# Patient Record
Sex: Female | Born: 1935 | Race: White | Hispanic: No | Smoking: Current every day smoker
Health system: Southern US, Community
[De-identification: ages and names within clinical notes are randomized; demographics above are authoritative.]

## PROBLEM LIST (undated history)

## (undated) DIAGNOSIS — F17200 Nicotine dependence, unspecified, uncomplicated: Secondary | ICD-10-CM

## (undated) DIAGNOSIS — I10 Essential (primary) hypertension: Secondary | ICD-10-CM

## (undated) DIAGNOSIS — Z8489 Family history of other specified conditions: Secondary | ICD-10-CM

## (undated) DIAGNOSIS — N6320 Unspecified lump in the left breast, unspecified quadrant: Secondary | ICD-10-CM

## (undated) DIAGNOSIS — C4492 Squamous cell carcinoma of skin, unspecified: Secondary | ICD-10-CM

## (undated) HISTORY — PX: APPENDECTOMY: SHX54

## (undated) HISTORY — PX: EYE SURGERY: SHX253

## (undated) HISTORY — PX: COLONOSCOPY: SHX174

## (undated) HISTORY — DX: Squamous cell carcinoma of skin, unspecified: C44.92

## (undated) HISTORY — PX: CAROTID ENDARTERECTOMY: SUR193

---

## 2004-11-29 ENCOUNTER — Ambulatory Visit: Payer: Self-pay | Admitting: Internal Medicine

## 2006-01-15 ENCOUNTER — Ambulatory Visit: Payer: Self-pay | Admitting: Internal Medicine

## 2007-02-04 ENCOUNTER — Ambulatory Visit: Payer: Self-pay | Admitting: Internal Medicine

## 2007-03-26 ENCOUNTER — Ambulatory Visit: Payer: Self-pay | Admitting: Unknown Physician Specialty

## 2008-02-08 ENCOUNTER — Ambulatory Visit: Payer: Self-pay | Admitting: Internal Medicine

## 2009-03-05 ENCOUNTER — Ambulatory Visit: Payer: Self-pay | Admitting: Internal Medicine

## 2009-06-20 ENCOUNTER — Ambulatory Visit: Payer: Self-pay | Admitting: Unknown Physician Specialty

## 2009-08-27 ENCOUNTER — Ambulatory Visit: Payer: Self-pay | Admitting: Surgery

## 2009-09-03 ENCOUNTER — Inpatient Hospital Stay: Payer: Self-pay | Admitting: Surgery

## 2009-09-05 LAB — PATHOLOGY REPORT

## 2010-01-11 ENCOUNTER — Ambulatory Visit: Payer: Self-pay | Admitting: Internal Medicine

## 2010-01-17 ENCOUNTER — Ambulatory Visit: Payer: Self-pay | Admitting: Vascular Surgery

## 2010-03-06 ENCOUNTER — Ambulatory Visit: Payer: Self-pay | Admitting: Internal Medicine

## 2010-04-15 ENCOUNTER — Ambulatory Visit: Payer: Self-pay | Admitting: Vascular Surgery

## 2010-04-18 ENCOUNTER — Inpatient Hospital Stay: Payer: Self-pay | Admitting: Vascular Surgery

## 2010-04-19 LAB — PATHOLOGY REPORT

## 2011-03-31 ENCOUNTER — Ambulatory Visit: Payer: Self-pay | Admitting: Internal Medicine

## 2011-06-06 ENCOUNTER — Ambulatory Visit: Payer: Self-pay | Admitting: Unknown Physician Specialty

## 2011-06-12 ENCOUNTER — Ambulatory Visit: Payer: Self-pay | Admitting: Unknown Physician Specialty

## 2011-06-17 ENCOUNTER — Ambulatory Visit: Payer: Self-pay | Admitting: Unknown Physician Specialty

## 2011-09-25 ENCOUNTER — Ambulatory Visit: Payer: Self-pay | Admitting: Unknown Physician Specialty

## 2011-09-26 LAB — PATHOLOGY REPORT

## 2011-11-11 ENCOUNTER — Ambulatory Visit: Payer: Self-pay | Admitting: Ophthalmology

## 2011-11-24 ENCOUNTER — Ambulatory Visit: Payer: Self-pay | Admitting: Ophthalmology

## 2012-03-31 ENCOUNTER — Ambulatory Visit: Payer: Self-pay | Admitting: Internal Medicine

## 2013-04-01 ENCOUNTER — Ambulatory Visit: Payer: Self-pay | Admitting: Family Medicine

## 2014-01-13 HISTORY — PX: BREAST EXCISIONAL BIOPSY: SUR124

## 2014-02-21 DIAGNOSIS — Z720 Tobacco use: Secondary | ICD-10-CM | POA: Insufficient documentation

## 2014-04-10 ENCOUNTER — Ambulatory Visit: Payer: Self-pay | Admitting: Family Medicine

## 2014-04-19 ENCOUNTER — Ambulatory Visit
Admit: 2014-04-19 | Disposition: A | Discharge status: Home / Self Care | Payer: Self-pay | Attending: Family Medicine | Admitting: Family Medicine

## 2014-04-20 ENCOUNTER — Other Ambulatory Visit: Payer: Self-pay | Admitting: Family Medicine

## 2014-04-20 DIAGNOSIS — N63 Unspecified lump in unspecified breast: Secondary | ICD-10-CM

## 2014-05-02 NOTE — Op Note (Signed)
PATIENT NAME:  Tina Ingram, Tina Ingram MR#:  [id number] DATE OF BIRTH:  1934/04/09  DATE OF PROCEDURE:  11/24/2011  PREOPERATIVE DIAGNOSIS:  Cataract, left eye.    POSTOPERATIVE DIAGNOSIS:  Cataract, left eye.  PROCEDURE PERFORMED:  Extracapsular cataract extraction using phacoemulsification with placement of an Alcon [id number], 24.0-diopter posterior chamber lens, serial # [id number].  SURGEON:  Loura Back. Nyzier Boivin, MD  ASSISTANT:  None.  ANESTHESIA:  4% lidocaine and 0.75% Marcaine in a 50/50 mixture with 10 units/mL of Hylenex added, given as peribulbar.  ANESTHESIOLOGIST:  Dr. Benjamine Mola   COMPLICATIONS:  None.  ESTIMATED BLOOD LOSS:  Less than 1 mL.  DESCRIPTION OF PROCEDURE:  The patient was brought to the operating room and given a peribulbar block.  The patient was then prepped and draped in the usual fashion.  The vertical rectus muscles were imbricated using 5-0 silk sutures.  These sutures were then clamped to the sterile drapes as bridle sutures.  A limbal peritomy was performed extending two clock hours and hemostasis was obtained with cautery.  A partial thickness scleral groove was made at the surgical limbus and dissected anteriorly in a lamellar dissection using an Alcon crescent knife.  The anterior chamber was entered supero-temporally with a Superblade and through the lamellar dissection with a 2.6 mm keratome.  DisCoVisc was used to replace the aqueous and a continuous tear capsulorrhexis was carried out.  Hydrodissection and hydrodelineation were carried out with balanced salt and a 27 gauge canula.  The nucleus was rotated to confirm the effectiveness of the hydrodissection.  Phacoemulsification was carried out using a divide-and-conquer technique.  Total ultrasound time was 1 minute and 35 seconds with an average power of 19.2 percent, CDE 31.39.  Irrigation/aspiration was used to remove the residual cortex.  DisCoVisc was used to inflate the capsule and the internal incision was  enlarged to 3 mm with the crescent knife.  The intraocular lens was folded and inserted into the capsular bag using the AcrySert delivery system.  Irrigation/aspiration was used to remove the residual DisCoVisc.  Miostat was injected into the anterior chamber through the paracentesis track to inflate the anterior chamber and induce miosis.  The wound was checked for leaks and none were found. The conjunctiva was closed with cautery and the bridle sutures were removed.  Two drops of 0.3% Vigamox were placed on the eye.   An eye shield was placed on the eye.  The patient was discharged to the recovery room in good condition.  ____________________________ Loura Back Lavayah Vita, MD sad:drc D: 11/24/2011 13:13:51 ET T: 11/24/2011 13:24:24 ET JOB#: [id number]  cc: Remo Lipps A. Dominigue Gellner, MD, <Dictator> Martie Lee MD ELECTRONICALLY SIGNED 12/08/2011 14:39

## 2014-05-11 ENCOUNTER — Other Ambulatory Visit: Payer: Self-pay | Admitting: Family Medicine

## 2014-05-11 DIAGNOSIS — N63 Unspecified lump in unspecified breast: Secondary | ICD-10-CM

## 2014-05-16 ENCOUNTER — Ambulatory Visit
Admission: RE | Admit: 2014-05-16 | Discharge: 2014-05-16 | Disposition: A | Discharge status: Home / Self Care | Payer: Medicare Other | Source: Ambulatory Visit | Attending: Family Medicine | Admitting: Family Medicine

## 2014-05-16 ENCOUNTER — Other Ambulatory Visit: Payer: Self-pay | Admitting: Family Medicine

## 2014-05-16 DIAGNOSIS — N63 Unspecified lump in unspecified breast: Secondary | ICD-10-CM

## 2014-05-16 HISTORY — PX: BREAST BIOPSY: SHX20

## 2014-05-22 ENCOUNTER — Ambulatory Visit: Payer: Self-pay | Admitting: Surgery

## 2014-05-22 DIAGNOSIS — N6320 Unspecified lump in the left breast, unspecified quadrant: Secondary | ICD-10-CM

## 2014-05-22 NOTE — H&P (Signed)
atsye S. Steinert 05/22/2014 10:28 AM Location: [facility] Patient #: [id number] DOB: January 24, 1934 Single / Language: Cleophus Molt / Race: White Female History of Present Illness Marcello Moores A. Trygg Mantz MD; 05/22/2014 11:50 AM) Patient words: check breast lesion  Pt sent at the request of Dr Owens Shark for left breast lesion picked up on mammogram. Lesion biopsy showed complex sclerosing lesion. No other breast problems except some burning. Has had one previous biopsy. no mass or nipple discharge from either side.    CLINICAL DATA: Status post tomosynthesis guided biopsy of left breast distortion.  EXAM: DIAGNOSTIC LEFT MAMMOGRAM POST ULTRASOUND BIOPSY  DIGITAL BREAST TOMOSYNTHESIS  COMPARISON: Previous exams  FINDINGS: Mammographic images were obtained following tomosynthesis guided biopsy of distortion in the upper inner quadrant of the left breast. An X shaped clip is identified in the area of distortion following biopsy.  IMPRESSION: Tissue marker clip is in expected location following biopsy.  Final Assessment: Post Procedure Mammograms for Marker Placement Pathology revealed a complex sclerosing lesion in the left breast. This was found to be concordant by Dr. Shon Hale. Pathology was discussed with the patient by telephone. She reported doing well after the biopsy with tenderness at the site. Post biopsy instructions and care were reviewed and her questions were answered. At the request of the patient, surgical consultation was arranged in [place]. She has been scheduled to see Dr. Erroll Luna at [facility] on May 22, 2014. My number was provided for additional concerns or questions.  Pathology results reported by Susa Raring RN, BSN on May 18, 2014.   Electronically Signed By: Nolon Nations M.D. On: 05/18/2014 08:03          Breast, left, needle core biopsy, upper inner quadrant - COMPLEX SCLEROSING LESION, SEE  COMMENT. - CALCIFICATIONS.  The patient is a 80 year old female   Other Problems Marjean Donna, [place]; 05/22/2014 10:28 AM) Back Pain High blood pressure  Past Surgical History Marjean Donna, CMA; 05/22/2014 10:28 AM) Appendectomy Breast Biopsy Left. Colon Polyp Removal - Colonoscopy Colon Polyp Removal - Open Oral Surgery  Diagnostic Studies History Marjean Donna, CMA; 05/22/2014 10:28 AM) Colonoscopy 1-5 years ago Mammogram within last year  Allergies Marjean Donna, [place]; 05/22/2014 10:29 AM) No Known Drug Allergies 05/22/2014  Medication History (Sonya Bynum, CMA; 05/22/2014 10:35 AM) Aspirin EC (81MG  Tablet DR, Oral) Active. Doxycycline Hyclate (100MG  Capsule, Oral) Active. Medications Reconciled  Social History Marjean Donna, CMA; 05/22/2014 10:28 AM) Caffeine use Coffee, Tea. No alcohol use No drug use Tobacco use Current every day smoker.  Family History Marjean Donna, CMA; 05/22/2014 10:28 AM) Cancer Father. Hypertension Father. Respiratory Condition Father.  Pregnancy / Birth History Marjean Donna, [place]; 05/22/2014 10:28 AM) Age at menarche 58 years. Gravida 2 Maternal age 17-25 Para 2     Review of Systems ([place]; 05/22/2014 10:28 AM) General Not Present- Appetite Loss, Chills, Fatigue, Fever, Night Sweats, Weight Gain and Weight Loss. Skin Not Present- Change in Wart/Mole, Dryness, Hives, Jaundice, New Lesions, Non-Healing Wounds, Rash and Ulcer. HEENT Not Present- Earache, Hearing Loss, Hoarseness, Nose Bleed, Oral Ulcers, Ringing in the Ears, Seasonal Allergies, Sinus Pain, Sore Throat, Visual Disturbances, Wears glasses/contact lenses and Yellow Eyes. Respiratory Not Present- Bloody sputum, Chronic Cough, Difficulty Breathing, Snoring and Wheezing. Breast Present- Breast Mass. Not Present- Breast Pain, Nipple Discharge and Skin Changes. Cardiovascular Not Present- Chest Pain, Difficulty Breathing Lying Down, Leg Cramps, Palpitations, Rapid  Heart Rate, Shortness of Breath and Swelling of Extremities. Gastrointestinal Not Present-  Abdominal Pain, Bloating, Bloody Stool, Change in Bowel Habits, Chronic diarrhea, Constipation, Difficulty Swallowing, Excessive gas, Gets full quickly at meals, Hemorrhoids, Indigestion, Nausea, Rectal Pain and Vomiting. Female Genitourinary Not Present- Frequency, Nocturia, Painful Urination, Pelvic Pain and Urgency. Musculoskeletal Present- Joint Stiffness. Not Present- Back Pain, Joint Pain, Muscle Pain, Muscle Weakness and Swelling of Extremities. Neurological Not Present- Decreased Memory, Fainting, Headaches, Numbness, Seizures, Tingling, Tremor, Trouble walking and Weakness. Psychiatric Not Present- Anxiety, Bipolar, Change in Sleep Pattern, Depression, Fearful and Frequent crying. Endocrine Not Present- Cold Intolerance, Excessive Hunger, Hair Changes, Heat Intolerance, Hot flashes and New Diabetes. Hematology Present- Easy Bruising. Not Present- Excessive bleeding, Gland problems, HIV and Persistent Infections.  Vitals (Sonya Bynum CMA; 05/22/2014 10:29 AM) 05/22/2014 10:28 AM Weight: 129.2 lb Height: 62in Body Surface Area: 1.6 m Body Mass Index: 23.63 kg/m Temp.: 32F(Temporal)  Pulse: 92 (Regular)  BP: 142/76 (Sitting, Left Arm, Standard)     Physical Exam (Jontae Adebayo A. Duke Weisensel MD; 05/22/2014 10:53 AM)  General Mental Status-Alert. General Appearance-Consistent with stated age. Hydration-Well hydrated. Voice-Normal.  Head and Neck Head-normocephalic, atraumatic with no lesions or palpable masses. Trachea-midline. Thyroid Gland Characteristics - normal size and consistency.  Chest and Lung Exam Chest and lung exam reveals -quiet, even and easy respiratory effort with no use of accessory muscles and on auscultation, normal breath sounds, no adventitious sounds and normal vocal resonance. Inspection Chest Wall - Normal. Back - normal.  Breast Note: bruising  left breast no mass  rigt breast normal   Cardiovascular Cardiovascular examination reveals -normal heart sounds, regular rate and rhythm with no murmurs and normal pedal pulses bilaterally.  Musculoskeletal Normal Exam - Left-Upper Extremity Strength Normal and Lower Extremity Strength Normal. Normal Exam - Right-Upper Extremity Strength Normal and Lower Extremity Strength Normal.  Lymphatic Axillary  General Axillary Region: Bilateral - Description - Normal. Tenderness - Non Tender.    Assessment & Plan (Saranda Legrande A. Felise Georgia MD; 05/22/2014 11:50 AM)  LEFT BREAST MASS (611.72  N63) Impression: sclerosing lesion carries small risk of malignancy. Discussed lumpectomy vs observation and risk benefit of each. She wishes to proceed with left breast seed localized lumpectomy. Risk of lumpectomy include bleeding, infection, seroma, more surgery, use of seed/wire, wound care, cosmetic deformity and the need for other treatments, death , blood clots, death. Pt agrees to proceed.  Current Plans Pt Education - [facility] Breast Biopsy [facility]

## 2014-05-25 ENCOUNTER — Other Ambulatory Visit: Payer: Self-pay | Admitting: Surgery

## 2014-05-25 DIAGNOSIS — N6320 Unspecified lump in the left breast, unspecified quadrant: Secondary | ICD-10-CM

## 2014-06-16 ENCOUNTER — Encounter (HOSPITAL_BASED_OUTPATIENT_CLINIC_OR_DEPARTMENT_OTHER): Payer: Self-pay | Admitting: *Deleted

## 2014-06-19 ENCOUNTER — Encounter (HOSPITAL_BASED_OUTPATIENT_CLINIC_OR_DEPARTMENT_OTHER)
Admission: RE | Admit: 2014-06-19 | Discharge: 2014-06-19 | Disposition: A | Discharge status: Home / Self Care | Payer: Medicare Other | Source: Ambulatory Visit | Attending: Surgery | Admitting: Surgery

## 2014-06-19 ENCOUNTER — Ambulatory Visit
Admission: RE | Admit: 2014-06-19 | Discharge: 2014-06-19 | Disposition: A | Discharge status: Home / Self Care | Payer: Medicare Other | Source: Ambulatory Visit | Attending: Surgery | Admitting: Surgery

## 2014-06-19 ENCOUNTER — Other Ambulatory Visit: Payer: Self-pay

## 2014-06-19 DIAGNOSIS — I10 Essential (primary) hypertension: Secondary | ICD-10-CM | POA: Diagnosis not present

## 2014-06-19 DIAGNOSIS — F17200 Nicotine dependence, unspecified, uncomplicated: Secondary | ICD-10-CM | POA: Diagnosis not present

## 2014-06-19 DIAGNOSIS — N6489 Other specified disorders of breast: Secondary | ICD-10-CM | POA: Diagnosis not present

## 2014-06-19 DIAGNOSIS — Z7982 Long term (current) use of aspirin: Secondary | ICD-10-CM | POA: Diagnosis not present

## 2014-06-19 DIAGNOSIS — N63 Unspecified lump in breast: Secondary | ICD-10-CM | POA: Diagnosis present

## 2014-06-19 DIAGNOSIS — Z79899 Other long term (current) drug therapy: Secondary | ICD-10-CM | POA: Diagnosis not present

## 2014-06-19 DIAGNOSIS — I739 Peripheral vascular disease, unspecified: Secondary | ICD-10-CM | POA: Diagnosis not present

## 2014-06-19 DIAGNOSIS — N6320 Unspecified lump in the left breast, unspecified quadrant: Secondary | ICD-10-CM

## 2014-06-19 LAB — COMPREHENSIVE METABOLIC PANEL
ALK PHOS: 69 U/L (ref 38–126)
ALT: 11 U/L — AB (ref 14–54)
AST: 17 U/L (ref 15–41)
Albumin: 3.7 g/dL (ref 3.5–5.0)
Anion gap: 11 (ref 5–15)
BUN: 14 mg/dL (ref 6–20)
CO2: 29 mmol/L (ref 22–32)
Calcium: 9.2 mg/dL (ref 8.9–10.3)
Chloride: 98 mmol/L — ABNORMAL LOW (ref 101–111)
Creatinine, Ser: 1.03 mg/dL — ABNORMAL HIGH (ref 0.44–1.00)
GFR, EST AFRICAN AMERICAN: 59 mL/min — AB (ref >60)
GFR, EST NON AFRICAN AMERICAN: 51 mL/min — AB (ref >60)
GLUCOSE: 112 mg/dL — AB (ref 65–99)
Potassium: 3.1 mmol/L — ABNORMAL LOW (ref 3.5–5.1)
SODIUM: 138 mmol/L (ref 135–145)
TOTAL PROTEIN: 6.2 g/dL — AB (ref 6.5–8.1)
Total Bilirubin: 1.3 mg/dL — ABNORMAL HIGH (ref 0.3–1.2)

## 2014-06-19 LAB — CBC WITH DIFFERENTIAL/PLATELET
BASOS ABS: 0 10*3/uL (ref 0.0–0.1)
Basophils Relative: 0 % (ref 0–1)
EOS ABS: 0.1 10*3/uL (ref 0.0–0.7)
EOS PCT: 2 % (ref 0–5)
HEMATOCRIT: 43.5 % (ref 36.0–46.0)
Hemoglobin: 14.5 g/dL (ref 12.0–15.0)
Lymphocytes Relative: 21 % (ref 12–46)
Lymphs Abs: 1.5 10*3/uL (ref 0.7–4.0)
MCH: 30.4 pg (ref 26.0–34.0)
MCHC: 33.3 g/dL (ref 30.0–36.0)
MCV: 91.2 fL (ref 78.0–100.0)
Monocytes Absolute: 0.4 10*3/uL (ref 0.1–1.0)
Monocytes Relative: 5 % (ref 3–12)
NEUTROS PCT: 72 % (ref 43–77)
Neutro Abs: 5.2 10*3/uL (ref 1.7–7.7)
Platelets: 265 10*3/uL (ref 150–400)
RBC: 4.77 MIL/uL (ref 3.87–5.11)
RDW: 13.1 % (ref 11.5–15.5)
WBC: 7.2 10*3/uL (ref 4.0–10.5)

## 2014-06-20 ENCOUNTER — Ambulatory Visit (HOSPITAL_BASED_OUTPATIENT_CLINIC_OR_DEPARTMENT_OTHER): Payer: Medicare Other | Admitting: Anesthesiology

## 2014-06-20 ENCOUNTER — Encounter (HOSPITAL_BASED_OUTPATIENT_CLINIC_OR_DEPARTMENT_OTHER): Payer: Self-pay | Admitting: Anesthesiology

## 2014-06-20 ENCOUNTER — Encounter (HOSPITAL_BASED_OUTPATIENT_CLINIC_OR_DEPARTMENT_OTHER)
Admission: RE | Disposition: A | Discharge status: Home / Self Care | Payer: Self-pay | Source: Ambulatory Visit | Attending: Surgery

## 2014-06-20 ENCOUNTER — Ambulatory Visit (HOSPITAL_BASED_OUTPATIENT_CLINIC_OR_DEPARTMENT_OTHER)
Admission: RE | Admit: 2014-06-20 | Discharge: 2014-06-20 | Disposition: A | Discharge status: Home / Self Care | Payer: Medicare Other | Source: Ambulatory Visit | Attending: Surgery | Admitting: Surgery

## 2014-06-20 ENCOUNTER — Ambulatory Visit
Admission: RE | Admit: 2014-06-20 | Discharge: 2014-06-20 | Disposition: A | Discharge status: Home / Self Care | Payer: Medicare Other | Source: Ambulatory Visit | Attending: Surgery | Admitting: Surgery

## 2014-06-20 DIAGNOSIS — I10 Essential (primary) hypertension: Secondary | ICD-10-CM | POA: Insufficient documentation

## 2014-06-20 DIAGNOSIS — F17200 Nicotine dependence, unspecified, uncomplicated: Secondary | ICD-10-CM | POA: Insufficient documentation

## 2014-06-20 DIAGNOSIS — N6489 Other specified disorders of breast: Secondary | ICD-10-CM | POA: Insufficient documentation

## 2014-06-20 DIAGNOSIS — Z79899 Other long term (current) drug therapy: Secondary | ICD-10-CM | POA: Insufficient documentation

## 2014-06-20 DIAGNOSIS — Z7982 Long term (current) use of aspirin: Secondary | ICD-10-CM | POA: Insufficient documentation

## 2014-06-20 DIAGNOSIS — I739 Peripheral vascular disease, unspecified: Secondary | ICD-10-CM | POA: Diagnosis not present

## 2014-06-20 DIAGNOSIS — N6320 Unspecified lump in the left breast, unspecified quadrant: Secondary | ICD-10-CM

## 2014-06-20 HISTORY — PX: BREAST LUMPECTOMY WITH RADIOACTIVE SEED LOCALIZATION: SHX6424

## 2014-06-20 HISTORY — DX: Essential (primary) hypertension: I10

## 2014-06-20 HISTORY — DX: Unspecified lump in the left breast, unspecified quadrant: N63.20

## 2014-06-20 HISTORY — DX: Nicotine dependence, unspecified, uncomplicated: F17.200

## 2014-06-20 SURGERY — BREAST LUMPECTOMY WITH RADIOACTIVE SEED LOCALIZATION
Anesthesia: General | Laterality: Left

## 2014-06-20 MED ORDER — OXYCODONE HCL 5 MG PO TABS: 5.0000 mg | ORAL_TABLET | Freq: Once | ORAL | Status: DC | PRN

## 2014-06-20 MED ORDER — HYDROCODONE-ACETAMINOPHEN 5-325 MG PO TABS: 1.0000 | ORAL_TABLET | Freq: Four times a day (QID) | ORAL | Status: DC | PRN

## 2014-06-20 MED ORDER — BUPIVACAINE-EPINEPHRINE (PF) 0.25% -1:200000 IJ SOLN
INTRAMUSCULAR | Status: DC | PRN
  Administered 2014-06-20: 9 mL

## 2014-06-20 MED ORDER — GLYCOPYRROLATE 0.2 MG/ML IJ SOLN: 0.2000 mg | Freq: Once | INTRAMUSCULAR | Status: DC | PRN

## 2014-06-20 MED ORDER — CEFAZOLIN SODIUM-DEXTROSE 2-3 GM-% IV SOLR
2.0000 g | INTRAVENOUS | Status: AC
  Administered 2014-06-20: 2 g via INTRAVENOUS

## 2014-06-20 MED ORDER — PROPOFOL 10 MG/ML IV BOLUS
INTRAVENOUS | Status: DC | PRN
  Administered 2014-06-20: 150 mg via INTRAVENOUS

## 2014-06-20 MED ORDER — MIDAZOLAM HCL 2 MG/2ML IJ SOLN: 1.0000 mg | INTRAMUSCULAR | Status: DC | PRN

## 2014-06-20 MED ORDER — CHLORHEXIDINE GLUCONATE 4 % EX LIQD: 1.0000 "application " | Freq: Once | CUTANEOUS | Status: DC

## 2014-06-20 MED ORDER — DEXAMETHASONE SODIUM PHOSPHATE 4 MG/ML IJ SOLN
INTRAMUSCULAR | Status: DC | PRN
  Administered 2014-06-20: 10 mg via INTRAVENOUS

## 2014-06-20 MED ORDER — OXYCODONE HCL 5 MG/5ML PO SOLN: 5.0000 mg | Freq: Once | ORAL | Status: DC | PRN

## 2014-06-20 MED ORDER — MIDAZOLAM HCL 2 MG/2ML IJ SOLN
INTRAMUSCULAR | Status: AC
  Filled 2014-06-20: qty 2

## 2014-06-20 MED ORDER — EPHEDRINE SULFATE 50 MG/ML IJ SOLN
INTRAMUSCULAR | Status: DC | PRN
  Administered 2014-06-20 (×2): 10 mg via INTRAVENOUS

## 2014-06-20 MED ORDER — FENTANYL CITRATE (PF) 100 MCG/2ML IJ SOLN: 25.0000 ug | INTRAMUSCULAR | Status: DC | PRN

## 2014-06-20 MED ORDER — CEFAZOLIN SODIUM-DEXTROSE 2-3 GM-% IV SOLR
INTRAVENOUS | Status: AC
  Filled 2014-06-20: qty 50

## 2014-06-20 MED ORDER — FENTANYL CITRATE (PF) 100 MCG/2ML IJ SOLN
INTRAMUSCULAR | Status: AC
  Filled 2014-06-20: qty 4

## 2014-06-20 MED ORDER — FENTANYL CITRATE (PF) 100 MCG/2ML IJ SOLN: 50.0000 ug | INTRAMUSCULAR | Status: DC | PRN

## 2014-06-20 MED ORDER — ONDANSETRON HCL 4 MG/2ML IJ SOLN: 4.0000 mg | Freq: Four times a day (QID) | INTRAMUSCULAR | Status: DC | PRN

## 2014-06-20 MED ORDER — FENTANYL CITRATE (PF) 100 MCG/2ML IJ SOLN
INTRAMUSCULAR | Status: DC | PRN
  Administered 2014-06-20: 100 ug via INTRAVENOUS

## 2014-06-20 MED ORDER — LACTATED RINGERS IV SOLN
INTRAVENOUS | Status: DC
Start: 2014-06-20 — End: 2014-06-20
  Administered 2014-06-20 (×2): via INTRAVENOUS

## 2014-06-20 MED ORDER — ONDANSETRON HCL 4 MG/2ML IJ SOLN
INTRAMUSCULAR | Status: DC | PRN
  Administered 2014-06-20: 4 mg via INTRAVENOUS

## 2014-06-20 MED ORDER — LIDOCAINE HCL (CARDIAC) 20 MG/ML IV SOLN
INTRAVENOUS | Status: DC | PRN
  Administered 2014-06-20: 60 mg via INTRAVENOUS

## 2014-06-20 SURGICAL SUPPLY — 48 items
APPLIER CLIP 9.375 MED OPEN (MISCELLANEOUS)
BINDER BREAST LRG (GAUZE/BANDAGES/DRESSINGS) IMPLANT
BINDER BREAST MEDIUM (GAUZE/BANDAGES/DRESSINGS) IMPLANT
BINDER BREAST XLRG (GAUZE/BANDAGES/DRESSINGS) IMPLANT
BINDER BREAST XXLRG (GAUZE/BANDAGES/DRESSINGS) IMPLANT
BLADE SURG 15 STRL LF DISP TIS (BLADE) ×1 IMPLANT
BLADE SURG 15 STRL SS (BLADE) ×1
CANISTER SUC SOCK COL 7IN (MISCELLANEOUS) IMPLANT
CANISTER SUCT 1200ML W/VALVE (MISCELLANEOUS) IMPLANT
CHLORAPREP W/TINT 26ML (MISCELLANEOUS) ×2 IMPLANT
CLIP APPLIE 9.375 MED OPEN (MISCELLANEOUS) IMPLANT
CLIP TI WIDE RED SMALL 6 (CLIP) IMPLANT
COVER BACK TABLE 60X90IN (DRAPES) ×2 IMPLANT
COVER MAYO STAND STRL (DRAPES) ×2 IMPLANT
COVER PROBE W GEL 5X96 (DRAPES) ×2 IMPLANT
DECANTER SPIKE VIAL GLASS SM (MISCELLANEOUS) IMPLANT
DEVICE DUBIN W/COMP PLATE 8390 (MISCELLANEOUS) ×2 IMPLANT
DRAPE LAPAROSCOPIC ABDOMINAL (DRAPES) IMPLANT
DRAPE LAPAROTOMY 100X72 PEDS (DRAPES) ×2 IMPLANT
DRAPE UTILITY XL STRL (DRAPES) ×2 IMPLANT
ELECT COATED BLADE 2.86 ST (ELECTRODE) ×2 IMPLANT
ELECT REM PT RETURN 9FT ADLT (ELECTROSURGICAL) ×2
ELECTRODE REM PT RTRN 9FT ADLT (ELECTROSURGICAL) ×1 IMPLANT
GLOVE BIOGEL PI IND STRL 6.5 (GLOVE) ×2 IMPLANT
GLOVE BIOGEL PI IND STRL 8 (GLOVE) ×1 IMPLANT
GLOVE BIOGEL PI INDICATOR 6.5 (GLOVE) ×2
GLOVE BIOGEL PI INDICATOR 8 (GLOVE) ×1
GLOVE ECLIPSE 6.5 STRL STRAW (GLOVE) ×2 IMPLANT
GLOVE ECLIPSE 8.0 STRL XLNG CF (GLOVE) ×2 IMPLANT
GOWN STRL REUS W/ TWL LRG LVL3 (GOWN DISPOSABLE) ×2 IMPLANT
GOWN STRL REUS W/TWL LRG LVL3 (GOWN DISPOSABLE) ×2
HEMOSTAT SNOW SURGICEL 2X4 (HEMOSTASIS) IMPLANT
KIT MARKER MARGIN INK (KITS) ×2 IMPLANT
LIQUID BAND (GAUZE/BANDAGES/DRESSINGS) ×2 IMPLANT
NEEDLE HYPO 25X1 1.5 SAFETY (NEEDLE) ×2 IMPLANT
NS IRRIG 1000ML POUR BTL (IV SOLUTION) IMPLANT
PACK BASIN DAY SURGERY FS (CUSTOM PROCEDURE TRAY) ×2 IMPLANT
PENCIL BUTTON HOLSTER BLD 10FT (ELECTRODE) ×2 IMPLANT
SLEEVE SCD COMPRESS KNEE MED (MISCELLANEOUS) ×2 IMPLANT
SPONGE LAP 4X18 X RAY DECT (DISPOSABLE) ×2 IMPLANT
SUT MNCRL AB 4-0 PS2 18 (SUTURE) ×2 IMPLANT
SUT SILK 2 0 SH (SUTURE) IMPLANT
SUT VICRYL 3-0 CR8 SH (SUTURE) ×2 IMPLANT
SYR CONTROL 10ML LL (SYRINGE) ×2 IMPLANT
TOWEL OR 17X24 6PK STRL BLUE (TOWEL DISPOSABLE) ×2 IMPLANT
TOWEL OR NON WOVEN STRL DISP B (DISPOSABLE) ×2 IMPLANT
TUBE CONNECTING 20X1/4 (TUBING) IMPLANT
YANKAUER SUCT BULB TIP NO VENT (SUCTIONS) IMPLANT

## 2014-06-20 NOTE — H&P (View-Only) (Signed)
Tina Ingram 05/22/2014 10:28 AM Location: [facility] Patient #: [id number] DOB: [birth year] Single / Language: Cleophus Molt / Race: White Female History of Present Illness Marcello Moores A. Kameria Canizares MD; 05/22/2014 11:50 AM) Patient words: check breast lesion  Pt sent at the request of Dr Owens Shark for left breast lesion picked up on mammogram. Lesion biopsy showed complex sclerosing lesion. No other breast problems except some burning. Has had one previous biopsy. no mass or nipple discharge from either side.    CLINICAL DATA: Status post tomosynthesis guided biopsy of left breast distortion.  EXAM: DIAGNOSTIC LEFT MAMMOGRAM POST ULTRASOUND BIOPSY  DIGITAL BREAST TOMOSYNTHESIS  COMPARISON: Previous exams  FINDINGS: Mammographic images were obtained following tomosynthesis guided biopsy of distortion in the upper inner quadrant of the left breast. An X shaped clip is identified in the area of distortion following biopsy.  IMPRESSION: Tissue marker clip is in expected location following biopsy.  Final Assessment: Post Procedure Mammograms for Marker Placement Pathology revealed a complex sclerosing lesion in the left breast. This was found to be concordant by Dr. Shon Hale. Pathology was discussed with the patient by telephone. She reported doing well after the biopsy with tenderness at the site. Post biopsy instructions and care were reviewed and her questions were answered. At the request of the patient, surgical consultation was arranged in [place]. She has been scheduled to see Dr. Erroll Luna at [facility] on May 22, 2014. My number was provided for additional concerns or questions.  Pathology results reported by Susa Raring RN, BSN on May 18, 2014.   Electronically Signed By: Nolon Nations M.D. On: 05/18/2014 08:03          Breast, left, needle core biopsy, upper inner quadrant - COMPLEX SCLEROSING LESION, SEE  COMMENT. - CALCIFICATIONS.  The patient is a 80 year old female   Other Problems Marjean Donna, [place]; 05/22/2014 10:28 AM) Back Pain High blood pressure  Past Surgical History Marjean Donna, CMA; 05/22/2014 10:28 AM) Appendectomy Breast Biopsy Left. Colon Polyp Removal - Colonoscopy Colon Polyp Removal - Open Oral Surgery  Diagnostic Studies History Marjean Donna, CMA; 05/22/2014 10:28 AM) Colonoscopy 1-5 years ago Mammogram within last year  Allergies Marjean Donna, [place]; 05/22/2014 10:29 AM) No Known Drug Allergies 05/22/2014  Medication History (Sonya Bynum, CMA; 05/22/2014 10:35 AM) Aspirin EC (81MG  Tablet DR, Oral) Active. Doxycycline Hyclate (100MG  Capsule, Oral) Active. Medications Reconciled  Social History Marjean Donna, CMA; 05/22/2014 10:28 AM) Caffeine use Coffee, Tea. No alcohol use No drug use Tobacco use Current every day smoker.  Family History Marjean Donna, CMA; 05/22/2014 10:28 AM) Cancer Father. Hypertension Father. Respiratory Condition Father.  Pregnancy / Birth History Marjean Donna, [place]; 05/22/2014 10:28 AM) Age at menarche 68 years. Gravida 2 Maternal age 20-25 Para 2     Review of Systems ([place]; 05/22/2014 10:28 AM) General Not Present- Appetite Loss, Chills, Fatigue, Fever, Night Sweats, Weight Gain and Weight Loss. Skin Not Present- Change in Wart/Mole, Dryness, Hives, Jaundice, New Lesions, Non-Healing Wounds, Rash and Ulcer. HEENT Not Present- Earache, Hearing Loss, Hoarseness, Nose Bleed, Oral Ulcers, Ringing in the Ears, Seasonal Allergies, Sinus Pain, Sore Throat, Visual Disturbances, Wears glasses/contact lenses and Yellow Eyes. Respiratory Not Present- Bloody sputum, Chronic Cough, Difficulty Breathing, Snoring and Wheezing. Breast Present- Breast Mass. Not Present- Breast Pain, Nipple Discharge and Skin Changes. Cardiovascular Not Present- Chest Pain, Difficulty Breathing Lying Down, Leg Cramps, Palpitations, Rapid  Heart Rate, Shortness of Breath and Swelling of Extremities. Gastrointestinal Not Present-  Abdominal Pain, Bloating, Bloody Stool, Change in Bowel Habits, Chronic diarrhea, Constipation, Difficulty Swallowing, Excessive gas, Gets full quickly at meals, Hemorrhoids, Indigestion, Nausea, Rectal Pain and Vomiting. Female Genitourinary Not Present- Frequency, Nocturia, Painful Urination, Pelvic Pain and Urgency. Musculoskeletal Present- Joint Stiffness. Not Present- Back Pain, Joint Pain, Muscle Pain, Muscle Weakness and Swelling of Extremities. Neurological Not Present- Decreased Memory, Fainting, Headaches, Numbness, Seizures, Tingling, Tremor, Trouble walking and Weakness. Psychiatric Not Present- Anxiety, Bipolar, Change in Sleep Pattern, Depression, Fearful and Frequent crying. Endocrine Not Present- Cold Intolerance, Excessive Hunger, Hair Changes, Heat Intolerance, Hot flashes and New Diabetes. Hematology Present- Easy Bruising. Not Present- Excessive bleeding, Gland problems, HIV and Persistent Infections.  Vitals (Sonya Bynum CMA; 05/22/2014 10:29 AM) 05/22/2014 10:28 AM Weight: 129.2 lb Height: 62in Body Surface Area: 1.6 m Body Mass Index: 23.63 kg/m Temp.: 20F(Temporal)  Pulse: 92 (Regular)  BP: 142/76 (Sitting, Left Arm, Standard)     Physical Exam (Will Schier A. Rennee Coyne MD; 05/22/2014 10:53 AM)  General Mental Status-Alert. General Appearance-Consistent with stated age. Hydration-Well hydrated. Voice-Normal.  Head and Neck Head-normocephalic, atraumatic with no lesions or palpable masses. Trachea-midline. Thyroid Gland Characteristics - normal size and consistency.  Chest and Lung Exam Chest and lung exam reveals -quiet, even and easy respiratory effort with no use of accessory muscles and on auscultation, normal breath sounds, no adventitious sounds and normal vocal resonance. Inspection Chest Wall - Normal. Back - normal.  Breast Note: bruising  left breast no mass  rigt breast normal   Cardiovascular Cardiovascular examination reveals -normal heart sounds, regular rate and rhythm with no murmurs and normal pedal pulses bilaterally.  Musculoskeletal Normal Exam - Left-Upper Extremity Strength Normal and Lower Extremity Strength Normal. Normal Exam - Right-Upper Extremity Strength Normal and Lower Extremity Strength Normal.  Lymphatic Axillary  General Axillary Region: Bilateral - Description - Normal. Tenderness - Non Tender.    Assessment & Plan (Shalena Ezzell A. Alfonso Shackett MD; 05/22/2014 11:50 AM)  LEFT BREAST MASS (611.72  N63) Impression: sclerosing lesion carries small risk of malignancy. Discussed lumpectomy vs observation and risk benefit of each. She wishes to proceed with left breast seed localized lumpectomy. Risk of lumpectomy include bleeding, infection, seroma, more surgery, use of seed/wire, wound care, cosmetic deformity and the need for other treatments, death , blood clots, death. Pt agrees to proceed.  Current Plans Pt Education - [facility] Breast Biopsy [facility]

## 2014-06-20 NOTE — Op Note (Signed)
Peoperative diagnosis: Left breast SCLEROSING LESION  Postoperative diagnosis: Same   Procedure: Left breast seed localized lumpectomy  Surgeon: Erroll Luna M.D.  Anesthesia: Gen. With 0.25% Sensorcaine local  EBL: 20 cc  Specimen: Left breast tissue with clip and radioactive seed in the specimen. Verified with neoprobe and radiographic image showing both seed and clip in specimen  Indications for procedure: The patient presents for left breast  lumpectomy after core biopsy showed SCLEROSING LESION . Discussed the rationale for considering excision. Small risk of malignancy associated with this  lesion after core biopsy. Discussed observation. Discussed wire localization. Patient desired excision of left breast sclerosing lesion.The procedure has been discussed with the patient. Alternatives to surgery have been discussed with the patient.  Risks of surgery include bleeding,  Infection,  Seroma formation, death,  and the need for further surgery.   The patient understands and wishes to proceed.   Description of procedure: Patient underwent seed placement as an outpatient. Patient presents today for left breast seed localized lumpectomy. Patient and holding area. Questions are answered and neoprobe used to verify seed location. Patient taken back to the operating room and placed upon the OR table. After induction of general anesthesia, left breast prepped and draped in a sterile fashion. Timeout was done to verify proper side and procedure. Neoprobe used and hot spot identified and left breast central regiont. This was marked with pen. Curvilinear incision made left upper nipple of the breast. Dissection used with the help of a neoprobe around the tissue where the seed and clip were located. Tissue removed in its entirety with gross negative  margins.. Neoprobe used and seed within specimen. Radiographs taken which show clip and seed  In specimen.hemostasis achieved and cavity closed with 3-0  Vicryl and 4-0 Monocryl. Specimen oriented.  Dermabond applied. All final counts found to be correct. Specimen transported to pathology. Patient awoke extubated taken to recovery in satisfactory condition.

## 2014-06-20 NOTE — Anesthesia Preprocedure Evaluation (Signed)
Anesthesia Evaluation  Patient identified by MRN, date of birth, ID band Patient awake    Reviewed: Allergy & Precautions, NPO status , Patient's Chart, lab work & pertinent test results  Airway Mallampati: II   Neck ROM: full    Dental   Pulmonary Current Smoker,  breath sounds clear to auscultation        Cardiovascular hypertension, + Peripheral Vascular Disease Rhythm:regular Rate:Normal     Neuro/Psych    GI/Hepatic   Endo/Other    Renal/GU      Musculoskeletal   Abdominal   Peds  Hematology   Anesthesia Other Findings   Reproductive/Obstetrics                             Anesthesia Physical Anesthesia Plan  ASA: II  Anesthesia Plan: General   Post-op Pain Management:    Induction: Intravenous  Airway Management Planned: LMA  Additional Equipment:   Intra-op Plan:   Post-operative Plan:   Informed Consent: I have reviewed the patients History and Physical, chart, labs and discussed the procedure including the risks, benefits and alternatives for the proposed anesthesia with the patient or authorized representative who has indicated his/her understanding and acceptance.     Plan Discussed with: CRNA, Anesthesiologist and Surgeon  Anesthesia Plan Comments:         Anesthesia Quick Evaluation

## 2014-06-20 NOTE — Interval H&P Note (Signed)
History and Physical Interval Note:  06/20/2014 12:52 PM  Tina Ingram  has presented today for surgery, with the diagnosis of Left Breast Mass  The various methods of treatment have been discussed with the patient and family. After consideration of risks, benefits and other options for treatment, the patient has consented to  Procedure(s): BREAST LUMPECTOMY WITH RADIOACTIVE SEED LOCALIZATION (Left) as a surgical intervention .  The patient's history has been reviewed, patient examined, no change in status, stable for surgery.  I have reviewed the patient's chart and labs.  Questions were answered to the patient's satisfaction.     Latandra Loureiro A.

## 2014-06-20 NOTE — Anesthesia Procedure Notes (Signed)
Procedure Name: LMA Insertion Date/Time: 06/20/2014 1:02 PM Performed by: Lyndee Leo Pre-anesthesia Checklist: Patient identified, Emergency Drugs available, Suction available and Patient being monitored Patient Re-evaluated:Patient Re-evaluated prior to inductionOxygen Delivery Method: Circle System Utilized Preoxygenation: Pre-oxygenation with 100% oxygen Intubation Type: IV induction Ventilation: Mask ventilation without difficulty LMA: LMA inserted LMA Size: 4.0 Number of attempts: 1 Airway Equipment and Method: Bite block Placement Confirmation: positive ETCO2 Tube secured with: Tape Dental Injury: Teeth and Oropharynx as per pre-operative assessment

## 2014-06-20 NOTE — Anesthesia Postprocedure Evaluation (Signed)
Anesthesia Post Note  Patient: Tina Ingram  Procedure(s) Performed: Procedure(s) (LRB): BREAST LUMPECTOMY WITH RADIOACTIVE SEED LOCALIZATION (Left)  Anesthesia type: General  Patient location: PACU  Post pain: Pain level controlled and Adequate analgesia  Post assessment: Post-op Vital signs reviewed, Patient's Cardiovascular Status Stable, Respiratory Function Stable, Patent Airway and Pain level controlled  Last Vitals:  Filed Vitals:   06/20/14 1442  BP: 136/55  Pulse: 88  Temp: 36.4 C  Resp: 18    Post vital signs: Reviewed and stable  Level of consciousness: awake, alert  and oriented  Complications: No apparent anesthesia complications

## 2014-06-20 NOTE — Transfer of Care (Signed)
Immediate Anesthesia Transfer of Care Note  Patient: Tina Ingram  Procedure(s) Performed: Procedure(s): BREAST LUMPECTOMY WITH RADIOACTIVE SEED LOCALIZATION (Left)  Patient Location: PACU  Anesthesia Type:General  Level of Consciousness: awake, sedated and patient cooperative  Airway & Oxygen Therapy: Patient Spontanous Breathing and Patient connected to face mask oxygen  Post-op Assessment: Report given to RN and Post -op Vital signs reviewed and stable  Post vital signs: Reviewed and stable  Last Vitals:  Filed Vitals:   06/20/14 1217  BP: 123/60  Pulse: 84  Temp: 36.9 C  Resp: 16    Complications: No apparent anesthesia complications

## 2014-06-20 NOTE — Discharge Instructions (Signed)
[facility] °Office Phone Number [phone] ° °BREAST BIOPSY/ PARTIAL MASTECTOMY: POST OP INSTRUCTIONS ° °Always review your discharge instruction sheet given to you by the facility where your surgery was performed. ° °IF YOU HAVE DISABILITY OR FAMILY LEAVE FORMS, YOU MUST BRING THEM TO THE OFFICE FOR PROCESSING.  DO NOT GIVE THEM TO YOUR DOCTOR. ° °1. A prescription for pain medication may be given to you upon discharge.  Take your pain medication as prescribed, if needed.  If narcotic pain medicine is not needed, then you may take acetaminophen (Tylenol) or ibuprofen (Advil) as needed. °2. Take your usually prescribed medications unless otherwise directed °3. If you need a refill on your pain medication, please contact your pharmacy.  They will contact our office to request authorization.  Prescriptions will not be filled after 5pm or on week-ends. °4. You should eat very light the first 24 hours after surgery, such as soup, crackers, pudding, etc.  Resume your normal diet the day after surgery. °5. Most patients will experience some swelling and bruising in the breast.  Ice packs and a good support bra will help.  Swelling and bruising can take several days to resolve.  °6. It is common to experience some constipation if taking pain medication after surgery.  Increasing fluid intake and taking a stool softener will usually help or prevent this problem from occurring.  A mild laxative (Milk of Magnesia or Miralax) should be taken according to package directions if there are no bowel movements after 48 hours. °7. Unless discharge instructions indicate otherwise, you may remove your bandages 24-48 hours after surgery, and you may shower at that time.  You may have steri-strips (small skin tapes) in place directly over the incision.  These strips should be left on the skin for 7-10 days.  If your surgeon used skin glue on the incision, you may shower in 24 hours.  The glue will flake off over the  next 2-3 weeks.  Any sutures or staples will be removed at the office during your follow-up visit. °8. ACTIVITIES:  You may resume regular daily activities (gradually increasing) beginning the next day.  Wearing a good support bra or sports bra minimizes pain and swelling.  You may have sexual intercourse when it is comfortable. °a. You may drive when you no longer are taking prescription pain medication, you can comfortably wear a seatbelt, and you can safely maneuver your car and apply brakes. °b. RETURN TO WORK:  ______________________________________________________________________________________ °9. You should see your doctor in the office for a follow-up appointment approximately two weeks after your surgery.  Your doctor’s nurse will typically make your follow-up appointment when she calls you with your pathology report.  Expect your pathology report 2-3 business days after your surgery.  You may call to check if you do not hear from us after three days. °10. OTHER INSTRUCTIONS: _______________________________________________________________________________________________ _____________________________________________________________________________________________________________________________________ °_____________________________________________________________________________________________________________________________________ °_____________________________________________________________________________________________________________________________________ ° °WHEN TO CALL YOUR DOCTOR: °1. Fever over 101.0 °2. Nausea and/or vomiting. °3. Extreme swelling or bruising. °4. Continued bleeding from incision. °5. Increased pain, redness, or drainage from the incision. ° °The clinic staff is available to answer your questions during regular business hours.  Please don’t hesitate to call and ask to speak to one of the nurses for clinical concerns.  If you have a medical emergency, go to the nearest  emergency room or call 911.  A surgeon from [facility] is always on call at the hospital. ° °For further questions, please visit [email or web address]  ° ° ° °  Post Anesthesia Home Care Instructions ° °Activity: °Get plenty of rest for the remainder of the day. A responsible adult should stay with you for 24 hours following the procedure.  °For the next 24 hours, DO NOT: °-Drive a car °-Operate machinery °-Drink alcoholic beverages °-Take any medication unless instructed by your physician °-Make any legal decisions or sign important papers. ° °Meals: °Start with liquid foods such as gelatin or soup. Progress to regular foods as tolerated. Avoid greasy, spicy, heavy foods. If nausea and/or vomiting occur, drink only clear liquids until the nausea and/or vomiting subsides. Call your physician if vomiting continues. ° °Special Instructions/Symptoms: °Your throat may feel dry or sore from the anesthesia or the breathing tube placed in your throat during surgery. If this causes discomfort, gargle with warm salt water. The discomfort should disappear within 24 hours. ° °If you had a scopolamine patch placed behind your ear for the management of post- operative nausea and/or vomiting: ° °1. The medication in the patch is effective for 72 hours, after which it should be removed.  Wrap patch in a tissue and discard in the trash. Wash hands thoroughly with soap and water. °2. You may remove the patch earlier than 72 hours if you experience unpleasant side effects which may include dry mouth, dizziness or visual disturbances. °3. Avoid touching the patch. Wash your hands with soap and water after contact with the patch. °  ° °

## 2014-06-22 ENCOUNTER — Encounter (HOSPITAL_BASED_OUTPATIENT_CLINIC_OR_DEPARTMENT_OTHER): Payer: Self-pay | Admitting: Surgery

## 2015-03-12 ENCOUNTER — Other Ambulatory Visit: Payer: Self-pay | Admitting: Family Medicine

## 2015-03-12 DIAGNOSIS — Z1231 Encounter for screening mammogram for malignant neoplasm of breast: Secondary | ICD-10-CM

## 2015-04-12 ENCOUNTER — Ambulatory Visit: Payer: Medicare Other

## 2015-04-23 ENCOUNTER — Ambulatory Visit
Admission: RE | Admit: 2015-04-23 | Discharge: 2015-04-23 | Disposition: A | Discharge status: Home / Self Care | Payer: Medicare Other | Source: Ambulatory Visit | Attending: Family Medicine | Admitting: Family Medicine

## 2015-04-23 ENCOUNTER — Other Ambulatory Visit: Payer: Self-pay | Admitting: Family Medicine

## 2015-04-23 DIAGNOSIS — Z1231 Encounter for screening mammogram for malignant neoplasm of breast: Secondary | ICD-10-CM | POA: Insufficient documentation

## 2015-06-21 ENCOUNTER — Encounter: Payer: Self-pay | Admitting: *Deleted

## 2015-06-24 NOTE — H&P (Signed)
  See scanned notes. 

## 2015-06-25 ENCOUNTER — Encounter: Payer: Self-pay | Admitting: *Deleted

## 2015-06-25 ENCOUNTER — Ambulatory Visit: Payer: Medicare Other | Admitting: Anesthesiology

## 2015-06-25 ENCOUNTER — Ambulatory Visit
Admission: RE | Admit: 2015-06-25 | Discharge: 2015-06-25 | Disposition: A | Discharge status: Home / Self Care | Payer: Medicare Other | Source: Ambulatory Visit | Attending: Ophthalmology | Admitting: Ophthalmology

## 2015-06-25 ENCOUNTER — Encounter
Admission: RE | Disposition: A | Discharge status: Home / Self Care | Payer: Self-pay | Source: Ambulatory Visit | Attending: Ophthalmology

## 2015-06-25 DIAGNOSIS — F17210 Nicotine dependence, cigarettes, uncomplicated: Secondary | ICD-10-CM | POA: Insufficient documentation

## 2015-06-25 DIAGNOSIS — I10 Essential (primary) hypertension: Secondary | ICD-10-CM | POA: Diagnosis not present

## 2015-06-25 DIAGNOSIS — Z79899 Other long term (current) drug therapy: Secondary | ICD-10-CM | POA: Diagnosis not present

## 2015-06-25 DIAGNOSIS — Z7982 Long term (current) use of aspirin: Secondary | ICD-10-CM | POA: Diagnosis not present

## 2015-06-25 DIAGNOSIS — H2511 Age-related nuclear cataract, right eye: Secondary | ICD-10-CM | POA: Insufficient documentation

## 2015-06-25 HISTORY — PX: CATARACT EXTRACTION W/PHACO: SHX586

## 2015-06-25 SURGERY — PHACOEMULSIFICATION, CATARACT, WITH IOL INSERTION
Anesthesia: Monitor Anesthesia Care | Site: Eye | Laterality: Right | Wound class: Clean

## 2015-06-25 MED ORDER — BUPIVACAINE HCL (PF) 0.75 % IJ SOLN
INTRAMUSCULAR | Status: AC
  Filled 2015-06-25: qty 10

## 2015-06-25 MED ORDER — PHENYLEPHRINE HCL 10 % OP SOLN
1.0000 [drp] | OPHTHALMIC | Status: DC | PRN
  Administered 2015-06-25: 1 [drp] via OPHTHALMIC

## 2015-06-25 MED ORDER — LIDOCAINE HCL (PF) 4 % IJ SOLN
INTRAMUSCULAR | Status: DC | PRN
  Administered 2015-06-25: 10:00:00 via OPHTHALMIC

## 2015-06-25 MED ORDER — BSS IO SOLN
INTRAOCULAR | Status: DC | PRN
  Administered 2015-06-25: 10:00:00 via OPHTHALMIC

## 2015-06-25 MED ORDER — HYALURONIDASE HUMAN 150 UNIT/ML IJ SOLN
INTRAMUSCULAR | Status: AC
  Filled 2015-06-25: qty 1

## 2015-06-25 MED ORDER — LIDOCAINE HCL (PF) 4 % IJ SOLN
INTRAMUSCULAR | Status: AC
  Filled 2015-06-25: qty 5

## 2015-06-25 MED ORDER — EPINEPHRINE HCL 1 MG/ML IJ SOLN
INTRAOCULAR | Status: DC | PRN
  Administered 2015-06-25: 10:00:00 via OPHTHALMIC

## 2015-06-25 MED ORDER — CYCLOPENTOLATE HCL 2 % OP SOLN
1.0000 [drp] | OPHTHALMIC | Status: DC | PRN
  Administered 2015-06-25: 1 [drp] via OPHTHALMIC

## 2015-06-25 MED ORDER — POVIDONE-IODINE 5 % OP SOLN
OPHTHALMIC | Status: DC | PRN
  Administered 2015-06-25: 1 via OPHTHALMIC

## 2015-06-25 MED ORDER — ALFENTANIL 500 MCG/ML IJ INJ
INJECTION | INTRAMUSCULAR | Status: DC | PRN
  Administered 2015-06-25: 600 ug via INTRAVENOUS

## 2015-06-25 MED ORDER — NA CHONDROIT SULF-NA HYALURON 40-17 MG/ML IO SOLN
INTRAOCULAR | Status: DC | PRN
  Administered 2015-06-25: 1 mL via INTRAOCULAR

## 2015-06-25 MED ORDER — CEFUROXIME OPHTHALMIC INJECTION 1 MG/0.1 ML
INJECTION | OPHTHALMIC | Status: AC
  Filled 2015-06-25: qty 0.1

## 2015-06-25 MED ORDER — MOXIFLOXACIN HCL 0.5 % OP SOLN
OPHTHALMIC | Status: DC | PRN
  Administered 2015-06-25: 1 [drp] via OPHTHALMIC

## 2015-06-25 MED ORDER — EPINEPHRINE HCL 1 MG/ML IJ SOLN
INTRAMUSCULAR | Status: AC
  Filled 2015-06-25: qty 1

## 2015-06-25 MED ORDER — SODIUM CHLORIDE 0.9 % IV SOLN
INTRAVENOUS | Status: DC
  Administered 2015-06-25 (×2): via INTRAVENOUS

## 2015-06-25 MED ORDER — CEFUROXIME OPHTHALMIC INJECTION 1 MG/0.1 ML
INJECTION | OPHTHALMIC | Status: DC | PRN
  Administered 2015-06-25: 0.1 mL via INTRACAMERAL

## 2015-06-25 MED ORDER — NA CHONDROIT SULF-NA HYALURON 40-17 MG/ML IO SOLN
INTRAOCULAR | Status: AC
  Filled 2015-06-25: qty 1

## 2015-06-25 MED ORDER — PHENYLEPHRINE HCL 10 % OP SOLN
OPHTHALMIC | Status: AC
  Filled 2015-06-25: qty 5

## 2015-06-25 MED ORDER — TETRACAINE HCL 0.5 % OP SOLN
OPHTHALMIC | Status: DC | PRN
  Administered 2015-06-25: 1 [drp] via OPHTHALMIC

## 2015-06-25 MED ORDER — CARBACHOL 0.01 % IO SOLN
INTRAOCULAR | Status: DC | PRN
  Administered 2015-06-25: 0.5 mL via INTRAOCULAR

## 2015-06-25 MED ORDER — MOXIFLOXACIN HCL 0.5 % OP SOLN
1.0000 [drp] | OPHTHALMIC | Status: DC | PRN
  Administered 2015-06-25: 1 [drp] via OPHTHALMIC

## 2015-06-25 MED ORDER — CYCLOPENTOLATE HCL 2 % OP SOLN
OPHTHALMIC | Status: AC
  Filled 2015-06-25: qty 2

## 2015-06-25 MED ORDER — POVIDONE-IODINE 5 % OP SOLN
OPHTHALMIC | Status: AC
  Filled 2015-06-25: qty 30

## 2015-06-25 MED ORDER — TETRACAINE HCL 0.5 % OP SOLN
OPHTHALMIC | Status: AC
  Filled 2015-06-25: qty 2

## 2015-06-25 MED ORDER — MOXIFLOXACIN HCL 0.5 % OP SOLN
OPHTHALMIC | Status: AC
  Filled 2015-06-25: qty 3

## 2015-06-25 SURGICAL SUPPLY — 30 items
CANNULA ANT/CHMB 27GA (MISCELLANEOUS) ×2 IMPLANT
CORD BIP STRL DISP 12FT (MISCELLANEOUS) ×2 IMPLANT
CUP MEDICINE 2OZ PLAST GRAD ST (MISCELLANEOUS) ×2 IMPLANT
DRAPE XRAY CASSETTE 23X24 (DRAPES) ×2 IMPLANT
ERASER HMR WETFIELD 18G (MISCELLANEOUS) ×2 IMPLANT
GLOVE BIO SURGEON STRL SZ8 (GLOVE) ×2 IMPLANT
GLOVE SURG LX 6.5 MICRO (GLOVE) ×1
GLOVE SURG LX 8.0 MICRO (GLOVE) ×1
GLOVE SURG LX STRL 6.5 MICRO (GLOVE) ×1 IMPLANT
GLOVE SURG LX STRL 8.0 MICRO (GLOVE) ×1 IMPLANT
GOWN STRL REUS W/ TWL LRG LVL3 (GOWN DISPOSABLE) ×1 IMPLANT
GOWN STRL REUS W/ TWL XL LVL3 (GOWN DISPOSABLE) ×1 IMPLANT
GOWN STRL REUS W/TWL LRG LVL3 (GOWN DISPOSABLE) ×1
GOWN STRL REUS W/TWL XL LVL3 (GOWN DISPOSABLE) ×1
LENS IOL ACRSF IQ ULTRA 24.0 (Intraocular Lens) ×1 IMPLANT
LENS IOL ACRYSOF IQ 24.0 (Intraocular Lens) ×2 IMPLANT
PACK CATARACT (MISCELLANEOUS) ×2 IMPLANT
PACK CATARACT DINGLEDEIN LX (MISCELLANEOUS) ×2 IMPLANT
PACK EYE AFTER SURG (MISCELLANEOUS) ×2 IMPLANT
SHLD EYE VISITEC  UNIV (MISCELLANEOUS) ×2 IMPLANT
SOL BSS BAG (MISCELLANEOUS) ×2
SOL PREP PVP 2OZ (MISCELLANEOUS) ×2
SOLUTION BSS BAG (MISCELLANEOUS) ×1 IMPLANT
SOLUTION PREP PVP 2OZ (MISCELLANEOUS) ×1 IMPLANT
SUT SILK 5-0 (SUTURE) ×2 IMPLANT
SYR 3ML LL SCALE MARK (SYRINGE) ×2 IMPLANT
SYR 5ML LL (SYRINGE) ×2 IMPLANT
SYR TB 1ML 27GX1/2 LL (SYRINGE) ×2 IMPLANT
WATER STERILE IRR 1000ML POUR (IV SOLUTION) ×2 IMPLANT
WIPE NON LINTING 3.25X3.25 (MISCELLANEOUS) ×2 IMPLANT

## 2015-06-25 NOTE — Anesthesia Procedure Notes (Signed)
Procedure Name: MAC Date/Time: 06/25/2015 9:50 AM Performed by: Allean Found Pre-anesthesia Checklist: Patient identified, Emergency Drugs available, Suction available, Patient being monitored and Timeout performed Patient Re-evaluated:Patient Re-evaluated prior to inductionOxygen Delivery Method: Nasal cannula Preoxygenation: Pre-oxygenation with 100% oxygen Intubation Type: IV induction

## 2015-06-25 NOTE — Transfer of Care (Signed)
Immediate Anesthesia Transfer of Care Note  Patient: AVAIYA SAWINSKI  Procedure(s) Performed: Procedure(s) with comments: CATARACT EXTRACTION PHACO AND INTRAOCULAR LENS PLACEMENT (IOC) (Right) - [place] 2.01 AP% 24.5 CDE 52.16 Fluid pack lot # [id number]  Patient Location: PACU  Anesthesia Type:MAC  Level of Consciousness: awake  Airway & Oxygen Therapy: Patient Spontanous Breathing and Patient connected to nasal cannula oxygen  Post-op Assessment: Report given to RN and Post -op Vital signs reviewed and stable  Post vital signs: Reviewed and stable  Last Vitals:  Filed Vitals:   06/25/15 0854  BP: 132/62  Pulse: 66  Temp: 36.7 C  Resp: 16    Last Pain: There were no vitals filed for this visit.       Complications: No apparent anesthesia complications

## 2015-06-25 NOTE — Anesthesia Postprocedure Evaluation (Signed)
Anesthesia Post Note  Patient: Tina Ingram  Procedure(s) Performed: Procedure(s) (LRB): CATARACT EXTRACTION PHACO AND INTRAOCULAR LENS PLACEMENT (IOC) (Right)  Patient location during evaluation: PACU Anesthesia Type: MAC Level of consciousness: awake Pain management: pain level controlled Vital Signs Assessment: post-procedure vital signs reviewed and stable Respiratory status: spontaneous breathing Cardiovascular status: blood pressure returned to baseline Postop Assessment: no headache Anesthetic complications: no    Last Vitals:  Filed Vitals:   06/25/15 0854  BP: 132/62  Pulse: 66  Temp: 36.7 C  Resp: 16    Last Pain: There were no vitals filed for this visit.               Buckner Malta

## 2015-06-25 NOTE — Anesthesia Preprocedure Evaluation (Signed)
Anesthesia Evaluation  Patient identified by MRN, date of birth, ID band Patient awake    Reviewed: Allergy & Precautions, NPO status , Patient's Chart, lab work & pertinent test results  History of Anesthesia Complications Negative for: history of anesthetic complications  Airway Mallampati: III       Dental   Pulmonary Current Smoker,           Cardiovascular hypertension, Pt. on medications      Neuro/Psych negative neurological ROS     GI/Hepatic negative GI ROS, Neg liver ROS,   Endo/Other  negative endocrine ROS  Renal/GU negative Renal ROS     Musculoskeletal   Abdominal   Peds  Hematology negative hematology ROS (+)   Anesthesia Other Findings   Reproductive/Obstetrics                             Anesthesia Physical Anesthesia Plan  ASA: II  Anesthesia Plan: MAC   Post-op Pain Management:    Induction: Intravenous  Airway Management Planned: Nasal Cannula  Additional Equipment:   Intra-op Plan:   Post-operative Plan:   Informed Consent: I have reviewed the patients History and Physical, chart, labs and discussed the procedure including the risks, benefits and alternatives for the proposed anesthesia with the patient or authorized representative who has indicated his/her understanding and acceptance.     Plan Discussed with:   Anesthesia Plan Comments:         Anesthesia Quick Evaluation

## 2015-06-25 NOTE — Discharge Instructions (Signed)
Eye Surgery Discharge Instructions  Expect mild scratchy sensation or mild soreness. DO NOT RUB YOUR EYE!  The day of surgery:  Minimal physical activity, but bed rest is not required  No reading, computer work, or close hand work  No bending, lifting, or straining.  May watch TV  For 24 hours:  No driving, legal decisions, or alcoholic beverages  Safety precautions  Eat anything you prefer: It is better to start with liquids, then soup then solid foods.  _____ Eye patch should be worn until postoperative exam tomorrow.  ____ Solar shield eyeglasses should be worn for comfort in the sunlight/patch while sleeping  Resume all regular medications including aspirin or Coumadin if these were discontinued prior to surgery. You may shower, bathe, shave, or wash your hair. Tylenol may be taken for mild discomfort.  Call your doctor if you experience significant pain, nausea, or vomiting, fever > 101 or other signs of infection. [phone] or [phone] Specific instructions:  Follow-up Information    Follow up with Lillyan Hitson, MD In 1 day.   Specialty:  Ophthalmology   Why:  June 13 at 8:40am   Contact information:   [address]   [place] [address] [phone]

## 2015-06-25 NOTE — Op Note (Signed)
Date of Surgery: 06/25/2015 Date of Dictation: 06/25/2015 10:23 AM Pre-operative Diagnosis:  Nuclear Sclerotic Cataract right Eye Post-operative Diagnosis: same Procedure performed: Extra-capsular Cataract Extraction (ECCE) with placement of a posterior chamber intraocular lens (IOL) right Eye IOL:  Implant Name Type Inv. Item Serial No. Manufacturer Lot No. LRB No. Used  LENS IOL ACRYSOF IQ 24.0 - [id number] Intraocular Lens LENS IOL ACRYSOF IQ 24.0 [id number] ALCON  Right 1  LENS IOL ACRYSOF IQ 24.0 - [id number] Intraocular Lens LENS IOL ACRYSOF IQ 24.0 [id number] ALCON   Right 1   Anesthesia: 2% Lidocaine and 4% Marcaine in a 50/50 mixture with 10 unites/ml of Hylenex given as a peribulbar Anesthesiologist: Anesthesiologist: Gunnar Fusi, MD CRNA: Allean Found, CRNA Complications: none Estimated Blood Loss: less than 1 ml  Description of procedure:  The patient was given anesthesia and sedation via intravenous access. The patient was then prepped and draped in the usual fashion. A 25-gauge needle was bent for initiating the capsulorhexis. A 5-0 silk suture was placed through the conjunctiva superior and inferiorly to serve as bridle sutures. Hemostasis was obtained at the superior limbus using an eraser cautery. A partial thickness groove was made at the anterior surgical limbus with a 64 Beaver blade and this was dissected anteriorly with an Avaya. The anterior chamber was entered at 10 o'clock with a 1.0 mm paracentesis knife and through the lamellar dissection with a 2.6 mm Alcon keratome. Epi-Shugarcaine 0.5 CC [9 cc BSS Plus (Alcon), 3 cc 4% preservative-free lidocaine (Hospira) and 4 cc 1:1000 preservative-free, bisulfite-free epinephrine] was injected into the anterior chamber via the paracentesis tract. Epi-Shugarcaine 0.5 CC [9 cc BSS Plus (Alcon), 3 cc 4% preservative-free lidocaine (Hospira) and 4 cc 1:1000 preservative-free, bisulfite-free epinephrine] was  injected into the anterior chamber via the paracentesis tract. DiscoVisc was injected to replace the aqueous and a continuous tear curvilinear capsulorhexis was performed using a bent 25-gauge needle.  Balance salt on a syringe was used to perform hydro-dissection and phacoemulsification was carried out using a divide and conquer technique. Procedure(s) with comments: CATARACT EXTRACTION PHACO AND INTRAOCULAR LENS PLACEMENT (IOC) (Right) - [place] 2.01 AP% 24.5 CDE 52.16 Fluid pack lot # [id number]. Irrigation/aspiration was used to remove the residual cortex and the capsular bag was inflated with DiscoVisc. The intraocular lens was inserted into the capsular bag using a pre-loaded UltraSert Delivery System. Irrigation/aspiration was used to remove the residual DiscoVisc. The wound was inflated with balanced salt and checked for leaks. None were found. Miostat was injected via the paracentesis track and 0.1 ml of cefuroxime containing 1 mg of drug  was injected via the paracentesis track. The wound was checked for leaks again and none were found.   The bridal sutures were removed and two drops of Vigamox were placed on the eye. An eye shield was placed to protect the eye and the patient was discharged to the recovery area in good condition.   Cairo Agostinelli MD

## 2015-06-25 NOTE — Interval H&P Note (Signed)
History and Physical Interval Note:  06/25/2015 9:35 AM  Tina Ingram  has presented today for surgery, with the diagnosis of NUCLEAR SCLEROTIC CATARACT RIGHT EYE  The various methods of treatment have been discussed with the patient and family. After consideration of risks, benefits and other options for treatment, the patient has consented to  Procedure(s) with comments: CATARACT EXTRACTION PHACO AND INTRAOCULAR LENS PLACEMENT (IOC) (Right) - [place] AP% CDE Fluid pack lot # [id number] as a surgical intervention .  The patient's history has been reviewed, patient examined, no change in status, stable for surgery.  I have reviewed the patient's chart and labs.  Questions were answered to the patient's satisfaction.     Romelia Bromell

## 2016-06-26 ENCOUNTER — Other Ambulatory Visit: Payer: Self-pay | Admitting: Family Medicine

## 2016-06-26 DIAGNOSIS — Z1231 Encounter for screening mammogram for malignant neoplasm of breast: Secondary | ICD-10-CM

## 2016-07-02 ENCOUNTER — Ambulatory Visit
Admission: RE | Admit: 2016-07-02 | Discharge: 2016-07-02 | Disposition: A | Discharge status: Home / Self Care | Payer: Medicare Other | Source: Ambulatory Visit | Attending: Family Medicine | Admitting: Family Medicine

## 2016-07-02 DIAGNOSIS — Z1231 Encounter for screening mammogram for malignant neoplasm of breast: Secondary | ICD-10-CM | POA: Diagnosis present

## 2016-09-11 DIAGNOSIS — N183 Chronic kidney disease, stage 3 unspecified: Secondary | ICD-10-CM | POA: Insufficient documentation

## 2017-01-12 ENCOUNTER — Other Ambulatory Visit: Payer: Self-pay | Admitting: Sports Medicine

## 2017-01-12 DIAGNOSIS — M4854XA Collapsed vertebra, not elsewhere classified, thoracic region, initial encounter for fracture: Secondary | ICD-10-CM

## 2017-01-20 ENCOUNTER — Encounter
Admission: RE | Admit: 2017-01-20 | Discharge: 2017-01-20 | Disposition: A | Discharge status: Home / Self Care | Payer: Medicare Other | Source: Ambulatory Visit | Attending: Sports Medicine | Admitting: Sports Medicine

## 2017-01-20 DIAGNOSIS — M4854XA Collapsed vertebra, not elsewhere classified, thoracic region, initial encounter for fracture: Secondary | ICD-10-CM | POA: Insufficient documentation

## 2017-01-20 MED ORDER — TECHNETIUM TC 99M MEDRONATE IV KIT
25.0000 | PACK | Freq: Once | INTRAVENOUS | Status: AC | PRN
  Administered 2017-01-20: 22.64 via INTRAVENOUS

## 2017-01-27 ENCOUNTER — Ambulatory Visit: Payer: Medicare Other | Admitting: Anesthesiology

## 2017-01-27 ENCOUNTER — Encounter
Admission: RE | Disposition: A | Discharge status: Home / Self Care | Payer: Self-pay | Source: Ambulatory Visit | Attending: Orthopedic Surgery

## 2017-01-27 ENCOUNTER — Ambulatory Visit: Payer: Medicare Other

## 2017-01-27 ENCOUNTER — Ambulatory Visit
Admission: RE | Admit: 2017-01-27 | Discharge: 2017-01-27 | Disposition: A | Discharge status: Home / Self Care | Payer: Medicare Other | Source: Ambulatory Visit | Attending: Orthopedic Surgery | Admitting: Orthopedic Surgery

## 2017-01-27 DIAGNOSIS — S22070A Wedge compression fracture of T9-T10 vertebra, initial encounter for closed fracture: Secondary | ICD-10-CM | POA: Insufficient documentation

## 2017-01-27 DIAGNOSIS — W19XXXA Unspecified fall, initial encounter: Secondary | ICD-10-CM | POA: Diagnosis not present

## 2017-01-27 DIAGNOSIS — S22080A Wedge compression fracture of T11-T12 vertebra, initial encounter for closed fracture: Secondary | ICD-10-CM | POA: Insufficient documentation

## 2017-01-27 DIAGNOSIS — Z7982 Long term (current) use of aspirin: Secondary | ICD-10-CM | POA: Insufficient documentation

## 2017-01-27 DIAGNOSIS — Z9181 History of falling: Secondary | ICD-10-CM | POA: Insufficient documentation

## 2017-01-27 DIAGNOSIS — Z79899 Other long term (current) drug therapy: Secondary | ICD-10-CM | POA: Insufficient documentation

## 2017-01-27 DIAGNOSIS — E119 Type 2 diabetes mellitus without complications: Secondary | ICD-10-CM | POA: Insufficient documentation

## 2017-01-27 DIAGNOSIS — I10 Essential (primary) hypertension: Secondary | ICD-10-CM | POA: Insufficient documentation

## 2017-01-27 DIAGNOSIS — Z419 Encounter for procedure for purposes other than remedying health state, unspecified: Secondary | ICD-10-CM

## 2017-01-27 HISTORY — PX: KYPHOPLASTY: SHX5884

## 2017-01-27 LAB — POCT I-STAT 4, (NA,K, GLUC, HGB,HCT)
Glucose, Bld: 100 mg/dL — ABNORMAL HIGH (ref 65–99)
HCT: 43 % (ref 36.0–46.0)
HEMOGLOBIN: 14.6 g/dL (ref 12.0–15.0)
Potassium: 3.4 mmol/L — ABNORMAL LOW (ref 3.5–5.1)
SODIUM: 140 mmol/L (ref 135–145)

## 2017-01-27 SURGERY — KYPHOPLASTY
Anesthesia: Monitor Anesthesia Care | Wound class: Clean

## 2017-01-27 MED ORDER — LIDOCAINE HCL (PF) 1 % IJ SOLN
INTRAMUSCULAR | Status: AC
  Filled 2017-01-27: qty 30

## 2017-01-27 MED ORDER — PROPOFOL 500 MG/50ML IV EMUL
INTRAVENOUS | Status: DC | PRN
  Administered 2017-01-27: 25 ug/kg/min via INTRAVENOUS

## 2017-01-27 MED ORDER — ONDANSETRON HCL 4 MG/2ML IJ SOLN: 4.0000 mg | Freq: Once | INTRAMUSCULAR | Status: DC | PRN

## 2017-01-27 MED ORDER — FENTANYL CITRATE (PF) 100 MCG/2ML IJ SOLN: 25.0000 ug | INTRAMUSCULAR | Status: DC | PRN

## 2017-01-27 MED ORDER — PROPOFOL 10 MG/ML IV BOLUS
INTRAVENOUS | Status: DC | PRN
  Administered 2017-01-27: 20 mg via INTRAVENOUS
  Administered 2017-01-27: 40 mg via INTRAVENOUS

## 2017-01-27 MED ORDER — BUPIVACAINE-EPINEPHRINE (PF) 0.5% -1:200000 IJ SOLN
INTRAMUSCULAR | Status: DC | PRN
  Administered 2017-01-27: 30 mL

## 2017-01-27 MED ORDER — CEFAZOLIN SODIUM-DEXTROSE 1-4 GM/50ML-% IV SOLN
1.0000 g | Freq: Once | INTRAVENOUS | Status: AC
  Administered 2017-01-27: 1 g via INTRAVENOUS

## 2017-01-27 MED ORDER — DEXMEDETOMIDINE HCL 200 MCG/2ML IV SOLN
INTRAVENOUS | Status: DC | PRN
  Administered 2017-01-27: 12 ug via INTRAVENOUS
  Administered 2017-01-27: 4 ug via INTRAVENOUS
  Administered 2017-01-27: 8 ug via INTRAVENOUS
  Administered 2017-01-27: 12 ug via INTRAVENOUS

## 2017-01-27 MED ORDER — LIDOCAINE HCL (PF) 1 % IJ SOLN
INTRAMUSCULAR | Status: DC | PRN
  Administered 2017-01-27: 40 mL

## 2017-01-27 MED ORDER — HYDROCODONE-ACETAMINOPHEN 5-325 MG PO TABS: 1.0000 | ORAL_TABLET | Freq: Four times a day (QID) | ORAL | 0 refills | Status: DC | PRN

## 2017-01-27 MED ORDER — PHENYLEPHRINE HCL 10 MG/ML IJ SOLN
INTRAMUSCULAR | Status: DC | PRN
  Administered 2017-01-27 (×2): 100 ug via INTRAVENOUS

## 2017-01-27 MED ORDER — BUPIVACAINE-EPINEPHRINE (PF) 0.5% -1:200000 IJ SOLN
INTRAMUSCULAR | Status: AC
  Filled 2017-01-27: qty 30

## 2017-01-27 MED ORDER — IOPAMIDOL (ISOVUE-M 200) INJECTION 41%
INTRAMUSCULAR | Status: AC
  Filled 2017-01-27: qty 10

## 2017-01-27 MED ORDER — CEFAZOLIN SODIUM-DEXTROSE 1-4 GM/50ML-% IV SOLN
INTRAVENOUS | Status: AC
  Filled 2017-01-27: qty 50

## 2017-01-27 MED ORDER — LACTATED RINGERS IV SOLN
INTRAVENOUS | Status: DC
  Administered 2017-01-27: 13:00:00 via INTRAVENOUS

## 2017-01-27 MED ORDER — LIDOCAINE HCL (PF) 1 % IJ SOLN
INTRAMUSCULAR | Status: AC
Start: 2017-01-27 — End: 2017-01-27
  Filled 2017-01-27: qty 30

## 2017-01-27 SURGICAL SUPPLY — 20 items
CEMENT BONE KYPHON CDS (Cement) ×2 IMPLANT
DERMABOND ADVANCED (GAUZE/BANDAGES/DRESSINGS) ×1
DERMABOND ADVANCED .7 DNX12 (GAUZE/BANDAGES/DRESSINGS) ×1 IMPLANT
DEVICE BIOPSY BONE KYPH (INSTRUMENTS) ×2 IMPLANT
DEVICE BIOPSY BONE KYPHX (INSTRUMENTS) IMPLANT
DRAPE C-ARM XRAY 36X54 (DRAPES) ×2 IMPLANT
DURAPREP 26ML APPLICATOR (WOUND CARE) ×2 IMPLANT
GLOVE BIOGEL PI IND STRL 7.0 (GLOVE) ×2 IMPLANT
GLOVE BIOGEL PI INDICATOR 7.0 (GLOVE) ×2
GLOVE SURG SYN 9.0  PF PI (GLOVE) ×1
GLOVE SURG SYN 9.0 PF PI (GLOVE) ×1 IMPLANT
GOWN SRG 2XL LVL 4 RGLN SLV (GOWNS) ×1 IMPLANT
GOWN STRL NON-REIN 2XL LVL4 (GOWNS) ×1
GOWN STRL REUS W/ TWL LRG LVL3 (GOWN DISPOSABLE) ×1 IMPLANT
GOWN STRL REUS W/TWL LRG LVL3 (GOWN DISPOSABLE) ×1
PACK KYPHOPLASTY (MISCELLANEOUS) ×2 IMPLANT
STRAP SAFETY BODY (MISCELLANEOUS) ×2 IMPLANT
TRAY KYPHOPAK 15/2 EXPRESS (KITS) ×2 IMPLANT
TRAY KYPHOPAK 15/3 EXPRESS 1ST (MISCELLANEOUS) IMPLANT
TRAY KYPHOPAK 20/3 EXPRESS 1ST (MISCELLANEOUS) IMPLANT

## 2017-01-27 NOTE — Discharge Instructions (Addendum)
Take it easy today and tomorrow. Remove Band-Aid's on Thursday and then just okay to shower. Resume more normal activities on Thursday. Pain medicine as directed   AMBULATORY SURGERY  DISCHARGE INSTRUCTIONS   1) The drugs that you were given will stay in your system until tomorrow so for the next 24 hours you should not:  A) Drive an automobile B) Make any legal decisions C) Drink any alcoholic beverage   2) You may resume regular meals tomorrow.  Today it is better to start with liquids and gradually work up to solid foods.  You may eat anything you prefer, but it is better to start with liquids, then soup and crackers, and gradually work up to solid foods.   3) Please notify your doctor immediately if you have any unusual bleeding, trouble breathing, redness and pain at the surgery site, drainage, fever, or pain not relieved by medication.    4) Additional Instructions:   Please contact your physician with any problems or Same Day Surgery at [phone], Monday through Friday 6 am to 4 pm, or [place] [facility] number at [phone].

## 2017-01-27 NOTE — Progress Notes (Signed)
Patient was seen postoperatively in holding area and the same day surgery area. She reported no back pain at present. Sensation to the legs intact and able to walk and care for herself. I discussed with her the fact that we had an advertently started at T11 and with the trocar placed I felt that doing cement fill of that level was appropriate and then we addressed T9 with good fill there. I explained that this should not affect her postoperative care or activity level.

## 2017-01-27 NOTE — Transfer of Care (Signed)
Immediate Anesthesia Transfer of Care Note  Patient: Tina Ingram  Procedure(s) Performed: [name] (N/A )  Patient Location: PACU  Anesthesia Type:General  Level of Consciousness: awake, alert  and oriented  Airway & Oxygen Therapy: Patient Spontanous Breathing and Patient connected to nasal cannula oxygen  Post-op Assessment: Report given to RN and Post -op Vital signs reviewed and stable  Post vital signs: Reviewed and stable  Last Vitals:  Vitals:   01/27/17 1305 01/27/17 1546  BP: (!) 164/82 (!) 92/51  Pulse: 94 85  Resp: 17 17  Temp: 36.7 C 36.7 C  SpO2: 98% (!) 85%    Last Pain:  Vitals:   01/27/17 1305  TempSrc: Tympanic  PainSc: 3          Complications: No apparent anesthesia complications

## 2017-01-27 NOTE — Op Note (Signed)
01/27/2017  3:38 PM  PATIENT:  Tina Ingram  83 y.o. female  PRE-OPERATIVE DIAGNOSIS:  closed wedge compression fracture ninth vertebra  POST-OPERATIVE DIAGNOSIS:  closed wedge compression fracture ninth vertebra and chronic compression T11  PROCEDURE:  Procedure(s) with comments: KYPHOPLASTY-T9 (N/A) - T-9 Initial approach was done at T11 with kyphoplasty performed incidentally  SURGEON: Laurene Footman, MD  ASSISTANTS: None  ANESTHESIA:   local and MAC  EBL:  No intake/output data recorded.  BLOOD ADMINISTERED:none  DRAINS: none   LOCAL MEDICATIONS USED:  MARCAINE    and XYLOCAINE   SPECIMEN:  Source of Specimen:  T9 and T11 vertebral body biopsies  DISPOSITION OF SPECIMEN:  PATHOLOGY  COUNTS:  YES  TOURNIQUET:  * No tourniquets in log *  IMPLANTS: Bone cement  DICTATION: .Dragon Dictation patient brought the operating room and after adequate anesthesia was obtained the patient was placed prone and C-arm was brought in and and good visualization of the vertebral bodies was obtained. Initially local anesthetic was placed subcutaneously and the back was then prepped and draped after S patient education timeout procedure were completed. Initially unfortunately the T11 vertebral body was approached and on subsequent AP view is clear that that was T11 not T9 is a both had compression injuries T11 had been chronic vertebral plasty was performed at that level with a left-sided approach and a extrapedicular fashion with inflation of a balloon and then cement after realizing that this was T11 and not T9 T9 was approached on the left side as well with extrapedicular approach and placement of the trocar to  Pedicular fashion biopsies of both vertebral bodies was obtained followed by drilling and placement of balloon was inflated to the cc and a half on the left side with partial correction at T9 and then cement placed getting good fill the after the cement was set both trochars  removed without any apparent extravasation. The skin was closed with Dermabond  PLAN OF CARE: Discharge to home after PACU  PATIENT DISPOSITION:  PACU - hemodynamically stable.

## 2017-01-27 NOTE — Anesthesia Postprocedure Evaluation (Signed)
Anesthesia Post Note  Patient: Tina Ingram  Procedure(s) Performed: [name] (N/A )  Patient location during evaluation: PACU Anesthesia Type: MAC Level of consciousness: awake and alert Pain management: pain level controlled Vital Signs Assessment: post-procedure vital signs reviewed and stable Respiratory status: spontaneous breathing and respiratory function stable Cardiovascular status: stable Anesthetic complications: no     Last Vitals:  Vitals:   01/27/17 1305 01/27/17 1546  BP: (!) 164/82 (!) 92/51  Pulse: 94 85  Resp: 17 17  Temp: 36.7 C 36.7 C  SpO2: 98% (!) 85%    Last Pain:  Vitals:   01/27/17 1305  TempSrc: Tympanic  PainSc: 3                  KEPHART,WILLIAM K

## 2017-01-27 NOTE — Anesthesia Post-op Follow-up Note (Signed)
Anesthesia QCDR form completed.        

## 2017-01-27 NOTE — H&P (Signed)
Lauris Poag, MD - 01/23/2017 1:45 PM EST  Dalene Carrow, [place] - 01/23/2017 1:45 PM EST    Lauris Poag, MD - 01/23/2017 1:45 PM EST Formatting of this note might be different from the original.  Chief Complaint  Patient presents with  . Back Pain - Lumbar Area   History of the Present Illness: Tina Ingram is a 83 y.o. female here for follow up after having had x-rays and bone scan that shows a compression fracture at T9 that is significant and acute. There is also an older T11 fracture that does not have uptake. She is here today to discuss possible kyphoplasty.  The patient reports today that her back feels painful and swollen in the afternoon each day. Her back symptoms have been ongoing since 05/2016. She says she fell over 3 years ago which is what initially caused her chronic pain. She sought care at a pain management center for this.   She takes aspirin regularly.  I have reviewed past medical, surgical, social and family history, and allergies as documented in the EMR.  Past Medical History: Past Medical History:  Diagnosis Date  . Hypertension   Past Surgical History: Past Surgical History:  Procedure Laterality Date  . APPENDECTOMY  . CAROTID ENDARTERECTOMY Right  . COLONOSCOPY 08/12/2002  Serrated Adenoma  . COLONOSCOPY 09/25/2011, 06/20/2009, 03/26/2007  PH Adenomatous Polyp, FH Colon Polyps (Sister): [facility] 09/2016; Recall Ltr mailed 07/29/2016 (dw); See msg 12/09/2016 (dw)  . polypectomy  . TUBAL LIGATION   Past Family History: Family History  Problem Relation Age of Onset  . High blood pressure (Hypertension) Father  . High blood pressure (Hypertension) Sister  . Colon polyps Sister   Medications: Current Outpatient Medications Ordered in Epic  Medication Sig Dispense Refill  . aspirin 81 MG EC tablet Take 81 mg by mouth once daily.  . hydroCHLOROthiazide (HYDRODIURIL) 12.5 MG tablet Take 1 tablet (12.5 mg total) by mouth once daily. 90  tablet 3   No current Epic-ordered facility-administered medications on file.   Allergies: No Known Allergies   Body mass index is 20.02 kg/m.  Review of Systems:A comprehensive 14 point ROS was performed, reviewed, and the pertinent orthopaedic findings are documented in the HPI.  Vitals:  01/23/17 1341  BP: 110/70   General Physical Examination:  General/Constitutional: No apparent distress: well-nourished and well developed. Eyes: Pupils equal, round with synchronous movement. Lungs: Clear to auscultation HEENT: Normal Vascular: No edema, swelling or tenderness, except as noted in detailed exam. Cardiac: Heart rate and rhythm is regular. Integumentary: No impressive skin lesions present, except as noted in detailed exam. Neuro/Psych: Normal mood and affect, oriented to person, place and time.  Musculoskeletal Examination: 1-2 beats of clonus at the ankles.  Lungs are clear. Heart rate and rhythm is normal. HEENT is normal.  Radiographs: No new imaging studies were obtained or reviewed today.  Assessment: ICD-10-CM ICD-9-CM  1. Closed wedge compression fracture of ninth thoracic vertebra, initial encounter (CMS-HCC) S22.070A 805.2   The patient has clinical findings of T9 compression fracture, subacute, post persistent severe pain.  Plan: We will proceed with kyphoplasty on Tuesday, 01/27/2017.   Surgical Risks:  The nature of the condition and the proposed procedure has been reviewed in detail with the patient. Surgical versus non-surgical options and prognosis for recovery have been reviewed and the inherent risks and benefits of each have been discussed including the risks of infection, bleeding, injury to nerves / blood vessels / tendons, incomplete relief  of symptoms, persisting pain and / or stiffness, loss of function, complex regional pain syndrome, failure of procedure, as appropriate.  Teeth: Normal

## 2017-01-27 NOTE — Anesthesia Preprocedure Evaluation (Signed)
Anesthesia Evaluation  Patient identified by MRN, date of birth, ID band Patient awake    Reviewed: Allergy & Precautions, H&P , NPO status , Patient's Chart, lab work & pertinent test results, reviewed documented beta blocker date and time   Airway Mallampati: II  TM Distance: >3 FB Neck ROM: full    Dental no notable dental hx. (+) Teeth Intact   Pulmonary neg pulmonary ROS,    Pulmonary exam normal breath sounds clear to auscultation       Cardiovascular Exercise Tolerance: Good hypertension, negative cardio ROS   Rhythm:regular Rate:Normal     Neuro/Psych negative neurological ROS  negative psych ROS   GI/Hepatic negative GI ROS, Neg liver ROS,   Endo/Other  negative endocrine ROSdiabetes  Renal/GU      Musculoskeletal   Abdominal   Peds  Hematology negative hematology ROS (+)   Anesthesia Other Findings   Reproductive/Obstetrics negative OB ROS                             Anesthesia Physical Anesthesia Plan  ASA: II  Anesthesia Plan: MAC   Post-op Pain Management:    Induction:   PONV Risk Score and Plan:   Airway Management Planned:   Additional Equipment:   Intra-op Plan:   Post-operative Plan:   Informed Consent: I have reviewed the patients History and Physical, chart, labs and discussed the procedure including the risks, benefits and alternatives for the proposed anesthesia with the patient or authorized representative who has indicated his/her understanding and acceptance.     Plan Discussed with: CRNA  Anesthesia Plan Comments:         Anesthesia Quick Evaluation  

## 2017-01-27 NOTE — H&P (Signed)
Reviewed paper H+P, will be scanned into chart. No changes noted.  

## 2017-01-28 ENCOUNTER — Encounter: Payer: Self-pay | Admitting: Orthopedic Surgery

## 2017-01-30 LAB — SURGICAL PATHOLOGY

## 2017-05-25 ENCOUNTER — Other Ambulatory Visit: Payer: Self-pay | Admitting: Family Medicine

## 2017-05-25 DIAGNOSIS — Z1231 Encounter for screening mammogram for malignant neoplasm of breast: Secondary | ICD-10-CM

## 2017-07-15 ENCOUNTER — Ambulatory Visit
Admission: RE | Admit: 2017-07-15 | Discharge: 2017-07-15 | Disposition: A | Discharge status: Home / Self Care | Payer: Medicare Other | Source: Ambulatory Visit | Attending: Family Medicine | Admitting: Family Medicine

## 2017-07-15 DIAGNOSIS — Z1231 Encounter for screening mammogram for malignant neoplasm of breast: Secondary | ICD-10-CM

## 2017-08-26 ENCOUNTER — Other Ambulatory Visit: Payer: Self-pay | Admitting: Orthopedic Surgery

## 2017-08-26 DIAGNOSIS — Z9889 Other specified postprocedural states: Secondary | ICD-10-CM

## 2017-08-26 DIAGNOSIS — S22070A Wedge compression fracture of T9-T10 vertebra, initial encounter for closed fracture: Secondary | ICD-10-CM

## 2017-09-09 ENCOUNTER — Ambulatory Visit
Admission: RE | Admit: 2017-09-09 | Discharge: 2017-09-09 | Disposition: A | Discharge status: Home / Self Care | Payer: Medicare Other | Source: Ambulatory Visit | Attending: Orthopedic Surgery | Admitting: Orthopedic Surgery

## 2017-09-09 DIAGNOSIS — M4804 Spinal stenosis, thoracic region: Secondary | ICD-10-CM | POA: Insufficient documentation

## 2017-09-09 DIAGNOSIS — Z9889 Other specified postprocedural states: Secondary | ICD-10-CM | POA: Diagnosis not present

## 2017-09-09 DIAGNOSIS — S22079A Unspecified fracture of T9-T10 vertebra, initial encounter for closed fracture: Secondary | ICD-10-CM | POA: Diagnosis present

## 2017-09-09 DIAGNOSIS — M4854XA Collapsed vertebra, not elsewhere classified, thoracic region, initial encounter for fracture: Secondary | ICD-10-CM | POA: Insufficient documentation

## 2017-09-09 DIAGNOSIS — S22070A Wedge compression fracture of T9-T10 vertebra, initial encounter for closed fracture: Secondary | ICD-10-CM

## 2017-12-09 DIAGNOSIS — M810 Age-related osteoporosis without current pathological fracture: Secondary | ICD-10-CM | POA: Insufficient documentation

## 2017-12-09 DIAGNOSIS — E559 Vitamin D deficiency, unspecified: Secondary | ICD-10-CM | POA: Insufficient documentation

## 2018-02-23 DIAGNOSIS — H1851 Endothelial corneal dystrophy: Secondary | ICD-10-CM | POA: Diagnosis not present

## 2018-02-23 DIAGNOSIS — H43822 Vitreomacular adhesion, left eye: Secondary | ICD-10-CM | POA: Diagnosis not present

## 2018-03-15 DIAGNOSIS — I10 Essential (primary) hypertension: Secondary | ICD-10-CM | POA: Diagnosis not present

## 2018-03-15 DIAGNOSIS — N183 Chronic kidney disease, stage 3 (moderate): Secondary | ICD-10-CM | POA: Diagnosis not present

## 2018-03-22 DIAGNOSIS — I739 Peripheral vascular disease, unspecified: Secondary | ICD-10-CM | POA: Diagnosis not present

## 2018-03-22 DIAGNOSIS — Z0000 Encounter for general adult medical examination without abnormal findings: Secondary | ICD-10-CM | POA: Diagnosis not present

## 2018-04-06 DIAGNOSIS — L820 Inflamed seborrheic keratosis: Secondary | ICD-10-CM | POA: Diagnosis not present

## 2018-04-06 DIAGNOSIS — L821 Other seborrheic keratosis: Secondary | ICD-10-CM | POA: Diagnosis not present

## 2018-04-06 DIAGNOSIS — L578 Other skin changes due to chronic exposure to nonionizing radiation: Secondary | ICD-10-CM | POA: Diagnosis not present

## 2018-04-06 DIAGNOSIS — L720 Epidermal cyst: Secondary | ICD-10-CM | POA: Diagnosis not present

## 2018-04-06 DIAGNOSIS — L570 Actinic keratosis: Secondary | ICD-10-CM | POA: Diagnosis not present

## 2018-05-26 DIAGNOSIS — M810 Age-related osteoporosis without current pathological fracture: Secondary | ICD-10-CM | POA: Diagnosis not present

## 2018-05-26 DIAGNOSIS — E559 Vitamin D deficiency, unspecified: Secondary | ICD-10-CM | POA: Diagnosis not present

## 2018-06-10 DIAGNOSIS — H43822 Vitreomacular adhesion, left eye: Secondary | ICD-10-CM | POA: Diagnosis not present

## 2018-06-17 ENCOUNTER — Other Ambulatory Visit: Payer: Self-pay | Admitting: Family Medicine

## 2018-06-17 DIAGNOSIS — Z1231 Encounter for screening mammogram for malignant neoplasm of breast: Secondary | ICD-10-CM

## 2018-07-07 DIAGNOSIS — L570 Actinic keratosis: Secondary | ICD-10-CM | POA: Diagnosis not present

## 2018-07-07 DIAGNOSIS — L578 Other skin changes due to chronic exposure to nonionizing radiation: Secondary | ICD-10-CM | POA: Diagnosis not present

## 2018-07-07 DIAGNOSIS — L820 Inflamed seborrheic keratosis: Secondary | ICD-10-CM | POA: Diagnosis not present

## 2018-07-07 DIAGNOSIS — L821 Other seborrheic keratosis: Secondary | ICD-10-CM | POA: Diagnosis not present

## 2018-07-29 ENCOUNTER — Ambulatory Visit
Admission: RE | Admit: 2018-07-29 | Discharge: 2018-07-29 | Disposition: A | Discharge status: Home / Self Care | Payer: PPO | Source: Ambulatory Visit | Attending: Family Medicine | Admitting: Family Medicine

## 2018-07-29 ENCOUNTER — Other Ambulatory Visit: Payer: Self-pay

## 2018-07-29 DIAGNOSIS — Z1231 Encounter for screening mammogram for malignant neoplasm of breast: Secondary | ICD-10-CM | POA: Diagnosis not present

## 2018-08-27 DIAGNOSIS — M62830 Muscle spasm of back: Secondary | ICD-10-CM | POA: Diagnosis not present

## 2018-08-27 DIAGNOSIS — M546 Pain in thoracic spine: Secondary | ICD-10-CM | POA: Diagnosis not present

## 2018-08-27 DIAGNOSIS — G8929 Other chronic pain: Secondary | ICD-10-CM | POA: Diagnosis not present

## 2018-09-02 DIAGNOSIS — M6281 Muscle weakness (generalized): Secondary | ICD-10-CM | POA: Diagnosis not present

## 2018-09-02 DIAGNOSIS — M4125 Other idiopathic scoliosis, thoracolumbar region: Secondary | ICD-10-CM | POA: Diagnosis not present

## 2018-09-02 DIAGNOSIS — M546 Pain in thoracic spine: Secondary | ICD-10-CM | POA: Diagnosis not present

## 2018-09-06 DIAGNOSIS — L570 Actinic keratosis: Secondary | ICD-10-CM | POA: Diagnosis not present

## 2018-09-06 DIAGNOSIS — D692 Other nonthrombocytopenic purpura: Secondary | ICD-10-CM | POA: Diagnosis not present

## 2018-09-06 DIAGNOSIS — L820 Inflamed seborrheic keratosis: Secondary | ICD-10-CM | POA: Diagnosis not present

## 2018-09-06 DIAGNOSIS — L578 Other skin changes due to chronic exposure to nonionizing radiation: Secondary | ICD-10-CM | POA: Diagnosis not present

## 2018-09-06 DIAGNOSIS — L821 Other seborrheic keratosis: Secondary | ICD-10-CM | POA: Diagnosis not present

## 2018-09-06 DIAGNOSIS — I8390 Asymptomatic varicose veins of unspecified lower extremity: Secondary | ICD-10-CM | POA: Diagnosis not present

## 2018-09-08 DIAGNOSIS — M546 Pain in thoracic spine: Secondary | ICD-10-CM | POA: Diagnosis not present

## 2018-09-08 DIAGNOSIS — M4125 Other idiopathic scoliosis, thoracolumbar region: Secondary | ICD-10-CM | POA: Diagnosis not present

## 2018-09-10 DIAGNOSIS — M6281 Muscle weakness (generalized): Secondary | ICD-10-CM | POA: Diagnosis not present

## 2018-09-10 DIAGNOSIS — M546 Pain in thoracic spine: Secondary | ICD-10-CM | POA: Diagnosis not present

## 2018-09-14 DIAGNOSIS — M546 Pain in thoracic spine: Secondary | ICD-10-CM | POA: Diagnosis not present

## 2018-09-14 DIAGNOSIS — M6281 Muscle weakness (generalized): Secondary | ICD-10-CM | POA: Diagnosis not present

## 2018-09-17 DIAGNOSIS — M546 Pain in thoracic spine: Secondary | ICD-10-CM | POA: Diagnosis not present

## 2018-09-17 DIAGNOSIS — M6281 Muscle weakness (generalized): Secondary | ICD-10-CM | POA: Diagnosis not present

## 2018-09-21 DIAGNOSIS — M546 Pain in thoracic spine: Secondary | ICD-10-CM | POA: Diagnosis not present

## 2018-09-21 DIAGNOSIS — M6281 Muscle weakness (generalized): Secondary | ICD-10-CM | POA: Diagnosis not present

## 2018-09-22 DIAGNOSIS — N183 Chronic kidney disease, stage 3 (moderate): Secondary | ICD-10-CM | POA: Diagnosis not present

## 2018-09-23 DIAGNOSIS — M6281 Muscle weakness (generalized): Secondary | ICD-10-CM | POA: Diagnosis not present

## 2018-09-23 DIAGNOSIS — M546 Pain in thoracic spine: Secondary | ICD-10-CM | POA: Diagnosis not present

## 2018-09-23 DIAGNOSIS — M4125 Other idiopathic scoliosis, thoracolumbar region: Secondary | ICD-10-CM | POA: Diagnosis not present

## 2018-09-28 DIAGNOSIS — D649 Anemia, unspecified: Secondary | ICD-10-CM | POA: Diagnosis not present

## 2018-09-28 DIAGNOSIS — M546 Pain in thoracic spine: Secondary | ICD-10-CM | POA: Diagnosis not present

## 2018-09-28 DIAGNOSIS — M4125 Other idiopathic scoliosis, thoracolumbar region: Secondary | ICD-10-CM | POA: Diagnosis not present

## 2018-09-28 DIAGNOSIS — I10 Essential (primary) hypertension: Secondary | ICD-10-CM | POA: Diagnosis not present

## 2018-09-28 DIAGNOSIS — N183 Chronic kidney disease, stage 3 (moderate): Secondary | ICD-10-CM | POA: Diagnosis not present

## 2018-09-28 DIAGNOSIS — F5101 Primary insomnia: Secondary | ICD-10-CM | POA: Diagnosis not present

## 2018-09-28 DIAGNOSIS — M6281 Muscle weakness (generalized): Secondary | ICD-10-CM | POA: Diagnosis not present

## 2018-09-28 DIAGNOSIS — M810 Age-related osteoporosis without current pathological fracture: Secondary | ICD-10-CM | POA: Diagnosis not present

## 2018-09-30 DIAGNOSIS — M4125 Other idiopathic scoliosis, thoracolumbar region: Secondary | ICD-10-CM | POA: Diagnosis not present

## 2018-09-30 DIAGNOSIS — M6281 Muscle weakness (generalized): Secondary | ICD-10-CM | POA: Diagnosis not present

## 2018-10-05 DIAGNOSIS — M6281 Muscle weakness (generalized): Secondary | ICD-10-CM | POA: Diagnosis not present

## 2018-10-05 DIAGNOSIS — M4125 Other idiopathic scoliosis, thoracolumbar region: Secondary | ICD-10-CM | POA: Diagnosis not present

## 2018-10-07 DIAGNOSIS — M4125 Other idiopathic scoliosis, thoracolumbar region: Secondary | ICD-10-CM | POA: Diagnosis not present

## 2018-10-07 DIAGNOSIS — M546 Pain in thoracic spine: Secondary | ICD-10-CM | POA: Diagnosis not present

## 2018-10-07 DIAGNOSIS — M6281 Muscle weakness (generalized): Secondary | ICD-10-CM | POA: Diagnosis not present

## 2018-10-12 DIAGNOSIS — M546 Pain in thoracic spine: Secondary | ICD-10-CM | POA: Diagnosis not present

## 2018-10-12 DIAGNOSIS — M6281 Muscle weakness (generalized): Secondary | ICD-10-CM | POA: Diagnosis not present

## 2018-10-12 DIAGNOSIS — M4125 Other idiopathic scoliosis, thoracolumbar region: Secondary | ICD-10-CM | POA: Diagnosis not present

## 2018-10-14 DIAGNOSIS — M546 Pain in thoracic spine: Secondary | ICD-10-CM | POA: Diagnosis not present

## 2018-10-14 DIAGNOSIS — M4125 Other idiopathic scoliosis, thoracolumbar region: Secondary | ICD-10-CM | POA: Diagnosis not present

## 2018-10-14 DIAGNOSIS — M6281 Muscle weakness (generalized): Secondary | ICD-10-CM | POA: Diagnosis not present

## 2018-11-08 DIAGNOSIS — L578 Other skin changes due to chronic exposure to nonionizing radiation: Secondary | ICD-10-CM | POA: Diagnosis not present

## 2018-11-08 DIAGNOSIS — L821 Other seborrheic keratosis: Secondary | ICD-10-CM | POA: Diagnosis not present

## 2018-11-08 DIAGNOSIS — L570 Actinic keratosis: Secondary | ICD-10-CM | POA: Diagnosis not present

## 2018-11-08 DIAGNOSIS — L820 Inflamed seborrheic keratosis: Secondary | ICD-10-CM | POA: Diagnosis not present

## 2018-12-14 DIAGNOSIS — H43822 Vitreomacular adhesion, left eye: Secondary | ICD-10-CM | POA: Diagnosis not present

## 2018-12-23 DIAGNOSIS — D649 Anemia, unspecified: Secondary | ICD-10-CM | POA: Diagnosis not present

## 2018-12-23 DIAGNOSIS — N183 Chronic kidney disease, stage 3 unspecified: Secondary | ICD-10-CM | POA: Diagnosis not present

## 2018-12-30 ENCOUNTER — Other Ambulatory Visit: Payer: Self-pay

## 2018-12-30 ENCOUNTER — Emergency Department
Admission: EM | Admit: 2018-12-30 | Discharge: 2018-12-30 | Disposition: A | Discharge status: Other Acute Inpatient Hospital | Payer: PPO | Attending: Student | Admitting: Student

## 2018-12-30 ENCOUNTER — Emergency Department: Payer: PPO

## 2018-12-30 ENCOUNTER — Encounter: Payer: Self-pay | Admitting: Intensive Care

## 2018-12-30 DIAGNOSIS — J90 Pleural effusion, not elsewhere classified: Secondary | ICD-10-CM | POA: Diagnosis not present

## 2018-12-30 DIAGNOSIS — R918 Other nonspecific abnormal finding of lung field: Secondary | ICD-10-CM | POA: Diagnosis not present

## 2018-12-30 DIAGNOSIS — M1990 Unspecified osteoarthritis, unspecified site: Secondary | ICD-10-CM | POA: Diagnosis not present

## 2018-12-30 DIAGNOSIS — S4991XA Unspecified injury of right shoulder and upper arm, initial encounter: Secondary | ICD-10-CM | POA: Diagnosis not present

## 2018-12-30 DIAGNOSIS — G8911 Acute pain due to trauma: Secondary | ICD-10-CM | POA: Diagnosis not present

## 2018-12-30 DIAGNOSIS — R000 Tachycardia, unspecified: Secondary | ICD-10-CM | POA: Diagnosis not present

## 2018-12-30 DIAGNOSIS — M19011 Primary osteoarthritis, right shoulder: Secondary | ICD-10-CM | POA: Diagnosis not present

## 2018-12-30 DIAGNOSIS — S0990XA Unspecified injury of head, initial encounter: Secondary | ICD-10-CM | POA: Diagnosis present

## 2018-12-30 DIAGNOSIS — Y939 Activity, unspecified: Secondary | ICD-10-CM | POA: Diagnosis not present

## 2018-12-30 DIAGNOSIS — M00172 Pneumococcal arthritis, left ankle and foot: Secondary | ICD-10-CM | POA: Diagnosis not present

## 2018-12-30 DIAGNOSIS — J9811 Atelectasis: Secondary | ICD-10-CM | POA: Diagnosis not present

## 2018-12-30 DIAGNOSIS — Z8673 Personal history of transient ischemic attack (TIA), and cerebral infarction without residual deficits: Secondary | ICD-10-CM | POA: Diagnosis not present

## 2018-12-30 DIAGNOSIS — R402412 Glasgow coma scale score 13-15, at arrival to emergency department: Secondary | ICD-10-CM | POA: Diagnosis not present

## 2018-12-30 DIAGNOSIS — J948 Other specified pleural conditions: Secondary | ICD-10-CM | POA: Diagnosis not present

## 2018-12-30 DIAGNOSIS — I10 Essential (primary) hypertension: Secondary | ICD-10-CM | POA: Insufficient documentation

## 2018-12-30 DIAGNOSIS — M545 Low back pain: Secondary | ICD-10-CM | POA: Diagnosis not present

## 2018-12-30 DIAGNOSIS — Z7982 Long term (current) use of aspirin: Secondary | ICD-10-CM | POA: Diagnosis not present

## 2018-12-30 DIAGNOSIS — S299XXA Unspecified injury of thorax, initial encounter: Secondary | ICD-10-CM | POA: Diagnosis not present

## 2018-12-30 DIAGNOSIS — Y92410 Unspecified street and highway as the place of occurrence of the external cause: Secondary | ICD-10-CM | POA: Diagnosis not present

## 2018-12-30 DIAGNOSIS — N1831 Chronic kidney disease, stage 3a: Secondary | ICD-10-CM | POA: Diagnosis not present

## 2018-12-30 DIAGNOSIS — Z7983 Long term (current) use of bisphosphonates: Secondary | ICD-10-CM | POA: Diagnosis not present

## 2018-12-30 DIAGNOSIS — E559 Vitamin D deficiency, unspecified: Secondary | ICD-10-CM | POA: Diagnosis not present

## 2018-12-30 DIAGNOSIS — S42101A Fracture of unspecified part of scapula, right shoulder, initial encounter for closed fracture: Secondary | ICD-10-CM | POA: Diagnosis not present

## 2018-12-30 DIAGNOSIS — S065X9A Traumatic subdural hemorrhage with loss of consciousness of unspecified duration, initial encounter: Secondary | ICD-10-CM | POA: Diagnosis not present

## 2018-12-30 DIAGNOSIS — N183 Chronic kidney disease, stage 3 unspecified: Secondary | ICD-10-CM | POA: Diagnosis not present

## 2018-12-30 DIAGNOSIS — M25511 Pain in right shoulder: Secondary | ICD-10-CM | POA: Diagnosis not present

## 2018-12-30 DIAGNOSIS — F17210 Nicotine dependence, cigarettes, uncomplicated: Secondary | ICD-10-CM | POA: Insufficient documentation

## 2018-12-30 DIAGNOSIS — S3992XA Unspecified injury of lower back, initial encounter: Secondary | ICD-10-CM | POA: Diagnosis not present

## 2018-12-30 DIAGNOSIS — Z20828 Contact with and (suspected) exposure to other viral communicable diseases: Secondary | ICD-10-CM | POA: Insufficient documentation

## 2018-12-30 DIAGNOSIS — S2231XA Fracture of one rib, right side, initial encounter for closed fracture: Secondary | ICD-10-CM | POA: Diagnosis not present

## 2018-12-30 DIAGNOSIS — N390 Urinary tract infection, site not specified: Secondary | ICD-10-CM | POA: Diagnosis not present

## 2018-12-30 DIAGNOSIS — M5136 Other intervertebral disc degeneration, lumbar region: Secondary | ICD-10-CM | POA: Diagnosis not present

## 2018-12-30 DIAGNOSIS — Y929 Unspecified place or not applicable: Secondary | ICD-10-CM | POA: Diagnosis not present

## 2018-12-30 DIAGNOSIS — R41841 Cognitive communication deficit: Secondary | ICD-10-CM | POA: Diagnosis not present

## 2018-12-30 DIAGNOSIS — Z03818 Encounter for observation for suspected exposure to other biological agents ruled out: Secondary | ICD-10-CM | POA: Diagnosis not present

## 2018-12-30 DIAGNOSIS — R52 Pain, unspecified: Secondary | ICD-10-CM | POA: Diagnosis not present

## 2018-12-30 DIAGNOSIS — Y999 Unspecified external cause status: Secondary | ICD-10-CM | POA: Diagnosis not present

## 2018-12-30 DIAGNOSIS — M419 Scoliosis, unspecified: Secondary | ICD-10-CM | POA: Diagnosis not present

## 2018-12-30 DIAGNOSIS — I951 Orthostatic hypotension: Secondary | ICD-10-CM | POA: Diagnosis not present

## 2018-12-30 DIAGNOSIS — S2241XA Multiple fractures of ribs, right side, initial encounter for closed fracture: Secondary | ICD-10-CM

## 2018-12-30 DIAGNOSIS — D508 Other iron deficiency anemias: Secondary | ICD-10-CM | POA: Diagnosis not present

## 2018-12-30 DIAGNOSIS — N644 Mastodynia: Secondary | ICD-10-CM | POA: Diagnosis not present

## 2018-12-30 DIAGNOSIS — M4316 Spondylolisthesis, lumbar region: Secondary | ICD-10-CM | POA: Diagnosis not present

## 2018-12-30 DIAGNOSIS — S270XXA Traumatic pneumothorax, initial encounter: Secondary | ICD-10-CM | POA: Diagnosis not present

## 2018-12-30 DIAGNOSIS — M810 Age-related osteoporosis without current pathological fracture: Secondary | ICD-10-CM | POA: Diagnosis not present

## 2018-12-30 DIAGNOSIS — N179 Acute kidney failure, unspecified: Secondary | ICD-10-CM | POA: Diagnosis not present

## 2018-12-30 DIAGNOSIS — I129 Hypertensive chronic kidney disease with stage 1 through stage 4 chronic kidney disease, or unspecified chronic kidney disease: Secondary | ICD-10-CM | POA: Diagnosis not present

## 2018-12-30 DIAGNOSIS — Z79899 Other long term (current) drug therapy: Secondary | ICD-10-CM | POA: Diagnosis not present

## 2018-12-30 DIAGNOSIS — L299 Pruritus, unspecified: Secondary | ICD-10-CM | POA: Diagnosis not present

## 2018-12-30 DIAGNOSIS — R0902 Hypoxemia: Secondary | ICD-10-CM | POA: Diagnosis not present

## 2018-12-30 DIAGNOSIS — T1490XA Injury, unspecified, initial encounter: Secondary | ICD-10-CM | POA: Diagnosis not present

## 2018-12-30 DIAGNOSIS — J942 Hemothorax: Secondary | ICD-10-CM | POA: Diagnosis not present

## 2018-12-30 DIAGNOSIS — M5126 Other intervertebral disc displacement, lumbar region: Secondary | ICD-10-CM | POA: Diagnosis not present

## 2018-12-30 DIAGNOSIS — S199XXA Unspecified injury of neck, initial encounter: Secondary | ICD-10-CM | POA: Diagnosis not present

## 2018-12-30 DIAGNOSIS — Z7401 Bed confinement status: Secondary | ICD-10-CM | POA: Diagnosis not present

## 2018-12-30 DIAGNOSIS — K5900 Constipation, unspecified: Secondary | ICD-10-CM | POA: Diagnosis not present

## 2018-12-30 DIAGNOSIS — S272XXA Traumatic hemopneumothorax, initial encounter: Secondary | ICD-10-CM | POA: Diagnosis not present

## 2018-12-30 DIAGNOSIS — S065X0A Traumatic subdural hemorrhage without loss of consciousness, initial encounter: Secondary | ICD-10-CM | POA: Diagnosis not present

## 2018-12-30 DIAGNOSIS — S3991XA Unspecified injury of abdomen, initial encounter: Secondary | ICD-10-CM | POA: Diagnosis not present

## 2018-12-30 DIAGNOSIS — F5101 Primary insomnia: Secondary | ICD-10-CM | POA: Diagnosis not present

## 2018-12-30 DIAGNOSIS — G936 Cerebral edema: Secondary | ICD-10-CM | POA: Diagnosis not present

## 2018-12-30 DIAGNOSIS — Y33XXXA Other specified events, undetermined intent, initial encounter: Secondary | ICD-10-CM | POA: Diagnosis not present

## 2018-12-30 LAB — CBC WITH DIFFERENTIAL/PLATELET
Abs Immature Granulocytes: 0.15 10*3/uL — ABNORMAL HIGH (ref 0.00–0.07)
Basophils Absolute: 0.1 10*3/uL (ref 0.0–0.1)
Basophils Relative: 0 %
Eosinophils Absolute: 0 10*3/uL (ref 0.0–0.5)
Eosinophils Relative: 0 %
HCT: 43.9 % (ref 36.0–46.0)
Hemoglobin: 14.2 g/dL (ref 12.0–15.0)
Immature Granulocytes: 1 %
Lymphocytes Relative: 4 %
Lymphs Abs: 0.9 10*3/uL (ref 0.7–4.0)
MCH: 27.8 pg (ref 26.0–34.0)
MCHC: 32.3 g/dL (ref 30.0–36.0)
MCV: 85.9 fL (ref 80.0–100.0)
Monocytes Absolute: 0.9 10*3/uL (ref 0.1–1.0)
Monocytes Relative: 4 %
Neutro Abs: 21.3 10*3/uL — ABNORMAL HIGH (ref 1.7–7.7)
Neutrophils Relative %: 91 %
Platelets: 1240 10*3/uL (ref 150–400)
RBC: 5.11 MIL/uL (ref 3.87–5.11)
RDW: 15.7 % — ABNORMAL HIGH (ref 11.5–15.5)
WBC: 23.3 10*3/uL — ABNORMAL HIGH (ref 4.0–10.5)
nRBC: 0 % (ref 0.0–0.2)

## 2018-12-30 LAB — COMPREHENSIVE METABOLIC PANEL
ALT: 14 U/L (ref 0–44)
AST: 29 U/L (ref 15–41)
Albumin: 4.3 g/dL (ref 3.5–5.0)
Alkaline Phosphatase: 60 U/L (ref 38–126)
Anion gap: 12 (ref 5–15)
BUN: 18 mg/dL (ref 8–23)
CO2: 29 mmol/L (ref 22–32)
Calcium: 10.3 mg/dL (ref 8.9–10.3)
Chloride: 99 mmol/L (ref 98–111)
Creatinine, Ser: 1.23 mg/dL — ABNORMAL HIGH (ref 0.44–1.00)
GFR calc Af Amer: 47 mL/min — ABNORMAL LOW (ref >60)
GFR calc non Af Amer: 41 mL/min — ABNORMAL LOW (ref >60)
Glucose, Bld: 159 mg/dL — ABNORMAL HIGH (ref 70–99)
Potassium: 3.4 mmol/L — ABNORMAL LOW (ref 3.5–5.1)
Sodium: 140 mmol/L (ref 135–145)
Total Bilirubin: 1.3 mg/dL — ABNORMAL HIGH (ref 0.3–1.2)
Total Protein: 7.3 g/dL (ref 6.5–8.1)

## 2018-12-30 LAB — TYPE AND SCREEN
ABO/RH(D): O POS
Antibody Screen: NEGATIVE

## 2018-12-30 MED ORDER — NICARDIPINE HCL IN NACL 20-0.86 MG/200ML-% IV SOLN
3.0000 mg/h | INTRAVENOUS | Status: DC
  Filled 2018-12-30: qty 200

## 2018-12-30 MED ORDER — LEVETIRACETAM IN NACL 1000 MG/100ML IV SOLN
1000.0000 mg | Freq: Once | INTRAVENOUS | Status: AC
  Administered 2018-12-30: 22:00:00 1000 mg via INTRAVENOUS
  Filled 2018-12-30: qty 100

## 2018-12-30 NOTE — ED Notes (Signed)
Family at bedside. 

## 2018-12-30 NOTE — ED Notes (Signed)
PA Roderic Palau spoke with grandson regarding pt's status and plan of care. Pt informed of [facility]'s visitor restrictions.

## 2018-12-30 NOTE — ED Notes (Signed)
Pt signed consent to transfer paper copy given to Secretary to place in pt's packet for transfer.
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
## 2018-12-30 NOTE — ED Notes (Signed)
EDP at bedside  

## 2018-12-30 NOTE — ED Notes (Signed)
Transport at bedside  

## 2018-12-30 NOTE — ED Notes (Signed)
Let Roderic Palau, PA know of critical platelet count of 1,240.

## 2018-12-31 DIAGNOSIS — J942 Hemothorax: Secondary | ICD-10-CM | POA: Insufficient documentation

## 2018-12-31 DIAGNOSIS — S2241XA Multiple fractures of ribs, right side, initial encounter for closed fracture: Secondary | ICD-10-CM | POA: Insufficient documentation

## 2018-12-31 LAB — SARS CORONAVIRUS 2 (TAT 6-24 HRS): SARS Coronavirus 2: NEGATIVE

## 2019-01-15 ENCOUNTER — Emergency Department
Admission: EM | Admit: 2019-01-15 | Discharge: 2019-01-15 | Disposition: A | Discharge status: Other Acute Inpatient Hospital | Payer: PPO | Attending: Emergency Medicine | Admitting: Emergency Medicine

## 2019-01-15 ENCOUNTER — Emergency Department: Payer: PPO

## 2019-01-15 ENCOUNTER — Other Ambulatory Visit: Payer: Self-pay

## 2019-01-15 DIAGNOSIS — E568 Deficiency of other vitamins: Secondary | ICD-10-CM | POA: Diagnosis not present

## 2019-01-15 DIAGNOSIS — Z03818 Encounter for observation for suspected exposure to other biological agents ruled out: Secondary | ICD-10-CM | POA: Diagnosis not present

## 2019-01-15 DIAGNOSIS — R2681 Unsteadiness on feet: Secondary | ICD-10-CM | POA: Diagnosis not present

## 2019-01-15 DIAGNOSIS — M62838 Other muscle spasm: Secondary | ICD-10-CM | POA: Diagnosis not present

## 2019-01-15 DIAGNOSIS — G4700 Insomnia, unspecified: Secondary | ICD-10-CM | POA: Diagnosis not present

## 2019-01-15 DIAGNOSIS — Z20822 Contact with and (suspected) exposure to covid-19: Secondary | ICD-10-CM | POA: Diagnosis not present

## 2019-01-15 DIAGNOSIS — R531 Weakness: Secondary | ICD-10-CM | POA: Diagnosis not present

## 2019-01-15 DIAGNOSIS — S2241XD Multiple fractures of ribs, right side, subsequent encounter for fracture with routine healing: Secondary | ICD-10-CM | POA: Diagnosis not present

## 2019-01-15 DIAGNOSIS — I129 Hypertensive chronic kidney disease with stage 1 through stage 4 chronic kidney disease, or unspecified chronic kidney disease: Secondary | ICD-10-CM | POA: Diagnosis not present

## 2019-01-15 DIAGNOSIS — Z8659 Personal history of other mental and behavioral disorders: Secondary | ICD-10-CM | POA: Diagnosis not present

## 2019-01-15 DIAGNOSIS — I10 Essential (primary) hypertension: Secondary | ICD-10-CM | POA: Diagnosis not present

## 2019-01-15 DIAGNOSIS — M6281 Muscle weakness (generalized): Secondary | ICD-10-CM | POA: Diagnosis not present

## 2019-01-15 DIAGNOSIS — R0902 Hypoxemia: Secondary | ICD-10-CM | POA: Diagnosis not present

## 2019-01-15 DIAGNOSIS — R488 Other symbolic dysfunctions: Secondary | ICD-10-CM | POA: Diagnosis not present

## 2019-01-15 DIAGNOSIS — R109 Unspecified abdominal pain: Secondary | ICD-10-CM | POA: Diagnosis not present

## 2019-01-15 DIAGNOSIS — S065X9S Traumatic subdural hemorrhage with loss of consciousness of unspecified duration, sequela: Secondary | ICD-10-CM | POA: Diagnosis not present

## 2019-01-15 DIAGNOSIS — R402213 Coma scale, best verbal response, none, at hospital admission: Secondary | ICD-10-CM | POA: Diagnosis not present

## 2019-01-15 DIAGNOSIS — S065X9A Traumatic subdural hemorrhage with loss of consciousness of unspecified duration, initial encounter: Secondary | ICD-10-CM | POA: Diagnosis not present

## 2019-01-15 DIAGNOSIS — E559 Vitamin D deficiency, unspecified: Secondary | ICD-10-CM | POA: Diagnosis not present

## 2019-01-15 DIAGNOSIS — R402113 Coma scale, eyes open, never, at hospital admission: Secondary | ICD-10-CM | POA: Diagnosis not present

## 2019-01-15 DIAGNOSIS — S065XAA Traumatic subdural hemorrhage with loss of consciousness status unknown, initial encounter: Secondary | ICD-10-CM

## 2019-01-15 DIAGNOSIS — R0689 Other abnormalities of breathing: Secondary | ICD-10-CM | POA: Diagnosis not present

## 2019-01-15 DIAGNOSIS — N183 Chronic kidney disease, stage 3 unspecified: Secondary | ICD-10-CM | POA: Diagnosis not present

## 2019-01-15 DIAGNOSIS — I6200 Nontraumatic subdural hemorrhage, unspecified: Secondary | ICD-10-CM | POA: Diagnosis not present

## 2019-01-15 DIAGNOSIS — Y33XXXA Other specified events, undetermined intent, initial encounter: Secondary | ICD-10-CM | POA: Diagnosis not present

## 2019-01-15 DIAGNOSIS — Z7982 Long term (current) use of aspirin: Secondary | ICD-10-CM | POA: Diagnosis not present

## 2019-01-15 DIAGNOSIS — K219 Gastro-esophageal reflux disease without esophagitis: Secondary | ICD-10-CM | POA: Diagnosis not present

## 2019-01-15 DIAGNOSIS — M549 Dorsalgia, unspecified: Secondary | ICD-10-CM | POA: Diagnosis not present

## 2019-01-15 DIAGNOSIS — Z86010 Personal history of colonic polyps: Secondary | ICD-10-CM | POA: Diagnosis not present

## 2019-01-15 DIAGNOSIS — J942 Hemothorax: Secondary | ICD-10-CM | POA: Diagnosis not present

## 2019-01-15 DIAGNOSIS — S065X0A Traumatic subdural hemorrhage without loss of consciousness, initial encounter: Secondary | ICD-10-CM | POA: Diagnosis not present

## 2019-01-15 DIAGNOSIS — R4182 Altered mental status, unspecified: Secondary | ICD-10-CM | POA: Diagnosis present

## 2019-01-15 DIAGNOSIS — Z79899 Other long term (current) drug therapy: Secondary | ICD-10-CM | POA: Diagnosis not present

## 2019-01-15 DIAGNOSIS — I959 Hypotension, unspecified: Secondary | ICD-10-CM | POA: Diagnosis not present

## 2019-01-15 DIAGNOSIS — J9811 Atelectasis: Secondary | ICD-10-CM | POA: Diagnosis not present

## 2019-01-15 DIAGNOSIS — K5909 Other constipation: Secondary | ICD-10-CM | POA: Diagnosis not present

## 2019-01-15 DIAGNOSIS — S065X9D Traumatic subdural hemorrhage with loss of consciousness of unspecified duration, subsequent encounter: Secondary | ICD-10-CM | POA: Diagnosis not present

## 2019-01-15 DIAGNOSIS — Z7401 Bed confinement status: Secondary | ICD-10-CM | POA: Diagnosis not present

## 2019-01-15 DIAGNOSIS — Z48810 Encounter for surgical aftercare following surgery on the sense organs: Secondary | ICD-10-CM | POA: Diagnosis not present

## 2019-01-15 DIAGNOSIS — M810 Age-related osteoporosis without current pathological fracture: Secondary | ICD-10-CM | POA: Diagnosis not present

## 2019-01-15 DIAGNOSIS — Z720 Tobacco use: Secondary | ICD-10-CM | POA: Diagnosis not present

## 2019-01-15 DIAGNOSIS — G935 Compression of brain: Secondary | ICD-10-CM | POA: Diagnosis not present

## 2019-01-15 DIAGNOSIS — J90 Pleural effusion, not elsewhere classified: Secondary | ICD-10-CM | POA: Diagnosis not present

## 2019-01-15 DIAGNOSIS — R402353 Coma scale, best motor response, localizes pain, at hospital admission: Secondary | ICD-10-CM | POA: Diagnosis not present

## 2019-01-15 DIAGNOSIS — F329 Major depressive disorder, single episode, unspecified: Secondary | ICD-10-CM | POA: Diagnosis not present

## 2019-01-15 DIAGNOSIS — Z9181 History of falling: Secondary | ICD-10-CM | POA: Diagnosis not present

## 2019-01-15 DIAGNOSIS — S065X0D Traumatic subdural hemorrhage without loss of consciousness, subsequent encounter: Secondary | ICD-10-CM | POA: Diagnosis not present

## 2019-01-15 DIAGNOSIS — Z9049 Acquired absence of other specified parts of digestive tract: Secondary | ICD-10-CM | POA: Diagnosis not present

## 2019-01-15 DIAGNOSIS — R52 Pain, unspecified: Secondary | ICD-10-CM | POA: Diagnosis not present

## 2019-01-15 DIAGNOSIS — R1310 Dysphagia, unspecified: Secondary | ICD-10-CM | POA: Diagnosis not present

## 2019-01-15 DIAGNOSIS — I6201 Nontraumatic acute subdural hemorrhage: Secondary | ICD-10-CM | POA: Diagnosis not present

## 2019-01-15 DIAGNOSIS — F3289 Other specified depressive episodes: Secondary | ICD-10-CM | POA: Diagnosis not present

## 2019-01-15 DIAGNOSIS — Z8679 Personal history of other diseases of the circulatory system: Secondary | ICD-10-CM | POA: Diagnosis not present

## 2019-01-15 DIAGNOSIS — R41841 Cognitive communication deficit: Secondary | ICD-10-CM | POA: Diagnosis not present

## 2019-01-15 DIAGNOSIS — F17210 Nicotine dependence, cigarettes, uncomplicated: Secondary | ICD-10-CM | POA: Diagnosis not present

## 2019-01-15 DIAGNOSIS — R000 Tachycardia, unspecified: Secondary | ICD-10-CM | POA: Diagnosis not present

## 2019-01-15 DIAGNOSIS — Z4659 Encounter for fitting and adjustment of other gastrointestinal appliance and device: Secondary | ICD-10-CM | POA: Diagnosis not present

## 2019-01-15 DIAGNOSIS — I6203 Nontraumatic chronic subdural hemorrhage: Secondary | ICD-10-CM | POA: Diagnosis not present

## 2019-01-15 DIAGNOSIS — R498 Other voice and resonance disorders: Secondary | ICD-10-CM | POA: Diagnosis not present

## 2019-01-15 DIAGNOSIS — M150 Primary generalized (osteo)arthritis: Secondary | ICD-10-CM | POA: Diagnosis not present

## 2019-01-15 DIAGNOSIS — S2241XA Multiple fractures of ribs, right side, initial encounter for closed fracture: Secondary | ICD-10-CM | POA: Diagnosis not present

## 2019-01-15 DIAGNOSIS — J939 Pneumothorax, unspecified: Secondary | ICD-10-CM | POA: Diagnosis not present

## 2019-01-15 DIAGNOSIS — Z7983 Long term (current) use of bisphosphonates: Secondary | ICD-10-CM | POA: Diagnosis not present

## 2019-01-15 DIAGNOSIS — R404 Transient alteration of awareness: Secondary | ICD-10-CM | POA: Diagnosis not present

## 2019-01-15 LAB — RESPIRATORY PANEL BY RT PCR (FLU A&B, COVID)
Influenza A by PCR: NEGATIVE
Influenza B by PCR: NEGATIVE
SARS Coronavirus 2 by RT PCR: NEGATIVE

## 2019-01-15 LAB — COMPREHENSIVE METABOLIC PANEL
ALT: 10 U/L (ref 0–44)
AST: 22 U/L (ref 15–41)
Albumin: 3.1 g/dL — ABNORMAL LOW (ref 3.5–5.0)
Alkaline Phosphatase: 92 U/L (ref 38–126)
Anion gap: 13 (ref 5–15)
BUN: 19 mg/dL (ref 8–23)
CO2: 24 mmol/L (ref 22–32)
Calcium: 9.3 mg/dL (ref 8.9–10.3)
Chloride: 103 mmol/L (ref 98–111)
Creatinine, Ser: 0.96 mg/dL (ref 0.44–1.00)
GFR calc Af Amer: >60 mL/min (ref >60)
GFR calc non Af Amer: 55 mL/min — ABNORMAL LOW (ref >60)
Glucose, Bld: 111 mg/dL — ABNORMAL HIGH (ref 70–99)
Potassium: 4.4 mmol/L (ref 3.5–5.1)
Sodium: 140 mmol/L (ref 135–145)
Total Bilirubin: 1.1 mg/dL (ref 0.3–1.2)
Total Protein: 6.6 g/dL (ref 6.5–8.1)

## 2019-01-15 LAB — CBC WITH DIFFERENTIAL/PLATELET
Abs Immature Granulocytes: 0.25 10*3/uL — ABNORMAL HIGH (ref 0.00–0.07)
Basophils Absolute: 0.1 10*3/uL (ref 0.0–0.1)
Basophils Relative: 0 %
Eosinophils Absolute: 0.1 10*3/uL (ref 0.0–0.5)
Eosinophils Relative: 1 %
HCT: 40.5 % (ref 36.0–46.0)
Hemoglobin: 13.3 g/dL (ref 12.0–15.0)
Immature Granulocytes: 2 %
Lymphocytes Relative: 5 %
Lymphs Abs: 0.8 10*3/uL (ref 0.7–4.0)
MCH: 27.5 pg (ref 26.0–34.0)
MCHC: 32.8 g/dL (ref 30.0–36.0)
MCV: 83.7 fL (ref 80.0–100.0)
Monocytes Absolute: 1 10*3/uL (ref 0.1–1.0)
Monocytes Relative: 6 %
Neutro Abs: 14.3 10*3/uL — ABNORMAL HIGH (ref 1.7–7.7)
Neutrophils Relative %: 86 %
Platelets: 764 10*3/uL — ABNORMAL HIGH (ref 150–400)
RBC: 4.84 MIL/uL (ref 3.87–5.11)
RDW: 14.6 % (ref 11.5–15.5)
WBC: 16.5 10*3/uL — ABNORMAL HIGH (ref 4.0–10.5)
nRBC: 0 % (ref 0.0–0.2)

## 2019-01-15 LAB — TROPONIN I (HIGH SENSITIVITY): Troponin I (High Sensitivity): 47 ng/L — ABNORMAL HIGH (ref <18)

## 2019-01-15 MED ORDER — ACETAMINOPHEN 325 MG PO TABS
975.00 | ORAL_TABLET | ORAL | Status: DC
Start: 2019-01-13 — End: 2019-01-15

## 2019-01-15 MED ORDER — AMOXICILLIN-POT CLAVULANATE 875-125 MG PO TABS
875.00 | ORAL_TABLET | ORAL | Status: DC
Start: 2019-01-13 — End: 2019-01-15

## 2019-01-15 MED ORDER — HYDRALAZINE HCL 20 MG/ML IJ SOLN
10.00 | INTRAMUSCULAR | Status: DC
Start: ? — End: 2019-01-15

## 2019-01-15 MED ORDER — CALCIUM CARBONATE ANTACID 750 MG PO CHEW
CHEWABLE_TABLET | ORAL | Status: DC
Start: 2019-01-13 — End: 2019-01-15

## 2019-01-15 MED ORDER — DEXTROSE 50 % IV SOLN
12.50 | INTRAVENOUS | Status: DC
Start: ? — End: 2019-01-15

## 2019-01-15 MED ORDER — POTASSIUM CHLORIDE CRYS ER 20 MEQ PO TBCR
40.00 | EXTENDED_RELEASE_TABLET | ORAL | Status: DC
Start: 2019-01-14 — End: 2019-01-15

## 2019-01-15 MED ORDER — HEPARIN SODIUM (PORCINE) 5000 UNIT/ML IJ SOLN
5000.00 | INTRAMUSCULAR | Status: DC
Start: 2019-01-13 — End: 2019-01-15

## 2019-01-15 MED ORDER — ALBUTEROL SULFATE (5 MG/ML) 0.5% IN NEBU
2.50 | INHALATION_SOLUTION | RESPIRATORY_TRACT | Status: DC
Start: ? — End: 2019-01-15

## 2019-01-15 MED ORDER — FENTANYL CITRATE (PF) 50 MCG/ML IJ SOLN
25.00 | INTRAMUSCULAR | Status: DC
Start: ? — End: 2019-01-15

## 2019-01-15 MED ORDER — LABETALOL HCL 5 MG/ML IV SOLN
10.00 | INTRAVENOUS | Status: DC
Start: ? — End: 2019-01-15

## 2019-01-15 MED ORDER — SENNOSIDES 8.6 MG PO TABS
2.00 | ORAL_TABLET | ORAL | Status: DC
Start: 2019-01-13 — End: 2019-01-15

## 2019-01-15 MED ORDER — NALOXONE HCL 2 MG/2ML IJ SOSY
0.4000 mg | PREFILLED_SYRINGE | Freq: Once | INTRAMUSCULAR | Status: AC
  Administered 2019-01-15: 0.4 mg via INTRAVENOUS
  Filled 2019-01-15: qty 2

## 2019-01-15 MED ORDER — POLYETHYLENE GLYCOL 3350 17 GM/SCOOP PO POWD
17.00 | ORAL | Status: DC
Start: 2019-01-14 — End: 2019-01-15

## 2019-01-15 MED ORDER — GABAPENTIN 100 MG PO CAPS
100.00 | ORAL_CAPSULE | ORAL | Status: DC
Start: 2019-01-13 — End: 2019-01-15

## 2019-01-15 MED ORDER — HYDROMORPHONE HCL 1 MG/ML IJ SOLN
0.50 | INTRAMUSCULAR | Status: DC
Start: ? — End: 2019-01-15

## 2019-01-15 MED ORDER — HYDROCHLOROTHIAZIDE 25 MG PO TABS
12.50 | ORAL_TABLET | ORAL | Status: DC
Start: 2019-01-14 — End: 2019-01-15

## 2019-01-15 MED ORDER — LIDOCAINE 5 % EX PTCH
2.00 | MEDICATED_PATCH | CUTANEOUS | Status: DC
Start: 2019-01-14 — End: 2019-01-15

## 2019-01-15 MED ORDER — ONDANSETRON HCL 4 MG/2ML IJ SOLN
4.00 | INTRAMUSCULAR | Status: DC
Start: ? — End: 2019-01-15

## 2019-01-15 MED ORDER — DEXAMETHASONE SODIUM PHOSPHATE 10 MG/ML IJ SOLN
10.0000 mg | Freq: Once | INTRAMUSCULAR | Status: AC
  Administered 2019-01-15: 10 mg via INTRAVENOUS
  Filled 2019-01-15: qty 1

## 2019-01-15 MED ORDER — GLUCAGON (RDNA) 1 MG IJ KIT
1.00 | PACK | INTRAMUSCULAR | Status: DC
Start: ? — End: 2019-01-15

## 2019-01-15 MED ORDER — OXYCODONE HCL 5 MG PO TABS
5.00 | ORAL_TABLET | ORAL | Status: DC
Start: ? — End: 2019-01-15

## 2019-01-15 NOTE — ED Notes (Signed)
Pt grandson Larkin Ina [phone]
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
## 2019-01-15 NOTE — ED Notes (Signed)
EDP at bedside, pt has been accepted at [facility]

## 2019-01-15 NOTE — ED Notes (Signed)
emtala reviewed by this RN 

## 2019-01-15 NOTE — ED Notes (Signed)
X-ray at bedside

## 2019-01-16 DIAGNOSIS — Z8659 Personal history of other mental and behavioral disorders: Secondary | ICD-10-CM | POA: Insufficient documentation

## 2019-01-26 DIAGNOSIS — F3289 Other specified depressive episodes: Secondary | ICD-10-CM | POA: Diagnosis not present

## 2019-01-26 DIAGNOSIS — I6200 Nontraumatic subdural hemorrhage, unspecified: Secondary | ICD-10-CM | POA: Diagnosis not present

## 2019-01-26 DIAGNOSIS — Z9181 History of falling: Secondary | ICD-10-CM | POA: Diagnosis not present

## 2019-01-26 DIAGNOSIS — K5909 Other constipation: Secondary | ICD-10-CM | POA: Diagnosis not present

## 2019-01-26 DIAGNOSIS — M549 Dorsalgia, unspecified: Secondary | ICD-10-CM | POA: Diagnosis not present

## 2019-01-26 DIAGNOSIS — E568 Deficiency of other vitamins: Secondary | ICD-10-CM | POA: Diagnosis not present

## 2019-01-26 DIAGNOSIS — N183 Chronic kidney disease, stage 3 unspecified: Secondary | ICD-10-CM | POA: Diagnosis not present

## 2019-01-26 DIAGNOSIS — R498 Other voice and resonance disorders: Secondary | ICD-10-CM | POA: Diagnosis not present

## 2019-01-26 DIAGNOSIS — R52 Pain, unspecified: Secondary | ICD-10-CM | POA: Diagnosis not present

## 2019-01-26 DIAGNOSIS — K219 Gastro-esophageal reflux disease without esophagitis: Secondary | ICD-10-CM | POA: Diagnosis not present

## 2019-01-26 DIAGNOSIS — K5900 Constipation, unspecified: Secondary | ICD-10-CM | POA: Diagnosis not present

## 2019-01-26 DIAGNOSIS — G4700 Insomnia, unspecified: Secondary | ICD-10-CM | POA: Diagnosis not present

## 2019-01-26 DIAGNOSIS — F329 Major depressive disorder, single episode, unspecified: Secondary | ICD-10-CM | POA: Diagnosis not present

## 2019-01-26 DIAGNOSIS — D649 Anemia, unspecified: Secondary | ICD-10-CM | POA: Diagnosis not present

## 2019-01-26 DIAGNOSIS — I10 Essential (primary) hypertension: Secondary | ICD-10-CM | POA: Diagnosis not present

## 2019-01-26 DIAGNOSIS — R2681 Unsteadiness on feet: Secondary | ICD-10-CM | POA: Diagnosis not present

## 2019-01-26 DIAGNOSIS — Z48810 Encounter for surgical aftercare following surgery on the sense organs: Secondary | ICD-10-CM | POA: Diagnosis not present

## 2019-01-26 DIAGNOSIS — R488 Other symbolic dysfunctions: Secondary | ICD-10-CM | POA: Diagnosis not present

## 2019-01-26 DIAGNOSIS — S065X9D Traumatic subdural hemorrhage with loss of consciousness of unspecified duration, subsequent encounter: Secondary | ICD-10-CM | POA: Diagnosis not present

## 2019-01-26 DIAGNOSIS — M150 Primary generalized (osteo)arthritis: Secondary | ICD-10-CM | POA: Diagnosis not present

## 2019-01-26 DIAGNOSIS — M6281 Muscle weakness (generalized): Secondary | ICD-10-CM | POA: Diagnosis not present

## 2019-01-27 DIAGNOSIS — I10 Essential (primary) hypertension: Secondary | ICD-10-CM | POA: Diagnosis not present

## 2019-01-27 DIAGNOSIS — I6200 Nontraumatic subdural hemorrhage, unspecified: Secondary | ICD-10-CM | POA: Diagnosis not present

## 2019-01-28 LAB — BLOOD GAS, VENOUS
Acid-Base Excess: 5.6 mmol/L — ABNORMAL HIGH (ref 0.0–2.0)
Bicarbonate: 29.8 mmol/L — ABNORMAL HIGH (ref 20.0–28.0)
O2 Saturation: 64.8 %
Patient temperature: 37
pCO2, Ven: 41 mmHg — ABNORMAL LOW (ref 44.0–60.0)
pH, Ven: 7.47 — ABNORMAL HIGH (ref 7.250–7.430)

## 2019-02-03 DIAGNOSIS — K219 Gastro-esophageal reflux disease without esophagitis: Secondary | ICD-10-CM | POA: Diagnosis not present

## 2019-02-03 DIAGNOSIS — K5900 Constipation, unspecified: Secondary | ICD-10-CM | POA: Diagnosis not present

## 2019-02-03 DIAGNOSIS — F329 Major depressive disorder, single episode, unspecified: Secondary | ICD-10-CM | POA: Diagnosis not present

## 2019-02-03 DIAGNOSIS — I6200 Nontraumatic subdural hemorrhage, unspecified: Secondary | ICD-10-CM | POA: Diagnosis not present

## 2019-02-03 DIAGNOSIS — I10 Essential (primary) hypertension: Secondary | ICD-10-CM | POA: Diagnosis not present

## 2019-02-03 DIAGNOSIS — G4700 Insomnia, unspecified: Secondary | ICD-10-CM | POA: Diagnosis not present

## 2019-02-21 DIAGNOSIS — S2241XS Multiple fractures of ribs, right side, sequela: Secondary | ICD-10-CM | POA: Diagnosis not present

## 2019-02-21 DIAGNOSIS — S065X9A Traumatic subdural hemorrhage with loss of consciousness of unspecified duration, initial encounter: Secondary | ICD-10-CM | POA: Diagnosis not present

## 2019-02-21 DIAGNOSIS — S065X9D Traumatic subdural hemorrhage with loss of consciousness of unspecified duration, subsequent encounter: Secondary | ICD-10-CM | POA: Diagnosis not present

## 2019-02-22 DIAGNOSIS — G4700 Insomnia, unspecified: Secondary | ICD-10-CM | POA: Diagnosis not present

## 2019-02-22 DIAGNOSIS — I10 Essential (primary) hypertension: Secondary | ICD-10-CM | POA: Diagnosis not present

## 2019-02-22 DIAGNOSIS — N189 Chronic kidney disease, unspecified: Secondary | ICD-10-CM | POA: Diagnosis not present

## 2019-02-22 DIAGNOSIS — Z9889 Other specified postprocedural states: Secondary | ICD-10-CM | POA: Diagnosis not present

## 2019-02-22 DIAGNOSIS — F329 Major depressive disorder, single episode, unspecified: Secondary | ICD-10-CM | POA: Diagnosis not present

## 2019-02-22 DIAGNOSIS — G8929 Other chronic pain: Secondary | ICD-10-CM | POA: Diagnosis not present

## 2019-02-22 DIAGNOSIS — K219 Gastro-esophageal reflux disease without esophagitis: Secondary | ICD-10-CM | POA: Diagnosis not present

## 2019-02-22 DIAGNOSIS — S065X9A Traumatic subdural hemorrhage with loss of consciousness of unspecified duration, initial encounter: Secondary | ICD-10-CM | POA: Diagnosis not present

## 2019-02-22 DIAGNOSIS — M810 Age-related osteoporosis without current pathological fracture: Secondary | ICD-10-CM | POA: Diagnosis not present

## 2019-03-10 DIAGNOSIS — Z79899 Other long term (current) drug therapy: Secondary | ICD-10-CM | POA: Diagnosis not present

## 2019-03-10 DIAGNOSIS — I10 Essential (primary) hypertension: Secondary | ICD-10-CM | POA: Diagnosis not present

## 2019-03-10 DIAGNOSIS — N1831 Chronic kidney disease, stage 3a: Secondary | ICD-10-CM | POA: Diagnosis not present

## 2019-03-10 DIAGNOSIS — S065X9A Traumatic subdural hemorrhage with loss of consciousness of unspecified duration, initial encounter: Secondary | ICD-10-CM | POA: Diagnosis not present

## 2019-03-10 DIAGNOSIS — F5101 Primary insomnia: Secondary | ICD-10-CM | POA: Diagnosis not present

## 2019-03-14 DIAGNOSIS — S2241XD Multiple fractures of ribs, right side, subsequent encounter for fracture with routine healing: Secondary | ICD-10-CM | POA: Diagnosis not present

## 2019-03-14 DIAGNOSIS — Z87891 Personal history of nicotine dependence: Secondary | ICD-10-CM | POA: Diagnosis not present

## 2019-04-18 DIAGNOSIS — S065X9A Traumatic subdural hemorrhage with loss of consciousness of unspecified duration, initial encounter: Secondary | ICD-10-CM | POA: Diagnosis not present

## 2019-04-18 DIAGNOSIS — R519 Headache, unspecified: Secondary | ICD-10-CM | POA: Diagnosis not present

## 2019-04-18 DIAGNOSIS — Z9889 Other specified postprocedural states: Secondary | ICD-10-CM | POA: Diagnosis not present

## 2019-04-18 DIAGNOSIS — H538 Other visual disturbances: Secondary | ICD-10-CM | POA: Diagnosis not present

## 2019-04-18 DIAGNOSIS — R202 Paresthesia of skin: Secondary | ICD-10-CM | POA: Diagnosis not present

## 2019-04-27 DIAGNOSIS — H43822 Vitreomacular adhesion, left eye: Secondary | ICD-10-CM | POA: Diagnosis not present

## 2019-05-26 DIAGNOSIS — M810 Age-related osteoporosis without current pathological fracture: Secondary | ICD-10-CM | POA: Diagnosis not present

## 2019-05-31 DIAGNOSIS — I10 Essential (primary) hypertension: Secondary | ICD-10-CM | POA: Diagnosis not present

## 2019-05-31 DIAGNOSIS — N1831 Chronic kidney disease, stage 3a: Secondary | ICD-10-CM | POA: Diagnosis not present

## 2019-06-07 DIAGNOSIS — I10 Essential (primary) hypertension: Secondary | ICD-10-CM | POA: Diagnosis not present

## 2019-06-07 DIAGNOSIS — F5101 Primary insomnia: Secondary | ICD-10-CM | POA: Diagnosis not present

## 2019-06-07 DIAGNOSIS — Z79899 Other long term (current) drug therapy: Secondary | ICD-10-CM | POA: Diagnosis not present

## 2019-06-07 DIAGNOSIS — K219 Gastro-esophageal reflux disease without esophagitis: Secondary | ICD-10-CM | POA: Diagnosis not present

## 2019-06-07 DIAGNOSIS — N1831 Chronic kidney disease, stage 3a: Secondary | ICD-10-CM | POA: Diagnosis not present

## 2019-06-27 DIAGNOSIS — G5602 Carpal tunnel syndrome, left upper limb: Secondary | ICD-10-CM | POA: Diagnosis not present

## 2019-06-27 DIAGNOSIS — I872 Venous insufficiency (chronic) (peripheral): Secondary | ICD-10-CM | POA: Diagnosis not present

## 2019-06-27 DIAGNOSIS — N1831 Chronic kidney disease, stage 3a: Secondary | ICD-10-CM | POA: Diagnosis not present

## 2019-06-27 DIAGNOSIS — M810 Age-related osteoporosis without current pathological fracture: Secondary | ICD-10-CM | POA: Diagnosis not present

## 2019-06-27 DIAGNOSIS — I10 Essential (primary) hypertension: Secondary | ICD-10-CM | POA: Diagnosis not present

## 2019-06-27 DIAGNOSIS — Z79899 Other long term (current) drug therapy: Secondary | ICD-10-CM | POA: Diagnosis not present

## 2019-07-04 DIAGNOSIS — Z0000 Encounter for general adult medical examination without abnormal findings: Secondary | ICD-10-CM | POA: Diagnosis not present

## 2019-07-04 DIAGNOSIS — Z1331 Encounter for screening for depression: Secondary | ICD-10-CM | POA: Diagnosis not present

## 2019-08-05 ENCOUNTER — Other Ambulatory Visit: Payer: Self-pay | Admitting: Family Medicine

## 2019-08-05 DIAGNOSIS — Z1231 Encounter for screening mammogram for malignant neoplasm of breast: Secondary | ICD-10-CM

## 2019-08-26 ENCOUNTER — Other Ambulatory Visit: Payer: Self-pay

## 2019-08-26 ENCOUNTER — Ambulatory Visit
Admission: RE | Admit: 2019-08-26 | Discharge: 2019-08-26 | Disposition: A | Discharge status: Home / Self Care | Payer: PPO | Source: Ambulatory Visit | Attending: Family Medicine | Admitting: Family Medicine

## 2019-08-26 DIAGNOSIS — Z1231 Encounter for screening mammogram for malignant neoplasm of breast: Secondary | ICD-10-CM | POA: Insufficient documentation

## 2019-10-13 DIAGNOSIS — Z9189 Other specified personal risk factors, not elsewhere classified: Secondary | ICD-10-CM | POA: Diagnosis not present

## 2019-10-13 DIAGNOSIS — Z23 Encounter for immunization: Secondary | ICD-10-CM | POA: Diagnosis not present

## 2019-10-13 DIAGNOSIS — L299 Pruritus, unspecified: Secondary | ICD-10-CM | POA: Diagnosis not present

## 2019-11-16 ENCOUNTER — Encounter: Payer: PPO | Admitting: Dermatology

## 2019-12-28 DIAGNOSIS — Z79899 Other long term (current) drug therapy: Secondary | ICD-10-CM | POA: Diagnosis not present

## 2019-12-28 DIAGNOSIS — N1831 Chronic kidney disease, stage 3a: Secondary | ICD-10-CM | POA: Diagnosis not present

## 2020-01-03 DIAGNOSIS — F3341 Major depressive disorder, recurrent, in partial remission: Secondary | ICD-10-CM | POA: Diagnosis not present

## 2020-01-03 DIAGNOSIS — D649 Anemia, unspecified: Secondary | ICD-10-CM | POA: Diagnosis not present

## 2020-01-03 DIAGNOSIS — I10 Essential (primary) hypertension: Secondary | ICD-10-CM | POA: Diagnosis not present

## 2020-01-03 DIAGNOSIS — Z79899 Other long term (current) drug therapy: Secondary | ICD-10-CM | POA: Diagnosis not present

## 2020-01-03 DIAGNOSIS — N1831 Chronic kidney disease, stage 3a: Secondary | ICD-10-CM | POA: Diagnosis not present

## 2020-01-03 DIAGNOSIS — F5101 Primary insomnia: Secondary | ICD-10-CM | POA: Diagnosis not present

## 2020-01-11 DIAGNOSIS — H6123 Impacted cerumen, bilateral: Secondary | ICD-10-CM | POA: Diagnosis not present

## 2020-01-11 DIAGNOSIS — H903 Sensorineural hearing loss, bilateral: Secondary | ICD-10-CM | POA: Diagnosis not present

## 2020-03-29 ENCOUNTER — Encounter: Payer: PPO | Admitting: Dermatology

## 2020-04-24 DIAGNOSIS — H43822 Vitreomacular adhesion, left eye: Secondary | ICD-10-CM | POA: Diagnosis not present

## 2020-05-08 DIAGNOSIS — H26491 Other secondary cataract, right eye: Secondary | ICD-10-CM | POA: Diagnosis not present

## 2020-06-21 ENCOUNTER — Other Ambulatory Visit: Payer: Self-pay

## 2020-06-21 ENCOUNTER — Encounter: Payer: Self-pay | Admitting: Dermatology

## 2020-06-21 ENCOUNTER — Ambulatory Visit: Payer: PPO | Admitting: Dermatology

## 2020-06-21 DIAGNOSIS — D485 Neoplasm of uncertain behavior of skin: Secondary | ICD-10-CM

## 2020-06-21 DIAGNOSIS — C44619 Basal cell carcinoma of skin of left upper limb, including shoulder: Secondary | ICD-10-CM | POA: Diagnosis not present

## 2020-06-21 DIAGNOSIS — D229 Melanocytic nevi, unspecified: Secondary | ICD-10-CM | POA: Diagnosis not present

## 2020-06-21 DIAGNOSIS — L821 Other seborrheic keratosis: Secondary | ICD-10-CM

## 2020-06-21 DIAGNOSIS — L298 Other pruritus: Secondary | ICD-10-CM | POA: Diagnosis not present

## 2020-06-21 DIAGNOSIS — D0439 Carcinoma in situ of skin of other parts of face: Secondary | ICD-10-CM

## 2020-06-21 DIAGNOSIS — L820 Inflamed seborrheic keratosis: Secondary | ICD-10-CM

## 2020-06-21 DIAGNOSIS — Z1283 Encounter for screening for malignant neoplasm of skin: Secondary | ICD-10-CM

## 2020-06-21 DIAGNOSIS — D1800 Hemangioma unspecified site: Secondary | ICD-10-CM | POA: Diagnosis not present

## 2020-06-21 DIAGNOSIS — L814 Other melanin hyperpigmentation: Secondary | ICD-10-CM | POA: Diagnosis not present

## 2020-06-21 DIAGNOSIS — L720 Epidermal cyst: Secondary | ICD-10-CM

## 2020-06-21 DIAGNOSIS — D099 Carcinoma in situ, unspecified: Secondary | ICD-10-CM

## 2020-06-21 DIAGNOSIS — C4491 Basal cell carcinoma of skin, unspecified: Secondary | ICD-10-CM

## 2020-06-21 DIAGNOSIS — L570 Actinic keratosis: Secondary | ICD-10-CM

## 2020-06-21 DIAGNOSIS — L578 Other skin changes due to chronic exposure to nonionizing radiation: Secondary | ICD-10-CM

## 2020-06-21 DIAGNOSIS — D044 Carcinoma in situ of skin of scalp and neck: Secondary | ICD-10-CM

## 2020-06-21 HISTORY — DX: Carcinoma in situ, unspecified: D09.9

## 2020-06-21 HISTORY — DX: Basal cell carcinoma of skin, unspecified: C44.91

## 2020-06-21 MED ORDER — MUPIROCIN 2 % EX OINT: 1.0000 "application " | TOPICAL_OINTMENT | Freq: Every day | CUTANEOUS | 0 refills | Status: DC

## 2020-06-21 NOTE — Progress Notes (Signed)
Follow-Up Visit   Subjective  Tina Ingram is a 86 y.o. female who presents for the following: FBSE (Patient here for full body skin exam and skin cancer screening. Patient does have a spot at the top of scalp that does itch and one behind the left ear. Patient also complaining of itch all over. ).  Patient accompanied by sister.   The following portions of the chart were reviewed this encounter and updated as appropriate:   Tobacco  Allergies  Meds  Problems  Med Hx  Surg Hx  Fam Hx       Review of Systems:  No other skin or systemic complaints except as noted in HPI or Assessment and Plan.  Objective  Well appearing patient in no apparent distress; mood and affect are within normal limits.  A full examination was performed including scalp, head, eyes, ears, nose, lips, neck, chest, axillae, abdomen, back, buttocks, bilateral upper extremities, bilateral lower extremities, hands, feet, fingers, toes, fingernails, and toenails. All findings within normal limits unless otherwise noted below.  Left Vertex Scalp 2.5cm eroded pink plaque        Nasal Tip 1.1cm scaly pink plaque     Left Posterior Shoulder 0.3cm tan papule R/o BCC     Left Postauricular Subcutaneous nodule.   Right Temple Erythematous keratotic or waxy stuck-on papule or plaque.   Left Cheek x 1, mid chest x 1 Erythematous thin papules/macules with gritty scale.    Assessment & Plan  Neoplasm of uncertain behavior of skin (3) Left Vertex Scalp  Skin / nail biopsy Type of biopsy: tangential   Informed consent: discussed and consent obtained   Timeout: patient name, date of birth, surgical site, and procedure verified   Patient was prepped and draped in usual sterile fashion: Area prepped with isopropyl alcohol. Anesthesia: the lesion was anesthetized in a standard fashion   Anesthetic:  1% lidocaine w/ epinephrine 1-100,000 buffered w/ 8.4% NaHCO3 Instrument used: flexible razor  blade   Hemostasis achieved with: aluminum chloride   Outcome: patient tolerated procedure well   Post-procedure details: wound care instructions given   Additional details:  Mupirocin and a bandage applied  Specimen 1 - Surgical pathology Differential Diagnosis: R/o SCC vs BCC vs Scar  Check Margins: No 2.5cm eroded pink plaque  Nasal Tip  Skin / nail biopsy Type of biopsy: tangential   Informed consent: discussed and consent obtained   Timeout: patient name, date of birth, surgical site, and procedure verified   Patient was prepped and draped in usual sterile fashion: Area prepped with isopropyl alcohol. Anesthesia: the lesion was anesthetized in a standard fashion   Anesthetic:  1% lidocaine w/ epinephrine 1-100,000 buffered w/ 8.4% NaHCO3 Instrument used: flexible razor blade   Hemostasis achieved with: aluminum chloride   Outcome: patient tolerated procedure well   Post-procedure details: wound care instructions given   Additional details:  Mupirocin and a bandage applied  Specimen 2 - Surgical pathology Differential Diagnosis: r/o SCCis vs BCC  Check Margins: No 1.1cm scaly pink plaque  Left Posterior Shoulder  Skin / nail biopsy Type of biopsy: tangential   Informed consent: discussed and consent obtained   Timeout: patient name, date of birth, surgical site, and procedure verified   Patient was prepped and draped in usual sterile fashion: Area prepped with isopropyl alcohol. Anesthesia: the lesion was anesthetized in a standard fashion   Anesthetic:  1% lidocaine w/ epinephrine 1-100,000 buffered w/ 8.4% NaHCO3 Instrument used: flexible razor blade  Hemostasis achieved with: aluminum chloride   Outcome: patient tolerated procedure well   Post-procedure details: wound care instructions given   Additional details:  Mupirocin and a bandage applied  Specimen 3 - Surgical pathology Differential Diagnosis: R/o BCC  Check Margins: No 0.3cm tan papule   Related  Medications mupirocin ointment (BACTROBAN) 2 % Apply 1 application topically daily.  Epidermal inclusion cyst Left Postauricular  Benign-appearing.  Observation.  Call clinic for new or changing lesions.  Recommend daily use of broad spectrum spf 30+ sunscreen to sun-exposed areas.    Inflamed seborrheic keratosis Right Temple  Prior to procedure, discussed risks of blister formation, small wound, skin dyspigmentation, or rare scar following cryotherapy.    Destruction of lesion - Right Temple  Destruction method: cryotherapy   Informed consent: discussed and consent obtained   Lesion destroyed using liquid nitrogen: Yes   Cryotherapy cycles:  2 Outcome: patient tolerated procedure well with no complications   Post-procedure details: wound care instructions given    AK (actinic keratosis) Left Cheek x 1, mid chest x 1  Prior to procedure, discussed risks of blister formation, small wound, skin dyspigmentation, or rare scar following cryotherapy.    Destruction of lesion - Left Cheek x 1, mid chest x 1  Destruction method: cryotherapy   Informed consent: discussed and consent obtained   Lesion destroyed using liquid nitrogen: Yes   Cryotherapy cycles:  2 Outcome: patient tolerated procedure well with no complications   Post-procedure details: wound care instructions given    Lentigines - Scattered tan macules - Due to sun exposure - Benign-appering, observe - Recommend daily broad spectrum sunscreen SPF 30+ to sun-exposed areas, reapply every 2 hours as needed. - Call for any changes  Seborrheic Keratoses - Stuck-on, waxy, tan-brown papules and/or plaques  - Benign-appearing - Discussed benign etiology and prognosis. - Observe - Call for any changes  Melanocytic Nevi - Tan-brown and/or pink-flesh-colored symmetric macules and papules - Benign appearing on exam today - Observation - Call clinic for new or changing moles - Recommend daily use of broad spectrum  spf 30+ sunscreen to sun-exposed areas.   Hemangiomas - Red papules - Discussed benign nature - Observe - Call for any changes  Actinic Damage - Chronic condition, secondary to cumulative UV/sun exposure - diffuse scaly erythematous macules with underlying dyspigmentation - Recommend daily broad spectrum sunscreen SPF 30+ to sun-exposed areas, reapply every 2 hours as needed.  - Staying in the shade or wearing long sleeves, sun glasses (UVA+UVB protection) and wide brim hats (4-inch brim around the entire circumference of the hat) are also recommended for sun protection.  - Call for new or changing lesions.  Chronic migratory itch - Start gentle skin care (handout provided) - Recommend OTC [place] Rapid Relief Anti-Itch cream (pramoxine + menthol) up to 3 times per day to areas that are itchy.  Skin cancer screening performed today.  Return in about 3 months (around 09/21/2020) for AK, 1 year TBSE.  Graciella Belton, RMA, am acting as scribe for Forest Gleason, MD .  Documentation: I have reviewed the above documentation for accuracy and completeness, and I agree with the above.  Forest Gleason, MD

## 2020-06-21 NOTE — Patient Instructions (Addendum)
Cryotherapy Aftercare  Wash gently with soap and water everyday.   Apply Vaseline and Band-Aid daily until healed.   Prior to procedure, discussed risks of blister formation, small wound, skin dyspigmentation, or rare scar following cryotherapy.    Wound Care Instructions  Cleanse wound gently with soap and water once a day then pat dry with clean gauze. Apply a thing coat of Petrolatum (petroleum jelly, "Vaseline") over the wound (unless you have an allergy to this) or prescription antibiotic ointment if your doctor prescribed this. We recommend that you use a new, sterile tube of Vaseline. Do not pick or remove scabs. Do not remove the yellow or white "healing tissue" from the base of the wound.  Cover the wound with fresh, clean, nonstick gauze and secure with paper tape. You may use Band-Aids in place of gauze and tape if the would is small enough, but would recommend trimming much of the tape off as there is often too much. Sometimes Band-Aids can irritate the skin.  You should call the office for your biopsy report after 1 week if you have not already been contacted.  If you experience any problems, such as abnormal amounts of bleeding, swelling, significant bruising, significant pain, or evidence of infection, please call the office immediately.  FOR ADULT SURGERY PATIENTS: If you need something for pain relief you may take 1 extra strength Tylenol (acetaminophen) AND 2 Ibuprofen (200mg  each) together every 4 hours as needed for pain. (do not take these if you are allergic to them or if you have a reason you should not take them.) Typically, you may only need pain medication for 1 to 3 days.   Gentle Skin Care Guide  1. Bathe no more than once a day.  2. Avoid bathing in hot water  3. Use a mild soap like Dove, Vanicream, Cetaphil, CeraVe. Can use Lever 2000 or Cetaphil antibacterial soap  4. Use soap only where you need it. On most days, use it under your arms, between your legs,  and on your feet. Let the water rinse other areas unless visibly dirty.  5. When you get out of the bath/shower, use a towel to gently blot your skin dry, don't rub it.  6. While your skin is still a little damp, apply a moisturizing cream such as Vanicream, CeraVe, Cetaphil, Eucerin, Sarna lotion or plain Vaseline Jelly. For hands apply Neutrogena [place] Hand Cream or Excipial Hand Cream.  7. Reapply moisturizer any time you start to itch or feel dry.  8. Sometimes using free and clear laundry detergents can be helpful. Fabric softener sheets should be avoided. Downy Free & Gentle liquid, or any liquid fabric softener that is free of dyes and perfumes, it acceptable to use  9. If your doctor has given you prescription creams you may apply moisturizers over them   Recommend OTC Gold Bond Rapid Relief Anti-Itch cream (pramoxine + menthol) up to 3 times per day to areas that are itchy.  If you have any questions or concerns for your doctor, please call our main line at [phone] and press option 4 to reach your doctor's medical assistant. If no one answers, please leave a voicemail as directed and we will return your call as soon as possible. Messages left after 4 pm will be answered the following business day.   You may also send [place] a message via [place]. We typically respond to MyChart messages within 1-2 business days.  For prescription refills, please ask your pharmacy to contact  our office. Our fax number is [phone].  If you have an urgent issue when the clinic is closed that cannot wait until the next business day, you can page your doctor at the number below.    Please note that while we do our best to be available for urgent issues outside of office hours, we are not available 24/7.   If you have an urgent issue and are unable to reach [place], you may choose to seek medical care at your doctor's office, retail clinic, urgent care center, or emergency room.  If you have a  medical emergency, please immediately call 911 or go to the emergency department.  Pager Numbers  - Dr. Nehemiah Massed: [phone]  - Dr. Laurence Ferrari: [phone]  - Dr. Nicole Kindred: [phone]  In the event of inclement weather, please call our main line at [phone] for an update on the status of any delays or closures.  Dermatology Medication Tips: Please keep the boxes that topical medications come in in order to help keep track of the instructions about where and how to use these. Pharmacies typically print the medication instructions only on the boxes and not directly on the medication tubes.   If your medication is too expensive, please contact our office at [phone] option 4 or send [place] a message through [place].   We are unable to tell what your co-pay for medications will be in advance as this is different depending on your insurance coverage. However, we may be able to find a substitute medication at lower cost or fill out paperwork to get insurance to cover a needed medication.   If a prior authorization is required to get your medication covered by your insurance company, please allow [place] 1-2 business days to complete this process.  Drug prices often vary depending on where the prescription is filled and some pharmacies may offer cheaper prices.  The website www.goodrx.com contains coupons for medications through different pharmacies. The prices here do not account for what the cost may be with help from insurance (it may be cheaper with your insurance), but the website can give you the price if you did not use any insurance.  - You can print the associated coupon and take it with your prescription to the pharmacy.  - You may also stop by our office during regular business hours and pick up a GoodRx coupon card.  - If you need your prescription sent electronically to a different pharmacy, notify our office through [facility] or by phone at [phone] option 4.

## 2020-07-04 ENCOUNTER — Telehealth: Payer: Self-pay

## 2020-07-04 NOTE — Telephone Encounter (Signed)
-----   Message from Alfonso Patten, MD sent at 07/03/2020  4:09 PM EDT ----- Diagnosis 1. Skin , left vertex scalp SQUAMOUS CELL CARCINOMA IN SITU, PERIPHERAL MARGIN INVOLVED --> Mohs surgery  2. Skin , nasal tip SQUAMOUS CELL CARCINOMA IN SITU, BASE INVOLVED --> Mohs surgery  3. Skin , left posterior shoulder BASAL CELL CARCINOMA, NODULAR PATTERN, BASE INVOLVED --> ED&C  No answer, left voicemail 07/03/2020.  MAs please give patient results if she calls back. I will try again tomorrow. Thank you!

## 2020-07-04 NOTE — Telephone Encounter (Signed)
Patient advised bx results BCC at shoulder, scheduled for EDC and scalp and nasal tip showed SCCis. Patient's sister will call me tomorrow to let me know preference on where to have Moh's, JS

## 2020-07-05 ENCOUNTER — Other Ambulatory Visit: Payer: Self-pay

## 2020-07-05 ENCOUNTER — Inpatient Hospital Stay
Admission: EM | Admit: 2020-07-05 | Discharge: 2020-07-12 | DRG: 378 | Disposition: A | Discharge status: Home Health | Payer: PPO | Attending: Internal Medicine | Admitting: Internal Medicine

## 2020-07-05 ENCOUNTER — Emergency Department: Payer: PPO

## 2020-07-05 DIAGNOSIS — I517 Cardiomegaly: Secondary | ICD-10-CM | POA: Diagnosis not present

## 2020-07-05 DIAGNOSIS — Z7982 Long term (current) use of aspirin: Secondary | ICD-10-CM | POA: Diagnosis not present

## 2020-07-05 DIAGNOSIS — D62 Acute posthemorrhagic anemia: Secondary | ICD-10-CM | POA: Diagnosis present

## 2020-07-05 DIAGNOSIS — K922 Gastrointestinal hemorrhage, unspecified: Secondary | ICD-10-CM | POA: Diagnosis present

## 2020-07-05 DIAGNOSIS — I10 Essential (primary) hypertension: Secondary | ICD-10-CM | POA: Diagnosis not present

## 2020-07-05 DIAGNOSIS — G8929 Other chronic pain: Secondary | ICD-10-CM | POA: Diagnosis not present

## 2020-07-05 DIAGNOSIS — R1310 Dysphagia, unspecified: Secondary | ICD-10-CM | POA: Diagnosis not present

## 2020-07-05 DIAGNOSIS — M545 Low back pain, unspecified: Secondary | ICD-10-CM | POA: Diagnosis not present

## 2020-07-05 DIAGNOSIS — F17210 Nicotine dependence, cigarettes, uncomplicated: Secondary | ICD-10-CM | POA: Diagnosis present

## 2020-07-05 DIAGNOSIS — Z79899 Other long term (current) drug therapy: Secondary | ICD-10-CM

## 2020-07-05 DIAGNOSIS — E876 Hypokalemia: Secondary | ICD-10-CM

## 2020-07-05 DIAGNOSIS — Z85828 Personal history of other malignant neoplasm of skin: Secondary | ICD-10-CM | POA: Diagnosis not present

## 2020-07-05 DIAGNOSIS — N1831 Chronic kidney disease, stage 3a: Secondary | ICD-10-CM | POA: Diagnosis not present

## 2020-07-05 DIAGNOSIS — D500 Iron deficiency anemia secondary to blood loss (chronic): Secondary | ICD-10-CM | POA: Diagnosis not present

## 2020-07-05 DIAGNOSIS — K640 First degree hemorrhoids: Secondary | ICD-10-CM | POA: Diagnosis present

## 2020-07-05 DIAGNOSIS — Z7983 Long term (current) use of bisphosphonates: Secondary | ICD-10-CM

## 2020-07-05 DIAGNOSIS — M810 Age-related osteoporosis without current pathological fracture: Secondary | ICD-10-CM | POA: Diagnosis present

## 2020-07-05 DIAGNOSIS — R0602 Shortness of breath: Secondary | ICD-10-CM | POA: Diagnosis not present

## 2020-07-05 DIAGNOSIS — D649 Anemia, unspecified: Secondary | ICD-10-CM

## 2020-07-05 DIAGNOSIS — K921 Melena: Principal | ICD-10-CM | POA: Diagnosis present

## 2020-07-05 DIAGNOSIS — R531 Weakness: Secondary | ICD-10-CM | POA: Diagnosis not present

## 2020-07-05 DIAGNOSIS — N179 Acute kidney failure, unspecified: Secondary | ICD-10-CM

## 2020-07-05 DIAGNOSIS — S2231XA Fracture of one rib, right side, initial encounter for closed fracture: Secondary | ICD-10-CM | POA: Diagnosis not present

## 2020-07-05 DIAGNOSIS — K649 Unspecified hemorrhoids: Secondary | ICD-10-CM | POA: Diagnosis not present

## 2020-07-05 DIAGNOSIS — Z20822 Contact with and (suspected) exposure to covid-19: Secondary | ICD-10-CM | POA: Diagnosis not present

## 2020-07-05 DIAGNOSIS — F17200 Nicotine dependence, unspecified, uncomplicated: Secondary | ICD-10-CM | POA: Diagnosis not present

## 2020-07-05 LAB — COMPREHENSIVE METABOLIC PANEL
ALT: 6 U/L (ref 0–44)
AST: 15 U/L (ref 15–41)
Albumin: 3.4 g/dL — ABNORMAL LOW (ref 3.5–5.0)
Alkaline Phosphatase: 61 U/L (ref 38–126)
Anion gap: 8 (ref 5–15)
BUN: 26 mg/dL — ABNORMAL HIGH (ref 8–23)
CO2: 28 mmol/L (ref 22–32)
Calcium: 9.5 mg/dL (ref 8.9–10.3)
Chloride: 102 mmol/L (ref 98–111)
Creatinine, Ser: 1.58 mg/dL — ABNORMAL HIGH (ref 0.44–1.00)
GFR, Estimated: 32 mL/min — ABNORMAL LOW (ref >60)
Glucose, Bld: 115 mg/dL — ABNORMAL HIGH (ref 70–99)
Potassium: 3.2 mmol/L — ABNORMAL LOW (ref 3.5–5.1)
Sodium: 138 mmol/L (ref 135–145)
Total Bilirubin: 1.1 mg/dL (ref 0.3–1.2)
Total Protein: 6.2 g/dL — ABNORMAL LOW (ref 6.5–8.1)

## 2020-07-05 LAB — RETICULOCYTES
Immature Retic Fract: 12.9 % (ref 2.3–15.9)
RBC.: 3.5 MIL/uL — ABNORMAL LOW (ref 3.87–5.11)
Retic Count, Absolute: 44.8 10*3/uL (ref 19.0–186.0)
Retic Ct Pct: 1.3 % (ref 0.4–3.1)

## 2020-07-05 LAB — PROTIME-INR
INR: 1.1 (ref 0.8–1.2)
Prothrombin Time: 14 seconds (ref 11.4–15.2)

## 2020-07-05 LAB — CBC
HCT: 20.5 % — ABNORMAL LOW (ref 36.0–46.0)
Hemoglobin: 5.4 g/dL — ABNORMAL LOW (ref 12.0–15.0)
MCH: 16.1 pg — ABNORMAL LOW (ref 26.0–34.0)
MCHC: 26.3 g/dL — ABNORMAL LOW (ref 30.0–36.0)
MCV: 61.2 fL — ABNORMAL LOW (ref 80.0–100.0)
Platelets: 474 10*3/uL — ABNORMAL HIGH (ref 150–400)
RBC: 3.35 MIL/uL — ABNORMAL LOW (ref 3.87–5.11)
RDW: 18.8 % — ABNORMAL HIGH (ref 11.5–15.5)
WBC: 9.1 10*3/uL (ref 4.0–10.5)
nRBC: 0.3 % — ABNORMAL HIGH (ref 0.0–0.2)

## 2020-07-05 LAB — VITAMIN B12: Vitamin B-12: 2636 pg/mL — ABNORMAL HIGH (ref 180–914)

## 2020-07-05 LAB — PREPARE RBC (CROSSMATCH)

## 2020-07-05 LAB — FOLATE: Folate: 9.8 ng/mL (ref >5.9)

## 2020-07-05 LAB — RESP PANEL BY RT-PCR (FLU A&B, COVID) ARPGX2
Influenza A by PCR: NEGATIVE
Influenza B by PCR: NEGATIVE
SARS Coronavirus 2 by RT PCR: NEGATIVE

## 2020-07-05 LAB — IRON AND TIBC
Iron: 10 ug/dL — ABNORMAL LOW (ref 28–170)
Saturation Ratios: 2 % — ABNORMAL LOW (ref 10.4–31.8)
TIBC: 484 ug/dL — ABNORMAL HIGH (ref 250–450)
UIBC: 474 ug/dL

## 2020-07-05 LAB — FERRITIN: Ferritin: 3 ng/mL — ABNORMAL LOW (ref 11–307)

## 2020-07-05 MED ORDER — TRAZODONE HCL 50 MG PO TABS
50.0000 mg | ORAL_TABLET | Freq: Every day | ORAL | Status: DC
  Administered 2020-07-05 – 2020-07-11 (×7): 50 mg via ORAL
  Filled 2020-07-05 (×7): qty 1

## 2020-07-05 MED ORDER — LORATADINE 10 MG PO TABS
10.0000 mg | ORAL_TABLET | Freq: Every day | ORAL | Status: DC
  Administered 2020-07-06 – 2020-07-12 (×4): 10 mg via ORAL
  Filled 2020-07-05 (×5): qty 1

## 2020-07-05 MED ORDER — PANTOPRAZOLE SODIUM 40 MG IV SOLR: 40.0000 mg | Freq: Two times a day (BID) | INTRAVENOUS | Status: DC

## 2020-07-05 MED ORDER — ACETAMINOPHEN 325 MG PO TABS
650.0000 mg | ORAL_TABLET | Freq: Four times a day (QID) | ORAL | Status: DC | PRN
  Administered 2020-07-06: 650 mg via ORAL
  Filled 2020-07-05: qty 2

## 2020-07-05 MED ORDER — VITAMIN B-12 1000 MCG PO TABS
1000.0000 ug | ORAL_TABLET | Freq: Every day | ORAL | Status: DC
  Administered 2020-07-06: 1000 ug via ORAL
  Filled 2020-07-05: qty 1

## 2020-07-05 MED ORDER — ACETAMINOPHEN 650 MG RE SUPP: 650.0000 mg | Freq: Four times a day (QID) | RECTAL | Status: DC | PRN

## 2020-07-05 MED ORDER — HYDRALAZINE HCL 20 MG/ML IJ SOLN: 5.0000 mg | INTRAMUSCULAR | Status: DC | PRN

## 2020-07-05 MED ORDER — PANTOPRAZOLE INFUSION (NEW) - SIMPLE MED
8.0000 mg/h | INTRAVENOUS | Status: AC
  Administered 2020-07-05 – 2020-07-08 (×6): 8 mg/h via INTRAVENOUS
  Filled 2020-07-05: qty 80
  Filled 2020-07-05 (×2): qty 100
  Filled 2020-07-05: qty 80
  Filled 2020-07-05: qty 100
  Filled 2020-07-05 (×2): qty 80

## 2020-07-05 MED ORDER — PANTOPRAZOLE 80MG IVPB - SIMPLE MED
80.0000 mg | Freq: Once | INTRAVENOUS | Status: AC
  Administered 2020-07-05: 80 mg via INTRAVENOUS
  Filled 2020-07-05: qty 80

## 2020-07-05 MED ORDER — LACTATED RINGERS IV SOLN: INTRAVENOUS | Status: DC

## 2020-07-05 MED ORDER — BUPROPION HCL ER (XL) 150 MG PO TB24
150.0000 mg | ORAL_TABLET | Freq: Every day | ORAL | Status: DC
  Administered 2020-07-05 – 2020-07-11 (×7): 150 mg via ORAL
  Filled 2020-07-05 (×7): qty 1

## 2020-07-05 MED ORDER — MELATONIN 5 MG PO TABS
2.5000 mg | ORAL_TABLET | Freq: Every day | ORAL | Status: DC
  Administered 2020-07-05 – 2020-07-11 (×7): 2.5 mg via ORAL
  Filled 2020-07-05 (×7): qty 1

## 2020-07-05 MED ORDER — SODIUM CHLORIDE 0.9% IV SOLUTION
Freq: Once | INTRAVENOUS | Status: AC
  Filled 2020-07-05: qty 250

## 2020-07-05 MED ORDER — NICOTINE 7 MG/24HR TD PT24
7.0000 mg | MEDICATED_PATCH | Freq: Every day | TRANSDERMAL | Status: DC
  Administered 2020-07-05 – 2020-07-12 (×7): 7 mg via TRANSDERMAL
  Filled 2020-07-05 (×8): qty 1

## 2020-07-05 MED ORDER — SODIUM CHLORIDE 0.9% FLUSH
3.0000 mL | Freq: Two times a day (BID) | INTRAVENOUS | Status: DC
  Administered 2020-07-05 – 2020-07-12 (×10): 3 mL via INTRAVENOUS

## 2020-07-05 MED ORDER — POTASSIUM CHLORIDE CRYS ER 20 MEQ PO TBCR
40.0000 meq | EXTENDED_RELEASE_TABLET | Freq: Once | ORAL | Status: DC
  Filled 2020-07-05: qty 2

## 2020-07-05 NOTE — ED Notes (Signed)
ED Provider at bedside. 

## 2020-07-05 NOTE — ED Notes (Signed)
Pt presents to ED with c/o of black tarry stools that have been ongoing for 1 month. Pt states recently seeing her PCP and had blood drawn and was told to come to ED "because I need some blood they told me". Pt denies ABD pain at this time" Pt is A&Ox4.  Pt denies taking blood thinners.  Pt states she has hemorrhoids but denies bright red blood.

## 2020-07-05 NOTE — ED Provider Notes (Signed)
[facility] Emergency Department Provider Note  ____________________________________________   Event Date/Time   First MD Initiated Contact with Patient 07/05/20 1206     (approximate)  I have reviewed the triage vital signs and the nursing notes.   HISTORY  Chief Complaint Abnormal Lab    HPI Colin Chauncey Cruel Bonenfant is a 86 y.o. female here with generalized weakness.  The patient states that over the last 2 to 3 weeks, she has had acute on chronic, significant worsening in shortness of breath.  She has been generally weak progressively over the last several months to years, but more recently, she began having dark, black, tarry colored stool.  This is been several weeks now.  She has subsequently developed progressively worsening shortness of breath, lightheadedness, and weakness.  She is having difficulty even doing household activities.  She is having difficulty even eating due to her shortness of breath.  Reports she has lost weight, feels fatigued, and cold often.  Denies any known history of GI bleed.  Denies history of gastritis or ulcers.  Denies any aspirin or NSAID use.  No abdominal pain.  No focal numbness or weakness.    Past Medical History:  Diagnosis Date  . Basal cell carcinoma 06/21/2020   left posterior shoulder  . Breast mass, left   . Hypertension   . Smoker   . 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   Past Surgical History:  Procedure Laterality Date  . APPENDECTOMY    . BREAST BIOPSY Left 05/16/2014   complex sclerosing lesion   . BREAST EXCISIONAL BIOPSY Left 2016   surgical exc to remove complex sclerosing lesion  . BREAST LUMPECTOMY WITH RADIOACTIVE SEED LOCALIZATION Left 06/20/2014   Procedure: BREAST LUMPECTOMY WITH RADIOACTIVE SEED LOCALIZATION;  Surgeon: Erroll Luna, MD;  Location: [place];  Service: General;  Laterality: Left;   . CAROTID ENDARTERECTOMY Right   . CATARACT EXTRACTION W/PHACO Right 06/25/2015   Procedure: CATARACT EXTRACTION PHACO AND INTRAOCULAR LENS PLACEMENT (IOC);  Surgeon: Estill Cotta, MD;  Location: [facility];  Service: Ophthalmology;  Laterality: Right;  [place] 2.01 AP% 24.5 CDE 52.16 Fluid pack lot # [id number]  . COLONOSCOPY    . EYE SURGERY    . KYPHOPLASTY N/A 01/27/2017   Procedure: [id number];  Surgeon: Hessie Knows, MD;  Location: [facility];  Service: Orthopedics;  Laterality: N/A;  T-9     Prior to Admission medications   Medication Sig Start Date End Date Taking? Authorizing Provider  alendronate (FOSAMAX) 70 MG tablet Take 70 mg by mouth once a week. 12/15/18   [provider]  aspirin 81 MG tablet Take 81 mg by mouth every evening.     [provider]  calcium carbonate (OS-CAL) 1250 (500 Ca) MG chewable tablet Chew 500 mg by mouth 2 (two) times daily.    [provider]  Cholecalciferol 100 MCG (4000 UT) CAPS Take 4,000 Units by mouth 2 (two) times a week.    [provider]  hydrochlorothiazide (HYDRODIURIL) 12.5 MG tablet Take 12.5 mg by mouth every evening.     [provider]  mupirocin ointment (BACTROBAN) 2 % Apply 1 application topically daily. 06/21/20   Moye, [place], MD  oxyCODONE (OXY IR/ROXICODONE) 5 MG immediate release tablet Take 5 mg by mouth 4 (four) times daily as needed for moderate pain or severe pain. 01/12/19   [provider]  traZODone (DESYREL) 50 MG tablet Take 50 mg by mouth at bedtime. 12/25/18   [provider]    Allergies Patient has no known allergies.  Family History  Problem Relation Age of Onset  . Breast cancer Neg Hx     Social History Social History   Tobacco Use  . Smoking status: Every Day    Packs/day: 0.50    Pack years: 0.00    Types: Cigarettes  . Smokeless tobacco: Never  Substance Use Topics  . Alcohol use: No  . Drug use: Never    Review of Systems   Review of Systems  Constitutional:  Positive for activity change, appetite change and fatigue. Negative for fever.  HENT:  Negative for congestion and sore throat.   Eyes:  Negative for visual disturbance.  Respiratory:  Negative for cough and shortness of breath.   Cardiovascular:  Negative for chest pain.  Gastrointestinal:  Positive for blood in stool. Negative for abdominal pain, diarrhea, nausea and vomiting.  Genitourinary:  Negative for flank pain.  Musculoskeletal:  Negative for back pain and neck pain.  Skin:  Negative for rash and wound.  Neurological:  Positive for weakness and light-headedness.  All other systems reviewed and are negative.   ____________________________________________  PHYSICAL EXAM:      VITAL SIGNS: ED Triage Vitals [07/05/20 1210]  Enc Vitals Group     BP (!) 114/92     Pulse Rate 96     Resp 18     Temp 98.9 F (37.2 C)     Temp Source Oral     SpO2 99 %     Weight 110 lb 3.7 oz (50 kg)     Height 5\' 1"  (1.549 m)     Head Circumference      Peak Flow      Pain Score 0     Pain Loc      Pain Edu?      Excl. in [place]?      Physical Exam Vitals and nursing note reviewed.  Constitutional:      General: She is not in acute distress.    Appearance: She is well-developed.  HENT:     Head: Normocephalic and atraumatic.  Eyes:     Conjunctiva/sclera: Conjunctivae normal.  Cardiovascular:     Rate and Rhythm: Normal rate and regular rhythm.     Heart sounds: Normal heart sounds. No murmur heard.   No friction rub.  Pulmonary:     Effort: Pulmonary effort is normal. No respiratory distress.     Breath sounds: Normal breath sounds. No wheezing or rales.  Abdominal:     General: There is no distension.     Palpations: Abdomen is soft.     Tenderness: There is no abdominal tenderness.  Musculoskeletal:     Cervical back: Neck supple.  Skin:    General: Skin is warm.     Capillary Refill: Capillary refill takes less than 2 seconds.   Neurological:     Mental Status: She is alert and oriented to person, place, and time.     Motor: No abnormal muscle tone.      ____________________________________________   LABS (all labs ordered are listed, but only abnormal results are displayed)  Labs Reviewed  COMPREHENSIVE METABOLIC PANEL - Abnormal; Notable for the following components:      Result Value   Potassium 3.2 (*)    Glucose, Bld 115 (*)    BUN 26 (*)  Creatinine, Ser 1.58 (*)    Total Protein 6.2 (*)    Albumin 3.4 (*)    GFR, Estimated 32 (*)    All other components within normal limits  CBC - Abnormal; Notable for the following components:   RBC 3.35 (*)    Hemoglobin 5.4 (*)    HCT 20.5 (*)    MCV 61.2 (*)    MCH 16.1 (*)    MCHC 26.3 (*)    RDW 18.8 (*)    Platelets 474 (*)    nRBC 0.3 (*)    All other components within normal limits  RESP PANEL BY RT-PCR (FLU A&B, COVID) ARPGX2  PROTIME-INR  OCCULT BLOOD X 1 CARD TO LAB, STOOL  VITAMIN B12  FOLATE  IRON AND TIBC  FERRITIN  RETICULOCYTES  POC OCCULT BLOOD, ED  TYPE AND SCREEN  PREPARE RBC (CROSSMATCH)    ____________________________________________  EKG:  ________________________________________  RADIOLOGY All imaging, including plain films, CT scans, and ultrasounds, independently reviewed by me, and interpretations confirmed via formal radiology reads.  ED MD interpretation:     Official radiology report(s): DG Chest Portable 1 View  Result Date: 07/05/2020 CLINICAL DATA:  Shortness of breath. EXAM: PORTABLE CHEST 1 VIEW COMPARISON:  01/15/2019 FINDINGS: Stable cardiomegaly. Lungs are clear. Calcified granuloma in the LEFT lung base. No pulmonary edema. Remote RIGHT rib fractures. IMPRESSION: Stable cardiomegaly.  No evidence for acute  abnormality. Electronically Signed   By: Nolon Nations M.D.   On: 07/05/2020 13:20    ____________________________________________  PROCEDURES   Procedure(s) performed (including  Critical Care):  .Critical Care  Date/Time: 07/05/2020 2:12 PM Performed by: Duffy Bruce, MD Authorized by: Duffy Bruce, MD   Critical care provider statement:    Critical care time (minutes):  35   Critical care time was exclusive of:  Separately billable procedures and treating other patients and teaching time   Critical care was necessary to treat or prevent imminent or life-threatening deterioration of the following conditions:  Cardiac failure and circulatory failure   Critical care was time spent personally by me on the following activities:  Development of treatment plan with patient or surrogate, discussions with consultants, evaluation of patient's response to treatment, examination of patient, obtaining history from patient or surrogate, ordering and performing treatments and interventions, ordering and review of laboratory studies, ordering and review of radiographic studies, pulse oximetry, re-evaluation of patient's condition and review of old charts   I assumed direction of critical care for this patient from another provider in my specialty: no    ____________________________________________  INITIAL IMPRESSION / MDM / [place] / ED COURSE  As part of my medical decision making, I reviewed the following data within the [place] notes reviewed and incorporated, Old chart reviewed, Notes from prior ED visits, and [place] Controlled Substance Database       *Helena S Vanwyk was evaluated in Emergency Department on 07/05/2020 for the symptoms described in the history of present illness. She was evaluated in the context of the global COVID-19 pandemic, which necessitated consideration that the patient might be at risk for infection with the SARS-CoV-2 virus that causes COVID-19. Institutional protocols and algorithms that pertain to the evaluation of patients at risk for COVID-19 are in a state of rapid change based on information released by  regulatory bodies including the CDC and federal and state organizations. These policies and algorithms were followed during the patient's care in the ED.  Some ED evaluations  and interventions may be delayed as a result of limited staffing during the pandemic.*     Medical Decision Making: Well-appearing 86 year old female who with generalized weakness and shortness of breath with exertion.  Chest x-ray is unremarkable.  Lab work shows significant, likely iron deficiency anemia with hemoglobin of 5.4, low MCV and elevated RDW.  Mild thrombocytosis noted.  CMP with likely mild prerenal AKI.  Patient is Hemoccult positive.  No signs of active significant bleed.  Will plan to transfuse, admit.  Anemia panel added on.  ____________________________________________  FINAL CLINICAL IMPRESSION(S) / ED DIAGNOSES  Final diagnoses:  Symptomatic anemia     MEDICATIONS GIVEN DURING THIS VISIT:  Medications  0.9 %  sodium chloride infusion (Manually program via Guardrails IV Fluids) (has no administration in time range)  potassium chloride SA (KLOR-CON) CR tablet 40 mEq (has no administration in time range)     ED Discharge Orders     None        Note:  This document was prepared using Dragon voice recognition software and may include unintentional dictation errors.   Duffy Bruce, MD 07/05/20 [phone]

## 2020-07-05 NOTE — ED Triage Notes (Signed)
Pt comes into the ED via POV with her sister c/o abnormal lab with a Hgb at 5.  Pt presents with pale mucous membranes. Pt does admit to dizziness, weakness, and SHOB.  Pt currently has even and unlabored respirations.  Pt does admit to seeing black stools.
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
## 2020-07-05 NOTE — ED Notes (Signed)
Lab called to assist with type and screen due to this RN not being able to find the appropriate tube(s) for the specimen collection. This RN was told "It would be put on the board".

## 2020-07-06 ENCOUNTER — Encounter
Admission: EM | Disposition: A | Discharge status: Home Health | Payer: Self-pay | Source: Home / Self Care | Attending: Internal Medicine

## 2020-07-06 ENCOUNTER — Encounter: Payer: Self-pay | Admitting: Gastroenterology

## 2020-07-06 ENCOUNTER — Inpatient Hospital Stay: Payer: PPO | Admitting: Certified Registered"

## 2020-07-06 DIAGNOSIS — F17200 Nicotine dependence, unspecified, uncomplicated: Secondary | ICD-10-CM

## 2020-07-06 DIAGNOSIS — E876 Hypokalemia: Secondary | ICD-10-CM

## 2020-07-06 DIAGNOSIS — N179 Acute kidney failure, unspecified: Secondary | ICD-10-CM

## 2020-07-06 DIAGNOSIS — K921 Melena: Principal | ICD-10-CM

## 2020-07-06 DIAGNOSIS — I10 Essential (primary) hypertension: Secondary | ICD-10-CM

## 2020-07-06 DIAGNOSIS — D500 Iron deficiency anemia secondary to blood loss (chronic): Secondary | ICD-10-CM

## 2020-07-06 DIAGNOSIS — D649 Anemia, unspecified: Secondary | ICD-10-CM

## 2020-07-06 HISTORY — PX: ESOPHAGOGASTRODUODENOSCOPY (EGD) WITH PROPOFOL: SHX5813

## 2020-07-06 LAB — CBC
HCT: 25.7 % — ABNORMAL LOW (ref 36.0–46.0)
Hemoglobin: 8.2 g/dL — ABNORMAL LOW (ref 12.0–15.0)
MCH: 20.9 pg — ABNORMAL LOW (ref 26.0–34.0)
MCHC: 31.9 g/dL (ref 30.0–36.0)
MCV: 65.4 fL — ABNORMAL LOW (ref 80.0–100.0)
Platelets: 386 10*3/uL (ref 150–400)
RBC: 3.93 MIL/uL (ref 3.87–5.11)
RDW: 25 % — ABNORMAL HIGH (ref 11.5–15.5)
WBC: 8.8 10*3/uL (ref 4.0–10.5)
nRBC: 0 % (ref 0.0–0.2)

## 2020-07-06 LAB — TYPE AND SCREEN
ABO/RH(D): O POS
Antibody Screen: NEGATIVE
Unit division: 0
Unit division: 0

## 2020-07-06 LAB — COMPREHENSIVE METABOLIC PANEL
ALT: <5 U/L (ref 0–44)
AST: 12 U/L — ABNORMAL LOW (ref 15–41)
Albumin: 2.9 g/dL — ABNORMAL LOW (ref 3.5–5.0)
Alkaline Phosphatase: 51 U/L (ref 38–126)
Anion gap: 5 (ref 5–15)
BUN: 19 mg/dL (ref 8–23)
CO2: 28 mmol/L (ref 22–32)
Calcium: 9 mg/dL (ref 8.9–10.3)
Chloride: 106 mmol/L (ref 98–111)
Creatinine, Ser: 1.31 mg/dL — ABNORMAL HIGH (ref 0.44–1.00)
GFR, Estimated: 40 mL/min — ABNORMAL LOW (ref >60)
Glucose, Bld: 81 mg/dL (ref 70–99)
Potassium: 3.6 mmol/L (ref 3.5–5.1)
Sodium: 139 mmol/L (ref 135–145)
Total Bilirubin: 2.5 mg/dL — ABNORMAL HIGH (ref 0.3–1.2)
Total Protein: 5.3 g/dL — ABNORMAL LOW (ref 6.5–8.1)

## 2020-07-06 LAB — BPAM RBC
Blood Product Expiration Date: 202207232359
Blood Product Expiration Date: 202207232359
ISSUE DATE / TIME: 202206231506
ISSUE DATE / TIME: 202206232025
Unit Type and Rh: 5100
Unit Type and Rh: 5100

## 2020-07-06 LAB — HEMOGLOBIN AND HEMATOCRIT, BLOOD
HCT: 27.9 % — ABNORMAL LOW (ref 36.0–46.0)
Hemoglobin: 8.5 g/dL — ABNORMAL LOW (ref 12.0–15.0)

## 2020-07-06 LAB — TSH: TSH: 1.077 u[IU]/mL (ref 0.350–4.500)

## 2020-07-06 SURGERY — ESOPHAGOGASTRODUODENOSCOPY (EGD) WITH PROPOFOL
Anesthesia: General

## 2020-07-06 MED ORDER — PROPOFOL 10 MG/ML IV BOLUS
INTRAVENOUS | Status: AC
  Filled 2020-07-06: qty 20

## 2020-07-06 MED ORDER — LIDOCAINE HCL (CARDIAC) PF 100 MG/5ML IV SOSY
PREFILLED_SYRINGE | INTRAVENOUS | Status: DC | PRN
  Administered 2020-07-06: 50 mg via INTRAVENOUS

## 2020-07-06 MED ORDER — PROPOFOL 500 MG/50ML IV EMUL
INTRAVENOUS | Status: DC | PRN
  Administered 2020-07-06: 150 ug/kg/min via INTRAVENOUS

## 2020-07-06 MED ORDER — PEG 3350-KCL-NA BICARB-NACL 420 G PO SOLR
4000.0000 mL | Freq: Once | ORAL | Status: AC
  Administered 2020-07-06: 4000 mL via ORAL
  Filled 2020-07-06: qty 4000

## 2020-07-06 MED ORDER — PROPOFOL 10 MG/ML IV BOLUS
INTRAVENOUS | Status: DC | PRN
  Administered 2020-07-06: 60 mg via INTRAVENOUS
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
## 2020-07-06 NOTE — Anesthesia Postprocedure Evaluation (Signed)
Anesthesia Post Note  Patient: Tina Ingram  Procedure(s) Performed: ESOPHAGOGASTRODUODENOSCOPY (EGD) WITH PROPOFOL  Patient location during evaluation: Phase II Anesthesia Type: General Level of consciousness: awake and alert, awake and oriented Pain management: pain level controlled Vital Signs Assessment: post-procedure vital signs reviewed and stable Respiratory status: spontaneous breathing, nonlabored ventilation and respiratory function stable Cardiovascular status: blood pressure returned to baseline and stable Postop Assessment: no apparent nausea or vomiting Anesthetic complications: no   No notable events documented.   Last Vitals:  Vitals:   07/06/20 1500 07/06/20 1524  BP: (!) 110/48 (!) 138/58  Pulse: 83 77  Resp: (!) 25 18  Temp:  36.6 C  SpO2: 92% 93%    Last Pain:  Vitals:   07/06/20 1524  TempSrc: Oral  PainSc:                  Phill Mutter

## 2020-07-06 NOTE — Transfer of Care (Signed)
Immediate Anesthesia Transfer of Care Note  Patient: Tina Ingram  Procedure(s) Performed: ESOPHAGOGASTRODUODENOSCOPY (EGD) WITH PROPOFOL  Patient Location: PACU  Anesthesia Type:General  Level of Consciousness: drowsy  Airway & Oxygen Therapy: Patient Spontanous Breathing  Post-op Assessment: Report given to RN and Post -op Vital signs reviewed and stable  Post vital signs: stable  Last Vitals:  Vitals Value Taken Time  BP 116/64 07/06/20 1444  Temp 36.7 C 07/06/20 1440  Pulse 83 07/06/20 1446  Resp 20 07/06/20 1446  SpO2 90 % 07/06/20 1446  Vitals shown include unvalidated device data.  Last Pain:  Vitals:   07/06/20 1440  TempSrc: Temporal  PainSc: Asleep         Complications: No notable events documented.

## 2020-07-06 NOTE — Op Note (Signed)
[facility] Gastroenterology Patient Name: Tina Ingram Procedure Date: 07/06/2020 2:01 PM MRN: [id number] Account #: [id number] Date of Birth: Jul 17, 1934 Admit Type: Inpatient Age: 86 Room: [facility] ENDO ROOM 4 Gender: Female Note Status: Finalized Procedure:             Upper GI endoscopy Indications:           Melena Providers:             [name] MD, MD Referring MD:          Caprice Renshaw MD (Referring MD) Medicines:             Propofol per Anesthesia Complications:         No immediate complications. Procedure:             Pre-Anesthesia Assessment:                        - Prior to the procedure, a History and Physical was                         performed, and patient medications and allergies were                         reviewed. The patient's tolerance of previous                         anesthesia was also reviewed. The risks and benefits                         of the procedure and the sedation options and risks                         were discussed with the patient. All questions were                         answered, and informed consent was obtained. Prior                         Anticoagulants: The patient has taken no previous                         anticoagulant or antiplatelet agents. ASA Grade                         Assessment: II - A patient with mild systemic disease.                         After reviewing the risks and benefits, the patient                         was deemed in satisfactory condition to undergo the                         procedure.                        After obtaining informed consent, the endoscope was  passed under direct vision. Throughout the procedure,                         the patient's blood pressure, pulse, and oxygen                         saturations were monitored continuously. The Endoscope                         was introduced through the mouth, and advanced to the                          second part of duodenum. The upper GI endoscopy was                         accomplished without difficulty. The patient tolerated                         the procedure well. Findings:      The examined esophagus was normal.      The esophagus was normal.      The examined duodenum was normal. Impression:            - Normal esophagus.                        - Normal esophagus.                        - Normal examined duodenum.                        - No specimens collected. Recommendation:        - Return patient to hospital ward for ongoing care.                        - Clear liquid diet.                        - Perform a colonoscopy tomorrow. Procedure Code(s):     --- Professional ---                        [phone], Esophagogastroduodenoscopy, flexible,                         transoral; diagnostic, including collection of                         specimen(s) by brushing or washing, when performed                         (separate procedure) Diagnosis Code(s):     --- Professional ---                        K92.1, Melena (includes Hematochezia) CPT copyright 2019 American Medical Association. All rights reserved. The codes documented in this report are preliminary and upon coder review may  be revised to meet current compliance requirements. [name] MD, MD 07/06/2020 2:37:16 PM This report has been signed electronically. Number of Addenda: 0 Note Initiated On: 07/06/2020 2:01 PM Estimated  Blood Loss:  Estimated blood loss: none.      [facility]

## 2020-07-06 NOTE — Clinical Social Work Note (Signed)
CSW acknowledges consult for HH/DME needs. Please consult PT and OT when appropriate to evaluate potential needs.  Dayton Scrape, [employer]

## 2020-07-06 NOTE — Plan of Care (Signed)
  Problem: Clinical Measurements: Goal: Ability to maintain clinical measurements within normal limits will improve Outcome: Progressing Goal: Will remain free from infection Outcome: Progressing Goal: Diagnostic test results will improve Outcome: Progressing Goal: Respiratory complications will improve Outcome: Progressing Goal: Cardiovascular complication will be avoided Outcome: Progressing   Problem: Pain Managment: Goal: General experience of comfort will improve Outcome: Progressing   Pt is involved in and agrees with the plan of care. V/S stable. Received 1 unit of blood this shift; tolerated well - no transfusion reactions. No complaints of pain or SOB. Alert but forgetful.

## 2020-07-07 ENCOUNTER — Encounter: Payer: Self-pay | Admitting: Family Medicine

## 2020-07-07 ENCOUNTER — Inpatient Hospital Stay: Payer: PPO | Admitting: Anesthesiology

## 2020-07-07 ENCOUNTER — Encounter
Admission: EM | Disposition: A | Discharge status: Home Health | Payer: Self-pay | Source: Home / Self Care | Attending: Internal Medicine

## 2020-07-07 HISTORY — PX: COLONOSCOPY WITH PROPOFOL: SHX5780

## 2020-07-07 LAB — CBC WITH DIFFERENTIAL/PLATELET
Abs Immature Granulocytes: 0.07 10*3/uL (ref 0.00–0.07)
Basophils Absolute: 0 10*3/uL (ref 0.0–0.1)
Basophils Relative: 0 %
Eosinophils Absolute: 0.2 10*3/uL (ref 0.0–0.5)
Eosinophils Relative: 2 %
HCT: 26.9 % — ABNORMAL LOW (ref 36.0–46.0)
Hemoglobin: 8.1 g/dL — ABNORMAL LOW (ref 12.0–15.0)
Immature Granulocytes: 1 %
Lymphocytes Relative: 7 %
Lymphs Abs: 0.7 10*3/uL (ref 0.7–4.0)
MCH: 20.6 pg — ABNORMAL LOW (ref 26.0–34.0)
MCHC: 30.1 g/dL (ref 30.0–36.0)
MCV: 68.3 fL — ABNORMAL LOW (ref 80.0–100.0)
Monocytes Absolute: 0.5 10*3/uL (ref 0.1–1.0)
Monocytes Relative: 6 %
Neutro Abs: 8.4 10*3/uL — ABNORMAL HIGH (ref 1.7–7.7)
Neutrophils Relative %: 84 %
Platelets: 370 10*3/uL (ref 150–400)
RBC: 3.94 MIL/uL (ref 3.87–5.11)
RDW: 26.2 % — ABNORMAL HIGH (ref 11.5–15.5)
WBC: 9.8 10*3/uL (ref 4.0–10.5)
nRBC: 0 % (ref 0.0–0.2)

## 2020-07-07 LAB — BASIC METABOLIC PANEL
Anion gap: 7 (ref 5–15)
BUN: 15 mg/dL (ref 8–23)
CO2: 25 mmol/L (ref 22–32)
Calcium: 8.8 mg/dL — ABNORMAL LOW (ref 8.9–10.3)
Chloride: 106 mmol/L (ref 98–111)
Creatinine, Ser: 1.16 mg/dL — ABNORMAL HIGH (ref 0.44–1.00)
GFR, Estimated: 47 mL/min — ABNORMAL LOW (ref >60)
Glucose, Bld: 74 mg/dL (ref 70–99)
Potassium: 3.7 mmol/L (ref 3.5–5.1)
Sodium: 138 mmol/L (ref 135–145)

## 2020-07-07 SURGERY — COLONOSCOPY WITH PROPOFOL
Anesthesia: Monitor Anesthesia Care

## 2020-07-07 MED ORDER — PHENYLEPHRINE HCL (PRESSORS) 10 MG/ML IV SOLN
INTRAVENOUS | Status: DC | PRN
  Administered 2020-07-07: 100 ug via INTRAVENOUS

## 2020-07-07 MED ORDER — SODIUM CHLORIDE 0.9 % IV SOLN: INTRAVENOUS | Status: DC | PRN

## 2020-07-07 MED ORDER — PROPOFOL 500 MG/50ML IV EMUL
INTRAVENOUS | Status: DC | PRN
  Administered 2020-07-07: 80 ug/kg/min via INTRAVENOUS

## 2020-07-07 MED ORDER — PROPOFOL 10 MG/ML IV BOLUS
INTRAVENOUS | Status: AC
  Filled 2020-07-07: qty 20

## 2020-07-07 MED ORDER — SODIUM CHLORIDE 0.9 % IV SOLN: INTRAVENOUS | Status: DC

## 2020-07-07 MED ORDER — PROPOFOL 10 MG/ML IV BOLUS
INTRAVENOUS | Status: DC | PRN
  Administered 2020-07-07: 30 mg via INTRAVENOUS

## 2020-07-07 MED ORDER — LIDOCAINE HCL (CARDIAC) PF 100 MG/5ML IV SOSY
PREFILLED_SYRINGE | INTRAVENOUS | Status: DC | PRN
  Administered 2020-07-07: 60 mg via INTRAVENOUS

## 2020-07-07 MED ORDER — SODIUM CHLORIDE 0.9 % IV SOLN
510.0000 mg | Freq: Once | INTRAVENOUS | Status: AC
  Administered 2020-07-07: 510 mg via INTRAVENOUS
  Filled 2020-07-07: qty 17

## 2020-07-07 NOTE — Op Note (Signed)
[facility] Gastroenterology Patient Name: Tina Ingram Procedure Date: 07/07/2020 9:31 AM MRN: [id number] Account #: [id number] Date of Birth: 27-Oct-1934 Admit Type: Inpatient Age: 86 Room: [facility] ENDO ROOM 3 Gender: Female Note Status: Finalized Procedure:             Colonoscopy Indications:           Melena Providers:             Andrey Farmer MD, MD Referring MD:          Caprice Renshaw MD (Referring MD) Medicines:             Monitored Anesthesia Care Complications:         No immediate complications. Procedure:             Pre-Anesthesia Assessment:                        - Prior to the procedure, a History and Physical was                         performed, and patient medications and allergies were                         reviewed. The patient is competent. The risks and                         benefits of the procedure and the sedation options and                         risks were discussed with the patient. All questions                         were answered and informed consent was obtained.                         Patient identification and proposed procedure were                         verified by the physician, the nurse, the anesthetist                         and the technician in the endoscopy suite. Mental                         Status Examination: alert and oriented. Airway                         Examination: normal oropharyngeal airway and neck                         mobility. Respiratory Examination: clear to                         auscultation. CV Examination: normal. Prophylactic                         Antibiotics: The patient does not require prophylactic  antibiotics. Prior Anticoagulants: The patient has                         taken no previous anticoagulant or antiplatelet                         agents. ASA Grade Assessment: III - A patient with                         severe systemic disease. After  reviewing the risks and                         benefits, the patient was deemed in satisfactory                         condition to undergo the procedure. The anesthesia                         plan was to use monitored anesthesia care (MAC).                         Immediately prior to administration of medications,                         the patient was re-assessed for adequacy to receive                         sedatives. The heart rate, respiratory rate, oxygen                         saturations, blood pressure, adequacy of pulmonary                         ventilation, and response to care were monitored                         throughout the procedure. The physical status of the                         patient was re-assessed after the procedure.                        After obtaining informed consent, the colonoscope was                         passed under direct vision. Throughout the procedure,                         the patient's blood pressure, pulse, and oxygen                         saturations were monitored continuously. The was                         introduced through the anus and advanced to the the                         terminal ileum. The colonoscopy was performed without  difficulty. The patient tolerated the procedure well.                         The quality of the bowel preparation was fair. Findings:      The perianal and digital rectal examinations were normal.      The terminal ileum appeared normal.      Internal hemorrhoids were found during retroflexion. The hemorrhoids       were Grade I (internal hemorrhoids that do not prolapse).      The exam was otherwise without abnormality on direct and retroflexion       views. Impression:            - Preparation of the colon was fair.                        - The examined portion of the ileum was normal.                        - Internal hemorrhoids.                        - The  examination was otherwise normal on direct and                         retroflexion views.                        - No specimens collected. Recommendation:        - Return patient to hospital ward for ongoing care.                        - Can have lunch today and then clear liquids for                         dinner.                        - Continue present medications.                        - To visualize the small bowel, perform video capsule                         endoscopy tomorrow. Procedure Code(s):     --- Professional ---                        [phone], Colonoscopy, flexible; diagnostic, including                         collection of specimen(s) by brushing or washing, when                         performed (separate procedure) Diagnosis Code(s):     --- Professional ---                        K64.0, First degree hemorrhoids                        K92.1, Melena (includes Hematochezia) CPT copyright 2019 [place]  [employer]. All rights reserved. The codes documented in this report are preliminary and upon coder review may  be revised to meet current compliance requirements. Andrey Farmer MD, MD 07/07/2020 10:03:48 AM Number of Addenda: 0 Note Initiated On: 07/07/2020 9:31 AM Scope Withdrawal Time: 0 hours 6 minutes 1 second  Total Procedure Duration: 0 hours 14 minutes 11 seconds  Estimated Blood Loss:  Estimated blood loss: none.      [facility]
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
## 2020-07-07 NOTE — Care Plan (Signed)
Patient evaluated prior to procedure. Ok to proceed with colonoscopy.  Raylene Miyamoto MD, MPH [place]

## 2020-07-07 NOTE — Transfer of Care (Signed)
Immediate Anesthesia Transfer of Care Note  Patient: Tina Ingram  Procedure(s) Performed: COLONOSCOPY WITH PROPOFOL  Patient Location: PACU  Anesthesia Type:General  Level of Consciousness: sedated  Airway & Oxygen Therapy: Patient Spontanous Breathing and Patient connected to face mask oxygen  Post-op Assessment: Report given to RN and Post -op Vital signs reviewed and stable  Post vital signs: Reviewed and stable  Last Vitals:  Vitals Value Taken Time  BP    Temp    Pulse    Resp    SpO2      Last Pain:  Vitals:   07/07/20 0928  TempSrc:   PainSc: 0-No pain         Complications: No notable events documented.
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
## 2020-07-07 NOTE — Anesthesia Postprocedure Evaluation (Signed)
Anesthesia Post Note  Patient: Tina Ingram  Procedure(s) Performed: COLONOSCOPY WITH PROPOFOL  Patient location during evaluation: Endoscopy Anesthesia Type: MAC Level of consciousness: awake and alert Pain management: pain level controlled Vital Signs Assessment: post-procedure vital signs reviewed and stable Respiratory status: spontaneous breathing, nonlabored ventilation, respiratory function stable and patient connected to nasal cannula oxygen Cardiovascular status: blood pressure returned to baseline and stable Postop Assessment: no apparent nausea or vomiting Anesthetic complications: no   No notable events documented.   Last Vitals:  Vitals:   07/07/20 1003 07/07/20 1023  BP: 129/61 (!) 126/45  Pulse:    Resp:    Temp:    SpO2:      Last Pain:  Vitals:   07/07/20 1033  TempSrc:   PainSc: 0-No pain                 Aswad Wandrey

## 2020-07-07 NOTE — Progress Notes (Signed)
Pt refusing to drink her bowel prep; only tolerated 1/2 of the bottle. Will inform the provider. BM noted several times, watery yellowish stool.
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
## 2020-07-07 NOTE — Plan of Care (Signed)
0

## 2020-07-08 ENCOUNTER — Encounter
Admission: EM | Disposition: A | Discharge status: Home Health | Payer: Self-pay | Source: Home / Self Care | Attending: Internal Medicine

## 2020-07-08 LAB — CBC WITH DIFFERENTIAL/PLATELET
Abs Immature Granulocytes: 0.1 10*3/uL — ABNORMAL HIGH (ref 0.00–0.07)
Basophils Absolute: 0 10*3/uL (ref 0.0–0.1)
Basophils Relative: 0 %
Eosinophils Absolute: 0.2 10*3/uL (ref 0.0–0.5)
Eosinophils Relative: 2 %
HCT: 27.3 % — ABNORMAL LOW (ref 36.0–46.0)
Hemoglobin: 8.4 g/dL — ABNORMAL LOW (ref 12.0–15.0)
Immature Granulocytes: 1 %
Lymphocytes Relative: 9 %
Lymphs Abs: 0.8 10*3/uL (ref 0.7–4.0)
MCH: 20.4 pg — ABNORMAL LOW (ref 26.0–34.0)
MCHC: 30.8 g/dL (ref 30.0–36.0)
MCV: 66.4 fL — ABNORMAL LOW (ref 80.0–100.0)
Monocytes Absolute: 0.6 10*3/uL (ref 0.1–1.0)
Monocytes Relative: 7 %
Neutro Abs: 7.1 10*3/uL (ref 1.7–7.7)
Neutrophils Relative %: 81 %
Platelets: 355 10*3/uL (ref 150–400)
RBC: 4.11 MIL/uL (ref 3.87–5.11)
RDW: 27 % — ABNORMAL HIGH (ref 11.5–15.5)
Smear Review: NORMAL
WBC: 8.8 10*3/uL (ref 4.0–10.5)
nRBC: 0 % (ref 0.0–0.2)

## 2020-07-08 LAB — BASIC METABOLIC PANEL
Anion gap: 7 (ref 5–15)
BUN: 10 mg/dL (ref 8–23)
CO2: 24 mmol/L (ref 22–32)
Calcium: 8.6 mg/dL — ABNORMAL LOW (ref 8.9–10.3)
Chloride: 109 mmol/L (ref 98–111)
Creatinine, Ser: 1.05 mg/dL — ABNORMAL HIGH (ref 0.44–1.00)
GFR, Estimated: 53 mL/min — ABNORMAL LOW (ref >60)
Glucose, Bld: 72 mg/dL (ref 70–99)
Potassium: 3.6 mmol/L (ref 3.5–5.1)
Sodium: 140 mmol/L (ref 135–145)

## 2020-07-08 SURGERY — IMAGING PROCEDURE, GI TRACT, INTRALUMINAL, VIA CAPSULE

## 2020-07-08 MED ORDER — PANTOPRAZOLE SODIUM 40 MG IV SOLR
40.0000 mg | Freq: Every day | INTRAVENOUS | Status: DC
  Administered 2020-07-08: 40 mg via INTRAVENOUS
  Filled 2020-07-08: qty 40

## 2020-07-08 MED ORDER — IPRATROPIUM-ALBUTEROL 0.5-2.5 (3) MG/3ML IN SOLN
3.0000 mL | Freq: Once | RESPIRATORY_TRACT | Status: AC
  Administered 2020-07-08: 3 mL via RESPIRATORY_TRACT
  Filled 2020-07-08: qty 3

## 2020-07-08 MED ORDER — AMLODIPINE BESYLATE 5 MG PO TABS
2.5000 mg | ORAL_TABLET | Freq: Every day | ORAL | Status: DC
  Administered 2020-07-08 – 2020-07-09 (×2): 2.5 mg via ORAL
  Filled 2020-07-08 (×2): qty 1

## 2020-07-08 NOTE — Care Plan (Signed)
VCE attempted but patient unable to tolerate. Can resume diet but make NPO at midnight. Will discuss with day GI team tomorrow about endoscopically placed VCE as inpatient or outpatient.  Raylene Miyamoto MD, MPH [place]
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
## 2020-07-08 NOTE — Progress Notes (Signed)
PT Cancellation Note  Patient Details Name: Tina Ingram MRN: [id number] DOB: [birth year]   Cancelled Treatment:    Reason Eval/Treat Not Completed: Patient declined, no reason specified (Consult received and chart reviewed.  Evaluation attempted. Patient and family member/visitor at bedside declined participation with session this date, voicing significant fatigue.  Request therapist allow patient to rest and re-attempt next date.  Will continue to follow and initiate as medically appropriate and available.)   Kamilya Wakeman H. Owens Shark, PT, DPT, NCS 07/08/20, 11:39 AM [phone]

## 2020-07-08 NOTE — Progress Notes (Signed)
After multiple attempts and position changes, Tina Ingram was unable to swallow the video capsule for the [facility] Capsule Study. Dr. Haig Prophet was notified of this via telephone. Patient's primary nurse was notified of this as well.

## 2020-07-09 ENCOUNTER — Encounter: Payer: Self-pay | Admitting: Gastroenterology

## 2020-07-09 ENCOUNTER — Other Ambulatory Visit: Payer: Self-pay

## 2020-07-09 ENCOUNTER — Telehealth: Payer: Self-pay

## 2020-07-09 DIAGNOSIS — C4492 Squamous cell carcinoma of skin, unspecified: Secondary | ICD-10-CM

## 2020-07-09 DIAGNOSIS — D500 Iron deficiency anemia secondary to blood loss (chronic): Secondary | ICD-10-CM

## 2020-07-09 LAB — CBC WITH DIFFERENTIAL/PLATELET
Abs Immature Granulocytes: 0.09 10*3/uL — ABNORMAL HIGH (ref 0.00–0.07)
Basophils Absolute: 0.1 10*3/uL (ref 0.0–0.1)
Basophils Relative: 1 %
Eosinophils Absolute: 0.2 10*3/uL (ref 0.0–0.5)
Eosinophils Relative: 2 %
HCT: 28.1 % — ABNORMAL LOW (ref 36.0–46.0)
Hemoglobin: 8.6 g/dL — ABNORMAL LOW (ref 12.0–15.0)
Immature Granulocytes: 1 %
Lymphocytes Relative: 7 %
Lymphs Abs: 0.7 10*3/uL (ref 0.7–4.0)
MCH: 20.9 pg — ABNORMAL LOW (ref 26.0–34.0)
MCHC: 30.6 g/dL (ref 30.0–36.0)
MCV: 68.4 fL — ABNORMAL LOW (ref 80.0–100.0)
Monocytes Absolute: 0.6 10*3/uL (ref 0.1–1.0)
Monocytes Relative: 7 %
Neutro Abs: 7.9 10*3/uL — ABNORMAL HIGH (ref 1.7–7.7)
Neutrophils Relative %: 82 %
Platelets: 372 10*3/uL (ref 150–400)
RBC: 4.11 MIL/uL (ref 3.87–5.11)
RDW: 27.4 % — ABNORMAL HIGH (ref 11.5–15.5)
WBC: 9.6 10*3/uL (ref 4.0–10.5)
nRBC: 0 % (ref 0.0–0.2)

## 2020-07-09 LAB — BASIC METABOLIC PANEL
Anion gap: 3 — ABNORMAL LOW (ref 5–15)
BUN: 10 mg/dL (ref 8–23)
CO2: 25 mmol/L (ref 22–32)
Calcium: 8.7 mg/dL — ABNORMAL LOW (ref 8.9–10.3)
Chloride: 110 mmol/L (ref 98–111)
Creatinine, Ser: 1.15 mg/dL — ABNORMAL HIGH (ref 0.44–1.00)
GFR, Estimated: 47 mL/min — ABNORMAL LOW (ref >60)
Glucose, Bld: 67 mg/dL — ABNORMAL LOW (ref 70–99)
Potassium: 3.4 mmol/L — ABNORMAL LOW (ref 3.5–5.1)
Sodium: 138 mmol/L (ref 135–145)

## 2020-07-09 MED ORDER — PANTOPRAZOLE SODIUM 40 MG PO TBEC
40.0000 mg | DELAYED_RELEASE_TABLET | Freq: Every day | ORAL | Status: DC
  Administered 2020-07-09 – 2020-07-12 (×3): 40 mg via ORAL
  Filled 2020-07-09 (×3): qty 1

## 2020-07-09 MED ORDER — POTASSIUM CHLORIDE CRYS ER 20 MEQ PO TBCR
40.0000 meq | EXTENDED_RELEASE_TABLET | Freq: Once | ORAL | Status: AC
  Administered 2020-07-09: 40 meq via ORAL
  Filled 2020-07-09: qty 2

## 2020-07-09 MED ORDER — DEXTROSE-NACL 5-0.9 % IV SOLN: INTRAVENOUS | Status: DC

## 2020-07-09 MED ORDER — HYDROCHLOROTHIAZIDE 25 MG PO TABS: 12.5000 mg | ORAL_TABLET | Freq: Every day | ORAL | Status: DC

## 2020-07-09 NOTE — Telephone Encounter (Signed)
Pt sister calling to let Dr Laurence Ferrari know this patient is hospitalized for internal bleeding issues, pt sister would like for [place] to make a referral to the skin surgery center in [facility] for Mohs treatment and soon as pt is home and doing better she will have the patient call here to let us know, pt will plan on having a EDC here in the office at her follow up appt in August.

## 2020-07-09 NOTE — Care Management Important Message (Signed)
Important Message  Patient Details  Name: Tina Ingram MRN: [id number] Date of Birth: 1934/02/23   Medicare Important Message Given:  Yes     Dannette Barbara 07/09/2020, 11:59 AM
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
## 2020-07-09 NOTE — TOC Transition Note (Addendum)
Transition of Care [facility]) - CM/SW Discharge Note   Patient Details  Name: Tina Ingram MRN: [id number] Date of Birth: 1934-09-17  [facility] CM/SW Contact:  Kerin Salen, RN Phone Number: 07/09/2020, 1:29 PM   Clinical Narrative: Spoke with patient about discharge recommendation for HHPT/OT, she agrees and has no preference, says she had service years ago. [facility] representative Floydene Flock notified will call back when arranged. TOC to continue to track.   Corene Cornea, called to say they are not able to provide [facility] Services due to staffing. [place] referrals sent to [facility] and [facility], reply pending.          Patient Goals and CMS Choice        Discharge Placement                       Discharge Plan and Services                                     Social Determinants of Health (SDOH) Interventions     Readmission Risk Interventions No flowsheet data found.
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
## 2020-07-10 ENCOUNTER — Encounter: Payer: Self-pay | Admitting: Family Medicine

## 2020-07-10 ENCOUNTER — Inpatient Hospital Stay: Payer: PPO | Admitting: Certified Registered"

## 2020-07-10 ENCOUNTER — Encounter
Admission: EM | Disposition: A | Discharge status: Home Health | Payer: Self-pay | Source: Home / Self Care | Attending: Internal Medicine

## 2020-07-10 HISTORY — PX: ESOPHAGOGASTRODUODENOSCOPY: SHX5428

## 2020-07-10 LAB — CBC WITH DIFFERENTIAL/PLATELET
Abs Immature Granulocytes: 0.06 10*3/uL (ref 0.00–0.07)
Basophils Absolute: 0 10*3/uL (ref 0.0–0.1)
Basophils Relative: 0 %
Eosinophils Absolute: 0.3 10*3/uL (ref 0.0–0.5)
Eosinophils Relative: 3 %
HCT: 30.1 % — ABNORMAL LOW (ref 36.0–46.0)
Hemoglobin: 9 g/dL — ABNORMAL LOW (ref 12.0–15.0)
Immature Granulocytes: 1 %
Lymphocytes Relative: 10 %
Lymphs Abs: 0.9 10*3/uL (ref 0.7–4.0)
MCH: 20.5 pg — ABNORMAL LOW (ref 26.0–34.0)
MCHC: 29.9 g/dL — ABNORMAL LOW (ref 30.0–36.0)
MCV: 68.6 fL — ABNORMAL LOW (ref 80.0–100.0)
Monocytes Absolute: 0.6 10*3/uL (ref 0.1–1.0)
Monocytes Relative: 7 %
Neutro Abs: 6.8 10*3/uL (ref 1.7–7.7)
Neutrophils Relative %: 79 %
Platelets: 386 10*3/uL (ref 150–400)
RBC: 4.39 MIL/uL (ref 3.87–5.11)
RDW: 28.4 % — ABNORMAL HIGH (ref 11.5–15.5)
Smear Review: NORMAL
WBC: 8.5 10*3/uL (ref 4.0–10.5)
nRBC: 0 % (ref 0.0–0.2)

## 2020-07-10 LAB — BASIC METABOLIC PANEL
Anion gap: 5 (ref 5–15)
BUN: 12 mg/dL (ref 8–23)
CO2: 25 mmol/L (ref 22–32)
Calcium: 9.1 mg/dL (ref 8.9–10.3)
Chloride: 109 mmol/L (ref 98–111)
Creatinine, Ser: 1.18 mg/dL — ABNORMAL HIGH (ref 0.44–1.00)
GFR, Estimated: 46 mL/min — ABNORMAL LOW (ref >60)
Glucose, Bld: 75 mg/dL (ref 70–99)
Potassium: 3.6 mmol/L (ref 3.5–5.1)
Sodium: 139 mmol/L (ref 135–145)

## 2020-07-10 SURGERY — EGD (ESOPHAGOGASTRODUODENOSCOPY)
Anesthesia: General

## 2020-07-10 MED ORDER — BENZONATATE 100 MG PO CAPS: 100.0000 mg | ORAL_CAPSULE | Freq: Three times a day (TID) | ORAL | Status: DC | PRN

## 2020-07-10 MED ORDER — AMLODIPINE BESYLATE 5 MG PO TABS
5.0000 mg | ORAL_TABLET | Freq: Every day | ORAL | Status: DC
  Administered 2020-07-11 – 2020-07-12 (×2): 5 mg via ORAL
  Filled 2020-07-10 (×2): qty 1

## 2020-07-10 MED ORDER — LIDOCAINE 2% (20 MG/ML) 5 ML SYRINGE
INTRAMUSCULAR | Status: DC | PRN
  Administered 2020-07-10: 20 mg via INTRAVENOUS

## 2020-07-10 MED ORDER — AMLODIPINE BESYLATE 5 MG PO TABS
2.5000 mg | ORAL_TABLET | Freq: Once | ORAL | Status: AC
  Administered 2020-07-10: 2.5 mg via ORAL
  Filled 2020-07-10: qty 1

## 2020-07-10 MED ORDER — PROPOFOL 10 MG/ML IV BOLUS
INTRAVENOUS | Status: DC | PRN
  Administered 2020-07-10 (×2): 50 mg via INTRAVENOUS

## 2020-07-10 MED ORDER — HYDROCHLOROTHIAZIDE 25 MG PO TABS: 12.5000 mg | ORAL_TABLET | Freq: Every day | ORAL | Status: DC

## 2020-07-10 MED ORDER — SODIUM CHLORIDE 0.9 % IV SOLN: INTRAVENOUS | Status: DC

## 2020-07-10 NOTE — TOC Progression Note (Signed)
Transition of Care [facility]) - Progression Note    Patient Details  Name: Tina Ingram MRN: [id number] Date of Birth: 11-Jun-1934  [facility] CM/SW [place], RN Phone Number: 07/10/2020, 12:24 PM  Clinical Narrative: Spoke  with patient today and Attending Dr. Grandville Silos about discharge plans home with home health. [facility] will provide PT/OT/Aide/ST for patient when discharged. Patient very appreciative, attempted to call Chancy Hurter, no answer left voice message to return call.          Expected Discharge Plan and Services                                                 Social Determinants of Health (SDOH) Interventions    Readmission Risk Interventions No flowsheet data found.
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
## 2020-07-10 NOTE — Progress Notes (Signed)
Mobility Specialist - Progress Note   07/10/20 1550  Mobility  Activity Refused mobility  Mobility performed by Mobility specialist    2nd attempt this date, pt initially requested to return later this date as she was feeling weak and waiting to eat before attempting OOB activity. Pt was NPO earlier this date for procedure. Upon second attempt, pt requested to return tomorrow for ambulation, no reason specified. Will attempt session next date.    Kathee Delton Mobility Specialist 07/10/20, 4:00 PM

## 2020-07-10 NOTE — Anesthesia Postprocedure Evaluation (Signed)
Anesthesia Post Note  Patient: Tina Ingram  Procedure(s) Performed: ESOPHAGOGASTRODUODENOSCOPY (EGD)  Patient location during evaluation: Endoscopy Anesthesia Type: General Level of consciousness: awake and alert Pain management: pain level controlled Vital Signs Assessment: post-procedure vital signs reviewed and stable Respiratory status: spontaneous breathing, nonlabored ventilation, respiratory function stable and patient connected to nasal cannula oxygen Cardiovascular status: blood pressure returned to baseline and stable Postop Assessment: no apparent nausea or vomiting Anesthetic complications: no   No notable events documented.   Last Vitals:  Vitals:   07/10/20 0941 07/10/20 0951  BP: (!) 176/72 (!) 165/62  Pulse: 85 82  Resp: (!) 31 17  Temp:    SpO2: 93% 94%    Last Pain:  Vitals:   07/10/20 1110  TempSrc:   PainSc: 0-No pain                 Martha Clan

## 2020-07-10 NOTE — Progress Notes (Signed)
OT Cancellation Note  Patient Details Name: Tina Ingram MRN: [id number] DOB: 12-16-1934   Cancelled Treatment:    Reason Eval/Treat Not Completed: Patient at procedure or test/ unavailable. OT continues to follow pt for ongoing therapy services. Upon attempt this date, pt noted to be OTF for procedure. Will re-attempt at a later time/date as available and pt medically appropriate for OT services.   Shara Blazing, M.S., OTR/L Ascom: [phone] 07/10/20, 10:53 AM

## 2020-07-10 NOTE — Op Note (Signed)
[facility] Patient Name: Tina Ingram Procedure Date: 07/10/2020 8:59 AM MRN: [id number] Account #: [id number] Date of Birth: May 05, 1934 Admit Type: Outpatient Age: 86 Room: [facility] ENDO ROOM 4 Gender: Female Note Status: Finalized Procedure:             Upper GI endoscopy Indications:           Recent gastrointestinal bleeding Providers:             Jonathon Bellows MD, MD Medicines:             Monitored Anesthesia Care Complications:         No immediate complications. Procedure:             Pre-Anesthesia Assessment:                        - Prior to the procedure, a History and Physical was                         performed, and patient medications, allergies and                         sensitivities were reviewed. The patient's tolerance                         of previous anesthesia was reviewed.                        - The risks and benefits of the procedure and the                         sedation options and risks were discussed with the                         patient. All questions were answered and informed                         consent was obtained.                        - ASA Grade Assessment: II - A patient with mild                         systemic disease.                        After obtaining informed consent, the endoscope was                         passed under direct vision. Throughout the procedure,                         the patient's blood pressure, pulse, and oxygen                         saturations were monitored continuously. The Endoscope                         was introduced through the mouth, and advanced to the  third part of duodenum. The upper GI endoscopy was                         accomplished with ease. The patient tolerated the                         procedure well. Findings:      The esophagus was normal.      The stomach was normal.      The examined duodenum was normal.       The examined duodenum was normal. Using the endoscope, the video capsule       enteroscope was advanced into the duodenal bulb.      The cardia and gastric fundus were normal on retroflexion. Impression:            - Normal esophagus.                        - Normal stomach.                        - Normal examined duodenum.                        - Normal examined duodenum.                        - Successful completion of the Video Capsule                         Enteroscope placement.                        - No specimens collected. Recommendation:        - Return patient to hospital ward for ongoing care.                        - NPO for 5 hours.                        - Continue present medications. Procedure Code(s):     --- Professional ---                        [phone], Esophagogastroduodenoscopy, flexible,                         transoral; diagnostic, including collection of                         specimen(s) by brushing or washing, when performed                         (separate procedure) Diagnosis Code(s):     --- Professional ---                        K92.2, Gastrointestinal hemorrhage, unspecified CPT copyright 2019 American Medical Association. All rights reserved. The codes documented in this report are preliminary and upon coder review may  be revised to meet current compliance requirements. Jonathon Bellows, MD Jonathon Bellows MD, MD 07/10/2020 9:18:50 AM This report has been signed electronically. Number of Addenda: 0 Note Initiated On: 07/10/2020  8:59 AM Estimated Blood Loss:  Estimated blood loss: none.      [facility]
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
## 2020-07-10 NOTE — H&P (Signed)
Jonathon Bellows, MD [address], [place], [place], [place], [address] [address], [place], [place], [place], [address] Phone: [phone]  Fax: [phone]  Primary Care Physician:  Derinda Late, MD   Pre-Procedure History & Physical: HPI:  Tina Ingram is a 86 y.o. 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,    Past Surgical History:  Procedure Laterality Date   APPENDECTOMY     BREAST BIOPSY Left 05/16/2014   complex sclerosing lesion    BREAST EXCISIONAL BIOPSY Left 2016   surgical exc to remove complex sclerosing lesion   BREAST LUMPECTOMY WITH RADIOACTIVE SEED LOCALIZATION Left 06/20/2014   Procedure: BREAST LUMPECTOMY WITH RADIOACTIVE SEED LOCALIZATION;  Surgeon: Erroll Luna, MD;  Location: [facility];  Service: General;  Laterality: Left;   CAROTID ENDARTERECTOMY Right    CATARACT EXTRACTION W/PHACO Right 06/25/2015   Procedure: CATARACT EXTRACTION PHACO AND INTRAOCULAR LENS PLACEMENT ([place]);  Surgeon: Estill Cotta, MD;  Location: [facility];  Service: Ophthalmology;  Laterality: Right;  [place] 2.01 AP% 24.5 CDE 52.16 Fluid pack lot # [id number]   COLONOSCOPY     COLONOSCOPY WITH PROPOFOL N/A 07/07/2020   Procedure: COLONOSCOPY WITH PROPOFOL;  Surgeon: Lesly Rubenstein, MD;  Location: [facility] ENDOSCOPY;  Service: Endoscopy;  Laterality: N/A;   ESOPHAGOGASTRODUODENOSCOPY (EGD) WITH PROPOFOL N/A 07/06/2020   Procedure: ESOPHAGOGASTRODUODENOSCOPY (EGD) WITH PROPOFOL;  Surgeon: [name], MD;  Location[facility];  Service: Endoscopy;  Laterality: N/A;   EYE SURGERY     KYPHOPLASTY N/A 01/27/2017   Procedure: [id number];  Surgeon: Hessie Knows, MD;  Location: [facility] OR[facility];  Service: Orthopedics;  Laterality: N/A;  T-9     Prior to  Admission medications   Medication Sig Start Date End Date Taking? Authorizing Provider  acetaminophen (TYLENOL) 500 MG tablet Take 500-1,000 mg by mouth See admin instructions. Take 2 tablets (1000mg ) by mouth every morning and take 1 tablet (500mg ) by mouth every night   Yes [provider]  amLODipine (NORVASC) 2.5 MG tablet Take 2.5 mg by mouth daily.   Yes [provider]  aspirin 81 MG tablet Take 81 mg by mouth every evening.    Yes [provider]  buPROPion (WELLBUTRIN XL) 150 MG 24 hr tablet Take 150 mg by mouth at bedtime.   Yes [provider]  cetirizine (ZYRTEC) 10 MG tablet Take 10 mg by mouth daily.   Yes [provider]  hydrochlorothiazide (HYDRODIURIL) 12.5 MG tablet Take 12.5 mg by mouth daily.   Yes [provider]  melatonin 3 MG TABS tablet Take 3 mg by mouth at bedtime.   Yes [provider]  pantoprazole (PROTONIX) 40 MG tablet Take 40 mg by mouth daily.   Yes [provider]  traZODone (DESYREL) 50 MG tablet Take 50 mg by mouth at bedtime. 12/25/18  Yes [provider]  vitamin B-12 (CYANOCOBALAMIN) 1000 MCG tablet Take 1,000 mcg by mouth daily.   Yes [provider]  mupirocin ointment (BACTROBAN) 2 % Apply 1 application topically daily. Patient not taking: No sig reported 06/21/20   Alfonso Patten, MD    Allergies as of 07/05/2020   (No Known Allergies)    Family History  Problem  Relation Age of Onset   Breast cancer Neg Hx     Social History   Socioeconomic History   Marital status: Divorced    Spouse name: Not on file   Number of children: Not on file   Years of education: Not on file   Highest education level: Not on file  Occupational History   Not on file  Tobacco Use   Smoking status: Every Day    Packs/day: 0.25    Pack years: 0.00    Types: Cigarettes   Smokeless tobacco: Never  Vaping Use   Vaping Use: Never used  Substance and Sexual Activity    Alcohol use: No   Drug use: Never   Sexual activity: Never  Other Topics Concern   Not on file  Social History Narrative   Not on file   Social Determinants of Health   Financial Resource Strain: Not on file  Food Insecurity: Not on file  Transportation Needs: Not on file  Physical Activity: Not on file  Stress: Not on file  Social Connections: Not on file  Intimate Partner Violence: Not on file    Review of Systems: See HPI, otherwise negative ROS  Physical Exam: BP (!) 161/68   Pulse 81   Temp 98.3 F (36.8 C) (Temporal)   Resp 17   Ht 5\' 1"  (1.549 m)   Wt 50 kg   SpO2 93%   BMI 20.83 kg/m  General:   Alert,  pleasant and cooperative in NAD Head:  Normocephalic and atraumatic. Neck:  Supple; no masses or thyromegaly. Lungs:  Clear throughout to auscultation, normal respiratory effort.    Heart:  +S1, +S2, Regular rate and rhythm, No edema. Abdomen:  Soft, nontender and nondistended. Normal bowel sounds, without guarding, and without rebound.   Neurologic:  Alert and  oriented x4;  grossly normal neurologically.  Impression/Plan: Tina Ingram is here for an endoscopy  to be performed for  evaluation of anemia and endoscopic placement of capsule     Risks, benefits, limitations, and alternatives regarding endoscopy have been reviewed with the patient.  Questions have been answered.  All parties agreeable.   Jonathon Bellows, MD  07/10/2020, 8:56 AM

## 2020-07-10 NOTE — Transfer of Care (Signed)
Immediate Anesthesia Transfer of Care Note  Patient: Tina Ingram  Procedure(s) Performed: ESOPHAGOGASTRODUODENOSCOPY (EGD)  Patient Location: PACU  Anesthesia Type:General  Level of Consciousness: awake  Airway & Oxygen Therapy: Patient Spontanous Breathing  Post-op Assessment: Report given to RN and Post -op Vital signs reviewed and stable  Post vital signs: Reviewed  Last Vitals:  Vitals Value Taken Time  BP    Temp    Pulse    Resp    SpO2      Last Pain:  Vitals:   07/10/20 0838  TempSrc: Temporal  PainSc:          Complications: No notable events documented.

## 2020-07-11 ENCOUNTER — Encounter: Payer: Self-pay | Admitting: Gastroenterology

## 2020-07-11 LAB — CBC
HCT: 31.1 % — ABNORMAL LOW (ref 36.0–46.0)
Hemoglobin: 9.3 g/dL — ABNORMAL LOW (ref 12.0–15.0)
MCH: 21 pg — ABNORMAL LOW (ref 26.0–34.0)
MCHC: 29.9 g/dL — ABNORMAL LOW (ref 30.0–36.0)
MCV: 70.2 fL — ABNORMAL LOW (ref 80.0–100.0)
Platelets: 410 10*3/uL — ABNORMAL HIGH (ref 150–400)
RBC: 4.43 MIL/uL (ref 3.87–5.11)
RDW: 29.1 % — ABNORMAL HIGH (ref 11.5–15.5)
WBC: 8.7 10*3/uL (ref 4.0–10.5)
nRBC: 0 % (ref 0.0–0.2)

## 2020-07-11 LAB — FERRITIN: Ferritin: 186 ng/mL (ref 11–307)

## 2020-07-11 LAB — BASIC METABOLIC PANEL
Anion gap: 7 (ref 5–15)
BUN: 11 mg/dL (ref 8–23)
CO2: 25 mmol/L (ref 22–32)
Calcium: 9.4 mg/dL (ref 8.9–10.3)
Chloride: 108 mmol/L (ref 98–111)
Creatinine, Ser: 1.13 mg/dL — ABNORMAL HIGH (ref 0.44–1.00)
GFR, Estimated: 48 mL/min — ABNORMAL LOW (ref >60)
Glucose, Bld: 85 mg/dL (ref 70–99)
Potassium: 3.6 mmol/L (ref 3.5–5.1)
Sodium: 140 mmol/L (ref 135–145)

## 2020-07-11 NOTE — Progress Notes (Signed)
Mobility Specialist - Progress Note   07/11/20 1400  Mobility  Activity Refused mobility  Mobility performed by Mobility specialist    Pt requested for mobility specialist to return this afternoon for 2nd ambulation this date. Upon entering room, pt lying in bed politely declining OOB activity. States she just finished getting a bath and would like to rest at this time.    Kathee Delton Mobility Specialist 07/11/20, 2:55 PM
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
## 2020-07-11 NOTE — Progress Notes (Addendum)
Mobility Specialist - Progress Note   07/11/20 1100  Mobility  Activity Ambulated in hall  Level of Assistance Minimal assist, patient does 75% or more  Assistive Device [employer] Ambulated (ft) 330 ft  Mobility Out of bed for toileting;Ambulated with assistance in hallway  Mobility Response Tolerated well  Mobility performed by Mobility specialist  $Mobility charge 1 Mobility   During mobility: 111 HR, 86% SpO2 Post-mobility: 83 HR, 95% SpO2   Pt motivated for session. Pt requesting to use BSC prior to ambulation in hallway. HHA to stand with SPC. Transferred to [facility] for urinal output. Supervision for peri-care. Pt ambulated 2 laps around nursing station, no LOB. Denied SOB throughout session, however O2 desat to 86% via pulse ox. No heavy/labored breathing noted. Pt returned to recliner with alarm set, motivated to ambulate again later this afternoon. Will attempt session as time permits.   Pt does voice that she does not feel safe to d/c d/t lack of assist at home. Per chart review, pt has been set up with [facility] assistance.  Kathee Delton Mobility Specialist 07/11/20, 11:28 AM
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
## 2020-07-12 ENCOUNTER — Telehealth: Payer: Self-pay

## 2020-07-12 NOTE — Care Management Important Message (Signed)
Important Message  Patient Details  Name: Tina Ingram MRN: [id number] Date of Birth: [birth year]   Medicare Important Message Given:  No  Patient discharged prior to arrival to unit to deliver concurrent Medicare IM.   Dannette Barbara 07/12/2020, 12:22 PM

## 2020-07-12 NOTE — Progress Notes (Signed)
GI note  Capsule study of the small bowel shows no abnormal lesions and no evidence of active bleeding.   No further GI input , if has further bleeding in the furture consider tagged RBC scan   I will sign off.  Please call me if any further GI concerns or questions.  We would like to thank you for the opportunity to participate in the care of Tina Ingram.   Dr Jonathon Bellows MD,MRCP [facility]) Gastroenterology/Hepatology Pager: [phone]

## 2020-07-12 NOTE — Progress Notes (Signed)
Mobility Specialist - Progress Note   07/12/20 1100  Mobility  Activity Ambulated to bathroom  Range of Motion/Exercises Active  Level of Assistance Minimal assist, patient does 75% or more  Assistive Device [employer] Ambulated (ft) 15 ft  Mobility Out of bed for toileting  Mobility Response Tolerated well  Mobility performed by Mobility specialist  $Mobility charge 1 Mobility    Pt ambulating from bathroom with NT upon arrival. Pt sat EOB and engaged in LB/UB dressing. MinA for donning pants while coming upright. Pt tolerated well. Anticipating d/c this date.     Kathee Delton Mobility Specialist 07/12/20, 12:00 PM
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
## 2020-07-12 NOTE — Telephone Encounter (Signed)
Hospital called to schedule patient a hospital follow up with Dr. Allen Norris. Called and left a message for call back to schedule follow up appointment

## 2020-07-18 DIAGNOSIS — F331 Major depressive disorder, recurrent, moderate: Secondary | ICD-10-CM | POA: Diagnosis not present

## 2020-07-18 DIAGNOSIS — D62 Acute posthemorrhagic anemia: Secondary | ICD-10-CM | POA: Diagnosis not present

## 2020-07-18 DIAGNOSIS — R5381 Other malaise: Secondary | ICD-10-CM | POA: Diagnosis not present

## 2020-07-18 DIAGNOSIS — R531 Weakness: Secondary | ICD-10-CM | POA: Diagnosis not present

## 2020-07-18 DIAGNOSIS — N1831 Chronic kidney disease, stage 3a: Secondary | ICD-10-CM | POA: Diagnosis not present

## 2020-07-22 DIAGNOSIS — M545 Low back pain, unspecified: Secondary | ICD-10-CM | POA: Diagnosis not present

## 2020-07-22 DIAGNOSIS — Z9049 Acquired absence of other specified parts of digestive tract: Secondary | ICD-10-CM | POA: Diagnosis not present

## 2020-07-22 DIAGNOSIS — I6529 Occlusion and stenosis of unspecified carotid artery: Secondary | ICD-10-CM | POA: Diagnosis not present

## 2020-07-22 DIAGNOSIS — D62 Acute posthemorrhagic anemia: Secondary | ICD-10-CM | POA: Diagnosis not present

## 2020-07-22 DIAGNOSIS — Z85828 Personal history of other malignant neoplasm of skin: Secondary | ICD-10-CM | POA: Diagnosis not present

## 2020-07-22 DIAGNOSIS — E559 Vitamin D deficiency, unspecified: Secondary | ICD-10-CM | POA: Diagnosis not present

## 2020-07-22 DIAGNOSIS — D500 Iron deficiency anemia secondary to blood loss (chronic): Secondary | ICD-10-CM | POA: Diagnosis not present

## 2020-07-22 DIAGNOSIS — M810 Age-related osteoporosis without current pathological fracture: Secondary | ICD-10-CM | POA: Diagnosis not present

## 2020-07-22 DIAGNOSIS — N183 Chronic kidney disease, stage 3 unspecified: Secondary | ICD-10-CM | POA: Diagnosis not present

## 2020-07-22 DIAGNOSIS — F329 Major depressive disorder, single episode, unspecified: Secondary | ICD-10-CM | POA: Diagnosis not present

## 2020-07-22 DIAGNOSIS — F17210 Nicotine dependence, cigarettes, uncomplicated: Secondary | ICD-10-CM | POA: Diagnosis not present

## 2020-07-22 DIAGNOSIS — Z86010 Personal history of colonic polyps: Secondary | ICD-10-CM | POA: Diagnosis not present

## 2020-07-22 DIAGNOSIS — Z86008 Personal history of in-situ neoplasm of other site: Secondary | ICD-10-CM | POA: Diagnosis not present

## 2020-07-22 DIAGNOSIS — I1310 Hypertensive heart and chronic kidney disease without heart failure, with stage 1 through stage 4 chronic kidney disease, or unspecified chronic kidney disease: Secondary | ICD-10-CM | POA: Diagnosis not present

## 2020-07-22 DIAGNOSIS — G8929 Other chronic pain: Secondary | ICD-10-CM | POA: Diagnosis not present

## 2020-07-26 DIAGNOSIS — E559 Vitamin D deficiency, unspecified: Secondary | ICD-10-CM | POA: Diagnosis not present

## 2020-07-26 DIAGNOSIS — M810 Age-related osteoporosis without current pathological fracture: Secondary | ICD-10-CM | POA: Diagnosis not present

## 2020-07-26 DIAGNOSIS — I1310 Hypertensive heart and chronic kidney disease without heart failure, with stage 1 through stage 4 chronic kidney disease, or unspecified chronic kidney disease: Secondary | ICD-10-CM | POA: Diagnosis not present

## 2020-07-26 DIAGNOSIS — F17210 Nicotine dependence, cigarettes, uncomplicated: Secondary | ICD-10-CM | POA: Diagnosis not present

## 2020-07-26 DIAGNOSIS — D500 Iron deficiency anemia secondary to blood loss (chronic): Secondary | ICD-10-CM | POA: Diagnosis not present

## 2020-07-26 DIAGNOSIS — F329 Major depressive disorder, single episode, unspecified: Secondary | ICD-10-CM | POA: Diagnosis not present

## 2020-07-26 DIAGNOSIS — Z85828 Personal history of other malignant neoplasm of skin: Secondary | ICD-10-CM | POA: Diagnosis not present

## 2020-07-26 DIAGNOSIS — D62 Acute posthemorrhagic anemia: Secondary | ICD-10-CM | POA: Diagnosis not present

## 2020-07-26 DIAGNOSIS — Z86010 Personal history of colonic polyps: Secondary | ICD-10-CM | POA: Diagnosis not present

## 2020-07-26 DIAGNOSIS — Z9049 Acquired absence of other specified parts of digestive tract: Secondary | ICD-10-CM | POA: Diagnosis not present

## 2020-07-26 DIAGNOSIS — M545 Low back pain, unspecified: Secondary | ICD-10-CM | POA: Diagnosis not present

## 2020-07-26 DIAGNOSIS — I6529 Occlusion and stenosis of unspecified carotid artery: Secondary | ICD-10-CM | POA: Diagnosis not present

## 2020-07-26 DIAGNOSIS — G8929 Other chronic pain: Secondary | ICD-10-CM | POA: Diagnosis not present

## 2020-07-26 DIAGNOSIS — Z86008 Personal history of in-situ neoplasm of other site: Secondary | ICD-10-CM | POA: Diagnosis not present

## 2020-07-26 DIAGNOSIS — N183 Chronic kidney disease, stage 3 unspecified: Secondary | ICD-10-CM | POA: Diagnosis not present

## 2020-07-30 DIAGNOSIS — M545 Low back pain, unspecified: Secondary | ICD-10-CM | POA: Diagnosis not present

## 2020-07-30 DIAGNOSIS — N183 Chronic kidney disease, stage 3 unspecified: Secondary | ICD-10-CM | POA: Diagnosis not present

## 2020-07-30 DIAGNOSIS — D509 Iron deficiency anemia, unspecified: Secondary | ICD-10-CM | POA: Diagnosis not present

## 2020-07-30 DIAGNOSIS — I129 Hypertensive chronic kidney disease with stage 1 through stage 4 chronic kidney disease, or unspecified chronic kidney disease: Secondary | ICD-10-CM | POA: Diagnosis not present

## 2020-07-30 DIAGNOSIS — G8929 Other chronic pain: Secondary | ICD-10-CM | POA: Diagnosis not present

## 2020-07-30 DIAGNOSIS — D62 Acute posthemorrhagic anemia: Secondary | ICD-10-CM | POA: Diagnosis not present

## 2020-07-30 DIAGNOSIS — M810 Age-related osteoporosis without current pathological fracture: Secondary | ICD-10-CM | POA: Diagnosis not present

## 2020-08-02 DIAGNOSIS — D500 Iron deficiency anemia secondary to blood loss (chronic): Secondary | ICD-10-CM | POA: Diagnosis not present

## 2020-08-02 DIAGNOSIS — F17210 Nicotine dependence, cigarettes, uncomplicated: Secondary | ICD-10-CM | POA: Diagnosis not present

## 2020-08-02 DIAGNOSIS — F329 Major depressive disorder, single episode, unspecified: Secondary | ICD-10-CM | POA: Diagnosis not present

## 2020-08-02 DIAGNOSIS — M810 Age-related osteoporosis without current pathological fracture: Secondary | ICD-10-CM | POA: Diagnosis not present

## 2020-08-02 DIAGNOSIS — Z85828 Personal history of other malignant neoplasm of skin: Secondary | ICD-10-CM | POA: Diagnosis not present

## 2020-08-02 DIAGNOSIS — N183 Chronic kidney disease, stage 3 unspecified: Secondary | ICD-10-CM | POA: Diagnosis not present

## 2020-08-02 DIAGNOSIS — D62 Acute posthemorrhagic anemia: Secondary | ICD-10-CM | POA: Diagnosis not present

## 2020-08-02 DIAGNOSIS — E559 Vitamin D deficiency, unspecified: Secondary | ICD-10-CM | POA: Diagnosis not present

## 2020-08-02 DIAGNOSIS — M545 Low back pain, unspecified: Secondary | ICD-10-CM | POA: Diagnosis not present

## 2020-08-02 DIAGNOSIS — I1310 Hypertensive heart and chronic kidney disease without heart failure, with stage 1 through stage 4 chronic kidney disease, or unspecified chronic kidney disease: Secondary | ICD-10-CM | POA: Diagnosis not present

## 2020-08-02 DIAGNOSIS — G8929 Other chronic pain: Secondary | ICD-10-CM | POA: Diagnosis not present

## 2020-08-02 DIAGNOSIS — Z9049 Acquired absence of other specified parts of digestive tract: Secondary | ICD-10-CM | POA: Diagnosis not present

## 2020-08-02 DIAGNOSIS — Z86008 Personal history of in-situ neoplasm of other site: Secondary | ICD-10-CM | POA: Diagnosis not present

## 2020-08-02 DIAGNOSIS — I6529 Occlusion and stenosis of unspecified carotid artery: Secondary | ICD-10-CM | POA: Diagnosis not present

## 2020-08-02 DIAGNOSIS — Z86010 Personal history of colonic polyps: Secondary | ICD-10-CM | POA: Diagnosis not present

## 2020-08-13 DIAGNOSIS — F329 Major depressive disorder, single episode, unspecified: Secondary | ICD-10-CM | POA: Diagnosis not present

## 2020-08-13 DIAGNOSIS — D500 Iron deficiency anemia secondary to blood loss (chronic): Secondary | ICD-10-CM | POA: Diagnosis not present

## 2020-08-13 DIAGNOSIS — G8929 Other chronic pain: Secondary | ICD-10-CM | POA: Diagnosis not present

## 2020-08-13 DIAGNOSIS — Z9181 History of falling: Secondary | ICD-10-CM | POA: Diagnosis not present

## 2020-08-13 DIAGNOSIS — K921 Melena: Secondary | ICD-10-CM | POA: Diagnosis not present

## 2020-08-13 DIAGNOSIS — I1310 Hypertensive heart and chronic kidney disease without heart failure, with stage 1 through stage 4 chronic kidney disease, or unspecified chronic kidney disease: Secondary | ICD-10-CM | POA: Diagnosis not present

## 2020-08-13 DIAGNOSIS — M810 Age-related osteoporosis without current pathological fracture: Secondary | ICD-10-CM | POA: Diagnosis not present

## 2020-08-13 DIAGNOSIS — Z86008 Personal history of in-situ neoplasm of other site: Secondary | ICD-10-CM | POA: Diagnosis not present

## 2020-08-13 DIAGNOSIS — Z9841 Cataract extraction status, right eye: Secondary | ICD-10-CM | POA: Diagnosis not present

## 2020-08-13 DIAGNOSIS — D62 Acute posthemorrhagic anemia: Secondary | ICD-10-CM | POA: Diagnosis not present

## 2020-08-13 DIAGNOSIS — Z86010 Personal history of colonic polyps: Secondary | ICD-10-CM | POA: Diagnosis not present

## 2020-08-13 DIAGNOSIS — Z9049 Acquired absence of other specified parts of digestive tract: Secondary | ICD-10-CM | POA: Diagnosis not present

## 2020-08-13 DIAGNOSIS — Z961 Presence of intraocular lens: Secondary | ICD-10-CM | POA: Diagnosis not present

## 2020-08-13 DIAGNOSIS — K640 First degree hemorrhoids: Secondary | ICD-10-CM | POA: Diagnosis not present

## 2020-08-13 DIAGNOSIS — M545 Low back pain, unspecified: Secondary | ICD-10-CM | POA: Diagnosis not present

## 2020-08-13 DIAGNOSIS — F17210 Nicotine dependence, cigarettes, uncomplicated: Secondary | ICD-10-CM | POA: Diagnosis not present

## 2020-08-13 DIAGNOSIS — E559 Vitamin D deficiency, unspecified: Secondary | ICD-10-CM | POA: Diagnosis not present

## 2020-08-13 DIAGNOSIS — I6529 Occlusion and stenosis of unspecified carotid artery: Secondary | ICD-10-CM | POA: Diagnosis not present

## 2020-08-13 DIAGNOSIS — N183 Chronic kidney disease, stage 3 unspecified: Secondary | ICD-10-CM | POA: Diagnosis not present

## 2020-08-13 DIAGNOSIS — Z85828 Personal history of other malignant neoplasm of skin: Secondary | ICD-10-CM | POA: Diagnosis not present

## 2020-08-21 ENCOUNTER — Ambulatory Visit: Payer: PPO | Admitting: Dermatology

## 2020-08-21 DIAGNOSIS — D044 Carcinoma in situ of skin of scalp and neck: Secondary | ICD-10-CM | POA: Diagnosis not present

## 2020-08-22 ENCOUNTER — Ambulatory Visit (INDEPENDENT_AMBULATORY_CARE_PROVIDER_SITE_OTHER): Payer: PPO | Admitting: Surgical

## 2020-08-22 ENCOUNTER — Other Ambulatory Visit: Payer: Self-pay

## 2020-08-22 ENCOUNTER — Encounter: Payer: Self-pay | Admitting: Surgical

## 2020-08-22 VITALS — BP 139/77 | HR 95 | Ht 65.0 in | Wt 108.0 lb

## 2020-08-22 DIAGNOSIS — M952 Other acquired deformity of head: Secondary | ICD-10-CM

## 2020-08-22 DIAGNOSIS — Z9889 Other specified postprocedural states: Secondary | ICD-10-CM

## 2020-08-22 MED ORDER — ONDANSETRON HCL 4 MG PO TABS: 4.0000 mg | ORAL_TABLET | Freq: Three times a day (TID) | ORAL | 0 refills | Status: DC | PRN

## 2020-08-22 MED ORDER — TRAMADOL HCL 50 MG PO TABS: 50.0000 mg | ORAL_TABLET | Freq: Three times a day (TID) | ORAL | 0 refills | Status: AC | PRN

## 2020-08-22 MED ORDER — CEPHALEXIN 500 MG PO CAPS: 500.0000 mg | ORAL_CAPSULE | Freq: Four times a day (QID) | ORAL | 0 refills | Status: AC

## 2020-08-22 NOTE — Progress Notes (Signed)
Patient ID: JACOB WOLAK, female    DOB: 09-Aug-1934, 86 y.o.   MRN: [id number]  Chief Complaint  Patient presents with   Pre-op Exam      ICD-10-CM   1. Mohs defect of scalp  M95.2    Z98.890      History of Present Illness: Carmelle Mueth Cerf is a 86 y.o.  female  with a history of recent Mohs excision on 08/21/2020.  She presents for preoperative evaluation for upcoming procedure, possible application of wound matrix to scalp, possible flap to scalp, possible skin graft, scheduled for 08/29/2020 with Dr. Marla Roe.  The patient has not had problems with anesthesia. No history of DVT/PE.  No family history of DVT/PE.  No family or personal history of bleeding or clotting disorders.  Patient is not currently taking any blood thinners.  No history of CVA/MI.   PMH Significant for: Basal cell carcinoma, hypertension, smoker, chronic kidney disease, generalized weakness, anemia with most recent hemoglobin 9.3 on 07/11/2020.  Patient did have recent blood transfusion at at the end of June for anemia.  She reports that after this injection she is feeling much better, feels at baseline.  Denies any vision changes, generalized weakness, or any other recent changes in her health.  She presents today with her sister.  She had Mohs excision of her scalp skin cancer on 08/21/2020.   Past Medical History: Allergies: No Known Allergies  Current Medications:  Current Outpatient Medications:    acetaminophen (TYLENOL) 500 MG tablet, Take 500-1,000 mg by mouth See admin instructions. Take 2 tablets ('1000mg'$ ) by mouth every morning and take 1 tablet ('500mg'$ ) by mouth every night, Disp: , Rfl:    amLODipine (NORVASC) 2.5 MG tablet, Take 2.5 mg by mouth daily., Disp: , Rfl:    buPROPion (WELLBUTRIN XL) 150 MG 24 hr tablet, Take 150 mg by mouth at bedtime., Disp: , Rfl:    cetirizine (ZYRTEC) 10 MG tablet, Take 10 mg by mouth daily., Disp: , Rfl:    hydrochlorothiazide (HYDRODIURIL) 12.5 MG tablet, Take  12.5 mg by mouth daily., Disp: , Rfl:    melatonin 3 MG TABS tablet, Take 3 mg by mouth at bedtime., Disp: , Rfl:    pantoprazole (PROTONIX) 40 MG tablet, Take 40 mg by mouth daily., Disp: , Rfl:    traZODone (DESYREL) 50 MG tablet, Take 50 mg by mouth at bedtime., Disp: , Rfl:    vitamin B-12 (CYANOCOBALAMIN) 1000 MCG tablet, Take 1,000 mcg by mouth daily., Disp: , Rfl:    buPROPion (WELLBUTRIN XL) 300 MG 24 hr tablet, Take 300 mg by mouth daily as needed., Disp: , Rfl:    tiZANidine (ZANAFLEX) 2 MG tablet, Take 2 mg by mouth 3 (three) times daily., Disp: , Rfl:   Past Medical Problems: 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 scalp, nasal tip    Past Surgical History: Past Surgical History:  Procedure Laterality Date   APPENDECTOMY     BREAST BIOPSY Left 05/16/2014   complex sclerosing lesion    BREAST EXCISIONAL BIOPSY Left 2016   surgical exc to remove complex sclerosing lesion   BREAST LUMPECTOMY WITH RADIOACTIVE SEED LOCALIZATION Left 06/20/2014   Procedure: BREAST LUMPECTOMY WITH RADIOACTIVE SEED LOCALIZATION;  Surgeon: Erroll Luna, MD;  Location: [facility];  Service: General;  Laterality: Left;   CAROTID ENDARTERECTOMY Right    CATARACT EXTRACTION W/PHACO Right 06/25/2015   Procedure: CATARACT EXTRACTION PHACO AND INTRAOCULAR LENS PLACEMENT (IOC);  Surgeon: Estill Cotta, MD;  Location: [facility];  Service: Ophthalmology;  Laterality: Right;  [place] 2.01 AP% 24.5 CDE 52.16 Fluid pack lot # [id number]   COLONOSCOPY     COLONOSCOPY WITH PROPOFOL N/A 07/07/2020   Procedure: COLONOSCOPY WITH PROPOFOL;  Surgeon: Lesly Rubenstein, MD;  Location: [facility];  Service: Endoscopy;  Laterality: N/A;   ESOPHAGOGASTRODUODENOSCOPY N/A 07/10/2020   Procedure: ESOPHAGOGASTRODUODENOSCOPY (EGD);  Surgeon:  Jonathon Bellows, MD;  Location: [facility];  Service: Gastroenterology;  Laterality: N/A;   ESOPHAGOGASTRODUODENOSCOPY (EGD) WITH PROPOFOL N/A 07/06/2020   Procedure: ESOPHAGOGASTRODUODENOSCOPY (EGD) WITH PROPOFOL;  Surgeon: [name], MD;  Location: [facility];  Service: Endoscopy;  Laterality: N/A;   EYE SURGERY     KYPHOPLASTY N/A 01/27/2017   Procedure: [id number];  Surgeon: Hessie Knows, MD;  Location: [facility];  Service: Orthopedics;  Laterality: N/A;  T-9     Social History: Social History   Socioeconomic History   Marital status: Divorced    Spouse name: Not on file   Number of children: Not on file   Years of education: Not on file   Highest education level: Not on file  Occupational History   Not on file  Tobacco Use   Smoking status: Every Day    Packs/day: 0.25    Types: Cigarettes   Smokeless tobacco: Never  Vaping Use   Vaping Use: Never used  Substance and Sexual Activity   Alcohol use: No   Drug use: Never   Sexual activity: Never  Other Topics Concern   Not on file  Social History Narrative   Not on file   Social Determinants of Health   Financial Resource Strain: Not on file  Food Insecurity: Not on file  Transportation Needs: Not on file  Physical Activity: Not on file  Stress: Not on file  Social Connections: Not on file  Intimate Partner Violence: Not on file    Family History: Family History  Problem Relation Age of Onset   Breast cancer Neg Hx     Review of Systems: Review of Systems  Constitutional:  Negative for chills, diaphoresis, fever, malaise/fatigue and weight loss.  Eyes: Negative.   Respiratory: Negative.    Cardiovascular: Negative.   Neurological: Negative.    Physical Exam: Vital Signs BP 139/77 (BP Location: Left Arm, Patient Position: Sitting, Cuff Size: Normal)   Pulse 95   Ht '5\' 5"'$  (1.651 m)   Wt 108 lb (49 kg)   SpO2 94%   BMI 17.97 kg/m   Physical Exam  Constitutional:      General: Not in  acute distress.    Appearance: Normal appearance. Not ill-appearing.  HENT:     Head: Large Mohs scalp defect noted, wound is approximately 5 x 3 cm.  Depth is approximately 0.15 cm.   Eyes:     Pupils: Pupils are equal, round Neck:     Musculoskeletal: Normal range of motion.  Cardiovascular:     Rate and Rhythm: Normal rate    Pulses: Normal pulses.  Pulmonary:     Effort: Pulmonary effort is normal. No respiratory distress.  Abdominal:     General: Abdomen is flat. There is no distension.  Musculoskeletal: Normal range of motion.  Skin:    General: Skin is warm and dry.  Findings: No erythema or rash.  Neurological:     General: No focal deficit present.     Mental Status: Alert and oriented to person, place, and time. Mental status is at baseline.     Motor: No weakness.  Psychiatric:        Mood and Affect: Mood normal.        Behavior: Behavior normal.    Assessment/Plan: The patient is scheduled for possible application of wound matrix itself, possible flap, possible skin graft with Dr. Marla Roe.  Risks, benefits, and alternatives of procedure discussed, questions answered and consent obtained.    Smoking Status: Current everyday smoker, 1 pack/day; Counseling Given?  Discussed with patient increased risk of postoperative complications, delayed healing  Caprini Score: 5, high; Risk Factors include: Age, current every day smoker and length of planned surgery. Recommendation for mechanical and prophylaxis. Encourage early ambulation.   Pictures obtained: '@consult'$   Post-op Rx sent to pharmacy:  Tramadol, Zofran, Norco  Patient was provided with the General Surgical Risk consent document and Pain Medication Agreement prior to their appointment.  They had adequate time to read through the risk consent documents and Pain Medication Agreement. We also discussed them in person together during this preop appointment. All of their questions were answered to their  satisfaction.  Recommended calling if they have any further questions.  Risk consent form and Pain Medication Agreement to be scanned into patient's chart.  The risks that can be encountered with and after a skin graft were discussed and include the following but not limited to these: bleeding, infection, delayed healing, anesthesia risks, skin sensation changes, injury to structures including nerves, blood vessels, and muscles which may be temporary or permanent, allergies to tape, suture materials and glues, blood products, topical preparations or injected agents, skin contour irregularities, skin discoloration and swelling, deep vein thrombosis, cardiac and pulmonary complications, pain, which may persist, failure of the graft and possible need for revisional surgery or staged procedures.  Patient and sister are understanding of surgical plan.  All of their questions were answered in regards to wound care.  We will send some recommendations for home wound care assistance from [facility] home health.  She is currently receiving assistance at home from [facility] for physical therapy.  In regards to wound care for today, donated ACell was applied, this was covered with Adaptic, K-Y jelly 4 x 4 gauze, Medipore tape and a Kerlix was wrapped around patient's head to secure.  Recommend application of K-Y jelly to Adaptic every other day, recommend covering this with 4 x 4 gauze, Medipore tape and securing in place with a Kerlix wrap or other garment such as a beanie or cap.  Recommend changing Adaptic every 3 days.    Electronically signed by: Carola Rhine Merita Hawks, PA-C 08/22/2020 4:46 PM

## 2020-08-22 NOTE — H&P (View-Only) (Signed)
Patient ID: Tina Ingram, female    DOB: 01-Jan-1935, 86 y.o.   MRN: [id number]  Chief Complaint  Patient presents with   Pre-op Exam      ICD-10-CM   1. Mohs defect of scalp  M95.2    Z98.890      History of Present Illness: Tina Ingram is a 86 y.o.  female  with a history of recent Mohs excision on 08/21/2020.  She presents for preoperative evaluation for upcoming procedure, possible application of wound matrix to scalp, possible flap to scalp, possible skin graft, scheduled for 08/29/2020 with Dr. Marla Roe.  The patient has not had problems with anesthesia. No history of DVT/PE.  No family history of DVT/PE.  No family or personal history of bleeding or clotting disorders.  Patient is not currently taking any blood thinners.  No history of CVA/MI.   PMH Significant for: Basal cell carcinoma, hypertension, smoker, chronic kidney disease, generalized weakness, anemia with most recent hemoglobin 9.3 on 07/11/2020.  Patient did have recent blood transfusion at at the end of June for anemia.  She reports that after this injection she is feeling much better, feels at baseline.  Denies any vision changes, generalized weakness, or any other recent changes in her health.  She presents today with her sister.  She had Mohs excision of her scalp skin cancer on 08/21/2020.   Past Medical History: Allergies: No Known Allergies  Current Medications:  Current Outpatient Medications:    acetaminophen (TYLENOL) 500 MG tablet, Take 500-1,000 mg by mouth See admin instructions. Take 2 tablets ('1000mg'$ ) by mouth every morning and take 1 tablet ('500mg'$ ) by mouth every night, Disp: , Rfl:    amLODipine (NORVASC) 2.5 MG tablet, Take 2.5 mg by mouth daily., Disp: , Rfl:    buPROPion (WELLBUTRIN XL) 150 MG 24 hr tablet, Take 150 mg by mouth at bedtime., Disp: , Rfl:    cetirizine (ZYRTEC) 10 MG tablet, Take 10 mg by mouth daily., Disp: , Rfl:    hydrochlorothiazide (HYDRODIURIL) 12.5 MG tablet, Take  12.5 mg by mouth daily., Disp: , Rfl:    melatonin 3 MG TABS tablet, Take 3 mg by mouth at bedtime., Disp: , Rfl:    pantoprazole (PROTONIX) 40 MG tablet, Take 40 mg by mouth daily., Disp: , Rfl:    traZODone (DESYREL) 50 MG tablet, Take 50 mg by mouth at bedtime., Disp: , Rfl:    vitamin B-12 (CYANOCOBALAMIN) 1000 MCG tablet, Take 1,000 mcg by mouth daily., Disp: , Rfl:    buPROPion (WELLBUTRIN XL) 300 MG 24 hr tablet, Take 300 mg by mouth daily as needed., Disp: , Rfl:    tiZANidine (ZANAFLEX) 2 MG tablet, Take 2 mg by mouth 3 (three) times daily., Disp: , Rfl:   Past Medical Problems: 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 scalp, nasal tip    Past Surgical History: Past Surgical History:  Procedure Laterality Date   APPENDECTOMY     BREAST BIOPSY Left 05/16/2014   complex sclerosing lesion    BREAST EXCISIONAL BIOPSY Left 2016   surgical exc to remove complex sclerosing lesion   BREAST LUMPECTOMY WITH RADIOACTIVE SEED LOCALIZATION Left 06/20/2014   Procedure: BREAST LUMPECTOMY WITH RADIOACTIVE SEED LOCALIZATION;  Surgeon: Erroll Luna, MD;  Location: [facility];  Service: General;  Laterality: Left;   CAROTID ENDARTERECTOMY Right    CATARACT EXTRACTION W/PHACO Right 06/25/2015   Procedure: CATARACT EXTRACTION PHACO AND INTRAOCULAR LENS PLACEMENT (IOC);  Surgeon: Estill Cotta, MD;  Location: [facility];  Service: Ophthalmology;  Laterality: Right;  [place] 2.01 AP% 24.5 CDE 52.16 Fluid pack lot # [id number]   COLONOSCOPY     COLONOSCOPY WITH PROPOFOL N/A 07/07/2020   Procedure: COLONOSCOPY WITH PROPOFOL;  Surgeon: Lesly Rubenstein, MD;  Location: [facility];  Service: Endoscopy;  Laterality: N/A;   ESOPHAGOGASTRODUODENOSCOPY N/A 07/10/2020   Procedure: ESOPHAGOGASTRODUODENOSCOPY (EGD);  Surgeon:  Jonathon Bellows, MD;  Location: [facility]COPY;  Service: Gastroenterology;  Laterality: N/A;   ESOPHAGOGASTRODUODENOSCOPY (EGD) WITH PROPOFOL N/A 07/06/2020   Procedure: ESOPHAGOGASTRODUODENOSCOPY (EGD) WITH PROPOFOL;  Surgeon: [name], MD;  Location: [facility] ENDOSCOPY;  Service: Endoscopy;  Laterality: N/A;   EYE SURGERY     KYPHOPLASTY N/A 01/27/2017   Procedure: [id number];  Surgeon: Hessie Knows, MD;  Location: [facility];  Service: Orthopedics;  Laterality: N/A;  T-9     Social History: Social History   Socioeconomic History   Marital status: Divorced    Spouse name: Not on file   Number of children: Not on file   Years of education: Not on file   Highest education level: Not on file  Occupational History   Not on file  Tobacco Use   Smoking status: Every Day    Packs/day: 0.25    Types: Cigarettes   Smokeless tobacco: Never  Vaping Use   Vaping Use: Never used  Substance and Sexual Activity   Alcohol use: No   Drug use: Never   Sexual activity: Never  Other Topics Concern   Not on file  Social History Narrative   Not on file   Social Determinants of Health   Financial Resource Strain: Not on file  Food Insecurity: Not on file  Transportation Needs: Not on file  Physical Activity: Not on file  Stress: Not on file  Social Connections: Not on file  Intimate Partner Violence: Not on file    Family History: Family History  Problem Relation Age of Onset   Breast cancer Neg Hx     Review of Systems: Review of Systems  Constitutional:  Negative for chills, diaphoresis, fever, malaise/fatigue and weight loss.  Eyes: Negative.   Respiratory: Negative.    Cardiovascular: Negative.   Neurological: Negative.    Physical Exam: Vital Signs BP 139/77 (BP Location: Left Arm, Patient Position: Sitting, Cuff Size: Normal)   Pulse 95   Ht '5\' 5"'$  (1.651 m)   Wt 108 lb (49 kg)   SpO2 94%   BMI 17.97 kg/m   Physical Exam  Constitutional:      General: Not in  acute distress.    Appearance: Normal appearance. Not ill-appearing.  HENT:     Head: Large Mohs scalp defect noted, wound is approximately 5 x 3 cm.  Depth is approximately 0.15 cm.   Eyes:     Pupils: Pupils are equal, round Neck:     Musculoskeletal: Normal range of motion.  Cardiovascular:     Rate and Rhythm: Normal rate    Pulses: Normal pulses.  Pulmonary:     Effort: Pulmonary effort is normal. No respiratory distress.  Abdominal:     General: Abdomen is flat. There is no distension.  Musculoskeletal: Normal range of motion.  Skin:    General: Skin is warm and dry.  Findings: No erythema or rash.  Neurological:     General: No focal deficit present.     Mental Status: Alert and oriented to person, place, and time. Mental status is at baseline.     Motor: No weakness.  Psychiatric:        Mood and Affect: Mood normal.        Behavior: Behavior normal.    Assessment/Plan: The patient is scheduled for possible application of wound matrix itself, possible flap, possible skin graft with Dr. Marla Roe.  Risks, benefits, and alternatives of procedure discussed, questions answered and consent obtained.    Smoking Status: Current everyday smoker, 1 pack/day; Counseling Given?  Discussed with patient increased risk of postoperative complications, delayed healing  Caprini Score: 5, high; Risk Factors include: Age, current every day smoker and length of planned surgery. Recommendation for mechanical and prophylaxis. Encourage early ambulation.   Pictures obtained: '@consult'$   Post-op Rx sent to pharmacy:  Tramadol, Zofran, Norco  Patient was provided with the General Surgical Risk consent document and Pain Medication Agreement prior to their appointment.  They had adequate time to read through the risk consent documents and Pain Medication Agreement. We also discussed them in person together during this preop appointment. All of their questions were answered to their  satisfaction.  Recommended calling if they have any further questions.  Risk consent form and Pain Medication Agreement to be scanned into patient's chart.  The risks that can be encountered with and after a skin graft were discussed and include the following but not limited to these: bleeding, infection, delayed healing, anesthesia risks, skin sensation changes, injury to structures including nerves, blood vessels, and muscles which may be temporary or permanent, allergies to tape, suture materials and glues, blood products, topical preparations or injected agents, skin contour irregularities, skin discoloration and swelling, deep vein thrombosis, cardiac and pulmonary complications, pain, which may persist, failure of the graft and possible need for revisional surgery or staged procedures.  Patient and sister are understanding of surgical plan.  All of their questions were answered in regards to wound care.  We will send some recommendations for home wound care assistance from [facility] home health.  She is currently receiving assistance at home from [facility] for physical therapy.  In regards to wound care for today, donated ACell was applied, this was covered with Adaptic, K-Y jelly 4 x 4 gauze, Medipore tape and a Kerlix was wrapped around patient's head to secure.  Recommend application of K-Y jelly to Adaptic every other day, recommend covering this with 4 x 4 gauze, Medipore tape and securing in place with a Kerlix wrap or other garment such as a beanie or cap.  Recommend changing Adaptic every 3 days.    Electronically signed by: Carola Rhine Ken Bonn, PA-C 08/22/2020 4:46 PM

## 2020-08-23 ENCOUNTER — Telehealth: Payer: Self-pay | Admitting: Plastic Surgery

## 2020-08-23 NOTE — Telephone Encounter (Signed)
Patient's sister called regarding the surgery that was scheduled for 8/17. Ms. Commerford was present during the phone conversation. The sister has a trip planned from 8/13-8/20 and is unable to bring the patient to surgery. They would like to postpone the surgery until a later time when she will have transportation. I called and spoke with Verdie Shire, who advised to have the patient follow up in the office next week, if possible. We will reassess the scalp defect and plan accordingly. In the interim, the clinic staff will arrange home health for the wound dressing changes. I relayed this information to the patient. Patient is unable to follow up next week, but agreed to a follow up appointment on 8/23. I have held a tentative OR date for 8/29, in the event that an OR is still needed. However, this can be canceled or postponed after the defect is assessed on 8/23. Patient verbalized understanding and agreement to plan.

## 2020-08-24 ENCOUNTER — Telehealth: Payer: Self-pay

## 2020-08-24 NOTE — Telephone Encounter (Signed)
Wound Care supply order was faxed to [facility] yesterday, 8/11 @ 9:56 am and confirmation fax was received same day.   Called [facility] to get an update, na, left vm.   Called pt to give update. Adv her that [facility] has not contacted our office in response to the fax that was sent and I was unsuccessful speaking to a representative. Adv pt that I would cal first thing Monday morning, 8/15 to contact them again and will call her to give her an update as soon as I get one. Adv pt to leave current dressing on over the weekend. She stated that her sister bought gauze and tape as instructed but she (patient) was not able to change it herself. She reported that her sister and family left to go to the beach and won't be back for another week or so. Adv her that I would inform Matt, PA-C that I adv pt to leave the dressing on as it shouldn't be uncovered at this time. Pt conveyed understanding.

## 2020-08-24 NOTE — Telephone Encounter (Signed)
Patient called to say that she hasn't heard from home health regarding having someone come out to change her dressings.  Please call.

## 2020-08-25 ENCOUNTER — Other Ambulatory Visit: Payer: Self-pay

## 2020-08-25 ENCOUNTER — Ambulatory Visit: Admission: EM | Admit: 2020-08-25 | Discharge: 2020-08-25 | Discharge status: Against Medical Advice | Payer: PPO

## 2020-08-27 ENCOUNTER — Telehealth: Payer: Self-pay | Admitting: Plastic Surgery

## 2020-08-27 NOTE — Telephone Encounter (Signed)
I called the patient to let her know that we were holding an OR date in case the wound area needed to be treated surgically. The patient seemed a bit confused and stated that "no one had called her and she hasn't had her bandage changed at all." I was able to speak with Tina Ingram, who relayed the information that was documented on 8/12, that home health orders had been faxed to [facility] but that we had not received a response as of yet. I relayed this information to Tina Ingram, but she couldn't recall the conversation. I reminded her that she has an appointment next week for [place] to assess the wound area, but that I would be happy to bring her in this week - since it seems we were having trouble with the [facility] assistance. Tina Ingram declined the appointment, as she has no transportation. She stated that she went to the urgent care on Saturday and was told by the nurse that someone from our office told them not to change the bandage. I'm not familiar with this information, so I cannot speak to that situation. However, I assured her that Tina Ingram was working on trying to get [facility] to her home to change the bandages and administer the wound care/dressing changes as ordered.   The patient seems to have difficulty recalling information and the information seems confusing and overwhelming. She voiced that this is very frustrating and she cannot remember who she has spoken with. She said she cannot come to an appointment next week because her sisters were out of town, and I reminded her that I spoke with her sister last week - while she was at the patients house - and all were in agreement of the appointment for 8/23. She said she couldn't remember that, but that it's fine if her sister said it was.   I have relayed the above information to our PA, [employer]. I have also provided an alternate fax # to [facility], [place], so that the [facility] orders can be sent again - with call confirmation requested from [facility].

## 2020-08-27 NOTE — Telephone Encounter (Signed)
Patient calling to follow up on the home health nurse to assist with changes. Please call patient to advise status.

## 2020-08-27 NOTE — Telephone Encounter (Signed)
Called [facility] this morning several different times at several different phone numbers; every attempt has been unsuccessful; have left voice messages at every attempt.   As documented in 8/12 note, a call will be made to pt when update is received from home health.   Per Frances Furbish, please forward phone #'s that have been contacted so that she can assist with getting an update.   [phone] (phone # listed on referral form that was faxed)  [phone], Ext #[phone]   [phone] (found on Google)

## 2020-08-27 NOTE — Telephone Encounter (Signed)
Re-faxed HH orders to [facility] @ [phone] with "response requested" message in comments. Also requested order to be expedited if possible as pt needs dressing changed urgently.

## 2020-08-27 NOTE — Telephone Encounter (Signed)
Please see 8/12 note x update.

## 2020-08-27 NOTE — Telephone Encounter (Signed)
As of 5:19 pm, a response has not been received from [facility].

## 2020-08-28 ENCOUNTER — Telehealth: Payer: Self-pay | Admitting: Plastic Surgery

## 2020-08-28 ENCOUNTER — Ambulatory Visit
Admission: EM | Admit: 2020-08-28 | Discharge: 2020-08-28 | Disposition: A | Discharge status: Home / Self Care | Payer: PPO | Attending: Family Medicine | Admitting: Family Medicine

## 2020-08-28 ENCOUNTER — Encounter: Payer: Self-pay | Admitting: Surgical

## 2020-08-28 DIAGNOSIS — Z4801 Encounter for change or removal of surgical wound dressing: Secondary | ICD-10-CM | POA: Diagnosis not present

## 2020-08-28 NOTE — Discharge Instructions (Addendum)
Dressing changes as recommend by surgery.  Follow up with your surgeon.  Take care  Dr. Lacinda Axon

## 2020-08-28 NOTE — ED Triage Notes (Addendum)
Pt presents s/p Moh's scalp surgery 08/22/20. The area is quite large. Pt reports her bandage came off this morning, but has been loose for several days. Pt reports the area is painful now that the bandage has come off. Pt denies fever, chills or other symptoms. Pt states she has not been taking her antibiotics, states she was not aware she need to take any new medications.

## 2020-08-28 NOTE — Telephone Encounter (Signed)
Pt is aware of Matt's suggestions. She stated she would work on transportation to an urgent care in [place] as she could get to somewhere there faster than she could in [place]. Pt stated that she went to an urgent care this past Saturday and they told her they read in the chart that her bandages were not supposed to be taken off. Adv pt if she can get a ride to urgent care, to call me back and I would personally talk to whoever to advise them. Pt has office # and will call back if needed.

## 2020-08-28 NOTE — ED Provider Notes (Signed)
[facility]    CSN: [id number] Arrival date & time: 08/28/20  1313      History   Chief Complaint Chief Complaint  Patient presents with   Moh's surgery    SCALP    HPI  86 year old female presents for dressing change.  Recently had Mohs surgery of scalp.  Home health was supposed to come out for dressing changes and they have not yet done so.  She states that her bandage came off this morning.  She is essentially here for dressing change.  Mild pain.  No bleeding.  No reports of drainage from the area.  No other complaints at this time.  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 scalp, nasal tip    Patient Active Problem List   Diagnosis Date Noted   Symptomatic anemia    Iron deficiency anemia due to chronic blood loss    Hypokalemia    AKI (acute kidney injury) ([place])    Essential hypertension    Tobacco dependence    GI bleed 07/05/2020    Past Surgical History:  Procedure Laterality Date   APPENDECTOMY     BREAST BIOPSY Left 05/16/2014   complex sclerosing lesion    BREAST EXCISIONAL BIOPSY Left 2016   surgical exc to remove complex sclerosing lesion   BREAST LUMPECTOMY WITH RADIOACTIVE SEED LOCALIZATION Left 06/20/2014   Procedure: BREAST LUMPECTOMY WITH RADIOACTIVE SEED LOCALIZATION;  Surgeon: Erroll Luna, MD;  Location: [place];  Service: General;  Laterality: Left;   CAROTID ENDARTERECTOMY Right    CATARACT EXTRACTION W/PHACO Right 06/25/2015   Procedure: CATARACT EXTRACTION PHACO AND INTRAOCULAR LENS PLACEMENT ([place]);  Surgeon: Estill Cotta, MD;  Location: [facility];  Service: Ophthalmology;  Laterality: Right;  [place] 2.01 AP% 24.5 CDE 52.16 Fluid pack lot # [id number]   COLONOSCOPY     COLONOSCOPY WITH PROPOFOL N/A 07/07/2020   Procedure: COLONOSCOPY WITH  PROPOFOL;  Surgeon: Lesly Rubenstein, MD;  Location: [facility];  Service: Endoscopy;  Laterality: N/A;   ESOPHAGOGASTRODUODENOSCOPY N/A 07/10/2020   Procedure: ESOPHAGOGASTRODUODENOSCOPY (EGD);  Surgeon: Jonathon Bellows, MD;  Location: [facility];  Service: Gastroenterology;  Laterality: N/A;   ESOPHAGOGASTRODUODENOSCOPY (EGD) WITH PROPOFOL N/A 07/06/2020   Procedure: ESOPHAGOGASTRODUODENOSCOPY (EGD) WITH PROPOFOL;  Surgeon: [name], MD;  Location[facility] END[facility];  Service: Endoscopy;  Laterality: N/A;   EYE SURGERY     KYPHOPLASTY N/A 01/27/2017   Procedure: [id number];  Surgeon: Hessie Knows, MD;  Location: [facility];  Service: Orthopedics;  Laterality: N/A;  T-9     OB History   No obstetric history on file.      Home Medications    Prior to Admission medications   Medication Sig Start Date End Date Taking? Authorizing Provider  acetaminophen (TYLENOL) 500 MG tablet Take 500-1,000 mg by mouth See admin instructions. Take 2 tablets ('1000mg'$ ) by mouth every morning and take 1 tablet ('500mg'$ ) by mouth every night   Yes [provider]  amLODipine (NORVASC) 2.5 MG tablet Take 2.5 mg by mouth daily.   Yes [provider]  buPROPion (WELLBUTRIN XL) 150 MG 24 hr tablet Take 150 mg by mouth at bedtime.   Yes [provider]  buPROPion (WELLBUTRIN XL) 300 MG 24 hr tablet Take 300 mg by mouth daily as  needed. 07/18/20  Yes [provider]  cetirizine (ZYRTEC) 10 MG tablet Take 10 mg by mouth daily.   Yes [provider]  hydrochlorothiazide (HYDRODIURIL) 12.5 MG tablet Take 12.5 mg by mouth daily.   Yes [provider]  melatonin 3 MG TABS tablet Take 3 mg by mouth at bedtime.   Yes [provider]  ondansetron (ZOFRAN) 4 MG tablet Take 1 tablet (4 mg total) by mouth every 8 (eight) hours as needed for nausea or vomiting. 08/22/20  Yes Scheeler, Carola Rhine, PA-C  pantoprazole (PROTONIX) 40 MG tablet Take 40 mg by mouth  daily.   Yes [provider]  tiZANidine (ZANAFLEX) 2 MG tablet Take 2 mg by mouth 3 (three) times daily. 08/13/20  Yes [provider]  traZODone (DESYREL) 50 MG tablet Take 50 mg by mouth at bedtime. 12/25/18  Yes [provider]  vitamin B-12 (CYANOCOBALAMIN) 1000 MCG tablet Take 1,000 mcg by mouth daily.   Yes [provider]    Family History Family History  Problem Relation Age of Onset   Breast cancer Neg Hx     Social History Social History   Tobacco Use   Smoking status: Every Day    Packs/day: 0.25    Types: Cigarettes   Smokeless tobacco: Never  Vaping Use   Vaping Use: Never used  Substance Use Topics   Alcohol use: No   Drug use: Never     Allergies   Patient has no known allergies.   Review of Systems Review of Systems Per HPI  Physical Exam Triage Vital Signs ED Triage Vitals  Enc Vitals Group     BP 08/28/20 1354 (!) 157/104     Pulse Rate 08/28/20 1354 91     Resp 08/28/20 1354 18     Temp 08/28/20 1354 97.7 F (36.5 C)     Temp Source 08/28/20 1354 Oral     SpO2 08/28/20 1354 99 %     Weight 08/28/20 1352 110 lb (49.9 kg)     Height 08/28/20 1352 '5\' 5"'$  (1.651 m)     Head Circumference --      Peak Flow --      Pain Score 08/28/20 1351 1     Pain Loc --      Pain Edu? --      Excl. in [place]? --    Updated Vital Signs BP (!) 157/104 (BP Location: Left Arm)   Pulse 91   Temp 97.7 F (36.5 C) (Oral)   Resp 18   Ht '5\' 5"'$  (1.651 m)   Wt 49.9 kg   SpO2 99%   BMI 18.30 kg/m   Visual Acuity Right Eye Distance:   Left Eye Distance:   Bilateral Distance:    Right Eye Near:   Left Eye Near:    Bilateral Near:     Physical Exam Vitals and nursing note reviewed.  Constitutional:      General: She is not in acute distress.    Appearance: Normal appearance. She is not ill-appearing.  HENT:     Head:      Comments: Large wound noted at the left location.  There is no bleeding, drainage, surrounding  erythema.  There are no signs of infection at this time.  Wound is dry. Eyes:     General:        Right eye: No discharge.        Left eye: No discharge.     Conjunctiva/sclera:  Conjunctivae normal.  Cardiovascular:     Rate and Rhythm: Normal rate and regular rhythm.  Pulmonary:     Effort: Pulmonary effort is normal. No respiratory distress.  Neurological:     Mental Status: She is alert.  Psychiatric:        Mood and Affect: Mood normal.        Behavior: Behavior normal.     UC Treatments / Results  Labs (all labs ordered are listed, but only abnormal results are displayed) Labs Reviewed - No data to display  EKG   Radiology No results found.  Procedures Procedures (including critical care time)  Medications Ordered in UC Medications - No data to display  Initial Impression / Assessment and Plan / UC Course  I have reviewed the triage vital signs and the nursing notes.  Pertinent labs & imaging results that were available during my care of the patient were reviewed by me and considered in my medical decision making (see chart for details).    86 year old female presents for dressing change.  Patient recently had Mohs surgery.  She has been due for dressing change and has not been able to get her dressing changed due to the fact that home health has not come out yet.  It is unclear why there has been a delay in her care.  Nursing staff removed the large amount of dried blood in her hair.  The wound does not appear infected.  Dressing was applied per plastic surgery recommendations and patient was discharged home.  Home health will hopefully provide her follow-up dressing changes or she can return for repeat dressing changes here as needed.  Final Clinical Impressions(s) / UC Diagnoses   Final diagnoses:  Dressing change or removal, surgical wound     Discharge Instructions      Dressing changes as recommend by surgery.  Follow up with your surgeon.  Take  care  Dr. Lacinda Axon    ED Prescriptions   None    PDMP not reviewed this encounter.   Coral Spikes, [place] 08/28/20 1536

## 2020-08-28 NOTE — Progress Notes (Signed)
Surgical Clearance has been received from Dr. Baldemar Lenis for patient's upcoming surgery closure of mohs scalp defect with Dr. Marla Roe.  Pt is medically cleared.

## 2020-08-28 NOTE — Telephone Encounter (Signed)
Called [facility] this morning at [phone] at 8:35 am, 8:44 am, 8:55 am, 9:53 am; na, left vm to [place], Publishing copy at 9:53 am; haven't gotten a response back. Consulted with Matt, PA-C and he adv to offer pt an appt to be seen today but pt would need transportation to get here because our providers cannot come out to the house to do it. If she cannot get to the office today, she has the option to be evaluated at the ER and they can dress it. As far as HH, her insurance covers [facility] and we have sent all that we can but we're waiting on a response from them.   Called pt @ [phone] and line was busy.

## 2020-08-28 NOTE — Telephone Encounter (Signed)
Received phone call from [place], [place] at [facility] in [place]. She called in result of me advising pt to call our office when she got there so that we could explain what was going on.   Docia Chuck that our office is working on getting Garrett out to pt's house to change dressings but have been unsuccessful. Pt was offered a same day appt today when I talked to her but she couldn't get here. Docia Chuck that pt needs to have wound covered as her bandage has come completely off and needs to be covered. Docia Chuck that pt has an appt next week with [facility], [facility] (he is aware of this ph call) and hopefully by then we can get [facility] out there. Sheena conveyed understanding.

## 2020-08-28 NOTE — Telephone Encounter (Signed)
Patient called this morning to advise that she had spoken to Dr. Marla Roe a few times and was told that someone would be out to change her dressings and no one has. Last night the bandages came all the way off. She asked if Dr. Janalyn Shy or Catalina Antigua could come to her house and change her dressings and I advised her that they could not and assured her someone was working on getting home health set up for her. Patient seemed to get upset because she was concerned and starting to feel pain now that air is hitting the area. Advised her I would send a message back to have someone follow up with her again on the delay from [place].

## 2020-08-28 NOTE — Telephone Encounter (Signed)
Please see 8/12 note x updates.

## 2020-08-29 ENCOUNTER — Ambulatory Visit: Admit: 2020-08-29 | Payer: No Typology Code available for payment source | Admitting: Plastic Surgery

## 2020-08-29 ENCOUNTER — Telehealth: Payer: Self-pay | Admitting: Plastic Surgery

## 2020-08-29 SURGERY — DEBRIDEMENT, WOUND, WITH CLOSURE
Anesthesia: General | Site: Scalp

## 2020-08-29 NOTE — Telephone Encounter (Signed)
T[place]ania confirmed that fax was received and she was able to get pt processed and scheduled to be seen tomorrow. She will call pt and inform of [facility] coming out to see her from now on to change dressings.

## 2020-08-29 NOTE — Telephone Encounter (Signed)
Tina Ingram from [facility] was transferred from [facility] and stated that she remembers forwarding pt's information to the [place] office. Adv her that I haven't received a call back. She stated that she was forwarding the information to the Director. Adv her that since I didn't receive a response, I faxed it to another [facility] agency.

## 2020-08-29 NOTE — Telephone Encounter (Signed)
This patient called regarding wound care assistance and instructions. Please call to advise [phone]. Thank you.

## 2020-08-29 NOTE — Telephone Encounter (Signed)
T[place]ania with [facility] calling to follow up regarding patient. Please call her at [phone].

## 2020-08-29 NOTE — Telephone Encounter (Signed)
Pt is to leave current dressings on until her sister gets back from vacation if she cannot change dressings herself (which she verbalized she could not). Pt went to urgent care yesterday and they applied new dressings. Matt, PA-C is aware of this matter. Our office is still working on getting in touch with [facility]. I will call pt when I have an update. Ok to give pt this message.

## 2020-08-30 ENCOUNTER — Encounter: Payer: Self-pay | Admitting: Emergency Medicine

## 2020-08-30 ENCOUNTER — Other Ambulatory Visit: Payer: Self-pay

## 2020-08-30 ENCOUNTER — Ambulatory Visit
Admission: EM | Admit: 2020-08-30 | Discharge: 2020-08-30 | Disposition: A | Discharge status: Home / Self Care | Payer: PPO | Attending: Emergency Medicine | Admitting: Emergency Medicine

## 2020-08-30 DIAGNOSIS — Z5189 Encounter for other specified aftercare: Secondary | ICD-10-CM

## 2020-08-30 DIAGNOSIS — Z4801 Encounter for change or removal of surgical wound dressing: Secondary | ICD-10-CM | POA: Diagnosis not present

## 2020-08-30 NOTE — ED Triage Notes (Signed)
PT needs a new dressing. PT is supposed to be keeping affected area moist with [place] Jelly as they plan to use pig skin on her wound Tuesday.   PT has not yet followed up with surgeon. Caretaker states that they have called that office daily.

## 2020-08-30 NOTE — Discharge Instructions (Addendum)
Due to the fact that the dressing keeps coming off I am suggesting that she is sleep upright in a recliner rather than lay down in bed.  The laying in bed may be facilitating the dressing from falling off.  You need to contact your plastic surgeon and schedule an appointment for a reevaluation of the wound as there may need to be further intervention before you are able to have your flap.  If you are unable to reach your plastic surgeon you can return here in 3 days for a dressing change.

## 2020-08-30 NOTE — ED Provider Notes (Signed)
[facility]    CSN: [id number] Arrival date & time: 08/30/20  1058      History   Chief Complaint Chief Complaint  Patient presents with   Wound Check    HPI Juleah SHAKIERA WHITTON is a 86 y.o. female.   HPI  86 year old female here for wound check.  Patient had a Mohs defect surgery on her scalp on 08/22/2020 and is post to have a pigskin flap put over the defect.  The defect measures 5.5 cm x 3 cm.  The patient's surgery has been pushed back several times as her family is currently out of town.  She spoke to be receiving dressing changes every 3 days from home health but they have not come to the patient's residence.  She is here today with a patient service aide.  She was evaluated in this urgent care on 08/28/2020 after her dressing had fallen off, and new dressing was applied, and she was instructed to return in 3 days.  She is back today because the dressing fell off again in her sleep last night.  Patient denies any fever or drainage from the wound.  She needs a new dressing applied as she is awaiting her pigskin flap surgery.  The patient service aide and the patient both indicate that they have not been able to get in contact with the plastic surgeon or the home health agency who spoke to providing the dressing changes.  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 scalp, nasal tip    Patient Active Problem List   Diagnosis Date Noted   Symptomatic anemia    Iron deficiency anemia due to chronic blood loss    Hypokalemia    AKI (acute kidney injury) ([facility])    Essential hypertension    Tobacco dependence    GI bleed 07/05/2020    Past Surgical History:  Procedure Laterality Date   APPENDECTOMY     BREAST BIOPSY Left 05/16/2014   complex sclerosing lesion    BREAST EXCISIONAL BIOPSY Left 2016    surgical exc to remove complex sclerosing lesion   BREAST LUMPECTOMY WITH RADIOACTIVE SEED LOCALIZATION Left 06/20/2014   Procedure: BREAST LUMPECTOMY WITH RADIOACTIVE SEED LOCALIZATION;  Surgeon: Erroll Luna, MD;  Location: [facility];  Service: General;  Laterality: Left;   CAROTID ENDARTERECTOMY Right    CATARACT EXTRACTION W/PHACO Right 06/25/2015   Procedure: CATARACT EXTRACTION PHACO AND INTRAOCULAR LENS PLACEMENT ([place]);  Surgeon: Estill Cotta, MD;  Location: [facility];  Service: Ophthalmology;  Laterality: Right;  [place] 2.01 AP% 24.5 CDE 52.16 Fluid pack lot # [id number]   COLONOSCOPY     COLONOSCOPY WITH PROPOFOL N/A 07/07/2020   Procedure: COLONOSCOPY WITH PROPOFOL;  Surgeon: Lesly Rubenstein, MD;  Location: [facility];  Service: Endoscopy;  Laterality: N/A;   ESOPHAGOGASTRODUODENOSCOPY N/A 07/10/2020   Procedure: ESOPHAGOGASTRODUODENOSCOPY (EGD);  Surgeon: Jonathon Bellows, MD;  Location[facility];  Service: Gastroenterology;  Laterality: N/A;   ESOPHAGOGASTRODUODENOSCOPY (EGD) WITH PROPOFOL N/A 07/06/2020   Procedure: ESOPHAGOGASTRODUODENOSCOPY (EGD) WITH PROPOFOL;  Surgeon: [name], MD;  Location: [facility];  Service: Endoscopy;  Laterality: N/A;   EYE SURGERY     KYPHOPLASTY N/A 01/27/2017   Procedure: [id number];  Surgeon: Hessie Knows, MD;  Location: [facility];  Service: Orthopedics;  Laterality:  N/A;  T-9     OB History   No obstetric history on file.      Home Medications    Prior to Admission medications   Medication Sig Start Date End Date Taking? Authorizing Provider  acetaminophen (TYLENOL) 500 MG tablet Take 500-1,000 mg by mouth See admin instructions. Take 2 tablets ('1000mg'$ ) by mouth every morning and take 1 tablet ('500mg'$ ) by mouth every night    [provider]  amLODipine (NORVASC) 2.5 MG tablet Take 2.5 mg by mouth daily.    [provider]  buPROPion (WELLBUTRIN XL) 150 MG 24 hr tablet Take 150 mg by  mouth at bedtime.    [provider]  buPROPion (WELLBUTRIN XL) 300 MG 24 hr tablet Take 300 mg by mouth daily as needed. 07/18/20   [provider]  cetirizine (ZYRTEC) 10 MG tablet Take 10 mg by mouth daily.    [provider]  hydrochlorothiazide (HYDRODIURIL) 12.5 MG tablet Take 12.5 mg by mouth daily.    [provider]  melatonin 3 MG TABS tablet Take 3 mg by mouth at bedtime.    [provider]  ondansetron (ZOFRAN) 4 MG tablet Take 1 tablet (4 mg total) by mouth every 8 (eight) hours as needed for nausea or vomiting. 08/22/20   Scheeler, Carola Rhine, PA-C  pantoprazole (PROTONIX) 40 MG tablet Take 40 mg by mouth daily.    [provider]  tiZANidine (ZANAFLEX) 2 MG tablet Take 2 mg by mouth 3 (three) times daily. 08/13/20   [provider]  traZODone (DESYREL) 50 MG tablet Take 50 mg by mouth at bedtime. 12/25/18   [provider]  vitamin B-12 (CYANOCOBALAMIN) 1000 MCG tablet Take 1,000 mcg by mouth daily.    [provider]    Family History Family History  Problem Relation Age of Onset   Breast cancer Neg Hx     Social History Social History   Tobacco Use   Smoking status: Every Day    Packs/day: 0.25    Types: Cigarettes   Smokeless tobacco: Never  Vaping Use   Vaping Use: Never used  Substance Use Topics   Alcohol use: No   Drug use: Never     Allergies   Patient has no known allergies.   Review of Systems Review of Systems  Constitutional:  Negative for activity change, appetite change and fever.  Skin:  Positive for wound. Negative for color change.    Physical Exam Triage Vital Signs ED Triage Vitals  Enc Vitals Group     BP 08/30/20 1108 (!) 146/78     Pulse Rate 08/30/20 1108 87     Resp 08/30/20 1108 16     Temp 08/30/20 1108 97.9 F (36.6 C)     Temp Source 08/30/20 1108 Oral     SpO2 08/30/20 1108 96 %     Weight --      Height --      Head Circumference --       Peak Flow --      Pain Score 08/30/20 1107 3     Pain Loc --      Pain Edu? --      Excl. in [place]? --    No data found.  Updated Vital Signs BP (!) 146/78   Pulse 87   Temp 97.9 F (36.6 C) (Oral)   Resp 16   SpO2 96%   Visual Acuity Right Eye Distance:   Left Eye Distance:  Bilateral Distance:    Right Eye Near:   Left Eye Near:    Bilateral Near:     Physical Exam Vitals and nursing note reviewed.  Constitutional:      General: She is not in acute distress.    Appearance: Normal appearance. She is normal weight. She is not ill-appearing.  HENT:     Head: Normocephalic and atraumatic.  Skin:    General: Skin is warm and dry.     Capillary Refill: Capillary refill takes less than 2 seconds.     Findings: Lesion present.  Neurological:     General: No focal deficit present.     Mental Status: She is alert and oriented to person, place, and time.  Psychiatric:        Mood and Affect: Mood normal.        Behavior: Behavior normal.        Thought Content: Thought content normal.        Judgment: Judgment normal.     UC Treatments / Results  Labs (all labs ordered are listed, but only abnormal results are displayed) Labs Reviewed - No data to display  EKG   Radiology No results found.  Procedures Procedures (including critical care time)  Medications Ordered in UC Medications - No data to display  Initial Impression / Assessment and Plan / UC Course  I have reviewed the triage vital signs and the nursing notes.  Pertinent labs & imaging results that were available during my care of the patient were reviewed by me and considered in my medical decision making (see chart for details).  Patient is a pleasant 86 year old female here for dressing change after undergoing Mohs surgery over a week ago.  She is due to have a Paxlovid flap and the wound bed needs to stay moist with K-Y jelly.  Patient states that the dressing is fallen off several times.  She is  here today because the dressing fell off again in the night.  Patient is a very large defect to the crown of the scalp that measures 5.5 cm x 3 cm.  The wound bed is dry and ready.  There is no surrounding erythema there is no drainage present for the wound.  I advised patient that we can apply another dressing but she needs to speak with her plastic surgeon for further evaluation since the wound bed has become dry and may need debriding.  Per the dressing instructions will apply K-Y jelly, Adaptic, secure with Kerlix and Coban.   Final Clinical Impressions(s) / UC Diagnoses   Final diagnoses:  Visit for wound check     Discharge Instructions      Due to the fact that the dressing keeps coming off I am suggesting that she is sleep upright in a recliner rather than lay down in bed.  The laying in bed may be facilitating the dressing from falling off.  You need to contact your plastic surgeon and schedule an appointment for a reevaluation of the wound as there may need to be further intervention before you are able to have your flap.  If you are unable to reach your plastic surgeon you can return here in 3 days for a dressing change.     ED Prescriptions   None    PDMP not reviewed this encounter.   Margarette Canada, NP 08/30/20 1134

## 2020-08-30 NOTE — ED Notes (Signed)
Surgicel lubricant applied to wound base, adaptic dressing placed atop wound, non adherent gauze and sterile gauze placed over adaptic. Head wrapped with kling wrap. Dressing orders given verbally by Lillia Carmel NP

## 2020-09-03 NOTE — Progress Notes (Signed)
Patient is a pleasant 86 year old female s/p recent Mohs excision performed 08/21/2020 who returns to clinic for evaluation and management.  She had been seen here in clinic 08/22/2020 for preoperative examination and has since received medical clearance by her primary care provider.  Plan was for possible application of wound matrix to the scalp, graft, or flap procedure 08/29/2020.  She was also dressed with donated ACell, Adaptic, K-Y jelly, 4 x 4 gauze, Medipore tape, and Kerlix.  She was encouraged to wear a beanie or cap to help secure the dressing.  [facility] home health was consulted.  Unfortunately the planned date for surgery had to be postponed due to her not having an available driver.  Furthermore, there has been difficulty securing home health.  Per most recent note from 08/29/2020, they assured [place] that they would see her 08/30/2020.  Instead, she went to the urgent care for the second time in 3-day period to be evaluated for dressing change.  They noted that she had not been able to get in contact with the plastic surgery group which is inconsistent with the daily telephone encounters noted in chart.  I spoke with our surgical coordinator who states that the OR is reserved for 09/10/2020, but on the condition that she will have family or friend available to drive her to and from surgery.  On my examination, patient is accompanied by her Sister Chauncey Fischer at bedside.  Evidently she has not had any dressing changes since she was last seen in the urgent care on 08/30/2020.  They state that they have not yet heard from home health.  Sister states that she will be able to drive patient to her surgery on Monday.  However, she expresses concern that patient will not be safe for discharge from the OR on Monday in context of general anesthesia while already being functionally limited at home.  It is worth noting that she does have a "helper" come to the home 5 days weekly to help with chores/ADLs, but do not  have any medical qualifications and they are unsure as to whether or not they would be able to assist with her dressing changes.  On my exam, her wound appears desiccated with scab formation.  No longer good healthy granular tissue that would suggest good wound healing.  No spreading erythema or other evidence concerning for cellulitis.  No purulent discharge.  I discussed case with Dr. Marla Roe and showed her the picture from today's encounter.  She agrees that patient should proceed with surgery.  She also states that we can admit patient overnight for observation given that she is already unsteady on her feet and lives alone.  I have added the sisters information to her chart and will reach out to surgical coordinator.  Plan for surgery on 09/10/2020.  I spent 25 minutes reviewing patient's chart, speaking with patient and sister at bedside, and discussing ongoing management.  Greater than 50% was face-to-face.

## 2020-09-04 ENCOUNTER — Telehealth: Payer: Self-pay

## 2020-09-04 ENCOUNTER — Encounter: Payer: Self-pay | Admitting: Physician Assistant

## 2020-09-04 ENCOUNTER — Ambulatory Visit (INDEPENDENT_AMBULATORY_CARE_PROVIDER_SITE_OTHER): Payer: PPO | Admitting: Physician Assistant

## 2020-09-04 ENCOUNTER — Other Ambulatory Visit: Payer: Self-pay

## 2020-09-04 DIAGNOSIS — M952 Other acquired deformity of head: Secondary | ICD-10-CM | POA: Diagnosis not present

## 2020-09-04 DIAGNOSIS — Z9889 Other specified postprocedural states: Secondary | ICD-10-CM

## 2020-09-04 NOTE — Telephone Encounter (Signed)
Tanzania called from [facility] to let you know that when their nurse arrived at the patient's house, [facility] was leaving.  Please call.

## 2020-09-06 ENCOUNTER — Other Ambulatory Visit: Payer: Self-pay

## 2020-09-06 ENCOUNTER — Encounter (HOSPITAL_COMMUNITY): Payer: Self-pay | Admitting: Plastic Surgery

## 2020-09-06 DIAGNOSIS — Z9049 Acquired absence of other specified parts of digestive tract: Secondary | ICD-10-CM | POA: Diagnosis not present

## 2020-09-06 DIAGNOSIS — Z86008 Personal history of in-situ neoplasm of other site: Secondary | ICD-10-CM | POA: Diagnosis not present

## 2020-09-06 DIAGNOSIS — D62 Acute posthemorrhagic anemia: Secondary | ICD-10-CM | POA: Diagnosis not present

## 2020-09-06 DIAGNOSIS — Z9841 Cataract extraction status, right eye: Secondary | ICD-10-CM | POA: Diagnosis not present

## 2020-09-06 DIAGNOSIS — N183 Chronic kidney disease, stage 3 unspecified: Secondary | ICD-10-CM | POA: Diagnosis not present

## 2020-09-06 DIAGNOSIS — Z9181 History of falling: Secondary | ICD-10-CM | POA: Diagnosis not present

## 2020-09-06 DIAGNOSIS — D500 Iron deficiency anemia secondary to blood loss (chronic): Secondary | ICD-10-CM | POA: Diagnosis not present

## 2020-09-06 DIAGNOSIS — F329 Major depressive disorder, single episode, unspecified: Secondary | ICD-10-CM | POA: Diagnosis not present

## 2020-09-06 DIAGNOSIS — Z85828 Personal history of other malignant neoplasm of skin: Secondary | ICD-10-CM | POA: Diagnosis not present

## 2020-09-06 DIAGNOSIS — Z961 Presence of intraocular lens: Secondary | ICD-10-CM | POA: Diagnosis not present

## 2020-09-06 DIAGNOSIS — M545 Low back pain, unspecified: Secondary | ICD-10-CM | POA: Diagnosis not present

## 2020-09-06 DIAGNOSIS — M810 Age-related osteoporosis without current pathological fracture: Secondary | ICD-10-CM | POA: Diagnosis not present

## 2020-09-06 DIAGNOSIS — G8929 Other chronic pain: Secondary | ICD-10-CM | POA: Diagnosis not present

## 2020-09-06 DIAGNOSIS — I1310 Hypertensive heart and chronic kidney disease without heart failure, with stage 1 through stage 4 chronic kidney disease, or unspecified chronic kidney disease: Secondary | ICD-10-CM | POA: Diagnosis not present

## 2020-09-06 DIAGNOSIS — E559 Vitamin D deficiency, unspecified: Secondary | ICD-10-CM | POA: Diagnosis not present

## 2020-09-06 DIAGNOSIS — F17210 Nicotine dependence, cigarettes, uncomplicated: Secondary | ICD-10-CM | POA: Diagnosis not present

## 2020-09-06 DIAGNOSIS — Z86010 Personal history of colonic polyps: Secondary | ICD-10-CM | POA: Diagnosis not present

## 2020-09-06 DIAGNOSIS — I6529 Occlusion and stenosis of unspecified carotid artery: Secondary | ICD-10-CM | POA: Diagnosis not present

## 2020-09-06 NOTE — Progress Notes (Signed)
Spoke with pt's sister, Chauncey Fischer for pre-op call. She states pt does not have cardiac history or Diabetes. Pt is treated for HTN. Vaughan Basta states pt's memory is not very good.  Will need Covid test on arrival.

## 2020-09-07 ENCOUNTER — Telehealth: Payer: Self-pay

## 2020-09-07 ENCOUNTER — Encounter: Payer: PPO | Admitting: Physician Assistant

## 2020-09-07 NOTE — Progress Notes (Signed)
I have sent an email to Tina Ingram with [employer] requesting that pt's 2 sisters be allowed to wait in the waiting area while pt is having surgery.

## 2020-09-07 NOTE — Progress Notes (Signed)
It has been approved that pt may have 2 visitors in the waiting area while pt is in surgery. I have notified pt's sister, Chauncey Fischer of this information. She was very Patent attorney.

## 2020-09-07 NOTE — Telephone Encounter (Signed)
Tina Ingram called to say that she needs verbal orders that were effective 08/31/2020 for skilled nursing to come in the week of 09/03/2020 - one time/week for one week to evaluate.  [name] said we can leave a detailed message on her voicemail.  Please call.

## 2020-09-10 ENCOUNTER — Ambulatory Visit (HOSPITAL_COMMUNITY): Payer: PPO | Admitting: Anesthesiology

## 2020-09-10 ENCOUNTER — Encounter (HOSPITAL_COMMUNITY): Payer: Self-pay | Admitting: Plastic Surgery

## 2020-09-10 ENCOUNTER — Observation Stay (HOSPITAL_COMMUNITY)
Admission: RE | Admit: 2020-09-10 | Discharge: 2020-09-11 | Disposition: A | Discharge status: Home / Self Care | Payer: PPO | Attending: Plastic Surgery | Admitting: Plastic Surgery

## 2020-09-10 ENCOUNTER — Encounter (HOSPITAL_COMMUNITY)
Admission: RE | Disposition: A | Discharge status: Home / Self Care | Payer: Self-pay | Source: Home / Self Care | Attending: Plastic Surgery

## 2020-09-10 ENCOUNTER — Other Ambulatory Visit: Payer: Self-pay

## 2020-09-10 DIAGNOSIS — I129 Hypertensive chronic kidney disease with stage 1 through stage 4 chronic kidney disease, or unspecified chronic kidney disease: Secondary | ICD-10-CM | POA: Insufficient documentation

## 2020-09-10 DIAGNOSIS — C4442 Squamous cell carcinoma of skin of scalp and neck: Secondary | ICD-10-CM | POA: Diagnosis not present

## 2020-09-10 DIAGNOSIS — Z9889 Other specified postprocedural states: Secondary | ICD-10-CM | POA: Diagnosis not present

## 2020-09-10 DIAGNOSIS — N189 Chronic kidney disease, unspecified: Secondary | ICD-10-CM | POA: Insufficient documentation

## 2020-09-10 DIAGNOSIS — F17210 Nicotine dependence, cigarettes, uncomplicated: Secondary | ICD-10-CM | POA: Diagnosis not present

## 2020-09-10 DIAGNOSIS — Z79899 Other long term (current) drug therapy: Secondary | ICD-10-CM | POA: Diagnosis not present

## 2020-09-10 DIAGNOSIS — C4490 Unspecified malignant neoplasm of skin, unspecified: Secondary | ICD-10-CM | POA: Diagnosis present

## 2020-09-10 DIAGNOSIS — M952 Other acquired deformity of head: Secondary | ICD-10-CM | POA: Diagnosis not present

## 2020-09-10 DIAGNOSIS — Z20822 Contact with and (suspected) exposure to covid-19: Secondary | ICD-10-CM | POA: Diagnosis not present

## 2020-09-10 DIAGNOSIS — N179 Acute kidney failure, unspecified: Secondary | ICD-10-CM | POA: Diagnosis not present

## 2020-09-10 DIAGNOSIS — D500 Iron deficiency anemia secondary to blood loss (chronic): Secondary | ICD-10-CM | POA: Diagnosis not present

## 2020-09-10 DIAGNOSIS — I10 Essential (primary) hypertension: Secondary | ICD-10-CM | POA: Diagnosis not present

## 2020-09-10 HISTORY — DX: Family history of other specified conditions: Z84.89

## 2020-09-10 LAB — BASIC METABOLIC PANEL
Anion gap: 9 (ref 5–15)
BUN: 16 mg/dL (ref 8–23)
CO2: 30 mmol/L (ref 22–32)
Calcium: 9.5 mg/dL (ref 8.9–10.3)
Chloride: 100 mmol/L (ref 98–111)
Creatinine, Ser: 1.49 mg/dL — ABNORMAL HIGH (ref 0.44–1.00)
GFR, Estimated: 35 mL/min — ABNORMAL LOW (ref >60)
Glucose, Bld: 91 mg/dL (ref 70–99)
Potassium: 3 mmol/L — ABNORMAL LOW (ref 3.5–5.1)
Sodium: 139 mmol/L (ref 135–145)

## 2020-09-10 LAB — CBC
HCT: 41.3 % (ref 36.0–46.0)
Hemoglobin: 12.3 g/dL (ref 12.0–15.0)
MCH: 22.7 pg — ABNORMAL LOW (ref 26.0–34.0)
MCHC: 29.8 g/dL — ABNORMAL LOW (ref 30.0–36.0)
MCV: 76.3 fL — ABNORMAL LOW (ref 80.0–100.0)
Platelets: 687 10*3/uL — ABNORMAL HIGH (ref 150–400)
RBC: 5.41 MIL/uL — ABNORMAL HIGH (ref 3.87–5.11)
RDW: 22.9 % — ABNORMAL HIGH (ref 11.5–15.5)
WBC: 10.3 10*3/uL (ref 4.0–10.5)
nRBC: 0 % (ref 0.0–0.2)

## 2020-09-10 LAB — SARS CORONAVIRUS 2 BY RT PCR (HOSPITAL ORDER, PERFORMED IN ~~LOC~~ HOSPITAL LAB): SARS Coronavirus 2: NEGATIVE

## 2020-09-10 SURGERY — APPLICATION, SKIN SUBSTITUTE
Anesthesia: General

## 2020-09-10 MED ORDER — HYDROMORPHONE HCL 1 MG/ML IJ SOLN: 0.2500 mg | INTRAMUSCULAR | Status: DC | PRN

## 2020-09-10 MED ORDER — LIDOCAINE 2% (20 MG/ML) 5 ML SYRINGE
INTRAMUSCULAR | Status: DC | PRN
  Administered 2020-09-10: 60 mg via INTRAVENOUS

## 2020-09-10 MED ORDER — CHLORHEXIDINE GLUCONATE 0.12 % MT SOLN
15.0000 mL | Freq: Once | OROMUCOSAL | Status: AC
Start: 2020-09-10 — End: 2020-09-10
  Administered 2020-09-10: 15 mL via OROMUCOSAL
  Filled 2020-09-10: qty 15

## 2020-09-10 MED ORDER — OXYCODONE HCL 5 MG PO TABS: 5.0000 mg | ORAL_TABLET | ORAL | Status: DC | PRN

## 2020-09-10 MED ORDER — DEXAMETHASONE SODIUM PHOSPHATE 4 MG/ML IJ SOLN
INTRAMUSCULAR | Status: DC | PRN
Start: 2020-09-10 — End: 2020-09-10
  Administered 2020-09-10: 5 mg via INTRAVENOUS

## 2020-09-10 MED ORDER — ZOLPIDEM TARTRATE 5 MG PO TABS: 5.0000 mg | ORAL_TABLET | Freq: Every evening | ORAL | Status: DC | PRN

## 2020-09-10 MED ORDER — PHENYLEPHRINE 40 MCG/ML (10ML) SYRINGE FOR IV PUSH (FOR BLOOD PRESSURE SUPPORT)
PREFILLED_SYRINGE | INTRAVENOUS | Status: AC
  Filled 2020-09-10: qty 10

## 2020-09-10 MED ORDER — BUPIVACAINE-EPINEPHRINE 0.25% -1:200000 IJ SOLN
INTRAMUSCULAR | Status: DC | PRN
  Administered 2020-09-10: 8.5 mL

## 2020-09-10 MED ORDER — ACETAMINOPHEN 325 MG PO TABS
325.0000 mg | ORAL_TABLET | Freq: Four times a day (QID) | ORAL | Status: DC
  Administered 2020-09-10 – 2020-09-11 (×3): 325 mg via ORAL
  Filled 2020-09-10 (×3): qty 1

## 2020-09-10 MED ORDER — ACETAMINOPHEN 500 MG PO TABS
ORAL_TABLET | ORAL | Status: AC
  Administered 2020-09-10: 1000 mg via ORAL
  Filled 2020-09-10: qty 2

## 2020-09-10 MED ORDER — ACETAMINOPHEN 325 MG PO TABS: 650.0000 mg | ORAL_TABLET | ORAL | Status: DC | PRN

## 2020-09-10 MED ORDER — FENTANYL CITRATE (PF) 250 MCG/5ML IJ SOLN
INTRAMUSCULAR | Status: AC
  Filled 2020-09-10: qty 5

## 2020-09-10 MED ORDER — PROPOFOL 10 MG/ML IV BOLUS
INTRAVENOUS | Status: AC
  Filled 2020-09-10: qty 20

## 2020-09-10 MED ORDER — SODIUM CHLORIDE 0.9% FLUSH: 3.0000 mL | Freq: Two times a day (BID) | INTRAVENOUS | Status: DC

## 2020-09-10 MED ORDER — SODIUM CHLORIDE 0.9 % IV SOLN: 250.0000 mL | INTRAVENOUS | Status: DC | PRN

## 2020-09-10 MED ORDER — MORPHINE SULFATE (PF) 2 MG/ML IV SOLN: 1.0000 mg | INTRAVENOUS | Status: DC | PRN

## 2020-09-10 MED ORDER — HYDROCODONE-ACETAMINOPHEN 5-325 MG PO TABS: 1.0000 | ORAL_TABLET | Freq: Four times a day (QID) | ORAL | Status: DC | PRN

## 2020-09-10 MED ORDER — PHENYLEPHRINE 40 MCG/ML (10ML) SYRINGE FOR IV PUSH (FOR BLOOD PRESSURE SUPPORT)
PREFILLED_SYRINGE | INTRAVENOUS | Status: DC | PRN
  Administered 2020-09-10 (×8): 80 ug via INTRAVENOUS

## 2020-09-10 MED ORDER — SODIUM CHLORIDE 0.9% FLUSH: 3.0000 mL | INTRAVENOUS | Status: DC | PRN

## 2020-09-10 MED ORDER — PROPOFOL 10 MG/ML IV BOLUS
INTRAVENOUS | Status: DC | PRN
  Administered 2020-09-10: 150 mg via INTRAVENOUS

## 2020-09-10 MED ORDER — AMISULPRIDE (ANTIEMETIC) 5 MG/2ML IV SOLN: 5.0000 mg | Freq: Once | INTRAVENOUS | Status: DC | PRN

## 2020-09-10 MED ORDER — LIDOCAINE-EPINEPHRINE 1 %-1:100000 IJ SOLN
INTRAMUSCULAR | Status: DC | PRN
  Administered 2020-09-10: 8.5 mL

## 2020-09-10 MED ORDER — ACETAMINOPHEN 500 MG PO TABS: 1000.0000 mg | ORAL_TABLET | Freq: Once | ORAL | Status: AC

## 2020-09-10 MED ORDER — ACETAMINOPHEN 650 MG RE SUPP: 650.0000 mg | RECTAL | Status: DC | PRN

## 2020-09-10 MED ORDER — FENTANYL CITRATE (PF) 250 MCG/5ML IJ SOLN
INTRAMUSCULAR | Status: DC | PRN
  Administered 2020-09-10 (×2): 25 ug via INTRAVENOUS

## 2020-09-10 MED ORDER — BACITRACIN ZINC 500 UNIT/GM EX OINT
TOPICAL_OINTMENT | CUTANEOUS | Status: AC
  Filled 2020-09-10: qty 28.35

## 2020-09-10 MED ORDER — IBUPROFEN 200 MG PO TABS: 200.0000 mg | ORAL_TABLET | Freq: Four times a day (QID) | ORAL | Status: DC

## 2020-09-10 MED ORDER — ONDANSETRON HCL 4 MG/2ML IJ SOLN
INTRAMUSCULAR | Status: DC | PRN
  Administered 2020-09-10: 4 mg via INTRAVENOUS

## 2020-09-10 MED ORDER — FENTANYL CITRATE (PF) 100 MCG/2ML IJ SOLN: 25.0000 ug | INTRAMUSCULAR | Status: DC | PRN

## 2020-09-10 MED ORDER — KCL IN DEXTROSE-NACL 10-5-0.45 MEQ/L-%-% IV SOLN
INTRAVENOUS | Status: DC
  Filled 2020-09-10: qty 1000

## 2020-09-10 MED ORDER — CEFAZOLIN SODIUM-DEXTROSE 2-4 GM/100ML-% IV SOLN
2.0000 g | INTRAVENOUS | Status: AC
  Administered 2020-09-10: 2 g via INTRAVENOUS
  Filled 2020-09-10: qty 100

## 2020-09-10 MED ORDER — DEXTROSE 5 % IV SOLN
0.5000 g | Freq: Two times a day (BID) | INTRAVENOUS | Status: DC
  Administered 2020-09-10: 0.5 g via INTRAVENOUS
  Filled 2020-09-10 (×2): qty 5

## 2020-09-10 MED ORDER — ORAL CARE MOUTH RINSE: 15.0000 mL | Freq: Once | OROMUCOSAL | Status: AC

## 2020-09-10 MED ORDER — LACTATED RINGERS IV SOLN: INTRAVENOUS | Status: DC

## 2020-09-10 MED ORDER — DIPHENHYDRAMINE HCL 50 MG/ML IJ SOLN: 12.5000 mg | Freq: Four times a day (QID) | INTRAMUSCULAR | Status: DC | PRN

## 2020-09-10 MED ORDER — ONDANSETRON HCL 4 MG/2ML IJ SOLN: 4.0000 mg | Freq: Four times a day (QID) | INTRAMUSCULAR | Status: DC | PRN

## 2020-09-10 MED ORDER — DIPHENHYDRAMINE HCL 12.5 MG/5ML PO ELIX
12.5000 mg | ORAL_SOLUTION | Freq: Four times a day (QID) | ORAL | Status: DC | PRN
  Filled 2020-09-10: qty 5

## 2020-09-10 MED ORDER — CHLORHEXIDINE GLUCONATE 4 % EX LIQD: 1.0000 "application " | Freq: Once | CUTANEOUS | Status: DC

## 2020-09-10 MED ORDER — MIDAZOLAM HCL 2 MG/2ML IJ SOLN
INTRAMUSCULAR | Status: AC
  Filled 2020-09-10: qty 2

## 2020-09-10 MED ORDER — BUPIVACAINE HCL (PF) 0.25 % IJ SOLN
INTRAMUSCULAR | Status: AC
  Filled 2020-09-10: qty 30

## 2020-09-10 MED ORDER — LIDOCAINE-EPINEPHRINE 1 %-1:100000 IJ SOLN
INTRAMUSCULAR | Status: AC
  Filled 2020-09-10: qty 1

## 2020-09-10 MED ORDER — ONDANSETRON HCL 4 MG/2ML IJ SOLN: 4.0000 mg | Freq: Once | INTRAMUSCULAR | Status: DC | PRN

## 2020-09-10 MED ORDER — ONDANSETRON 4 MG PO TBDP: 4.0000 mg | ORAL_TABLET | Freq: Four times a day (QID) | ORAL | Status: DC | PRN

## 2020-09-10 MED ORDER — DEXTROSE 5 % IV SOLN: 0.5000 g | Freq: Three times a day (TID) | INTRAVENOUS | Status: DC

## 2020-09-10 SURGICAL SUPPLY — 42 items
BAG COUNTER SPONGE SURGICOUNT (BAG) ×2 IMPLANT
BAG SPNG CNTER NS LX DISP (BAG) ×1
BLADE SURG 10 STRL SS (BLADE) ×4 IMPLANT
BNDG CONFORM 2 STRL LF (GAUZE/BANDAGES/DRESSINGS) IMPLANT
BNDG GAUZE ELAST 4 BULKY (GAUZE/BANDAGES/DRESSINGS) IMPLANT
CANISTER SUCT 3000ML PPV (MISCELLANEOUS) IMPLANT
CLEANER TIP ELECTROSURG 2X2 (MISCELLANEOUS) ×2 IMPLANT
CNTNR URN SCR LID CUP LEK RST (MISCELLANEOUS) ×1 IMPLANT
CONT SPEC 4OZ STRL OR WHT (MISCELLANEOUS) ×2
COVER SURGICAL LIGHT HANDLE (MISCELLANEOUS) ×2 IMPLANT
DRSG EMULSION OIL 3X3 NADH (GAUZE/BANDAGES/DRESSINGS) IMPLANT
ELECT COATED BLADE 2.86 ST (ELECTRODE) ×2 IMPLANT
ELECT NEEDLE TIP 2.8 STRL (NEEDLE) IMPLANT
ELECT REM PT RETURN 9FT ADLT (ELECTROSURGICAL) ×2
ELECTRODE REM PT RTRN 9FT ADLT (ELECTROSURGICAL) ×1 IMPLANT
GAUZE 4X4 16PLY ~~LOC~~+RFID DBL (SPONGE) ×4 IMPLANT
GAUZE SPONGE 4X4 12PLY STRL (GAUZE/BANDAGES/DRESSINGS) ×2 IMPLANT
GLOVE SURG ENC MOIS LTX SZ6.5 (GLOVE) ×2 IMPLANT
GOWN STRL REUS W/ TWL LRG LVL3 (GOWN DISPOSABLE) ×2 IMPLANT
GOWN STRL REUS W/TWL LRG LVL3 (GOWN DISPOSABLE) ×4
KIT BASIN OR (CUSTOM PROCEDURE TRAY) ×2 IMPLANT
KIT TURNOVER KIT B (KITS) ×2 IMPLANT
NEEDLE HYPO 25GX1X1/2 BEV (NEEDLE) ×2 IMPLANT
NS IRRIG 1000ML POUR BTL (IV SOLUTION) ×2 IMPLANT
PAD ABD 8X10 STRL (GAUZE/BANDAGES/DRESSINGS) ×2 IMPLANT
PAD ARMBOARD 7.5X6 YLW CONV (MISCELLANEOUS) ×4 IMPLANT
PENCIL BUTTON HOLSTER BLD 10FT (ELECTRODE) ×2 IMPLANT
SUT CHROMIC 4 0 P 3 18 (SUTURE) ×2 IMPLANT
SUT ETHILON 4 0 PS 2 18 (SUTURE) ×2 IMPLANT
SUT ETHILON 5 0 P 3 18 (SUTURE) ×1
SUT MNCRL AB 4-0 PS2 18 (SUTURE) ×4 IMPLANT
SUT MON AB 3-0 SH 27 (SUTURE) ×6
SUT MON AB 3-0 SH27 (SUTURE) ×3 IMPLANT
SUT NYLON ETHILON 5-0 P-3 1X18 (SUTURE) ×1 IMPLANT
SUT SILK 4 0 (SUTURE) ×2
SUT SILK 4-0 18XBRD TIE 12 (SUTURE) ×1 IMPLANT
SYR BULB IRRIG 60ML STRL (SYRINGE) IMPLANT
TOWEL GREEN STERILE (TOWEL DISPOSABLE) ×2 IMPLANT
TOWEL GREEN STERILE FF (TOWEL DISPOSABLE) ×2 IMPLANT
TRAY ENT MC OR (CUSTOM PROCEDURE TRAY) ×2 IMPLANT
WATER STERILE IRR 1000ML POUR (IV SOLUTION) ×2 IMPLANT
YANKAUER SUCT BULB TIP NO VENT (SUCTIONS) IMPLANT

## 2020-09-10 NOTE — Discharge Instructions (Signed)
Keep head of bed elevated as able. May shower and get area wet in 3 days.

## 2020-09-10 NOTE — Anesthesia Preprocedure Evaluation (Addendum)
Anesthesia Evaluation  Patient identified by MRN, date of birth, ID band Patient awake    Reviewed: Allergy & Precautions, NPO status , Patient's Chart, lab work & pertinent test results  Airway Mallampati: III  TM Distance: >3 FB Neck ROM: Full    Dental  (+) Missing   Pulmonary Current Smoker and Patient abstained from smoking.,    Pulmonary exam normal breath sounds clear to auscultation       Cardiovascular hypertension, Pt. on medications Normal cardiovascular exam Rhythm:Regular Rate:Normal  ECG: NSR, rate 85   Neuro/Psych negative neurological ROS  negative psych ROS   GI/Hepatic negative GI ROS, Neg liver ROS,   Endo/Other  negative endocrine ROS  Renal/GU Renal disease     Musculoskeletal negative musculoskeletal ROS (+)   Abdominal   Peds  Hematology negative hematology ROS (+)   Anesthesia Other Findings mohs defect of scalp  Reproductive/Obstetrics                          Anesthesia Physical Anesthesia Plan  ASA: 2  Anesthesia Plan: General   Post-op Pain Management:    Induction:   PONV Risk Score and Plan: 2 and Ondansetron, Dexamethasone and Treatment may vary due to age or medical condition  Airway Management Planned: LMA  Additional Equipment:   Intra-op Plan:   Post-operative Plan: Extubation in OR  Informed Consent: I have reviewed the patients History and Physical, chart, labs and discussed the procedure including the risks, benefits and alternatives for the proposed anesthesia with the patient or authorized representative who has indicated his/her understanding and acceptance.     Dental advisory given  Plan Discussed with: CRNA  Anesthesia Plan Comments:        Anesthesia Quick Evaluation

## 2020-09-10 NOTE — Telephone Encounter (Deleted)
 Called and spoke with Olevia Bowens with  [facility] on (09/07/20) regarding the message below.  Informed her that Loney Loh approved the orders below.  Also informed her that the patient wi

## 2020-09-10 NOTE — Progress Notes (Signed)
Patient resting quietly at this time awaiting room assignment

## 2020-09-10 NOTE — Interval H&P Note (Signed)
History and Physical Interval Note:  09/10/2020 11:46 AM  Tina Ingram  has presented today for surgery, with the diagnosis of mohs defect of scalp.  The various methods of treatment have been discussed with the patient and family. After consideration of risks, benefits and other options for treatment, the patient has consented to  Procedure(s): APPLICATION OF SKIN SUBSTITUTE A-CELL (N/A) as a surgical intervention.  The patient's history has been reviewed, patient examined, no change in status, stable for surgery.  I have reviewed the patient's chart and labs.  Questions were answered to the patient's satisfaction.     Loel Lofty Oswell Say

## 2020-09-10 NOTE — Progress Notes (Signed)
PHARMACY NOTE:  ANTIMICROBIAL RENAL DOSAGE ADJUSTMENT  Current antimicrobial regimen includes a mismatch between antimicrobial dosage and estimated renal function.  As per policy approved by the Pharmacy & Therapeutics and Medical Executive Committees, the antimicrobial dosage will be adjusted accordingly.  Current antimicrobial dosage:  cefazolin 0.5 g IV q8h x 7d  Indication: surgical prophylaxis  Renal Function:  Estimated Creatinine Clearance: 22.5 mL/min (A) (by C-G formula based on SCr of 1.49 mg/dL (H)). '[]'$      On intermittent HD, scheduled: '[]'$      On CRRT    Antimicrobial dosage has been changed to:  cefazolin 0.5 g IV q12h x 7d  Additional comments:  Thank you for involving pharmacy in this patient's care.  Renold Genta, PharmD, BCPS Clinical Pharmacist Clinical phone for 09/10/2020 until 10p is [phone] 09/10/2020 5:46 PM  **Pharmacist phone directory can be found on [email or web address] listed under [place]**

## 2020-09-10 NOTE — Anesthesia Postprocedure Evaluation (Signed)
Anesthesia Post Note  Patient: Tina Ingram  Procedure(s) Performed: Primary Closure of Head Wound     Patient location during evaluation: PACU Anesthesia Type: General Level of consciousness: awake Pain management: pain level controlled Vital Signs Assessment: post-procedure vital signs reviewed and stable Respiratory status: spontaneous breathing, nonlabored ventilation, respiratory function stable and patient connected to nasal cannula oxygen Cardiovascular status: blood pressure returned to baseline and stable Postop Assessment: no apparent nausea or vomiting Anesthetic complications: no   No notable events documented.  Last Vitals:  Vitals:   09/10/20 1600 09/10/20 1743  BP: (!) 153/78 (!) 159/68  Pulse: 72 94  Resp: 16 20  Temp: 36.4 C (!) 36.4 C  SpO2:  96%    Last Pain:  Vitals:   09/10/20 1743  TempSrc: Oral  PainSc:                  Dehaven Sine P Dariel Betzer

## 2020-09-10 NOTE — Telephone Encounter (Signed)
Called and spoke with [place] with [facility] on (09/04/20) regarding the message below.  Informed [place] that while the patient was here in the office today she was asked about the nurse coming to see her.  She and her sister's both stated that no one every showed up to care for her.   [place] stated that a nurse from [facility] went out to see the patient, but when she arrived there a nurse from [facility] was there and she informed the nurse with [facility] that they were taking care of the patient.//AB/CMA

## 2020-09-10 NOTE — Op Note (Addendum)
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 female born on 07-24-1934, is here for treatment of a mohs scalp defect after excision of skin cancer.   PROCEDURE DETAILS:  The patient was seen prior to surgery and marked.  The IV antibiotics were given. The patient was taken to the operating room and given a general anesthetic. A standard time out was performed and all information was confirmed by those in the room. SCDs were placed.   The head was prepped and draped.  Local with epinephrine was injected around the area.  The #10 blade was used to excise the nonviable tissue at the edges and base of the 3 x 5 cm wound.  It was clean with saline.  Undermining was done for 4 -5 cm of the scalp.  Hemostasis was achieved with electrocautery.  The 3-0 Monocryl was used approximate the skin edges with vertical mattress sutures followed by a 4-0 Monocryl to close the skin edges.  A sterile dressing was applied. The patient was allowed to wake up and taken to recovery room in stable condition at the end of the case. The family was notified at the end of the case.   The advanced practice practitioner (APP) assisted throughout the case.  The APP was essential in retraction and counter traction when needed to make the case progress smoothly.  This retraction and assistance made it possible to see the tissue plans for the procedure.  The assistance was needed for blood control, tissue re-approximation and assisted with closure of the incision site.

## 2020-09-10 NOTE — Transfer of Care (Signed)
Immediate Anesthesia Transfer of Care Note  Patient: Tina Ingram  Procedure(s) Performed: Primary Closure of Head Wound  Patient Location: PACU  Anesthesia Type:General  Level of Consciousness: drowsy and patient cooperative  Airway & Oxygen Therapy: Patient Spontanous Breathing and Patient connected to nasal cannula oxygen  Post-op Assessment: Report given to RN, Post -op Vital signs reviewed and stable and Patient moving all extremities X 4  Post vital signs: Reviewed and stable  Last Vitals:  Vitals Value Taken Time  BP 157/78 09/10/20 1327  Temp    Pulse 82 09/10/20 1327  Resp 21 09/10/20 1327  SpO2 100 % 09/10/20 1327  Vitals shown include unvalidated device data.  Last Pain:  Vitals:   09/10/20 0854  TempSrc:   PainSc: 0-No pain      Patients Stated Pain Goal: 4 ([phone] [phone])  Complications: No notable events documented.

## 2020-09-10 NOTE — Anesthesia Procedure Notes (Signed)
Procedure Name: LMA Insertion Date/Time: 09/10/2020 12:21 PM Performed by: Michele Rockers, CRNA Pre-anesthesia Checklist: Patient identified, Patient being monitored, Timeout performed, Emergency Drugs available and Suction available Patient Re-evaluated:Patient Re-evaluated prior to induction Oxygen Delivery Method: Circle system utilized Preoxygenation: Pre-oxygenation with 100% oxygen Induction Type: IV induction Ventilation: Mask ventilation without difficulty LMA Size: 4.0 Number of attempts: 1 Placement Confirmation: positive ETCO2 and breath sounds checked- equal and bilateral Tube secured with: Tape Dental Injury: Teeth and Oropharynx as per pre-operative assessment

## 2020-09-10 NOTE — Telephone Encounter (Signed)
Called and spoke with Olevia Bowens with [facility] on (09/07/20) regarding the message below.  Informed her that Loney Loh approved the orders below.   Also informed her that the patient will be going to surgery on (Mon-09-10-20) for Primary Closure of the scalp.  She will be staying over night, and will be going home on Tuesday.   Olevia Bowens stated that the orders my change between now and Monday.  If they have staff they we send someone out for the patient.//AB/CMA

## 2020-09-11 DIAGNOSIS — C4442 Squamous cell carcinoma of skin of scalp and neck: Secondary | ICD-10-CM | POA: Diagnosis not present

## 2020-09-11 NOTE — Progress Notes (Signed)
Patient alert and oriented, voiding adequately, skin clean, dry and intact without evidence of skin break down, or symptoms of complications - no redness or edema noted, only slight tenderness at site.  Patient states no pain at time of discharge. Patient has an appointment with MD .

## 2020-09-11 NOTE — Discharge Summary (Signed)
Physician Discharge Summary  Patient ID: Tina Ingram MRN: [id number] DOB/AGE: [birth year] 84 y.o.  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 Marla Roe.    Consults: None  Significant Diagnostic Studies: None.  Treatments: antibiotics: Ancef and surgery: Primary closure of Mohs defect on scalp.  Antibiotic ointment is applied.  Dressed with gauze.  Discharge Exam: Blood pressure 133/62, pulse 90, temperature 97.9 F (36.6 C), temperature source Oral, resp. rate 16, height '5\' 5"'$  (1.651 m), weight 49.9 kg, SpO2 98 %. General appearance: alert, cooperative, and ambulatory.  Disposition: Discharge disposition: 01-Home or Self Care       Discharge Instructions     Call MD for:   Complete by: As directed    Call MD for:  difficulty breathing, headache or visual disturbances   Complete by: As directed    Call MD for:  extreme fatigue   Complete by: As directed    Call MD for:  hives   Complete by: As directed    Call MD for:  persistant dizziness or light-headedness   Complete by: As directed    Call MD for:  persistant nausea and vomiting   Complete by: As directed    Call MD for:  redness, tenderness, or signs of infection (pain, swelling, redness, odor or Ransom Nickson/yellow discharge around incision site)   Complete by: As directed    Call MD for:  severe uncontrolled pain   Complete by: As directed    Call MD for:  temperature >100.4   Complete by: As directed    Diet - low sodium heart healthy   Complete by: As directed    Discharge instructions   Complete by: As directed    Apply antibiotic ointment to incision site daily with  gauze dressing.   Increase activity slowly   Complete by: As directed       Allergies as of 09/11/2020   No Known Allergies      Medication List     STOP taking these medications    acetaminophen 500 MG tablet Commonly known as: TYLENOL   ondansetron 4 MG tablet Commonly known as: Zofran       TAKE these medications    amLODipine 2.5 MG tablet Commonly known as: NORVASC Take 2.5 mg by mouth daily.   buPROPion 300 MG 24 hr tablet Commonly known as: WELLBUTRIN XL Take 300 mg by mouth at bedtime.   cetirizine 10 MG tablet Commonly known as: ZYRTEC Take 10 mg by mouth daily.   diclofenac Sodium 1 % Gel Commonly known as: VOLTAREN Apply 1 application topically 4 (four) times daily as needed (pain).   hydrochlorothiazide 12.5 MG tablet Commonly known as: HYDRODIURIL Take 12.5 mg by mouth daily.   melatonin 3 MG Tabs tablet Take 3 mg by mouth at bedtime.   pantoprazole 40 MG tablet Commonly known as: PROTONIX Take 40 mg by mouth daily.   traZODone 50 MG tablet Commonly known as: DESYREL Take 50 mg by mouth at bedtime.   Vitamin D 50 MCG (2000 UT) tablet Take 2,000 Units by mouth daily.        Follow-up Information     Dillingham, Loel Lofty, DO Follow up in 10 day(s).  Specialty: Plastic Surgery Contact information: [address] [place] [address] [phone]                 Signed: Krista Blue 09/11/2020, 8:35 AM

## 2020-09-12 ENCOUNTER — Other Ambulatory Visit: Payer: Self-pay | Admitting: Surgical

## 2020-09-12 ENCOUNTER — Telehealth: Payer: Self-pay | Admitting: Plastic Surgery

## 2020-09-12 ENCOUNTER — Telehealth: Payer: Self-pay

## 2020-09-12 MED ORDER — TRAMADOL HCL 50 MG PO TABS: 50.0000 mg | ORAL_TABLET | Freq: Two times a day (BID) | ORAL | 0 refills | Status: AC | PRN

## 2020-09-12 NOTE — Telephone Encounter (Signed)
Please see other 09/12/20 telephone note x updates.

## 2020-09-12 NOTE — Progress Notes (Signed)
Postop pain medication, patient notified to not take trazodone in conjunction with tramadol due to sedation risk.

## 2020-09-12 NOTE — Telephone Encounter (Signed)
Disregard pharmacy listed in previous message. Thank you.

## 2020-09-12 NOTE — Telephone Encounter (Signed)
Rhonda from [employer], the [employer], called with the patient on the phone as well.  Suanne Marker said that the patient would like to know what exactly was done to her.  Suanne Marker said that the patient would like to know if the wound on her head is closed.  She also wanted [place] to know that the patient lives alone and if she needs wound care, then she will need help getting it.  Please call.  Suanne Marker said that she is on the phone all day, and we may call her at [phone], but we may want to call the patient directly at [phone].

## 2020-09-12 NOTE — Telephone Encounter (Signed)
Per Catalina Antigua (real time by phone), pt is apply topical antibiotic ointment (whatever is fine- bacitracin/OTC), twice daily. Can otherwise leave it open.   Will call pt and inform after Dr. Claudia Desanctis and Catalina Antigua are finished with office. Will also discuss pain medication instructions per other telephone note from today.

## 2020-09-12 NOTE — Telephone Encounter (Signed)
Advised pt of Matt's instructions. Pt verbalized she didn't care what Catalina Antigua said and that the excision needed to be covered. Pt stated that she doesn't know what she's feeling and that none of her family members want to do it because they see her as a liability. Pt stated that she wanted [facility] to come back out and cover her wound. I adv pt that it didn't need to be covered but just antibiotic ointment needed to be applied.   Pt verbalized she didn't know what kind of SX she had on 8/29 and what was done. I asked if pt wanted to discuss the [place] with Dr. Marla Roe by televisit, phone call or in person visit; pt stated at this point it didn't matter. Pt stated that she wanted [facility] to come back out. Per Angela's note on 8/29 with [name], RN from [facility], they would send someone back out if they had someone.   I called [name] and gave verbals for resumption of care. I then spoke to [facility], who said the wound should not be covered and that he would call pt tomorrow. I called [name] and cx the verbal order for resumption of care. [name] sounded confused and stated that if an ointment has be applied then it should be covered. I adv her that I would call her back if need be after Catalina Antigua talked to pt. She conveyed understanding.

## 2020-09-12 NOTE — Telephone Encounter (Signed)
Patient's is inquiring about pain medication directions. SX 8/17. Please call to update/ advise [phone]. Thank you.   [facility] [address].

## 2020-09-13 ENCOUNTER — Telehealth: Payer: Self-pay | Admitting: Plastic Surgery

## 2020-09-13 DIAGNOSIS — L600 Ingrowing nail: Secondary | ICD-10-CM | POA: Diagnosis not present

## 2020-09-13 DIAGNOSIS — B351 Tinea unguium: Secondary | ICD-10-CM | POA: Diagnosis not present

## 2020-09-13 DIAGNOSIS — M79674 Pain in right toe(s): Secondary | ICD-10-CM | POA: Diagnosis not present

## 2020-09-13 DIAGNOSIS — M79675 Pain in left toe(s): Secondary | ICD-10-CM | POA: Diagnosis not present

## 2020-09-13 NOTE — Telephone Encounter (Signed)
Orders for home care nursing have been requested for this patient via Colletta Maryland from [facility]. Please call Colletta Maryland with [facility] to update [phone]. Thank you.

## 2020-09-14 DIAGNOSIS — Z86010 Personal history of colonic polyps: Secondary | ICD-10-CM | POA: Diagnosis not present

## 2020-09-14 DIAGNOSIS — D62 Acute posthemorrhagic anemia: Secondary | ICD-10-CM | POA: Diagnosis not present

## 2020-09-14 DIAGNOSIS — Z9841 Cataract extraction status, right eye: Secondary | ICD-10-CM | POA: Diagnosis not present

## 2020-09-14 DIAGNOSIS — Z9181 History of falling: Secondary | ICD-10-CM | POA: Diagnosis not present

## 2020-09-14 DIAGNOSIS — G8929 Other chronic pain: Secondary | ICD-10-CM | POA: Diagnosis not present

## 2020-09-14 DIAGNOSIS — Z9049 Acquired absence of other specified parts of digestive tract: Secondary | ICD-10-CM | POA: Diagnosis not present

## 2020-09-14 DIAGNOSIS — Z85828 Personal history of other malignant neoplasm of skin: Secondary | ICD-10-CM | POA: Diagnosis not present

## 2020-09-14 DIAGNOSIS — M810 Age-related osteoporosis without current pathological fracture: Secondary | ICD-10-CM | POA: Diagnosis not present

## 2020-09-14 DIAGNOSIS — N183 Chronic kidney disease, stage 3 unspecified: Secondary | ICD-10-CM | POA: Diagnosis not present

## 2020-09-14 DIAGNOSIS — I6529 Occlusion and stenosis of unspecified carotid artery: Secondary | ICD-10-CM | POA: Diagnosis not present

## 2020-09-14 DIAGNOSIS — F17210 Nicotine dependence, cigarettes, uncomplicated: Secondary | ICD-10-CM | POA: Diagnosis not present

## 2020-09-14 DIAGNOSIS — D500 Iron deficiency anemia secondary to blood loss (chronic): Secondary | ICD-10-CM | POA: Diagnosis not present

## 2020-09-14 DIAGNOSIS — I1310 Hypertensive heart and chronic kidney disease without heart failure, with stage 1 through stage 4 chronic kidney disease, or unspecified chronic kidney disease: Secondary | ICD-10-CM | POA: Diagnosis not present

## 2020-09-14 DIAGNOSIS — Z86008 Personal history of in-situ neoplasm of other site: Secondary | ICD-10-CM | POA: Diagnosis not present

## 2020-09-14 DIAGNOSIS — E559 Vitamin D deficiency, unspecified: Secondary | ICD-10-CM | POA: Diagnosis not present

## 2020-09-14 DIAGNOSIS — F329 Major depressive disorder, single episode, unspecified: Secondary | ICD-10-CM | POA: Diagnosis not present

## 2020-09-14 DIAGNOSIS — Z961 Presence of intraocular lens: Secondary | ICD-10-CM | POA: Diagnosis not present

## 2020-09-14 DIAGNOSIS — M545 Low back pain, unspecified: Secondary | ICD-10-CM | POA: Diagnosis not present

## 2020-09-14 NOTE — Telephone Encounter (Signed)
Called and spoke with Colletta Maryland with [facility] regarding the message below.  Informed Stephanie-per Matthew,PA-C-The patient's incision is closed and the patient does not need HH at this time.  Colletta Maryland verbalized understanding and agreed.//AB/CMA

## 2020-09-18 ENCOUNTER — Encounter: Payer: PPO | Admitting: Surgical

## 2020-09-18 ENCOUNTER — Emergency Department: Payer: PPO

## 2020-09-18 ENCOUNTER — Inpatient Hospital Stay
Admission: EM | Admit: 2020-09-18 | Discharge: 2020-09-23 | DRG: 690 | Disposition: A | Discharge status: Skilled Nursing Facility | Payer: PPO | Attending: Internal Medicine | Admitting: Internal Medicine

## 2020-09-18 ENCOUNTER — Other Ambulatory Visit: Payer: Self-pay

## 2020-09-18 DIAGNOSIS — E876 Hypokalemia: Secondary | ICD-10-CM | POA: Diagnosis present

## 2020-09-18 DIAGNOSIS — M50323 Other cervical disc degeneration at C6-C7 level: Secondary | ICD-10-CM | POA: Diagnosis not present

## 2020-09-18 DIAGNOSIS — Y92009 Unspecified place in unspecified non-institutional (private) residence as the place of occurrence of the external cause: Secondary | ICD-10-CM

## 2020-09-18 DIAGNOSIS — M8580 Other specified disorders of bone density and structure, unspecified site: Secondary | ICD-10-CM | POA: Diagnosis present

## 2020-09-18 DIAGNOSIS — J302 Other seasonal allergic rhinitis: Secondary | ICD-10-CM | POA: Diagnosis not present

## 2020-09-18 DIAGNOSIS — M545 Low back pain, unspecified: Secondary | ICD-10-CM | POA: Diagnosis not present

## 2020-09-18 DIAGNOSIS — D62 Acute posthemorrhagic anemia: Secondary | ICD-10-CM | POA: Diagnosis not present

## 2020-09-18 DIAGNOSIS — W19XXXD Unspecified fall, subsequent encounter: Secondary | ICD-10-CM | POA: Diagnosis not present

## 2020-09-18 DIAGNOSIS — D75839 Thrombocytosis, unspecified: Secondary | ICD-10-CM | POA: Diagnosis not present

## 2020-09-18 DIAGNOSIS — K5909 Other constipation: Secondary | ICD-10-CM | POA: Diagnosis not present

## 2020-09-18 DIAGNOSIS — F3289 Other specified depressive episodes: Secondary | ICD-10-CM | POA: Diagnosis not present

## 2020-09-18 DIAGNOSIS — Z9889 Other specified postprocedural states: Secondary | ICD-10-CM | POA: Diagnosis not present

## 2020-09-18 DIAGNOSIS — W19XXXA Unspecified fall, initial encounter: Secondary | ICD-10-CM

## 2020-09-18 DIAGNOSIS — I6529 Occlusion and stenosis of unspecified carotid artery: Secondary | ICD-10-CM | POA: Diagnosis not present

## 2020-09-18 DIAGNOSIS — M6281 Muscle weakness (generalized): Secondary | ICD-10-CM | POA: Diagnosis not present

## 2020-09-18 DIAGNOSIS — M810 Age-related osteoporosis without current pathological fracture: Secondary | ICD-10-CM | POA: Diagnosis not present

## 2020-09-18 DIAGNOSIS — R519 Headache, unspecified: Secondary | ICD-10-CM | POA: Diagnosis not present

## 2020-09-18 DIAGNOSIS — M150 Primary generalized (osteo)arthritis: Secondary | ICD-10-CM | POA: Diagnosis not present

## 2020-09-18 DIAGNOSIS — I10 Essential (primary) hypertension: Secondary | ICD-10-CM | POA: Diagnosis not present

## 2020-09-18 DIAGNOSIS — G8929 Other chronic pain: Secondary | ICD-10-CM | POA: Diagnosis not present

## 2020-09-18 DIAGNOSIS — M25552 Pain in left hip: Secondary | ICD-10-CM | POA: Diagnosis not present

## 2020-09-18 DIAGNOSIS — C4490 Unspecified malignant neoplasm of skin, unspecified: Secondary | ICD-10-CM | POA: Diagnosis present

## 2020-09-18 DIAGNOSIS — N3000 Acute cystitis without hematuria: Secondary | ICD-10-CM | POA: Diagnosis not present

## 2020-09-18 DIAGNOSIS — Z9841 Cataract extraction status, right eye: Secondary | ICD-10-CM | POA: Diagnosis not present

## 2020-09-18 DIAGNOSIS — Z9049 Acquired absence of other specified parts of digestive tract: Secondary | ICD-10-CM | POA: Diagnosis not present

## 2020-09-18 DIAGNOSIS — Z79899 Other long term (current) drug therapy: Secondary | ICD-10-CM | POA: Diagnosis not present

## 2020-09-18 DIAGNOSIS — Z20822 Contact with and (suspected) exposure to covid-19: Secondary | ICD-10-CM | POA: Diagnosis present

## 2020-09-18 DIAGNOSIS — S0990XA Unspecified injury of head, initial encounter: Secondary | ICD-10-CM | POA: Diagnosis present

## 2020-09-18 DIAGNOSIS — K219 Gastro-esophageal reflux disease without esophagitis: Secondary | ICD-10-CM

## 2020-09-18 DIAGNOSIS — R296 Repeated falls: Secondary | ICD-10-CM | POA: Diagnosis present

## 2020-09-18 DIAGNOSIS — S32512A Fracture of superior rim of left pubis, initial encounter for closed fracture: Secondary | ICD-10-CM | POA: Diagnosis not present

## 2020-09-18 DIAGNOSIS — R52 Pain, unspecified: Secondary | ICD-10-CM | POA: Diagnosis not present

## 2020-09-18 DIAGNOSIS — F17210 Nicotine dependence, cigarettes, uncomplicated: Secondary | ICD-10-CM | POA: Diagnosis not present

## 2020-09-18 DIAGNOSIS — R531 Weakness: Secondary | ICD-10-CM | POA: Diagnosis not present

## 2020-09-18 DIAGNOSIS — M47816 Spondylosis without myelopathy or radiculopathy, lumbar region: Secondary | ICD-10-CM | POA: Diagnosis not present

## 2020-09-18 DIAGNOSIS — S7002XA Contusion of left hip, initial encounter: Secondary | ICD-10-CM | POA: Diagnosis present

## 2020-09-18 DIAGNOSIS — Z961 Presence of intraocular lens: Secondary | ICD-10-CM | POA: Diagnosis present

## 2020-09-18 DIAGNOSIS — N390 Urinary tract infection, site not specified: Secondary | ICD-10-CM | POA: Diagnosis not present

## 2020-09-18 DIAGNOSIS — S3992XA Unspecified injury of lower back, initial encounter: Secondary | ICD-10-CM | POA: Diagnosis not present

## 2020-09-18 DIAGNOSIS — C4442 Squamous cell carcinoma of skin of scalp and neck: Secondary | ICD-10-CM | POA: Diagnosis not present

## 2020-09-18 DIAGNOSIS — E568 Deficiency of other vitamins: Secondary | ICD-10-CM | POA: Diagnosis not present

## 2020-09-18 DIAGNOSIS — S199XXA Unspecified injury of neck, initial encounter: Secondary | ICD-10-CM | POA: Diagnosis not present

## 2020-09-18 DIAGNOSIS — W010XXA Fall on same level from slipping, tripping and stumbling without subsequent striking against object, initial encounter: Secondary | ICD-10-CM | POA: Diagnosis present

## 2020-09-18 DIAGNOSIS — G4700 Insomnia, unspecified: Secondary | ICD-10-CM | POA: Diagnosis not present

## 2020-09-18 DIAGNOSIS — N183 Chronic kidney disease, stage 3 unspecified: Secondary | ICD-10-CM | POA: Diagnosis not present

## 2020-09-18 DIAGNOSIS — R1311 Dysphagia, oral phase: Secondary | ICD-10-CM | POA: Diagnosis not present

## 2020-09-18 DIAGNOSIS — I1310 Hypertensive heart and chronic kidney disease without heart failure, with stage 1 through stage 4 chronic kidney disease, or unspecified chronic kidney disease: Secondary | ICD-10-CM | POA: Diagnosis not present

## 2020-09-18 DIAGNOSIS — I672 Cerebral atherosclerosis: Secondary | ICD-10-CM | POA: Diagnosis not present

## 2020-09-18 DIAGNOSIS — E860 Dehydration: Secondary | ICD-10-CM

## 2020-09-18 DIAGNOSIS — R41841 Cognitive communication deficit: Secondary | ICD-10-CM | POA: Diagnosis not present

## 2020-09-18 DIAGNOSIS — R0902 Hypoxemia: Secondary | ICD-10-CM | POA: Diagnosis not present

## 2020-09-18 DIAGNOSIS — S3993XA Unspecified injury of pelvis, initial encounter: Secondary | ICD-10-CM | POA: Diagnosis not present

## 2020-09-18 DIAGNOSIS — M8448XA Pathological fracture, other site, initial encounter for fracture: Secondary | ICD-10-CM | POA: Diagnosis not present

## 2020-09-18 DIAGNOSIS — M50322 Other cervical disc degeneration at C5-C6 level: Secondary | ICD-10-CM | POA: Diagnosis not present

## 2020-09-18 LAB — RESP PANEL BY RT-PCR (FLU A&B, COVID) ARPGX2
Influenza A by PCR: NEGATIVE
Influenza B by PCR: NEGATIVE
SARS Coronavirus 2 by RT PCR: NEGATIVE

## 2020-09-18 LAB — URINALYSIS, COMPLETE (UACMP) WITH MICROSCOPIC
Bilirubin Urine: NEGATIVE
Glucose, UA: NEGATIVE mg/dL
Ketones, ur: NEGATIVE mg/dL
Nitrite: POSITIVE — AB
Protein, ur: NEGATIVE mg/dL
Specific Gravity, Urine: 1.02 (ref 1.005–1.030)
pH: 8.5 — ABNORMAL HIGH (ref 5.0–8.0)

## 2020-09-18 LAB — COMPREHENSIVE METABOLIC PANEL
ALT: 10 U/L (ref 0–44)
AST: 21 U/L (ref 15–41)
Albumin: 3.7 g/dL (ref 3.5–5.0)
Alkaline Phosphatase: 81 U/L (ref 38–126)
Anion gap: 10 (ref 5–15)
BUN: 13 mg/dL (ref 8–23)
CO2: 28 mmol/L (ref 22–32)
Calcium: 9.3 mg/dL (ref 8.9–10.3)
Chloride: 98 mmol/L (ref 98–111)
Creatinine, Ser: 1.13 mg/dL — ABNORMAL HIGH (ref 0.44–1.00)
GFR, Estimated: 48 mL/min — ABNORMAL LOW (ref >60)
Glucose, Bld: 93 mg/dL (ref 70–99)
Potassium: 3.2 mmol/L — ABNORMAL LOW (ref 3.5–5.1)
Sodium: 136 mmol/L (ref 135–145)
Total Bilirubin: 1.4 mg/dL — ABNORMAL HIGH (ref 0.3–1.2)
Total Protein: 6.6 g/dL (ref 6.5–8.1)

## 2020-09-18 LAB — CBC WITH DIFFERENTIAL/PLATELET
Abs Immature Granulocytes: 0.11 10*3/uL — ABNORMAL HIGH (ref 0.00–0.07)
Basophils Absolute: 0.1 10*3/uL (ref 0.0–0.1)
Basophils Relative: 0 %
Eosinophils Absolute: 0.1 10*3/uL (ref 0.0–0.5)
Eosinophils Relative: 0 %
HCT: 43.9 % (ref 36.0–46.0)
Hemoglobin: 13.6 g/dL (ref 12.0–15.0)
Immature Granulocytes: 1 %
Lymphocytes Relative: 6 %
Lymphs Abs: 0.9 10*3/uL (ref 0.7–4.0)
MCH: 23.3 pg — ABNORMAL LOW (ref 26.0–34.0)
MCHC: 31 g/dL (ref 30.0–36.0)
MCV: 75.2 fL — ABNORMAL LOW (ref 80.0–100.0)
Monocytes Absolute: 0.8 10*3/uL (ref 0.1–1.0)
Monocytes Relative: 5 %
Neutro Abs: 14.5 10*3/uL — ABNORMAL HIGH (ref 1.7–7.7)
Neutrophils Relative %: 88 %
Platelets: 759 10*3/uL — ABNORMAL HIGH (ref 150–400)
RBC: 5.84 MIL/uL — ABNORMAL HIGH (ref 3.87–5.11)
RDW: 21 % — ABNORMAL HIGH (ref 11.5–15.5)
WBC: 16.4 10*3/uL — ABNORMAL HIGH (ref 4.0–10.5)
nRBC: 0 % (ref 0.0–0.2)

## 2020-09-18 LAB — TROPONIN I (HIGH SENSITIVITY)
Troponin I (High Sensitivity): 31 ng/L — ABNORMAL HIGH (ref <18)
Troponin I (High Sensitivity): 35 ng/L — ABNORMAL HIGH (ref <18)

## 2020-09-18 LAB — LACTIC ACID, PLASMA: Lactic Acid, Venous: 1.5 mmol/L (ref 0.5–1.9)

## 2020-09-18 MED ORDER — MENTHOL 3 MG MT LOZG
1.0000 | LOZENGE | OROMUCOSAL | Status: DC | PRN
  Administered 2020-09-19: 22:00:00 3 mg via ORAL
  Filled 2020-09-18 (×2): qty 9

## 2020-09-18 MED ORDER — BUPROPION HCL ER (XL) 150 MG PO TB24
300.0000 mg | ORAL_TABLET | Freq: Every day | ORAL | Status: DC
  Administered 2020-09-18 – 2020-09-22 (×5): 300 mg via ORAL
  Filled 2020-09-18 (×5): qty 2

## 2020-09-18 MED ORDER — VITAMIN D 25 MCG (1000 UNIT) PO TABS
2000.0000 [IU] | ORAL_TABLET | Freq: Every day | ORAL | Status: DC
  Administered 2020-09-19 – 2020-09-23 (×5): 2000 [IU] via ORAL
  Filled 2020-09-18 (×4): qty 2

## 2020-09-18 MED ORDER — PANTOPRAZOLE SODIUM 40 MG PO TBEC
40.0000 mg | DELAYED_RELEASE_TABLET | Freq: Every day | ORAL | Status: DC
  Administered 2020-09-19 – 2020-09-23 (×5): 40 mg via ORAL
  Filled 2020-09-18 (×5): qty 1

## 2020-09-18 MED ORDER — ONDANSETRON HCL 4 MG/2ML IJ SOLN: 4.0000 mg | Freq: Four times a day (QID) | INTRAMUSCULAR | Status: DC | PRN

## 2020-09-18 MED ORDER — LORATADINE 10 MG PO TABS
10.0000 mg | ORAL_TABLET | Freq: Every day | ORAL | Status: DC
  Administered 2020-09-19 – 2020-09-23 (×5): 10 mg via ORAL
  Filled 2020-09-18 (×5): qty 1

## 2020-09-18 MED ORDER — ONDANSETRON HCL 4 MG PO TABS: 4.0000 mg | ORAL_TABLET | Freq: Four times a day (QID) | ORAL | Status: DC | PRN

## 2020-09-18 MED ORDER — ACETAMINOPHEN 325 MG PO TABS
650.0000 mg | ORAL_TABLET | Freq: Four times a day (QID) | ORAL | Status: DC | PRN
  Administered 2020-09-20 – 2020-09-23 (×5): 650 mg via ORAL
  Filled 2020-09-18 (×4): qty 2

## 2020-09-18 MED ORDER — AMLODIPINE BESYLATE 5 MG PO TABS
2.5000 mg | ORAL_TABLET | Freq: Every day | ORAL | Status: DC
  Administered 2020-09-19 – 2020-09-23 (×5): 2.5 mg via ORAL
  Filled 2020-09-18 (×5): qty 1

## 2020-09-18 MED ORDER — SODIUM CHLORIDE 0.9 % IV BOLUS
500.0000 mL | Freq: Once | INTRAVENOUS | Status: AC
  Administered 2020-09-18: 500 mL via INTRAVENOUS

## 2020-09-18 MED ORDER — ACETAMINOPHEN 650 MG RE SUPP: 650.0000 mg | Freq: Four times a day (QID) | RECTAL | Status: DC | PRN

## 2020-09-18 MED ORDER — ACETAMINOPHEN 325 MG PO TABS
650.0000 mg | ORAL_TABLET | Freq: Once | ORAL | Status: AC
  Administered 2020-09-18: 650 mg via ORAL
  Filled 2020-09-18: qty 2

## 2020-09-18 MED ORDER — GUAIFENESIN 100 MG/5ML PO SOLN
5.0000 mL | ORAL | Status: DC | PRN
  Filled 2020-09-18: qty 5

## 2020-09-18 MED ORDER — TRAZODONE HCL 50 MG PO TABS
50.0000 mg | ORAL_TABLET | Freq: Every day | ORAL | Status: DC
  Administered 2020-09-18 – 2020-09-22 (×5): 50 mg via ORAL
  Filled 2020-09-18 (×5): qty 1

## 2020-09-18 MED ORDER — SODIUM CHLORIDE 0.9 % IV SOLN
1.0000 g | INTRAVENOUS | Status: DC
  Administered 2020-09-18 – 2020-09-20 (×3): 1 g via INTRAVENOUS
  Filled 2020-09-18: qty 10
  Filled 2020-09-18 (×2): qty 1
  Filled 2020-09-18: qty 10

## 2020-09-18 MED ORDER — POTASSIUM CHLORIDE IN NACL 40-0.9 MEQ/L-% IV SOLN
INTRAVENOUS | Status: DC
  Filled 2020-09-18 (×5): qty 1000

## 2020-09-18 MED ORDER — MELATONIN 5 MG PO TABS
2.5000 mg | ORAL_TABLET | Freq: Every day | ORAL | Status: DC
  Administered 2020-09-18 – 2020-09-22 (×5): 2.5 mg via ORAL
  Filled 2020-09-18 (×7): qty 0.5

## 2020-09-18 NOTE — ED Notes (Signed)
Ashley RN aware of assigned bed 

## 2020-09-18 NOTE — Progress Notes (Signed)
PT Cancellation Note  Patient Details Name: Tina Ingram MRN: [id number] DOB: 02-04-1934   Cancelled Treatment:    Reason Eval/Treat Not Completed: Other (comment). Pt received supine in bed, appears to be HOH. Therapist introduced herself and explained PT role - pt immediately refused stating she must eat before participating. A snack was offered, pt stated she needs a full meal. Will continue to follow and re-attempt PT eval at a later date.    Patrina Levering PT, DPT 09/18/20 4:04 PM [phone]

## 2020-09-18 NOTE — ED Notes (Signed)
Dinner tray given at this time.  

## 2020-09-18 NOTE — ED Notes (Signed)
PT at bedside.

## 2020-09-18 NOTE — H&P (Signed)
History and Physical    Tina Ingram [id number] DOB: September 21, 1934 DOA: 09/18/2020  PCP: Derinda Late, MD  Patient coming from: Home    Chief Complaint: fall  HPI: Tina Ingram is a 86 y.o. female with medical history significant of scalp cancer recent really removed presents via EMS status post fall.  Patient was trying to get up from sitting and standing fell onto her left hip and complains of left hip pain along with some headache..  Patient not on any anticoagulation or antiplatelets.  Patient denies any loss of consciousness.  Blood pressure labile in the ED 100s over 180s over 70s over 80s.  Potassium 3.2 creatinine is 1.13 from 1.49T bili 1.4 GFR 48.  White count of 16 H&H 13 and 43 platelets 759.  Normal sinus rhythm.  CT of the head showing interval large right frontoparietal infarct likely chronic or subacute given evidence of volume loss.  Cervical spine disc disease but no acute fracture.  Lumbar spine CT chronic angulated fracture of S3 vertebral body S-shaped lumbar curvature MRI could further indicate canal foramina if indicated.  Osteopenia.  No acute fracture on pelvis CT.  UA is positive for nitrites trace leuks.  Patient given Tylenol and 500cc ns in ed. EKG normal sinus.  Patient sister at bedside who provide history of Tina Ingram.  Patient states she was trying to open a sliding door and fell lost her balance fell backwards.  Patient does endorse some urgency and frequency but no dysuria.  Patient is unsure about odor but per nursing staff note review from ED provider nursing staff was concerned for darker concentrated strong odor urine.  ED Course: As above  Review of Systems: As per HPI otherwise all other systems reviewed and are negative.   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 vertex scalp, nasal tip    Past Surgical History:  Procedure Laterality Date   APPENDECTOMY     BREAST BIOPSY Left 05/16/2014   complex sclerosing lesion    BREAST EXCISIONAL BIOPSY Left 2016   surgical exc to remove complex sclerosing lesion   BREAST LUMPECTOMY WITH RADIOACTIVE SEED LOCALIZATION Left 06/20/2014   Procedure: BREAST LUMPECTOMY WITH RADIOACTIVE SEED LOCALIZATION;  Surgeon: Erroll Luna, MD;  Location: [place];  Service: General;  Laterality: Left;   CAROTID ENDARTERECTOMY Right    CATARACT EXTRACTION W/PHACO Right 06/25/2015   Procedure: CATARACT EXTRACTION PHACO AND INTRAOCULAR LENS PLACEMENT ([place]);  Surgeon: Estill Cotta, MD;  Location: [facility];  Service: Ophthalmology;  Laterality: Right;  [place] 2.01 AP% 24.5 CDE 52.16 Fluid pack lot # [id number]   COLONOSCOPY     COLONOSCOPY WITH PROPOFOL N/A 07/07/2020   Procedure: COLONOSCOPY WITH PROPOFOL;  Surgeon: Lesly Rubenstein, MD;  Location: [facility];  Service: Endoscopy;  Laterality: N/A;   ESOPHAGOGASTRODUODENOSCOPY N/A 07/10/2020   Procedure: ESOPHAGOGASTRODUODENOSCOPY (EGD);  Surgeon: Jonathon Bellows, MD;  Location: [facility];  Service: Gastroenterology;  Laterality: N/A;   ESOPHAGOGASTRODUODENOSCOPY (EGD) WITH PROPOFOL N/A 07/06/2020   Procedure: ESOPHAGOGASTRODUODENOSCOPY (EGD) WITH PROPOFOL;  Surgeon: [name], MD;  Location: [facility] END[facility]COPY;  Service: Endoscopy;  Laterality: N/A;   EYE SURGERY  KYPHOPLASTY N/A 01/27/2017   Procedure: [id number];  Surgeon: Hessie Knows, MD;  Location: [facility];  Service: Orthopedics;  Laterality: N/A;  T-9     Social History  reports that she has been smoking cigarettes. She has been smoking an average of 1 pack per day. She has never used smokeless tobacco. She reports that she does not drink alcohol and does not use drugs.  No Known Allergies  Family History   Problem Relation Age of Onset   Breast cancer Neg Hx     Prior to Admission medications   Medication Sig Start Date End Date Taking? Authorizing Provider  amLODipine (NORVASC) 2.5 MG tablet Take 2.5 mg by mouth daily.    [provider]  buPROPion (WELLBUTRIN XL) 300 MG 24 hr tablet Take 300 mg by mouth at bedtime. 07/18/20   [provider]  cetirizine (ZYRTEC) 10 MG tablet Take 10 mg by mouth daily.    [provider]  Cholecalciferol (VITAMIN D) 50 MCG (2000 UT) tablet Take 2,000 Units by mouth daily.    [provider]  diclofenac Sodium (VOLTAREN) 1 % GEL Apply 1 application topically 4 (four) times daily as needed (pain).    [provider]  hydrochlorothiazide (HYDRODIURIL) 12.5 MG tablet Take 12.5 mg by mouth daily.    [provider]  melatonin 3 MG TABS tablet Take 3 mg by mouth at bedtime.    [provider]  pantoprazole (PROTONIX) 40 MG tablet Take 40 mg by mouth daily.    [provider]  traZODone (DESYREL) 50 MG tablet Take 50 mg by mouth at bedtime. 12/25/18   [provider]    Physical Exam: Vitals:   09/18/20 0909 09/18/20 0911 09/18/20 1123 09/18/20 1130  BP:  (!) 181/85 104/76 (!) 177/85  Pulse:   (!) 101   Resp:   15   Temp:      TempSrc:      SpO2:   96%   Weight: 50 kg     Height: '5\' 5"'$  (1.651 m)       Constitutional: NAD, calm, comfortable Vitals:   09/18/20 0909 09/18/20 0911 09/18/20 1123 09/18/20 1130  BP:  (!) 181/85 104/76 (!) 177/85  Pulse:   (!) 101   Resp:   15   Temp:      TempSrc:      SpO2:   96%   Weight: 50 kg     Height: '5\' 5"'$  (1.651 m)      Eyes: PERRL, lids and conjunctivae normal ENMT: Mucous membranes are moist. Posterior pharynx clear of any exudate or lesions.Normal dentition.  Neck: normal, supple, no masses, no thyromegaly Respiratory: clear to auscultation bilaterally, no wheezing, no crackles. Normal respiratory effort. No accessory muscle  use.  Cardiovascular: Regular rate and rhythm, no murmurs / rubs / gallops. No extremity edema. 2+ pedal pulses. Abdomen: no tenderness, no masses palpated. No hepatosplenomegaly. Bowel sounds positive.  Musculoskeletal: no clubbing / cyanosis. No joint deformity upper and lower extremities. left hip non-tender to palpation, moves all 4 extremities spontaneously Skin: scalp lesion-from prior cancer removed. No induration Neurologic: CN 2-12 grossly intact. Sensation intact,Strength 3/5 in all 4.  Psychiatric: Normal judgment and insight. Alert and oriented x 3. Normal mood.     Labs on Admission: I have personally reviewed following labs and imaging studies  CBC: Recent Labs  Lab 09/18/20 1040  WBC 16.4*  NEUTROABS 14.5*  HGB 13.6  HCT 43.9  MCV 75.2*  PLT  759*    Basic Metabolic Panel: Recent Labs  Lab 09/18/20 1040  NA 136  K 3.2*  CL 98  CO2 28  GLUCOSE 93  BUN 13  CREATININE 1.13*  CALCIUM 9.3    GFR: Estimated Creatinine Clearance: 29.8 mL/min (A) (by C-G formula based on SCr of 1.13 mg/dL (H)).  Liver Function Tests: Recent Labs  Lab 09/18/20 1040  AST 21  ALT 10  ALKPHOS 81  BILITOT 1.4*  PROT 6.6  ALBUMIN 3.7    Urine analysis:    Component Value Date/Time   COLORURINE STRAW (A) 09/18/2020 1040   APPEARANCEUR CLEAR 09/18/2020 1040   LABSPEC 1.020 09/18/2020 1040   PHURINE 8.5 (H) 09/18/2020 1040   GLUCOSEU NEGATIVE 09/18/2020 1040   HGBUR MODERATE (A) 09/18/2020 1040   BILIRUBINUR NEGATIVE 09/18/2020 1040   [place] 09/18/2020 1040   PROTEINUR NEGATIVE 09/18/2020 1040   NITRITE POSITIVE (A) 09/18/2020 1040   LEUKOCYTESUR TRACE (A) 09/18/2020 1040    Radiological Exams on Admission: CT HEAD WO CONTRAST (5MM)  Result Date: 09/18/2020 CLINICAL DATA:  CT Head 01/15/2019. EXAM: CT HEAD WITHOUT CONTRAST TECHNIQUE: Contiguous axial images were obtained from the base of the skull through the vertex without intravenous contrast.  COMPARISON:  None. FINDINGS: Brain: New large area of hypoattenuation and loss of gray-white differentiation in the posterior right frontal and parietal lobes, compatible with interval infarct. There is evidence of volume loss with mild ex vacuo ventricular dilation of the right posterolateral ventricle and temporal horn. No evidence of acute hemorrhage. No sizable extra-axial fluid collection. Postoperative changes of prior right frontal craniotomy. No hydrocephalus, midline shift, or mass lesion. Vascular: No hyperdense vessel identified. Calcific intracranial atherosclerosis. Skull: Postoperative changes of prior right frontal craniotomy. Sinuses/Orbits: Probable retention cysts in the right frontal sinus and left sphenoid sinus. Otherwise, sinuses are largely clear. No acute orbital findings. Other: Moderate right mastoid effusion. IMPRESSION: 1. 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 If there is concern for recent infarct, recommend MRI. 2. Otherwise, no evidence of acute intracranial abnormality. Electronically Signed   By: Margaretha Sheffield M.D.   On: 09/18/2020 10:15   CT Cervical Spine Wo Contrast  Result Date: 09/18/2020 CLINICAL DATA:  Neck trauma (Age >= 65y) EXAM: CT CERVICAL SPINE WITHOUT CONTRAST TECHNIQUE: Multidetector CT imaging of the cervical spine was performed without intravenous contrast. Multiplanar CT image reconstructions were also generated. COMPARISON:  None.  CT cervical spine 12/30/2018. FINDINGS: Alignment: Similar alignment. Similar straightening of the normal cervical lordosis and mild anterolisthesis of C4 on C5. Skull base and vertebrae: No evidence of acute fracture. Vertebral body heights are similar to the prior. Soft tissues and spinal canal: No prevertebral fluid or swelling. No visible canal hematoma. Disc levels: Similar multilevel degenerative disc disease, greatest at C5-C6 and  C6-C7 where there is disc height loss and endplate spurring. Upper chest: Visualized lung apices are clear. Other: Calcific atherosclerosis of the carotids. IMPRESSION: 1. No evidence of acute fracture or traumatic malalignment. 2. Similar multilevel degenerative disc disease. Electronically Signed   By: Margaretha Sheffield M.D.   On: 09/18/2020 10:20   CT LUMBAR SPINE WO CONTRAST  Result Date: 09/18/2020 CLINICAL DATA:  Pelvic trauma EXAM: CT LUMBAR SPINE WITHOUT CONTRAST TECHNIQUE: Multidetector CT imaging of the lumbar spine was performed without intravenous contrast administration. Multiplanar CT image reconstructions were also generated. COMPARISON:  Lumbar radiographs 12/30/2018. FINDINGS: Segmentation: 5 non rib-bearing lumbar vertebral  bodies. Alignment: S shaped lumbar curvature (Dextrocurvature of the upper lumbar spine with levocurvature of the lower lumbar spine). No substantial sagittal subluxation. Vertebrae: No evidence of acute fracture. Chronic angulated fracture of the S3 vertebral body, partially imaged. Paraspinal and other soft tissues: Calcific atherosclerosis of the aorta and branch vessels. Poorly evaluated hypodense right renal lesion, possibly a cyst. Disc levels: Multilevel facet arthropathy/hypertrophy, greatest in the lower lumbar spine. Posterior disc bulges at multiple levels. IMPRESSION: 1. No evidence of acute fracture or traumatic malalignment. 2. Chronic angulated fracture of the S3 vertebral body, partially imaged. 3. S-shaped lumbar curvature with multilevel facet arthropathy/hypertrophy. MRI could further evaluate the canal/foramina if clinically indicated. 4. Osteopenia. Electronically Signed   By: Margaretha Sheffield M.D.   On: 09/18/2020 10:29   CT PELVIS WO CONTRAST  Result Date: 09/18/2020 CLINICAL DATA:  Pelvic trauma, fall, left hip pain EXAM: CT PELVIS WITHOUT CONTRAST TECHNIQUE: Multidetector CT imaging of the pelvis was performed following the standard protocol  without intravenous contrast. COMPARISON:  06/12/2011 FINDINGS: Urinary Tract:  No abnormality visualized. Bowel: Unremarkable visualized pelvic bowel loops. Status post appendectomy. Vascular/Lymphatic: No pathologically enlarged lymph nodes. Aortic atherosclerosis Reproductive:  No mass or other significant abnormality Other:  None. Musculoskeletal: Osteopenia. Chronic fracture deformities of the left inferior pubic ramus (series 3, image 42). Nonacute, angulated fracture of the S3 segment (series 6, image 72). IMPRESSION: 1. Osteopenia. 2. Chronic fracture deformities of the left inferior pubic ramus. 3. Chronic, angulated fracture of the S3 segment. 4. No acute displaced fracture. Aortic Atherosclerosis (ICD10-I70.0). Electronically Signed   By: Eddie Candle M.D.   On: 09/18/2020 10:12    EKG: Independently reviewed.   Assessment/Plan Principal Problem:   UTI (urinary tract infection) Active Problems:   Hypokalemia   Essential hypertension   Skin cancer   Fall at home, initial encounter   Generalized weakness   GERD without esophagitis 86 year old female presents with multiple falls found to have UTI and hypokalemia. UTI-we will give Rocephin and follow-up urine culture  Multiple falls-likely secondary to deconditioning, from UTI will get PT and OT consult Fall precautions and neurochecks  Hypokalemia-replace and IV follow-up when labs  Leukocytosis-secondary to UTI, continue to monitor  Hypertension-resume home Norvasc but hold HCTZ due to hypokalemia  GERD-give home PPI  Skin lesion/cancer-scab in place on scalp-wound consult, patient is placed in Vaseline on it at home  Disposition-admit patient, telemetry, continue to monitor Patient is agreeable to PT and OT consult for evaluation for potential rehab if warranted  DVT prophylaxis: SCD Code Status:   DNI Family Communication:  Sister Lindwood Ingram at bedside, patient's emergency contact is Shavontae Wilcoxson at [phone] which is  her grandson. Disposition Plan:   Patient is from:  Home  Anticipated DC to:  Home  Anticipated DC date:  To be determined  Anticipated DC barriers: strength Consults called:  na Admission status:  Inpatient  Severity of Illness: The appropriate patient status for this patient is INPATIENT. Inpatient status is judged to be reasonable and necessary in order to provide the required intensity of service to ensure the patient's safety. The patient's presenting symptoms, physical exam findings, and initial radiographic and laboratory data in the context of their chronic comorbidities is felt to place them at high risk for further clinical deterioration. Furthermore, it is not anticipated that the patient will be medically stable for discharge from the hospital within 2 midnights of admission. The following factors support the patient status of inpatient.   " The  patient's presenting symptoms include as above. " The worrisome physical exam findings include as above. " The initial radiographic and laboratory data are worrisome because of as above. " The chronic co-morbidities include as above.   * I certify that at the point of admission it is my clinical judgment that the patient will require inpatient hospital care spanning beyond 2 midnights from the point of admission due to high intensity of service, high risk for further deterioration and high frequency of surveillance required.Jacqlyn Krauss MD [facility] [phone] How to contact the [facility] Attending or Consulting provider [place] or covering provider during after hours [place], for this patient?   Check the care team in [facility] and look for a) attending/consulting [facility] provider listed and b) the [facility] team listed Log into [email or web address] and use [place] universal password to access. If you do not have the password, please contact the hospital operator. Locate the [facility] provider you are looking for under [facility] and  page to a number that you can be directly reached. If you still have difficulty reaching the provider, please page the [facility] (Director on Call) for the Hospitalists listed on amion for assistance.  09/18/2020, 1:40 PM

## 2020-09-18 NOTE — Consult Note (Signed)
[facility] Nurse Consult Note: Reason for Consult: Care guidance requested for scalp lesion, site of debridement and application of Acell by Plastic Surgeon, Dr C. Dillingham one week ago. Wound type: neoplastic Pressure Injury POA: N/A  I have provided guidance for nursing from Dr. Eusebio Friendly post-op note, specifically:  "Apply K-Y jelly to the adaptic dressing every other day, cover with dry 4x4 inch gauze. Secure with Medipore tape and keep in place with Kerlix roll gauze wrapped around head".   Call Dr. Guadlupe Spanish or her PA-C M. Scheeler, if any questions.  [facility] nursing team will not follow, but will remain available to this patient, the nursing and medical teams.  Please re-consult if needed. Thanks, Maudie Flakes, MSN, RN, [place], Ar[facility]  Pager# [phone]

## 2020-09-18 NOTE — ED Provider Notes (Signed)
[facility] Emergency Department Provider Note  ____________________________________________   Event Date/Time   First MD Initiated Contact with Patient 09/18/20 [phone]     (approximate)  I have reviewed the triage vital signs and the nursing notes.   HISTORY  Chief Complaint Fall    HPI Tina Ingram is a 86 y.o. female presents emergency department via EMS after a fall.  Patient was getting ready to go to an appointment when she fell from standing position.  Landed on the left hip.  Patient is complaining of left hip pain, lower back pain, some headache.  Patient is not on a blood thinner.  Patient recently had a area of skin cancer removed from her scalp.  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   left vertex scalp, nasal tip    Patient Active Problem List   Diagnosis Date Noted   Skin cancer 09/10/2020   Symptomatic anemia    Iron deficiency anemia due to chronic blood loss    Hypokalemia    AKI (acute kidney injury) ([place])    Essential hypertension    Tobacco dependence    GI bleed 07/05/2020    Past Surgical History:  Procedure Laterality Date   APPENDECTOMY     BREAST BIOPSY Left 05/16/2014   complex sclerosing lesion    BREAST EXCISIONAL BIOPSY Left 2016   surgical exc to remove complex sclerosing lesion   BREAST LUMPECTOMY WITH RADIOACTIVE SEED LOCALIZATION Left 06/20/2014   Procedure: BREAST LUMPECTOMY WITH RADIOACTIVE SEED LOCALIZATION;  Surgeon: Erroll Luna, MD;  Location: [place];  Service: General;  Laterality: Left;   CAROTID ENDARTERECTOMY Right    CATARACT EXTRACTION W/PHACO Right 06/25/2015   Procedure: CATARACT EXTRACTION PHACO AND INTRAOCULAR LENS  PLACEMENT ([place]);  Surgeon: Estill Cotta, MD;  Location: [facility];  Service: Ophthalmology;  Laterality: Right;  [place] 2.01 AP% 24.5 CDE 52.16 Fluid pack lot # [id number]   COLONOSCOPY     COLONOSCOPY WITH PROPOFOL N/A 07/07/2020   Procedure: COLONOSCOPY WITH PROPOFOL;  Surgeon: Lesly Rubenstein, MD;  Location: [facility];  Service: Endoscopy;  Laterality: N/A;   ESOPHAGOGASTRODUODENOSCOPY N/A 07/10/2020   Procedure: ESOPHAGOGASTRODUODENOSCOPY (EGD);  Surgeon: Jonathon Bellows, MD;  Location[facility] END[facility]COPY;  Service: Gastroenterology;  Laterality: N/A;   ESOPHAGOGASTRODUODENOSCOPY (EGD) WITH PROPOFOL N/A 07/06/2020   Procedure: ESOPHAGOGASTRODUODENOSCOPY (EGD) WITH PROPOFOL;  Surgeon: [name], MD;  Location: [facility] ENDOSCOPY;  Service: Endoscopy;  Laterality: N/A;   EYE SURGERY     KYPHOPLASTY N/A 01/27/2017   Procedure: [id number];  Surgeon: Hessie Knows, MD;  Location: [facility];  Service: Orthopedics;  Laterality: N/A;  T-9     Prior to Admission medications   Medication Sig Start Date End Date Taking? Authorizing Provider  amLODipine (NORVASC) 2.5 MG tablet Take 2.5 mg by mouth daily.    [provider]  buPROPion (WELLBUTRIN XL) 300 MG 24 hr tablet Take 300 mg by mouth at bedtime. 07/18/20   [provider]  cetirizine (ZYRTEC) 10 MG tablet Take 10 mg by mouth daily.    [provider]  Cholecalciferol (VITAMIN D) 50 MCG (2000 UT) tablet Take 2,000 Units by mouth daily.  [provider]  diclofenac Sodium (VOLTAREN) 1 % GEL Apply 1 application topically 4 (four) times daily as needed (pain).    [provider]  hydrochlorothiazide (HYDRODIURIL) 12.5 MG tablet Take 12.5 mg by mouth daily.    [provider]  melatonin 3 MG TABS tablet Take 3 mg by mouth at bedtime.    [provider]  pantoprazole (PROTONIX) 40 MG tablet Take 40 mg by mouth daily.    [provider]  traZODone (DESYREL) 50 MG tablet Take 50  mg by mouth at bedtime. 12/25/18   [provider]    Allergies Patient has no known allergies.  Family History  Problem Relation Age of Onset   Breast cancer Neg Hx     Social History Social History   Tobacco Use   Smoking status: Every Day    Packs/day: 1.00    Types: Cigarettes   Smokeless tobacco: Never  Vaping Use   Vaping Use: Never used  Substance Use Topics   Alcohol use: No   Drug use: Never    Review of Systems  Constitutional: No fever/chills Eyes: No visual changes. ENT: No sore throat. Respiratory: Denies cough Cardiovascular: Denies chest pain Gastrointestinal: Denies abdominal pain Genitourinary: Negative for dysuria. Musculoskeletal: Positive for back pain.  Left hip pain Skin: Negative for rash. Psychiatric: no mood changes,     ____________________________________________   PHYSICAL EXAM:  VITAL SIGNS: ED Triage Vitals  Enc Vitals Group     BP 09/18/20 0911 (!) 181/85     Pulse Rate 09/18/20 0908 90     Resp 09/18/20 0908 16     Temp 09/18/20 0908 97.8 F (36.6 C)     Temp Source 09/18/20 0908 Oral     SpO2 09/18/20 0908 91 %     Weight 09/18/20 0909 110 lb 3.7 oz (50 kg)     Height 09/18/20 0909 '5\' 5"'$  (1.651 m)     Head Circumference --      [place] Flow --      Pain Score 09/18/20 0909 10     Pain Loc --      Pain Edu? --      Excl. in [place]? --     Constitutional: Alert and oriented. Well appearing and in no acute distress. Eyes: Conjunctivae are normal.  Head: Atraumatic.  Tender to palpation Nose: No congestion/rhinnorhea. Mouth/Throat: Mucous membranes are moist.   Neck:  supple no lymphadenopathy noted Cardiovascular: Normal rate, regular rhythm.  Respiratory: Normal respiratory effort.  No retractions, Abd: soft nontender bs normal all 4 quad GU: deferred Musculoskeletal: Patient is lying flat on stretcher and she states she is too weak to sit up without falling over, lumbar spine tender to palpation, left hip  very tender to palpation, no leg shortening or rotation noted, C-spine slightly tender and skull slightly tender neurologic:  Normal speech and language.  Skin:  Skin is warm, dry and intact. No rash noted. Psychiatric: Mood and affect are normal. Speech and behavior are normal.  ____________________________________________   LABS (all labs ordered are listed, but only abnormal results are displayed)  Labs Reviewed  COMPREHENSIVE METABOLIC PANEL - Abnormal; Notable for the following components:      Result Value   Potassium 3.2 (*)    Creatinine, Ser 1.13 (*)    Total Bilirubin 1.4 (*)    GFR, Estimated 48 (*)    All other components within normal limits  CBC WITH DIFFERENTIAL/PLATELET - Abnormal; Notable for the following  components:   WBC 16.4 (*)    RBC 5.84 (*)    MCV 75.2 (*)    MCH 23.3 (*)    RDW 21.0 (*)    Platelets 759 (*)    Neutro Abs 14.5 (*)    Abs Immature Granulocytes 0.11 (*)    All other components within normal limits  URINALYSIS, COMPLETE (UACMP) WITH MICROSCOPIC - Abnormal; Notable for the following components:   Color, Urine STRAW (*)    pH 8.5 (*)    Hgb urine dipstick MODERATE (*)    Nitrite POSITIVE (*)    Leukocytes,Ua TRACE (*)    Bacteria, UA RARE (*)    All other components within normal limits  TROPONIN I (HIGH SENSITIVITY) - Abnormal; Notable for the following components:   Troponin I (High Sensitivity) 31 (*)    All other components within normal limits  RESP PANEL BY RT-PCR (FLU A&B, COVID) ARPGX2  LACTIC ACID, PLASMA  TROPONIN I (HIGH SENSITIVITY)   ____________________________________________   ____________________________________________  RADIOLOGY  CT of the head, C-spine, pelvis  ____________________________________________   PROCEDURES  Procedure(s) performed: No  Procedures    ____________________________________________   INITIAL IMPRESSION / ASSESSMENT AND PLAN / ED COURSE  Pertinent labs & imaging results  that were available during my care of the patient were reviewed by me and considered in my medical decision making (see chart for details).   Patient is an 86 year old female presents emergency department after a fall.  See HPI.  Physical exam shows patient be stable at this time  DDx: Left hip fracture, pelvis fracture, C-spine fracture, lumbar spine fracture, SAH, subdural hematoma  CT of the head, C-spine, pelvis ordered.  CT lumbar spine  Labs ordered due to the patient's weakness, nursing staff noticed that her urine smelled very strong, patient was confused when she felt her nipple that she did not know what it was.  Family is concerned due to the weakness and number of falls.  If she does not meet admission criteria they would like for her to stay and be placed in a facility.  EKG shows normal sinus rhythm, see physician read  Labs show that the patient is slightly dehydrated, comprehensive metabolic panel has elevated creatinine of 1.13, troponin is elevated at 31, urinalysis does show nitrites and leuks, CBC does have elevated WBC of 16.4  CTs are reassuring, there are no new fractures or abnormalities noted on the CTs, this was reviewed by me and confirmed by radiology.  Consult to hospitalist, Dr. Dwyane Dee to see the patient and admit COVID test ordered    Guerline S Frink was evaluated in Emergency Department on 09/18/2020 for the symptoms described in the history of present illness. She was evaluated in the context of the global COVID-19 pandemic, which necessitated consideration that the patient might be at risk for infection with the SARS-CoV-2 virus that causes COVID-19. Institutional protocols and algorithms that pertain to the evaluation of patients at risk for COVID-19 are in a state of rapid change based on information released by regulatory bodies including the CDC and federal and state organizations. These policies and algorithms were followed during the patient's care in the  ED.    As part of my medical decision making, I reviewed the following data within the [place] History obtained from family, Nursing notes reviewed and incorporated, Labs reviewed , EKG interpreted NSR, Old chart reviewed, Radiograph reviewed , Discussed with admitting physician , Notes from prior ED visits, and [place]  Controlled Substance Database  ____________________________________________   FINAL CLINICAL IMPRESSION(S) / ED DIAGNOSES  Final diagnoses:  Fall, initial encounter  Minor head injury, initial encounter  Contusion of left hip, initial encounter  Weakness  Dehydration      NEW MEDICATIONS STARTED DURING THIS VISIT:  New Prescriptions   No medications on file     Note:  This document was prepared using Dragon voice recognition software and may include unintentional dictation errors.    Versie Starks, PA-C 09/18/20 1317    Carrie Mew, MD 09/18/20 [phone]

## 2020-09-18 NOTE — ED Triage Notes (Addendum)
Pt to ER via [facility] from home. Pt reports while getting ready for an appointment this morning she had a mechanical fall from standing height into her closet. Reports left hip pain when sitting/ with palpation. Reports hitting her head on the sliding door. Denies LOC/ blood thinner usage.   Pt has staples present to head from recent skin cancer removal. Has not taken daily medications yet this morning.  EMS VSS- BP 150/90, hr 84, O2 sats 94% RA.

## 2020-09-18 NOTE — ED Notes (Signed)
Patient assisted into clean brief and gown by this Rn and Caitlyn EDT

## 2020-09-19 DIAGNOSIS — I10 Essential (primary) hypertension: Secondary | ICD-10-CM

## 2020-09-19 DIAGNOSIS — W19XXXA Unspecified fall, initial encounter: Secondary | ICD-10-CM

## 2020-09-19 DIAGNOSIS — R531 Weakness: Secondary | ICD-10-CM

## 2020-09-19 DIAGNOSIS — D75839 Thrombocytosis, unspecified: Secondary | ICD-10-CM

## 2020-09-19 DIAGNOSIS — N3000 Acute cystitis without hematuria: Secondary | ICD-10-CM

## 2020-09-19 DIAGNOSIS — N390 Urinary tract infection, site not specified: Principal | ICD-10-CM

## 2020-09-19 DIAGNOSIS — Y92009 Unspecified place in unspecified non-institutional (private) residence as the place of occurrence of the external cause: Secondary | ICD-10-CM

## 2020-09-19 LAB — COMPREHENSIVE METABOLIC PANEL
ALT: 8 U/L (ref 0–44)
AST: 15 U/L (ref 15–41)
Albumin: 3 g/dL — ABNORMAL LOW (ref 3.5–5.0)
Alkaline Phosphatase: 60 U/L (ref 38–126)
Anion gap: 6 (ref 5–15)
BUN: 11 mg/dL (ref 8–23)
CO2: 26 mmol/L (ref 22–32)
Calcium: 8.7 mg/dL — ABNORMAL LOW (ref 8.9–10.3)
Chloride: 107 mmol/L (ref 98–111)
Creatinine, Ser: 1.11 mg/dL — ABNORMAL HIGH (ref 0.44–1.00)
GFR, Estimated: 49 mL/min — ABNORMAL LOW (ref >60)
Glucose, Bld: 61 mg/dL — ABNORMAL LOW (ref 70–99)
Potassium: 4 mmol/L (ref 3.5–5.1)
Sodium: 139 mmol/L (ref 135–145)
Total Bilirubin: 1.2 mg/dL (ref 0.3–1.2)
Total Protein: 5.6 g/dL — ABNORMAL LOW (ref 6.5–8.1)

## 2020-09-19 LAB — CBC
HCT: 36.2 % (ref 36.0–46.0)
Hemoglobin: 11.6 g/dL — ABNORMAL LOW (ref 12.0–15.0)
MCH: 23.8 pg — ABNORMAL LOW (ref 26.0–34.0)
MCHC: 32 g/dL (ref 30.0–36.0)
MCV: 74.2 fL — ABNORMAL LOW (ref 80.0–100.0)
Platelets: 627 10*3/uL — ABNORMAL HIGH (ref 150–400)
RBC: 4.88 MIL/uL (ref 3.87–5.11)
RDW: 20.7 % — ABNORMAL HIGH (ref 11.5–15.5)
WBC: 13.7 10*3/uL — ABNORMAL HIGH (ref 4.0–10.5)
nRBC: 0 % (ref 0.0–0.2)

## 2020-09-19 MED ORDER — ENOXAPARIN SODIUM 40 MG/0.4ML IJ SOSY: 40.0000 mg | PREFILLED_SYRINGE | INTRAMUSCULAR | Status: DC

## 2020-09-19 MED ORDER — ENSURE ENLIVE PO LIQD
237.0000 mL | Freq: Two times a day (BID) | ORAL | Status: DC
  Administered 2020-09-20 – 2020-09-23 (×6): 237 mL via ORAL

## 2020-09-19 MED ORDER — ENOXAPARIN SODIUM 30 MG/0.3ML IJ SOSY
30.0000 mg | PREFILLED_SYRINGE | INTRAMUSCULAR | Status: DC
  Administered 2020-09-19: 21:00:00 30 mg via SUBCUTANEOUS
  Filled 2020-09-19: qty 0.3

## 2020-09-19 MED ORDER — BACITRACIN ZINC 500 UNIT/GM EX OINT
TOPICAL_OINTMENT | Freq: Two times a day (BID) | CUTANEOUS | Status: DC
  Filled 2020-09-19: qty 0.9

## 2020-09-19 MED ORDER — ADULT MULTIVITAMIN W/MINERALS CH
1.0000 | ORAL_TABLET | Freq: Every day | ORAL | Status: DC
  Administered 2020-09-19 – 2020-09-23 (×5): 1 via ORAL
  Filled 2020-09-19 (×5): qty 1

## 2020-09-19 NOTE — Consult Note (Signed)
[place] Nurse wound follow up Wound type: neoplastic  This Probation officer was contacted today by Plastic Surgery PA-C with request to change current order to bacitracin ointment twice daily to the lesion to facilitate healing.  I have changed the order to reflect those directions.  [place] nursing team will not follow, but will remain available to this patient, the nursing and medical teams.  Please re-consult if needed. Thanks, Maudie Flakes, MSN, RN, [place], Arther Abbott  Pager# [phone]

## 2020-09-19 NOTE — Progress Notes (Signed)
PHARMACIST - PHYSICIAN COMMUNICATION  CONCERNING:  Enoxaparin (Lovenox) for DVT Prophylaxis    RECOMMENDATION: Patient was prescribed enoxaprin '40mg'$  q24 hours for VTE prophylaxis.   Filed Weights   09/18/20 0909 09/19/20 0433  Weight: 50 kg (110 lb 3.7 oz) 48.1 kg (106 lb 0.7 oz)    Body mass index is 17.65 kg/m.  Estimated Creatinine Clearance: 29.2 mL/min (A) (by C-G formula based on SCr of 1.11 mg/dL (H)).   Patient is candidate for enoxaparin '30mg'$  every 24 hours based on CrCl <72m/min or Weight <45kg  DESCRIPTION: [facility]  Patient is now receiving enoxaparin 30 mg every 24 hours    SSherilyn Banker PharmD Clinical Pharmacist  09/19/2020 12:48 PM

## 2020-09-19 NOTE — NC FL2 (Signed)
[place] LEVEL OF CARE SCREENING TOOL     IDENTIFICATION  Patient Name: Tina Ingram Birthdate: 30-Dec-1934 Sex: female Admission Date (Current Location): 09/18/2020  [facility] and [place] Number:  Engineering geologist and Address:  [facility], [address] [place], [place] [address]      Provider Number: [id number]  Attending Physician Name and Address:  Wyvonnia Dusky, MD  Relative Name and Phone Number:  Jateria Francesco (grandson) [phone]    Current Level of Care: Hospital Recommended Level of Care: [place] Prior Approval Number:    Date Approved/Denied:   PASRR Number: [id number]  Discharge Plan: SNF    Current Diagnoses: Patient Active Problem List   Diagnosis Date Noted   UTI (urinary tract infection) 09/18/2020   Fall at home, initial encounter 09/18/2020   Generalized weakness 09/18/2020   GERD without esophagitis 09/18/2020   Skin cancer 09/10/2020   Symptomatic anemia    Iron deficiency anemia due to chronic blood loss    Hypokalemia    AKI (acute kidney injury) ([place])    Essential hypertension    Tobacco dependence    GI bleed 07/05/2020    Orientation RESPIRATION BLADDER Height & Weight     Self, Time, Situation, Place  Normal External catheter Weight: 48.1 kg Height:  '5\' 5"'$  (165.1 cm)  BEHAVIORAL SYMPTOMS/MOOD NEUROLOGICAL BOWEL NUTRITION STATUS      Continent Diet (see discharge summary)  AMBULATORY STATUS COMMUNICATION OF NEEDS Skin   Extensive Assist Verbally Surgical wounds (surgical incision head- no dressings)                       Personal Care Assistance Level of Assistance  Bathing, Dressing, Feeding Bathing Assistance: Limited assistance Feeding assistance: Limited assistance Dressing Assistance: Limited assistance     Functional Limitations Info  Sight, Hearing, Speech Sight Info: Adequate Hearing Info: Adequate Speech Info: Adequate    SPECIAL CARE  FACTORS FREQUENCY  PT (By licensed PT), OT (By licensed OT)     PT Frequency: 5 times per week OT Frequency: 5 times per week            Contractures Contractures Info: Not present    Additional Factors Info  Code Status, Allergies Code Status Info: Full Allergies Info: NKA           Current Medications (09/19/2020):  This is the current hospital active medication list Current Facility-Administered Medications  Medication Dose Route Frequency Provider Last Rate Last Admin   0.9 % NaCl with KCl 40 mEq / L  infusion   Intravenous Continuous [place], Einar Pheasant, MD   Stopped at 09/18/20 1958   acetaminophen (TYLENOL) tablet 650 mg  650 mg Oral Q6H PRN Howington, Einar Pheasant, MD       Or   acetaminophen (TYLENOL) suppository 650 mg  650 mg Rectal Q6H PRN How[place], Einar Pheasant, MD       amLODipine (NORVASC) tablet 2.5 mg  2.5 mg Oral Daily Howington, Einar Pheasant, MD   2.5 mg at 09/19/20 1011   buPROPion (WELLBUTRIN XL) 24 hr tablet 300 mg  300 mg Oral QHS Howington, Einar Pheasant, MD   300 mg at 09/18/20 2118   cefTRIAXone (ROCEPHIN) 1 g in sodium chloride 0.9 % 100 mL IVPB  1 g Intravenous Q24H How[place], Einar Pheasant, MD   Stopped at 09/18/20 1445   cholecalciferol (VITAMIN D3) tablet 2,000 Units  2,000 Units Oral Daily Howington, Einar Pheasant,  MD   2,000 Units at 09/19/20 1011   enoxaparin (LOVENOX) injection 30 mg  30 mg Subcutaneous Q24H Rauer, Samantha O, RPH       guaiFENesin (ROBITUSSIN) 100 MG/5ML solution 100 mg  5 mL Oral Q4H PRN [place], Einar Pheasant, MD       loratadine (CLARITIN) tablet 10 mg  10 mg Oral Daily Howington, Einar Pheasant, MD   10 mg at 09/19/20 1011   melatonin tablet 2.5 mg  2.5 mg Oral QHS Howington, Einar Pheasant, MD   2.5 mg at 09/18/20 2117   menthol-cetylpyridinium (CEPACOL) lozenge 3 mg  1 lozenge Oral PRN [place], Einar Pheasant, MD       ondansetron [facility]) tablet 4 mg  4 mg Oral Q6H PRN [place], Einar Pheasant, MD       Or   ondansetron [facility]) injection 4 mg  4 mg Intravenous Q6H PRN Howington,  Einar Pheasant, MD       pantoprazole (PROTONIX) EC tablet 40 mg  40 mg Oral Daily Howington, Einar Pheasant, MD   40 mg at 09/19/20 1011   traZODone (DESYREL) tablet 50 mg  50 mg Oral QHS Howington, Einar Pheasant, MD   50 mg at 09/18/20 2117     Discharge Medications: Please see discharge summary for a list of discharge medications.  Relevant Imaging Results:  Relevant Lab Results:   Additional Information SS# [id number]  Shelbie Hutching, RN

## 2020-09-19 NOTE — Progress Notes (Signed)
PT Cancellation Note  Patient Details Name: Tina Ingram MRN: [id number] DOB: 1934-10-22   Cancelled Treatment:    Reason Eval/Treat Not Completed: Medical issues which prohibited therapy. Order received. Upon chart review, last recorded glucose level is 61. Will check labs later this date and perform PT evaluation when appropriate.     Patrina Levering PT, DPT 09/19/20 8:52 AM [phone]

## 2020-09-19 NOTE — TOC Initial Note (Signed)
Transition of Care [facility] - Initial/Assessment Note    Patient Details  Name: Tina Ingram MRN: [id number] Date of Birth: 10-Apr-1934  [facility] CM/SW Contact:    Shelbie Hutching, RN Phone Number: 09/19/2020, 2:06 PM  Clinical Narrative:                 Patient admitted to the hospital for UTI after falling at home.  RNCM met with patient at the bedside.  Patient is from home where she has [facility] aide services M-F from 10 am to 2 pm.  Patient has also had home health services [facility].   PT is currently recommending SNF and patient agrees.  Patient has been to [facility] in the past and was really happy with them.  RNCM will start bed search/ SNF workup.    Expected Discharge Plan: Skilled Nursing Facility Barriers to Discharge: Continued Medical Work up   Patient Goals and CMS Choice Patient states their goals for this hospitalization and ongoing recovery are:: Patient agrees to go to SNF for short term rehab CMS Medicare.gov Compare Post Acute Care list provided to:: Patient Choice offered to / list presented to : Patient  Expected Discharge Plan and Services Expected Discharge Plan: [place]   Discharge Planning Services: CM Consult Post Acute Care Choice: [place] Living arrangements for the past 2 months: Single Family Home                 DME Arranged: N/A DME Agency: NA       HH Arranged: NA          Prior Living Arrangements/Services Living arrangements for the past 2 months: Single Family Home Lives with:: Self Patient language and need for interpreter reviewed:: Yes Do you feel safe going back to the place where you live?: Yes      Need for Family Participation in Patient Care: Yes (Comment) Care giver support system in place?: Yes (comment) (grandsons and sisters) Current home services: Biomedical scientist, DME (rollator and cane) Criminal Activity/Legal Involvement Pertinent to Current  Situation/Hospitalization: No - Comment as needed  Activities of Daily Living Home Assistive Devices/Equipment: Environmental consultant (specify type), Cane (specify quad or straight) ADL Screening (condition at time of admission) Patient's cognitive ability adequate to safely complete daily activities?: No Is the patient deaf or have difficulty hearing?: Yes Does the patient have difficulty seeing, even when wearing glasses/contacts?: No Does the patient have difficulty concentrating, remembering, or making decisions?: Yes Patient able to express need for assistance with ADLs?: Yes Does the patient have difficulty dressing or bathing?: Yes Independently performs ADLs?: No Communication: Independent Dressing (OT): Independent Grooming: Needs assistance Is this a change from baseline?: Pre-admission baseline Feeding: Needs assistance Is this a change from baseline?: Pre-admission baseline Toileting: Needs assistance Is this a change from baseline?: Pre-admission baseline In/Out Bed: Independent Walks in Home: Independent Does the patient have difficulty walking or climbing stairs?: Yes Weakness of Legs: Both Weakness of Arms/Hands: None  Permission Sought/Granted Permission sought to share information with : Case Manager, Family Supports, Chartered certified accountant granted to share information with : Yes, Verbal Permission Granted  Share Information with NAME: Truly Stankiewicz  Permission granted to share info w AGENCY: SNF's in [place] granted to share info w Relationship: grandson     Emotional Assessment Appearance:: Appears stated age Attitude/Demeanor/Rapport: Engaged Affect (typically observed): Accepting Orientation: : Oriented to Self, Oriented to Place, Oriented to  Time,  Oriented to Situation Alcohol / Substance Use: Not Applicable Psych Involvement: No (comment)  Admission diagnosis:  Dehydration [E86.0] UTI (urinary tract infection)  [N39.0] Weakness [R53.1] Contusion of left hip, initial encounter [S70.02XA] Minor head injury, initial encounter [S09.90XA] Fall, initial encounter [W19.XXXA] Patient Active Problem List   Diagnosis Date Noted   UTI (urinary tract infection) 09/18/2020   Fall at home, initial encounter 09/18/2020   Generalized weakness 09/18/2020   GERD without esophagitis 09/18/2020   Skin cancer 09/10/2020   Symptomatic anemia    Iron deficiency anemia due to chronic blood loss    Hypokalemia    AKI (acute kidney injury) ([place])    Essential hypertension    Tobacco dependence    GI bleed 07/05/2020   PCP:  Derinda Late, MD Pharmacy:   [facility], [place] [address] Phone: [phone] Fax: [phone]  [id number] [place], [address] [place] [address] Phone: [phone] Fax: [phone]     Social Determinants of Health (SDOH) Interventions    Readmission Risk Interventions No flowsheet data found.

## 2020-09-19 NOTE — Evaluation (Signed)
Physical Therapy Evaluation Patient Details Name: Tina Ingram MRN: [id number] DOB: 1934-07-08 Today's Date: 09/19/2020   History of Present Illness  86 y.o. female with medical history significant of scalp cancer who presents via EMS status post fall.  Patient complains of left hip pain and headache. No acute fracture on pelvis CT. Pt found to have UTI and hypokalemia.  Clinical Impression  Pt received sleeping, easily aroused and eager to ambulate. Pt experienced pain during bed mobility, STS and ambulation in lumbar/posterior pelvis region. Pt was unable to describe specific location. She performed STS x2, toileting and ambulation with CGA and RW. She demo difficulty navigating RW around obstacles and problem-solving when wheel of RW got caught on objects due to wide turns. Cognition impaired as pt was confused which door was to the bathroom, which door was to the hallway and pt asked "where is the chair?" when she was sitting in it. She demo decreased strength, stability, functional endurance and safety awareness using RW. Will assess with Rollator at future treatment session. Would benefit from skilled PT to address above deficits and promote optimal return to PLOF.     Follow Up Recommendations Home health PT;Supervision for mobility/OOB;Supervision - Intermittent    Equipment Recommendations  None recommended by PT    Recommendations for Other Services       Precautions / Restrictions Precautions Precautions: Fall Restrictions Weight Bearing Restrictions: No      Mobility  Bed Mobility Overal bed mobility: Needs Assistance Bed Mobility: Supine to Sit     Supine to sit: Min guard;HOB elevated     General bed mobility comments: CGA for safety due to minimal posterior trunk lean during transfer. Pain in lumbar/hips during sup>sit.    Transfers Overall transfer level: Needs assistance Equipment used: Rolling walker (2 wheeled) Transfers: Sit to/from Stand Sit to Stand:  Mod assist         General transfer comment: MOD A to lift from EOB and toilet during STS. PT also assist to control descent to toilet seat. RW and/or grap bar used.  Ambulation/Gait Ambulation/Gait assistance: Min guard Gait Distance (Feet): 200 Feet Assistive device: Rolling walker (2 wheeled) Gait Pattern/deviations: Step-through pattern;Trunk flexed;Narrow base of support;Decreased stride length Gait velocity: decreased   General Gait Details: decreased stride length, shuffle-like appearance with impaired foot clearance bilaterally. Max VC on safety with RW and to remain within. PT assisted multiple occasions on managing RW and navigating around obstacles.  Stairs            Wheelchair Mobility    Modified Rankin (Stroke Patients Only)       Balance Overall balance assessment: Needs assistance Sitting-balance support: Single extremity supported;Feet supported Sitting balance-Leahy Scale: Fair Sitting balance - Comments: While sitting EOB, posterior LOB during LE MMT Postural control: Posterior lean Standing balance support: Bilateral upper extremity supported;During functional activity Standing balance-Leahy Scale: Poor Standing balance comment: Requires CGA using RW during ambulation                             Pertinent Vitals/Pain Pain Assessment: No/denies pain (at rest; pain in lumbar/pelvic region with mobility (pt unable to describe location))    Home Living Family/patient expects to be discharged to:: Private residence Living Arrangements: Alone Available Help at Discharge: Family;Neighbor;Available PRN/intermittently (sisters live nearby) Type of Home: House Home Access: Stairs to enter Entrance Stairs-Rails: None Entrance Stairs-Number of Steps: 1 Home Layout: One level Home Equipment: Clinical cytogeneticist -  2 wheels;Cane - single point;Grab bars - toilet;Grab bars - tub/shower;Hand held shower head;Walker - 4 wheels Additional Comments:  Pt states she has a "helper" come to the house daily on weekdays from 10-2 to assist with cleaning/laundry/housekeeping.    Prior Function Level of Independence: Needs assistance   Gait / Transfers Assistance Needed: Uses Rollator/SPC for functional mobility of household distances  ADL's / Homemaking Assistance Needed: Independent with ADLs. Sisters assist with IADLs        Hand Dominance   Dominant Hand: Right    Extremity/Trunk Assessment   Upper Extremity Assessment Upper Extremity Assessment: Generalized weakness    Lower Extremity Assessment Lower Extremity Assessment: Generalized weakness (4/5 throughout)       Communication   Communication: No difficulties  Cognition Arousal/Alertness: Awake/alert Behavior During Therapy: WFL for tasks assessed/performed Overall Cognitive Status: No family/caregiver present to determine baseline cognitive functioning                                 General Comments: A&Ox3. Pleasant and agreeable throughout. Impaired short term memory.      General Comments      Exercises Other Exercises Other Exercises: Toileting and hand hygiene performed at beginning of session. Education on falls prevention, proper use of AD, prevention of hospital-acquired weakness.   Assessment/Plan    PT Assessment Patient needs continued PT services  PT Problem List Decreased strength;Decreased activity tolerance;Decreased safety awareness;Decreased balance;Decreased mobility;Pain       PT Treatment Interventions DME instruction;Balance training;Gait training;Neuromuscular re-education;Functional mobility training;Therapeutic activities;Therapeutic exercise;Patient/family education    PT Goals (Current goals can be found in the Care Plan section)  Acute Rehab PT Goals Patient Stated Goal: to not fall again PT Goal Formulation: With patient Time For Goal Achievement: 10/03/20 Potential to Achieve Goals: Fair    Frequency Min  2X/week   Barriers to discharge        Co-evaluation               AM-PAC PT "6 Clicks" Mobility  Outcome Measure Help needed turning from your back to your side while in a flat bed without using bedrails?: A Little Help needed moving from lying on your back to sitting on the side of a flat bed without using bedrails?: A Little Help needed moving to and from a bed to a chair (including a wheelchair)?: A Little Help needed standing up from a chair using your arms (e.g., wheelchair or bedside chair)?: A Little Help needed to walk in hospital room?: A Little Help needed climbing 3-5 steps with a railing? : A Lot 6 Click Score: 17    End of Session Equipment Utilized During Treatment: Gait belt Activity Tolerance: Patient tolerated treatment well;Patient limited by fatigue Patient left: in chair;with call bell/phone within reach;with chair alarm set Nurse Communication: Mobility status PT Visit Diagnosis: Unsteadiness on feet (R26.81);History of falling (Z91.81);Muscle weakness (generalized) (M62.81);Difficulty in walking, not elsewhere classified (R26.2)    Time: [id number] PT Time Calculation (min) (ACUTE ONLY): 33 min   Charges:   PT Evaluation $PT Eval Moderate Complexity: 1 Mod PT Treatments $Therapeutic Activity: 23-37 mins        Patrina Levering PT, DPT 09/19/20 4:53 PM [id number]   Ramonita Lab 09/19/2020, 4:46 PM

## 2020-09-19 NOTE — Evaluation (Signed)
Occupational Therapy Evaluation Patient Details Name: Tina Ingram MRN: [id number] DOB: 05-12-1934 Today's Date: 09/19/2020    History of Present Illness 86 y.o. female with medical history significant of scalp cancer who presents via EMS status post fall.  Patient complains of left hip pain and headache. No acute fracture on pelvis CT. Pt found to have UTI and hypokalemia.   Clinical Impression   Pt seen for OT evaluation this AM following breakfast. Upon arrival to room, pt awake and supine in bed. Pt A&Ox3, reporting no pain, and agreeable to OT eval/tx. Pt reports that prior to admission, pt was MOD-I with RW for functional mobility of household distances and ADLs, and was living alone in a 1-level home. Pt endorses hx of multiple falls and fear of falling. Pt currently requires MIN A for bed mobility, MIN A for seated LB dressing, SUPERVISION/SET-UP for seated UB ADLs, and MOD A for stand pivot transfer with RW due to current functional impairments (See OT Problem List below). Pt would benefit from additional skilled OT services to maximize return to PLOF and minimize risk of future falls, injury, caregiver burden, and readmission. Upon discharge, recommend SNF.        Follow Up Recommendations  SNF    Equipment Recommendations  Other (comment) (defer to next venue of care)       Precautions / Restrictions Precautions Precautions: Fall Restrictions Weight Bearing Restrictions: No      Mobility Bed Mobility Overal bed mobility: Needs Assistance Bed Mobility: Supine to Sit     Supine to sit: Min assist     General bed mobility comments: MIN A for trunk support with HOB flat and with use of bedrails    Transfers Overall transfer level: Needs assistance   Transfers: Sit to/from Stand;Stand Pivot Transfers Sit to Stand: Mod assist Stand pivot transfers: Mod assist       General transfer comment: Requires verbal cues for sequencing stand pivot transfer bed>chair with  RW    Balance Overall balance assessment: Needs assistance Sitting-balance support: Single extremity supported;Feet supported Sitting balance-Leahy Scale: Fair Sitting balance - Comments: While sitting EOB, posterior lean observed however pt able to correct following verbal cues   Standing balance support: Bilateral upper extremity supported;During functional activity Standing balance-Leahy Scale: Poor Standing balance comment: Requires MOD A for dynamic standing balance with UE support from RW                           ADL either performed or assessed with clinical judgement   ADL Overall ADL's : Needs assistance/impaired                                       General ADL Comments: SUPERVISION/SET-UP for seated UB ADLs, MIN A for seated LB dressing, and MOD A for functional mobility with RW      Pertinent Vitals/Pain Pain Assessment: No/denies pain        Extremity/Trunk Assessment Upper Extremity Assessment Upper Extremity Assessment: Generalized weakness   Lower Extremity Assessment Lower Extremity Assessment: Generalized weakness       Communication Communication Communication: No difficulties   Cognition Arousal/Alertness: Awake/alert Behavior During Therapy: WFL for tasks assessed/performed Overall Cognitive Status: No family/caregiver present to determine baseline cognitive functioning  General Comments: A&Ox3. Pleasant and agreeable throughout. Decreased short term memory              Home Living Family/patient expects to be discharged to:: Private residence Living Arrangements: Alone Available Help at Discharge: Family;Neighbor;Available PRN/intermittently (sisters live nearby) Type of Home: House Home Access: Stairs to enter [employer] of Steps: 1   Home Layout: One level     Bathroom Shower/Tub: Tub/shower unit         Home Equipment: Clinical cytogeneticist - 2  wheels;Cane - single point;Grab bars - toilet;Grab bars - tub/shower;Hand held shower head          Prior Functioning/Environment Level of Independence: Needs assistance  Gait / Transfers Assistance Needed: Uses RW/SPC for functional mobility of household distances ADL's / Homemaking Assistance Needed: Independent with ADLs. Sisters assist with IADLs            OT Problem List: Decreased strength;Decreased activity tolerance;Impaired balance (sitting and/or standing);Decreased cognition      OT Treatment/Interventions: Self-care/ADL training;Therapeutic exercise;Energy conservation;DME and/or AE instruction;Therapeutic activities;Patient/family education;Balance training    OT Goals(Current goals can be found in the care plan section) Acute Rehab OT Goals Patient Stated Goal: to not fall again OT Goal Formulation: With patient Time For Goal Achievement: 10/03/20 ADL Goals Pt Will Perform Lower Body Dressing: with min assist;sit to/from stand Pt Will Transfer to Toilet: with min guard assist;stand pivot transfer;bedside commode Pt Will Perform Toileting - Clothing Manipulation and hygiene: with min assist;sit to/from stand  OT Frequency: Min 1X/week    AM-PAC OT "6 Clicks" Daily Activity     Outcome Measure Help from another person eating meals?: None Help from another person taking care of personal grooming?: A Little Help from another person toileting, which includes using toliet, bedpan, or urinal?: A Lot Help from another person bathing (including washing, rinsing, drying)?: A Lot Help from another person to put on and taking off regular upper body clothing?: A Little Help from another person to put on and taking off regular lower body clothing?: A Little 6 Click Score: 17   End of Session Equipment Utilized During Treatment: Gait belt;Rolling walker Nurse Communication: Mobility status  Activity Tolerance: Patient tolerated treatment well Patient left: in chair;with  call bell/phone within reach;with chair alarm set  OT Visit Diagnosis: Unsteadiness on feet (R26.81);History of falling (Z91.81)                Time: [id number] OT Time Calculation (min): 29 min Charges:  OT General Charges $OT Visit: 1 Visit OT Evaluation $OT Eval Moderate Complexity: 1 Mod OT Treatments $Self Care/Home Management : 8-22 mins  Fredirick Maudlin, OTR/L [place]

## 2020-09-19 NOTE — Progress Notes (Signed)
PROGRESS NOTE    Tina Ingram  [id number] DOB: 06/30/1934 DOA: 09/18/2020 PCP: Derinda Late, MD  Assessment & Plan:   Principal Problem:   UTI (urinary tract infection) Active Problems:   Hypokalemia   Essential hypertension   Skin cancer   Fall at home, initial encounter   Generalized weakness   GERD without esophagitis  UTI: urine cx is pending. Continue on IV rocephin    Multiple falls: likely secondary to deconditioning & UTI. PT/OT consulted   Hypokalemia: WNL today    Leukocytosis: likely secondary to above infection    HTN: continue on home dose of amlodipine. Continue to hold HCTZ   GERD: continue on PPI    Skin lesion/cancer: scab in place on scalp. Wound care consult  Thrombocytosis: etiology unclear. Will continue to monitor   DVT prophylaxis: lovenox Code Status: partial  Family Communication:  Disposition Plan: likely d/c to SNF  Level of care: Med-Surg  Status is: Inpatient  Remains inpatient appropriate because:Unsafe d/c plan, IV treatments appropriate due to intensity of illness or inability to take PO, and Inpatient level of care appropriate due to severity of illness  Dispo: The patient is from: Home              Anticipated d/c is to: SNF              Patient currently is not medically stable to d/c.   Difficult to place patient : unclear      Consultants:    Procedures:   Antimicrobials: rocephin   Subjective: Pt c/o fatigue   Objective: Vitals:   09/18/20 1735 09/18/20 2329 09/19/20 0433 09/19/20 0437  BP: (!) 168/78 (!) 150/67  134/68  Pulse: 98 (!) 102  87  Resp: '15 19  18  '$ Temp:  99.1 F (37.3 C)  98.7 F (37.1 C)  TempSrc:      SpO2: 96% 94%  94%  Weight:   48.1 kg   Height:        Intake/Output Summary (Last 24 hours) at 09/19/2020 0802 Last data filed at 09/19/2020 0406 Gross per 24 hour  Intake 1021.77 ml  Output --  Net 1021.77 ml   Filed Weights   09/18/20 0909 09/19/20 0433  Weight: 50 kg  48.1 kg    Examination:  General exam: Appears calm and comfortable  Respiratory system: Clear to auscultation. Respiratory effort normal. Cardiovascular system: S1 & S2 +. No  rubs, gallops or clicks. Gastrointestinal system: Abdomen is nondistended, soft and nontender.  Normal bowel sounds heard. Psychiatry: Judgement and insight appear normal. Flat mood and affect    Data Reviewed: I have personally reviewed following labs and imaging studies  CBC: Recent Labs  Lab 09/18/20 1040 09/19/20 0414  WBC 16.4* 13.7*  NEUTROABS 14.5*  --   HGB 13.6 11.6*  HCT 43.9 36.2  MCV 75.2* 74.2*  PLT 759* [id number]*   Basic Metabolic Panel: Recent Labs  Lab 09/18/20 1040 09/19/20 0414  NA 136 139  K 3.2* 4.0  CL 98 107  CO2 28 26  GLUCOSE 93 61*  BUN 13 11  CREATININE 1.13* 1.11*  CALCIUM 9.3 8.7*   GFR: Estimated Creatinine Clearance: 29.2 mL/min (A) (by C-G formula based on SCr of 1.11 mg/dL (H)). Liver Function Tests: Recent Labs  Lab 09/18/20 1040 09/19/20 0414  AST 21 15  ALT 10 8  ALKPHOS 81 60  BILITOT 1.4* 1.2  PROT 6.6 5.6*  ALBUMIN 3.7 3.0*   No results  for input(s): LIPASE, AMYLASE in the last 168 hours. No results for input(s): AMMONIA in the last 168 hours. Coagulation Profile: No results for input(s): INR, PROTIME in the last 168 hours. Cardiac Enzymes: No results for input(s): CKTOTAL, CKMB, CKMBINDEX, TROPONINI in the last 168 hours. BNP (last 3 results) No results for input(s): PROBNP in the last 8760 hours. HbA1C: No results for input(s): HGBA1C in the last 72 hours. CBG: No results for input(s): GLUCAP in the last 168 hours. Lipid Profile: No results for input(s): CHOL, HDL, LDLCALC, TRIG, CHOLHDL, LDLDIRECT in the last 72 hours. Thyroid Function Tests: No results for input(s): TSH, T4TOTAL, FREET4, T3FREE, THYROIDAB in the last 72 hours. Anemia Panel: No results for input(s): VITAMINB12, FOLATE, FERRITIN, TIBC, IRON, RETICCTPCT in the last 72  hours. Sepsis Labs: Recent Labs  Lab 09/18/20 1040  LATICACIDVEN 1.5    Recent Results (from the past 240 hour(s))  SARS Coronavirus 2 by RT PCR (hospital order, performed in [facility] hospital lab) Nasopharyngeal Nasopharyngeal Swab     Status: None   Collection Time: 09/10/20  8:31 AM   Specimen: Nasopharyngeal Swab  Result Value Ref Range Status   SARS Coronavirus 2 NEGATIVE NEGATIVE Final    Comment: (NOTE) SARS-CoV-2 target nucleic acids are NOT DETECTED.  The SARS-CoV-2 RNA is generally detectable in upper and lower respiratory specimens during the acute phase of infection. The lowest concentration of SARS-CoV-2 viral copies this assay can detect is 250 copies / mL. A negative result does not preclude SARS-CoV-2 infection and should not be used as the sole basis for treatment or other patient management decisions.  A negative result may occur with improper specimen collection / handling, submission of specimen other than nasopharyngeal swab, presence of viral mutation(s) within the areas targeted by this assay, and inadequate number of viral copies (<250 copies / mL). A negative result must be combined with clinical observations, patient history, and epidemiological information.  Fact Sheet for Patients:   StrictlyIdeas.no  Fact Sheet for Healthcare Providers: BankingDealers.co.za  This test is not yet approved or  cleared by the [place] FDA and has been authorized for detection and/or diagnosis of SARS-CoV-2 by FDA under an Emergency Use Authorization (EUA).  This EUA will remain in effect (meaning this test can be used) for the duration of the COVID-19 declaration under Section 564(b)(1) of the Act, 21 U.S.C. section 360bbb-3(b)(1), unless the authorization is terminated or revoked sooner.  Performed at [facility], [place] [address], [place], [place] [address]   Resp Panel by RT-PCR (Flu A&B, Covid)  Nasopharyngeal Swab     Status: None   Collection Time: 09/18/20  2:17 PM   Specimen: Nasopharyngeal Swab; Nasopharyngeal(NP) swabs in vial transport medium  Result Value Ref Range Status   SARS Coronavirus 2 by RT PCR NEGATIVE NEGATIVE Final    Comment: (NOTE) SARS-CoV-2 target nucleic acids are NOT DETECTED.  The SARS-CoV-2 RNA is generally detectable in upper respiratory specimens during the acute phase of infection. The lowest concentration of SARS-CoV-2 viral copies this assay can detect is 138 copies/mL. A negative result does not preclude SARS-Cov-2 infection and should not be used as the sole basis for treatment or other patient management decisions. A negative result may occur with  improper specimen collection/handling, submission of specimen other than nasopharyngeal swab, presence of viral mutation(s) within the areas targeted by this assay, and inadequate number of viral copies(<138 copies/mL). A negative result must be combined with clinical observations, patient history, and  epidemiological information. The expected result is Negative.  Fact Sheet for Patients:  EntrepreneurPulse.com.au  Fact Sheet for Healthcare Providers:  IncredibleEmployment.be  This test is no t yet approved or cleared by the [place] FDA and  has been authorized for detection and/or diagnosis of SARS-CoV-2 by FDA under an Emergency Use Authorization (EUA). This EUA will remain  in effect (meaning this test can be used) for the duration of the COVID-19 declaration under Section 564(b)(1) of the Act, 21 U.S.C.section 360bbb-3(b)(1), unless the authorization is terminated  or revoked sooner.       Influenza A by PCR NEGATIVE NEGATIVE Final   Influenza B by PCR NEGATIVE NEGATIVE Final    Comment: (NOTE) The Xpert Xpress SARS-CoV-2/FLU/RSV plus assay is intended as an aid in the diagnosis of influenza from Nasopharyngeal swab specimens and should not be  used as a sole basis for treatment. Nasal washings and aspirates are unacceptable for Xpert Xpress SARS-CoV-2/FLU/RSV testing.  Fact Sheet for Patients: EntrepreneurPulse.com.au  Fact Sheet for Healthcare Providers: IncredibleEmployment.be  This test is not yet approved or cleared by the [place] FDA and has been authorized for detection and/or diagnosis of SARS-CoV-2 by FDA under an Emergency Use Authorization (EUA). This EUA will remain in effect (meaning this test can be used) for the duration of the COVID-19 declaration under Section 564(b)(1) of the Act, 21 U.S.C. section 360bbb-3(b)(1), unless the authorization is terminated or revoked.  Performed at [facility], [address] [place], [place] [address]          Radiology Studies: CT HEAD WO CONTRAST (5MM)  Result Date: 09/18/2020 CLINICAL DATA:  CT Head 01/15/2019. EXAM: CT HEAD WITHOUT CONTRAST TECHNIQUE: Contiguous axial images were obtained from the base of the skull through the vertex without intravenous contrast. COMPARISON:  None. FINDINGS: Brain: New large area of hypoattenuation and loss of gray-white differentiation in the posterior right frontal and parietal lobes, compatible with interval infarct. There is evidence of volume loss with mild ex vacuo ventricular dilation of the right posterolateral ventricle and temporal horn. No evidence of acute hemorrhage. No sizable extra-axial fluid collection. Postoperative changes of prior right frontal craniotomy. No hydrocephalus, midline shift, or mass lesion. Vascular: No hyperdense vessel identified. Calcific intracranial atherosclerosis. Skull: Postoperative changes of prior right frontal craniotomy. Sinuses/Orbits: Probable retention cysts in the right frontal sinus and left sphenoid sinus. Otherwise, sinuses are largely clear. No acute orbital findings. Other: Moderate right mastoid effusion. IMPRESSION: 1. Findings  compatible with interval large right frontoparietal infarct, most likely chronic or subacute given evidence of volume loss. If there is concern for recent infarct, recommend MRI. 2. Otherwise, no evidence of acute intracranial abnormality. Electronically Signed   By: Margaretha Sheffield M.D.   On: 09/18/2020 10:15   CT Cervical Spine Wo Contrast  Result Date: 09/18/2020 CLINICAL DATA:  Neck trauma (Age >= 65y) EXAM: CT CERVICAL SPINE WITHOUT CONTRAST TECHNIQUE: Multidetector CT imaging of the cervical spine was performed without intravenous contrast. Multiplanar CT image reconstructions were also generated. COMPARISON:  None.  CT cervical spine 12/30/2018. FINDINGS: Alignment: Similar alignment. Similar straightening of the normal cervical lordosis and mild anterolisthesis of C4 on C5. Skull base and vertebrae: No evidence of acute fracture. Vertebral body heights are similar to the prior. Soft tissues and spinal canal: No prevertebral fluid or swelling. No visible canal hematoma. Disc levels: Similar multilevel degenerative disc disease, greatest at C5-C6 and C6-C7 where there is disc height loss and endplate spurring. Upper chest: Visualized lung  apices are clear. Other: Calcific atherosclerosis of the carotids. IMPRESSION: 1. No evidence of acute fracture or traumatic malalignment. 2. Similar multilevel degenerative disc disease. Electronically Signed   By: Margaretha Sheffield M.D.   On: 09/18/2020 10:20   CT LUMBAR SPINE WO CONTRAST  Result Date: 09/18/2020 CLINICAL DATA:  Pelvic trauma EXAM: CT LUMBAR SPINE WITHOUT CONTRAST TECHNIQUE: Multidetector CT imaging of the lumbar spine was performed without intravenous contrast administration. Multiplanar CT image reconstructions were also generated. COMPARISON:  Lumbar radiographs 12/30/2018. FINDINGS: Segmentation: 5 non rib-bearing lumbar vertebral bodies. Alignment: S shaped lumbar curvature (Dextrocurvature of the upper lumbar spine with levocurvature of the  lower lumbar spine). No substantial sagittal subluxation. Vertebrae: No evidence of acute fracture. Chronic angulated fracture of the S3 vertebral body, partially imaged. Paraspinal and other soft tissues: Calcific atherosclerosis of the aorta and branch vessels. Poorly evaluated hypodense right renal lesion, possibly a cyst. Disc levels: Multilevel facet arthropathy/hypertrophy, greatest in the lower lumbar spine. Posterior disc bulges at multiple levels. IMPRESSION: 1. No evidence of acute fracture or traumatic malalignment. 2. Chronic angulated fracture of the S3 vertebral body, partially imaged. 3. S-shaped lumbar curvature with multilevel facet arthropathy/hypertrophy. MRI could further evaluate the canal/foramina if clinically indicated. 4. Osteopenia. Electronically Signed   By: Margaretha Sheffield M.D.   On: 09/18/2020 10:29   CT PELVIS WO CONTRAST  Result Date: 09/18/2020 CLINICAL DATA:  Pelvic trauma, fall, left hip pain EXAM: CT PELVIS WITHOUT CONTRAST TECHNIQUE: Multidetector CT imaging of the pelvis was performed following the standard protocol without intravenous contrast. COMPARISON:  06/12/2011 FINDINGS: Urinary Tract:  No abnormality visualized. Bowel: Unremarkable visualized pelvic bowel loops. Status post appendectomy. Vascular/Lymphatic: No pathologically enlarged lymph nodes. Aortic atherosclerosis Reproductive:  No mass or other significant abnormality Other:  None. Musculoskeletal: Osteopenia. Chronic fracture deformities of the left inferior pubic ramus (series 3, image 42). Nonacute, angulated fracture of the S3 segment (series 6, image 72). IMPRESSION: 1. Osteopenia. 2. Chronic fracture deformities of the left inferior pubic ramus. 3. Chronic, angulated fracture of the S3 segment. 4. No acute displaced fracture. Aortic Atherosclerosis (ICD10-I70.0). Electronically Signed   By: Eddie Candle M.D.   On: 09/18/2020 10:12        Scheduled Meds:  amLODipine  2.5 mg Oral Daily   buPROPion   300 mg Oral QHS   cholecalciferol  2,000 Units Oral Daily   loratadine  10 mg Oral Daily   melatonin  2.5 mg Oral QHS   pantoprazole  40 mg Oral Daily   traZODone  50 mg Oral QHS   Continuous Infusions:  0.9 % NaCl with KCl 40 mEq / L Stopped (09/18/20 1958)   cefTRIAXone (ROCEPHIN)  IV Stopped (09/18/20 1445)     LOS: 1 day    Time spent: 33 mins    Wyvonnia Dusky, MD [facility] Pager [phone]  If 7PM-7AM, please contact night-coverage 09/19/2020, 8:02 AM

## 2020-09-19 NOTE — Progress Notes (Signed)
Initial Nutrition Assessment  DOCUMENTATION CODES:  Not applicable  INTERVENTION:  Liberalize diet to regular, encourage PO intake Ensure Enlive po BID, each supplement provides 350 kcal and 20 grams of protein MVI with minerals daily  NUTRITION DIAGNOSIS:  Inadequate oral intake related to poor appetite as evidenced by meal completion < 50%, per patient/family report.  GOAL:  Patient will meet greater than or equal to 90% of their needs  MONITOR:  PO intake, Supplement acceptance  REASON FOR ASSESSMENT:  Malnutrition Screening Tool    ASSESSMENT:  86 y.o. female with medical hx of HTN presented to ED from home with pain after a fall landing on her left hip. No fracture seen on imaging, but family concerned with AMS and falls recently. Found to be dehydrated  Pt resting in bedside chair at the time of assessment states that she is about to order her lunch. States that appetite has not been great this admission but that she doesn't eat much at baseline. Reports that she has been considering starting ensure to supplement intake, would like to try while admitted.  Discussed in rounds, MD states pt is stable, CM working on discharge planning, likely will be able to leave tomorrow.   Noted - BMI listed as underweight. On exam, deficits are not indicative of underweight BMI. Upon review, height is listed as 5'5 but PCP indicates ~5'1". Updated in chart, new BMI seems more appropriate based on physical exam.    Average Meal Intake: 9/6-9/7: 20% intake x 2 recorded meals  Nutritionally Relevant Medications: Scheduled Meds:  cholecalciferol  2,000 Units Oral Daily   pantoprazole  40 mg Oral Daily   PRN Meds: ondansetron  Labs Reviewed:  NUTRITION - FOCUSED PHYSICAL EXAM: Flowsheet Row Most Recent Value  Orbital Region Mild depletion  Upper Arm Region No depletion  Thoracic and Lumbar Region No depletion  Buccal Region No depletion  Temple Region No depletion  Clavicle Bone  Region Mild depletion  Clavicle and Acromion Bone Region Mild depletion  Scapular Bone Region Mild depletion  Dorsal Hand No depletion  Patellar Region Moderate depletion  Anterior Thigh Region Moderate depletion  Posterior Calf Region Moderate depletion  Edema (RD Assessment) None  Hair Reviewed  Eyes Reviewed  Mouth Reviewed  Skin Reviewed  Nails Reviewed   Diet Order:   Diet Order             Diet regular Room service appropriate? Yes; Fluid consistency: Thin  Diet effective now                   EDUCATION NEEDS:  No education needs have been identified at this time  Skin:  Skin Assessment: Reviewed RN Assessment (surgical incision to the scalp)  Last BM:  unsure  Height:  Ht Readings from Last 1 Encounters:  09/19/20 '5\' 1"'$  (1.549 m)    Weight:  Wt Readings from Last 1 Encounters:  09/19/20 48.1 kg    Ideal Body Weight:  56.8 kg  BMI:  Body mass index is 20.04 kg/m.  Estimated Nutritional Needs:  Kcal:  1400-1600 kcal/d Protein:  70-80 g/d Fluid:  1.5-1.6 L/d   Ranell Patrick, RD, LDN Clinical Dietitian Pager on [place]

## 2020-09-20 ENCOUNTER — Telehealth: Payer: Self-pay | Admitting: Plastic Surgery

## 2020-09-20 LAB — BASIC METABOLIC PANEL
Anion gap: 6 (ref 5–15)
BUN: 10 mg/dL (ref 8–23)
CO2: 26 mmol/L (ref 22–32)
Calcium: 8.9 mg/dL (ref 8.9–10.3)
Chloride: 106 mmol/L (ref 98–111)
Creatinine, Ser: 0.98 mg/dL (ref 0.44–1.00)
GFR, Estimated: 57 mL/min — ABNORMAL LOW (ref >60)
Glucose, Bld: 71 mg/dL (ref 70–99)
Potassium: 4.5 mmol/L (ref 3.5–5.1)
Sodium: 138 mmol/L (ref 135–145)

## 2020-09-20 LAB — CBC
HCT: 37.9 % (ref 36.0–46.0)
Hemoglobin: 12.1 g/dL (ref 12.0–15.0)
MCH: 24 pg — ABNORMAL LOW (ref 26.0–34.0)
MCHC: 31.9 g/dL (ref 30.0–36.0)
MCV: 75 fL — ABNORMAL LOW (ref 80.0–100.0)
Platelets: 678 10*3/uL — ABNORMAL HIGH (ref 150–400)
RBC: 5.05 MIL/uL (ref 3.87–5.11)
RDW: 20.6 % — ABNORMAL HIGH (ref 11.5–15.5)
WBC: 12.1 10*3/uL — ABNORMAL HIGH (ref 4.0–10.5)
nRBC: 0 % (ref 0.0–0.2)

## 2020-09-20 MED ORDER — ENOXAPARIN SODIUM 40 MG/0.4ML IJ SOSY
40.0000 mg | PREFILLED_SYRINGE | INTRAMUSCULAR | Status: DC
  Administered 2020-09-20: 40 mg via SUBCUTANEOUS
  Filled 2020-09-20: qty 0.4

## 2020-09-20 MED ORDER — HALOPERIDOL LACTATE 5 MG/ML IJ SOLN
5.0000 mg | Freq: Four times a day (QID) | INTRAMUSCULAR | Status: DC | PRN
  Administered 2020-09-20: 16:00:00 5 mg via INTRAMUSCULAR
  Filled 2020-09-20 (×2): qty 1

## 2020-09-20 NOTE — Progress Notes (Signed)
Physical Therapy Treatment Patient Details Name: Tina Ingram MRN: [id number] DOB: January 11, 1935 Today's Date: 09/20/2020    History of Present Illness 86 y.o. female with medical history significant of scalp cancer who presents via EMS status post fall.  Patient complains of left hip pain and headache. No acute fracture on pelvis CT. Pt found to have UTI and hypokalemia.    PT Comments    Pt received supine in bed, agreeable to therapy. Today she required MOD A for sup>sit to lift trunk and MIN A to stand from EOB. She performed 2 bouts of ambulation - 233f followed by 3052fusing RW. She continues to demo decreased safety awareness using RW with VC/TC to correct, difficulty navigating around obstacles and performing turns, difficulty remaining focused to task and confused throughout the session as she looked for her shoes and her Rollator out in the hallway. PT is changing d/c rec to SNF as family is not able to provide enough assistance for pt to return home safely. Would benefit from skilled PT to address above deficits and promote optimal return to PLOF.   Follow Up Recommendations  SNF;Supervision for mobility/OOB;Supervision - Intermittent     Equipment Recommendations  None recommended by PT    Recommendations for Other Services       Precautions / Restrictions Precautions Precautions: Fall Restrictions Weight Bearing Restrictions: No    Mobility  Bed Mobility Overal bed mobility: Needs Assistance Bed Mobility: Supine to Sit     Supine to sit: Mod assist Sit to supine: Min assist   General bed mobility comments: MOD A to lift trunk to sitting and reposition hips to sit EOB. Difficulty following VC.    Transfers Overall transfer level: Needs assistance Equipment used: Rolling walker (2 wheeled) Transfers: Sit to/from Stand Sit to Stand: Min assist         General transfer comment: Multiple STS trials with CGA, unable to fully lift hips from bed surface. MIN A  for minimal lift to achieve success.  Ambulation/Gait Ambulation/Gait assistance: Min guard Gait Distance (Feet): 300 Feet Assistive device: Rolling walker (2 wheeled) Gait Pattern/deviations: Step-through pattern;Trunk flexed;Narrow base of support;Decreased stride length Gait velocity: decreased   General Gait Details: [id number] by 300f20fing RW. Step-through with increased tep length on RLE. VC to remain within RW. Difficulty with turns and navigating around obstacles. Pt was distracted, stopped to look into multiple pt rooms and cued to continue walking. She was also confused as she was looking for her shoes out in the hallway.   Stairs             Wheelchair Mobility    Modified Rankin (Stroke Patients Only)       Balance Overall balance assessment: Needs assistance Sitting-balance support: Single extremity supported;Feet supported Sitting balance-Leahy Scale: Fair Sitting balance - Comments: Pt with L lateral lean, no LOB Postural control: Left lateral lean Standing balance support: Bilateral upper extremity supported;During functional activity Standing balance-Leahy Scale: Poor Standing balance comment: Required CGA and RW                            Cognition Arousal/Alertness: Awake/alert Behavior During Therapy: WFL for tasks assessed/performed Overall Cognitive Status: No family/caregiver present to determine baseline cognitive functioning                                 General Comments: pleasantly  confused      Exercises Other Exercises Other Exercises: Educ re: falls prevention, safe use of AD, DC recs    General Comments        Pertinent Vitals/Pain Pain Assessment: Faces Faces Pain Scale: Hurts a little bit Pain Location: neck with cervical extension Pain Intervention(s): Repositioned;Monitored during session    Home Living                      Prior Function            PT Goals (current goals  can now be found in the care plan section) Acute Rehab PT Goals Patient Stated Goal: to not fall again PT Goal Formulation: With patient Time For Goal Achievement: 10/03/20 Potential to Achieve Goals: Fair    Frequency    Min 2X/week      PT Plan      Co-evaluation              AM-PAC PT "6 Clicks" Mobility   Outcome Measure  Help needed turning from your back to your side while in a flat bed without using bedrails?: A Little Help needed moving from lying on your back to sitting on the side of a flat bed without using bedrails?: A Little Help needed moving to and from a bed to a chair (including a wheelchair)?: A Little Help needed standing up from a chair using your arms (e.g., wheelchair or bedside chair)?: A Little Help needed to walk in hospital room?: A Little Help needed climbing 3-5 steps with a railing? : A Lot 6 Click Score: 17    End of Session Equipment Utilized During Treatment: Gait belt Activity Tolerance: Patient tolerated treatment well Patient left: in chair;with call bell/phone within reach;with chair alarm set Nurse Communication: Mobility status PT Visit Diagnosis: Unsteadiness on feet (R26.81);History of falling (Z91.81);Muscle weakness (generalized) (M62.81);Difficulty in walking, not elsewhere classified (R26.2);Other abnormalities of gait and mobility (R26.89)     Time: [id number] PT Time Calculation (min) (ACUTE ONLY): 25 min  Charges:  $Therapeutic Activity: 23-37 mins                    Patrina Levering PT, DPT 09/20/20 12:48 PM [id number]    [facility] 09/20/2020, 12:41 PM

## 2020-09-20 NOTE — Progress Notes (Signed)
Occupational Therapy Treatment Patient Details Name: Tina Ingram MRN: [id number] DOB: 1934/05/08 Today's Date: 09/20/2020    History of present illness 86 y.o. female with medical history significant of scalp cancer who presents via EMS status post fall.  Patient complains of left hip pain and headache. No acute fracture on pelvis CT. Pt found to have UTI and hypokalemia.   OT comments  [name] presents today with generalized weakness, limited endurance, fair/poor balance, and pain. She reports having "a dozen or more" falls at home within the past 6 months. Pt requires Min/Mod A for bed mobility, Min Guard for sit<>stand with RW, has LOB with single UE support in standing, and is unable to self-correct left lateral lean in sitting. Discussed DC recs with pt and her sister. Therapist recommends STR, pt and sister in agreement with this rec, with pt stating, "I know I am not strong enough to be home alone right now."     Follow Up Recommendations  SNF    Equipment Recommendations       Recommendations for Other Services      Precautions / Restrictions Precautions Precautions: Fall Restrictions Weight Bearing Restrictions: No       Mobility Bed Mobility Overal bed mobility: Needs Assistance Bed Mobility: Supine to Sit;Sit to Supine     Supine to sit: Min assist Sit to supine: Min assist   General bed mobility comments: Min A for supine<>sit; Mod A for repositioning/scooting in bed    Transfers Overall transfer level: Needs assistance Equipment used: Rolling walker (2 wheeled) Transfers: Sit to/from Stand Sit to Stand: Min guard         General transfer comment: required cues for safe hand positioning on RW    Balance Overall balance assessment: Needs assistance Sitting-balance support: Single extremity supported;Feet supported Sitting balance-Leahy Scale: Fair Sitting balance - Comments: Pt with L lateral lean, of which she is unaware, unable to self-correct  after given cues to do so Postural control: Left lateral lean Standing balance support: Bilateral upper extremity supported;During functional activity;Single extremity supported Standing balance-Leahy Scale: Poor Standing balance comment: Poor balance with single UE support, Fair balance with b/l UE support                           ADL either performed or assessed with clinical judgement   ADL Overall ADL's : Needs assistance/impaired                                     Functional mobility during ADLs: Moderate assistance;Rolling walker       Vision       Perception     Praxis      Cognition Arousal/Alertness: Awake/alert Behavior During Therapy: WFL for tasks assessed/performed Overall Cognitive Status: Within Functional Limits for tasks assessed                                 General Comments: A&O x 4        Exercises Other Exercises Other Exercises: Educ re: falls prevention, safe use of AD, DC recs   Shoulder Instructions       General Comments      Pertinent Vitals/ Pain       Pain Assessment: Faces Faces Pain Scale: Hurts little more Pain Location: neck, buttocks Pain  Intervention(s): Repositioned;Monitored during session  Home Living                                          Prior Functioning/Environment              Frequency  Min 1X/week        Progress Toward Goals  OT Goals(current goals can now be found in the care plan section)  Progress towards OT goals: Progressing toward goals  Acute Rehab OT Goals Patient Stated Goal: to not fall again OT Goal Formulation: With patient Time For Goal Achievement: 10/03/20  Plan Discharge plan remains appropriate;Frequency remains appropriate    Co-evaluation                 AM-PAC OT "6 Clicks" Daily Activity     Outcome Measure   Help from another person eating meals?: None Help from another person taking care of  personal grooming?: A Little Help from another person toileting, which includes using toliet, bedpan, or urinal?: A Lot Help from another person bathing (including washing, rinsing, drying)?: A Lot Help from another person to put on and taking off regular upper body clothing?: A Little Help from another person to put on and taking off regular lower body clothing?: A Little 6 Click Score: 17    End of Session Equipment Utilized During Treatment: Rolling walker  OT Visit Diagnosis: Unsteadiness on feet (R26.81);History of falling (Z91.81)   Activity Tolerance Patient tolerated treatment well   Patient Left in bed;with bed alarm set;with call bell/phone within reach;with family/visitor present   Nurse Communication          Time: [id number] OT Time Calculation (min): 30 min  Charges: OT General Charges $OT Visit: 1 Visit OT Treatments $Self Care/Home Management : 23-37 mins  Josiah Lobo, PhD, MS, OTR/L 09/20/20, 12:11 PM

## 2020-09-20 NOTE — Progress Notes (Addendum)
PROGRESS NOTE    Tina Ingram  [id number] DOB: 02-May-1934 DOA: 09/18/2020 PCP: Derinda Late, MD  Assessment & Plan:   Principal Problem:   UTI (urinary tract infection) Active Problems:   Hypokalemia   Essential hypertension   Skin cancer   Fall at home, initial encounter   Generalized weakness   GERD without esophagitis  UTI: urine cx is pending still. Continue on IV rocephin    Multiple falls: likely secondary to deconditioning & UTI. PT/OT recs SNF   Hypokalemia: within normal limits today    Leukocytosis: trending down. Likely secondary to infection     HTN: continue on home dose of amlodipine. Continue to hold HCTZ   GERD: continue on PPI    Skin lesion/cancer: scab in place on scalp. Continue w/ wound care   Thrombocytosis: etiology unclear, will continue to monitor    DVT prophylaxis: lovenox Code Status: partial  Family Communication: discussed pt's care w/ pt's grandson, Larkin Ina and answered his questions Disposition Plan: likely d/c to SNF  Level of care: Med-Surg  Status is: Inpatient  Remains inpatient appropriate because:Unsafe d/c plan, IV treatments appropriate due to intensity of illness or inability to take PO, and Inpatient level of care appropriate due to severity of illness  Dispo: The patient is from: Home              Anticipated d/c is to: SNF              Patient currently is not medically stable to d/c.   Difficult to place patient : unclear      Consultants:    Procedures:   Antimicrobials: rocephin   Subjective: Pt c/o malaise  Objective: Vitals:   09/19/20 2329 09/20/20 0401 09/20/20 0402 09/20/20 0402  BP: (!) 158/65  (!) 174/87 (!) 174/87  Pulse: 63  90 90  Resp: '17  18 18  '$ Temp: 98.1 F (36.7 C)  97.6 F (36.4 C) 97.6 F (36.4 C)  TempSrc: Oral  Oral Oral  SpO2: 97%  97% 97%  Weight:  47.5 kg    Height:        Intake/Output Summary (Last 24 hours) at 09/20/2020 0743 Last data filed at 09/20/2020  0600 Gross per 24 hour  Intake 240 ml  Output 2100 ml  Net -1860 ml   Filed Weights   09/18/20 0909 09/19/20 0433 09/20/20 0401  Weight: 50 kg 48.1 kg 47.5 kg    Examination:  General exam: Appears uncomfortable Respiratory system: clear breath sounds b/l  Cardiovascular system: S1/S2+. No rubs or clicks Gastrointestinal system: Abd is soft, NT, ND & hypoactive bowel sounds  Psychiatry: Judgement and affect appear normal. Flat mood and affect    Data Reviewed: I have personally reviewed following labs and imaging studies  CBC: Recent Labs  Lab 09/18/20 1040 09/19/20 0414 09/20/20 0533  WBC 16.4* 13.7* 12.1*  NEUTROABS 14.5*  --   --   HGB 13.6 11.6* 12.1  HCT 43.9 36.2 37.9  MCV 75.2* 74.2* 75.0*  PLT 759* 627* 123XX123*   Basic Metabolic Panel: Recent Labs  Lab 09/18/20 1040 09/19/20 0414 09/20/20 0533  NA 136 139 138  K 3.2* 4.0 4.5  CL 98 107 106  CO2 '28 26 26  '$ GLUCOSE 93 61* 71  BUN '13 11 10  '$ CREATININE 1.13* 1.11* 0.98  CALCIUM 9.3 8.7* 8.9   GFR: Estimated Creatinine Clearance: 32.6 mL/min (by C-G formula based on SCr of 0.98 mg/dL). Liver Function Tests: Recent Labs  Lab 09/18/20 1040 09/19/20 0414  AST 21 15  ALT 10 8  ALKPHOS 81 60  BILITOT 1.4* 1.2  PROT 6.6 5.6*  ALBUMIN 3.7 3.0*   No results for input(s): LIPASE, AMYLASE in the last 168 hours. No results for input(s): AMMONIA in the last 168 hours. Coagulation Profile: No results for input(s): INR, PROTIME in the last 168 hours. Cardiac Enzymes: No results for input(s): CKTOTAL, CKMB, CKMBINDEX, TROPONINI in the last 168 hours. BNP (last 3 results) No results for input(s): PROBNP in the last 8760 hours. HbA1C: No results for input(s): HGBA1C in the last 72 hours. CBG: No results for input(s): GLUCAP in the last 168 hours. Lipid Profile: No results for input(s): CHOL, HDL, LDLCALC, TRIG, CHOLHDL, LDLDIRECT in the last 72 hours. Thyroid Function Tests: No results for input(s): TSH,  T4TOTAL, FREET4, T3FREE, THYROIDAB in the last 72 hours. Anemia Panel: No results for input(s): VITAMINB12, FOLATE, FERRITIN, TIBC, IRON, RETICCTPCT in the last 72 hours. Sepsis Labs: Recent Labs  Lab 09/18/20 1040  LATICACIDVEN 1.5    Recent Results (from the past 240 hour(s))  SARS Coronavirus 2 by RT PCR (hospital order, performed in [facility] hospital lab) Nasopharyngeal Nasopharyngeal Swab     Status: None   Collection Time: 09/10/20  8:31 AM   Specimen: Nasopharyngeal Swab  Result Value Ref Range Status   SARS Coronavirus 2 NEGATIVE NEGATIVE Final    Comment: (NOTE) SARS-CoV-2 target nucleic acids are NOT DETECTED.  The SARS-CoV-2 RNA is generally detectable in upper and lower respiratory specimens during the acute phase of infection. The lowest concentration of SARS-CoV-2 viral copies this assay can detect is 250 copies / mL. A negative result does not preclude SARS-CoV-2 infection and should not be used as the sole basis for treatment or other patient management decisions.  A negative result may occur with improper specimen collection / handling, submission of specimen other than nasopharyngeal swab, presence of viral mutation(s) within the areas targeted by this assay, and inadequate number of viral copies (<250 copies / mL). A negative result must be combined with clinical observations, patient history, and epidemiological information.  Fact Sheet for Patients:   StrictlyIdeas.no  Fact Sheet for Healthcare Providers: BankingDealers.co.za  This test is not yet approved or  cleared by the [place] FDA and has been authorized for detection and/or diagnosis of SARS-CoV-2 by FDA under an Emergency Use Authorization (EUA).  This EUA will remain in effect (meaning this test can be used) for the duration of the COVID-19 declaration under Section 564(b)(1) of the Act, 21 U.S.C. section 360bbb-3(b)(1), unless the  authorization is terminated or revoked sooner.  Performed at [facility], [place] [address], [place], [place] [address]   Resp Panel by RT-PCR (Flu A&B, Covid) Nasopharyngeal Swab     Status: None   Collection Time: 09/18/20  2:17 PM   Specimen: Nasopharyngeal Swab; Nasopharyngeal(NP) swabs in vial transport medium  Result Value Ref Range Status   SARS Coronavirus 2 by RT PCR NEGATIVE NEGATIVE Final    Comment: (NOTE) SARS-CoV-2 target nucleic acids are NOT DETECTED.  The SARS-CoV-2 RNA is generally detectable in upper respiratory specimens during the acute phase of infection. The lowest concentration of SARS-CoV-2 viral copies this assay can detect is 138 copies/mL. A negative result does not preclude SARS-Cov-2 infection and should not be used as the sole basis for treatment or other patient management decisions. A negative result may occur with  improper specimen collection/handling, submission of specimen other  than nasopharyngeal swab, presence of viral mutation(s) within the areas targeted by this assay, and inadequate number of viral copies(<138 copies/mL). A negative result must be combined with clinical observations, patient history, and epidemiological information. The expected result is Negative.  Fact Sheet for Patients:  EntrepreneurPulse.com.au  Fact Sheet for Healthcare Providers:  IncredibleEmployment.be  This test is no t yet approved or cleared by the [place] FDA and  has been authorized for detection and/or diagnosis of SARS-CoV-2 by FDA under an Emergency Use Authorization (EUA). This EUA will remain  in effect (meaning this test can be used) for the duration of the COVID-19 declaration under Section 564(b)(1) of the Act, 21 U.S.C.section 360bbb-3(b)(1), unless the authorization is terminated  or revoked sooner.       Influenza A by PCR NEGATIVE NEGATIVE Final   Influenza B by PCR NEGATIVE NEGATIVE Final     Comment: (NOTE) The Xpert Xpress SARS-CoV-2/FLU/RSV plus assay is intended as an aid in the diagnosis of influenza from Nasopharyngeal swab specimens and should not be used as a sole basis for treatment. Nasal washings and aspirates are unacceptable for Xpert Xpress SARS-CoV-2/FLU/RSV testing.  Fact Sheet for Patients: EntrepreneurPulse.com.au  Fact Sheet for Healthcare Providers: IncredibleEmployment.be  This test is not yet approved or cleared by the [place] FDA and has been authorized for detection and/or diagnosis of SARS-CoV-2 by FDA under an Emergency Use Authorization (EUA). This EUA will remain in effect (meaning this test can be used) for the duration of the COVID-19 declaration under Section 564(b)(1) of the Act, 21 U.S.C. section 360bbb-3(b)(1), unless the authorization is terminated or revoked.  Performed at [facility], [address] [place], [place] [address]          Radiology Studies: CT HEAD WO CONTRAST (5MM)  Result Date: 09/18/2020 CLINICAL DATA:  CT Head 01/15/2019. EXAM: CT HEAD WITHOUT CONTRAST TECHNIQUE: Contiguous axial images were obtained from the base of the skull through the vertex without intravenous contrast. COMPARISON:  None. FINDINGS: Brain: New large area of hypoattenuation and loss of gray-white differentiation in the posterior right frontal and parietal lobes, compatible with interval infarct. There is evidence of volume loss with mild ex vacuo ventricular dilation of the right posterolateral ventricle and temporal horn. No evidence of acute hemorrhage. No sizable extra-axial fluid collection. Postoperative changes of prior right frontal craniotomy. No hydrocephalus, midline shift, or mass lesion. Vascular: No hyperdense vessel identified. Calcific intracranial atherosclerosis. Skull: Postoperative changes of prior right frontal craniotomy. Sinuses/Orbits: Probable retention cysts in the right  frontal sinus and left sphenoid sinus. Otherwise, sinuses are largely clear. No acute orbital findings. Other: Moderate right mastoid effusion. IMPRESSION: 1. 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 If there is concern for recent infarct, recommend MRI. 2. Otherwise, no evidence of acute intracranial abnormality. Electronically Signed   By: Margaretha Sheffield M.D.   On: 09/18/2020 10:15   CT Cervical Spine Wo Contrast  Result Date: 09/18/2020 CLINICAL DATA:  Neck trauma (Age >= 65y) EXAM: CT CERVICAL SPINE WITHOUT CONTRAST TECHNIQUE: Multidetector CT imaging of the cervical spine was performed without intravenous contrast. Multiplanar CT image reconstructions were also generated. COMPARISON:  None.  CT cervical spine 12/30/2018. FINDINGS: Alignment: Similar alignment. Similar straightening of the normal cervical lordosis and mild anterolisthesis of C4 on C5. Skull base and vertebrae: No evidence of acute fracture. Vertebral body heights are similar to the prior. Soft tissues and spinal canal: No  prevertebral fluid or swelling. No visible canal hematoma. Disc levels: Similar multilevel degenerative disc disease, greatest at C5-C6 and C6-C7 where there is disc height loss and endplate spurring. Upper chest: Visualized lung apices are clear. Other: Calcific atherosclerosis of the carotids. IMPRESSION: 1. No evidence of acute fracture or traumatic malalignment. 2. Similar multilevel degenerative disc disease. Electronically Signed   By: Margaretha Sheffield M.D.   On: 09/18/2020 10:20   CT LUMBAR SPINE WO CONTRAST  Result Date: 09/18/2020 CLINICAL DATA:  Pelvic trauma EXAM: CT LUMBAR SPINE WITHOUT CONTRAST TECHNIQUE: Multidetector CT imaging of the lumbar spine was performed without intravenous contrast administration. Multiplanar CT image reconstructions were also generated. COMPARISON:  Lumbar radiographs 12/30/2018.  FINDINGS: Segmentation: 5 non rib-bearing lumbar vertebral bodies. Alignment: S shaped lumbar curvature (Dextrocurvature of the upper lumbar spine with levocurvature of the lower lumbar spine). No substantial sagittal subluxation. Vertebrae: No evidence of acute fracture. Chronic angulated fracture of the S3 vertebral body, partially imaged. Paraspinal and other soft tissues: Calcific atherosclerosis of the aorta and branch vessels. Poorly evaluated hypodense right renal lesion, possibly a cyst. Disc levels: Multilevel facet arthropathy/hypertrophy, greatest in the lower lumbar spine. Posterior disc bulges at multiple levels. IMPRESSION: 1. No evidence of acute fracture or traumatic malalignment. 2. Chronic angulated fracture of the S3 vertebral body, partially imaged. 3. S-shaped lumbar curvature with multilevel facet arthropathy/hypertrophy. MRI could further evaluate the canal/foramina if clinically indicated. 4. Osteopenia. Electronically Signed   By: Margaretha Sheffield M.D.   On: 09/18/2020 10:29   CT PELVIS WO CONTRAST  Result Date: 09/18/2020 CLINICAL DATA:  Pelvic trauma, fall, left hip pain EXAM: CT PELVIS WITHOUT CONTRAST TECHNIQUE: Multidetector CT imaging of the pelvis was performed following the standard protocol without intravenous contrast. COMPARISON:  06/12/2011 FINDINGS: Urinary Tract:  No abnormality visualized. Bowel: Unremarkable visualized pelvic bowel loops. Status post appendectomy. Vascular/Lymphatic: No pathologically enlarged lymph nodes. Aortic atherosclerosis Reproductive:  No mass or other significant abnormality Other:  None. Musculoskeletal: Osteopenia. Chronic fracture deformities of the left inferior pubic ramus (series 3, image 42). Nonacute, angulated fracture of the S3 segment (series 6, image 72). IMPRESSION: 1. Osteopenia. 2. Chronic fracture deformities of the left inferior pubic ramus. 3. Chronic, angulated fracture of the S3 segment. 4. No acute displaced fracture. Aortic  Atherosclerosis (ICD10-I70.0). Electronically Signed   By: Eddie Candle M.D.   On: 09/18/2020 10:12        Scheduled Meds:  amLODipine  2.5 mg Oral Daily   bacitracin   Topical BID   buPROPion  300 mg Oral QHS   cholecalciferol  2,000 Units Oral Daily   enoxaparin (LOVENOX) injection  40 mg Subcutaneous Q24H   feeding supplement  237 mL Oral BID BM   loratadine  10 mg Oral Daily   melatonin  2.5 mg Oral QHS   multivitamin with minerals  1 tablet Oral Daily   pantoprazole  40 mg Oral Daily   traZODone  50 mg Oral QHS   Continuous Infusions:  0.9 % NaCl with KCl 40 mEq / L 75 mL/hr at 09/20/20 0508   cefTRIAXone (ROCEPHIN)  IV Stopped (09/19/20 2351)     LOS: 2 days    Time spent: 30 mins    Wyvonnia Dusky, MD [facility] Pager [phone]  If 7PM-7AM, please contact night-coverage 09/20/2020, 7:43 AM

## 2020-09-20 NOTE — Progress Notes (Signed)
Pt keeps attempted to get out of chair and bed. Pt will not follow directions.pt keeps saying she needs to go down stairs and find janice. Educated pt on importance of staying in bed and/or chair. Fall precautions in place.

## 2020-09-20 NOTE — Progress Notes (Signed)
Patient became more confused after 0100. Patient easily redirected and oriented. Patient reminded to stay in bed and to call for help due to being a high fall risk. Continue to monitor.

## 2020-09-20 NOTE — Telephone Encounter (Signed)
Patient fell the the morning of her appointment and has been in the hospital since Tuesday. Vaughan Basta (sister) is inquiring about the next steps for care. Please call to advise [phone]. Thank you.

## 2020-09-21 ENCOUNTER — Telehealth: Payer: Self-pay

## 2020-09-21 LAB — BASIC METABOLIC PANEL
Anion gap: 7 (ref 5–15)
BUN: 11 mg/dL (ref 8–23)
CO2: 23 mmol/L (ref 22–32)
Calcium: 9.5 mg/dL (ref 8.9–10.3)
Chloride: 106 mmol/L (ref 98–111)
Creatinine, Ser: 1.24 mg/dL — ABNORMAL HIGH (ref 0.44–1.00)
GFR, Estimated: 43 mL/min — ABNORMAL LOW (ref >60)
Glucose, Bld: 65 mg/dL — ABNORMAL LOW (ref 70–99)
Potassium: 5 mmol/L (ref 3.5–5.1)
Sodium: 136 mmol/L (ref 135–145)

## 2020-09-21 LAB — CBC
HCT: 38.3 % (ref 36.0–46.0)
Hemoglobin: 11.8 g/dL — ABNORMAL LOW (ref 12.0–15.0)
MCH: 23.8 pg — ABNORMAL LOW (ref 26.0–34.0)
MCHC: 30.8 g/dL (ref 30.0–36.0)
MCV: 77.2 fL — ABNORMAL LOW (ref 80.0–100.0)
Platelets: 689 10*3/uL — ABNORMAL HIGH (ref 150–400)
RBC: 4.96 MIL/uL (ref 3.87–5.11)
RDW: 20.1 % — ABNORMAL HIGH (ref 11.5–15.5)
WBC: 11.9 10*3/uL — ABNORMAL HIGH (ref 4.0–10.5)
nRBC: 0 % (ref 0.0–0.2)

## 2020-09-21 LAB — URINE CULTURE: Culture: <10000 — AB

## 2020-09-21 MED ORDER — HALOPERIDOL LACTATE 5 MG/ML IJ SOLN
5.0000 mg | Freq: Four times a day (QID) | INTRAMUSCULAR | Status: DC | PRN
  Administered 2020-09-21: 5 mg via INTRAVENOUS

## 2020-09-21 MED ORDER — ENOXAPARIN SODIUM 30 MG/0.3ML IJ SOSY
30.0000 mg | PREFILLED_SYRINGE | INTRAMUSCULAR | Status: DC
  Administered 2020-09-21 – 2020-09-22 (×2): 30 mg via SUBCUTANEOUS
  Filled 2020-09-21 (×2): qty 0.3

## 2020-09-21 NOTE — Progress Notes (Signed)
PHARMACIST - PHYSICIAN COMMUNICATION  CONCERNING:  Enoxaparin (Lovenox) for DVT Prophylaxis    RECOMMENDATION: Patient was prescribed enoxaprin '40mg'$  q24 hours for VTE prophylaxis.   Filed Weights   09/19/20 0433 09/20/20 0401 09/21/20 0500  Weight: 48.1 kg (106 lb 0.7 oz) 47.5 kg (104 lb 11.5 oz) 47.4 kg (104 lb 8 oz)    Body mass index is 19.74 kg/m.  Estimated Creatinine Clearance: 25.7 mL/min (A) (by C-G formula based on SCr of 1.24 mg/dL (H)).  Patient is candidate for enoxaparin '30mg'$  every 24 hours based on CrCl <25m/min   DESCRIPTION: [facility]  Patient is now receiving enoxaparin 30 mg every 24 hours    CLu Duffel PharmD, BCPS Clinical Pharmacist 09/21/2020 8:48 AM

## 2020-09-21 NOTE — Care Management Important Message (Signed)
Important Message  Patient Details  Name: Tina Ingram MRN: [id number] Date of Birth: May 23, 1934   Medicare Important Message Given:  Yes  I reviewed the Important Message from Medicare with the patient and left a copy with her. I asked if she would sign my copy but she said she couldn't see without her glasses.  I let know that was okay and I would just note that I reviewed the form with her. I wished her a speedy recovery and thanked her for time.  Juliann Pulse A Annya Lizana 09/21/2020, 11:20 AM

## 2020-09-21 NOTE — TOC Progression Note (Signed)
Transition of Care [facility]) - Progression Note    Patient Details  Name: DEICI PATIENT MRN: [id number] Date of Birth: [birth year]  [facility] CM/SW Contact  Shelbie Hutching, RN Phone Number: 09/21/2020, 9:02 AM  Clinical Narrative:    [facility] in [place] offered a bed and patient and grandson accept bed offer.  Insurance authorization for SNF and EMS transport started through [employer].     Expected Discharge Plan: [place] Barriers to Discharge: Continued Medical Work up  Expected Discharge Plan and Services Expected Discharge Plan: [place]   Discharge Planning Services: CM Consult Post Acute Care Choice: [place] Living arrangements for the past 2 months: Single Family Home                 DME Arranged: N/A DME Agency: NA       HH Arranged: NA           Social Determinants of Health (SDOH) Interventions    Readmission Risk Interventions No flowsheet data found.

## 2020-09-21 NOTE — Progress Notes (Signed)
PROGRESS NOTE    Tina Ingram  [id number] DOB: 11-26-1934 DOA: 09/18/2020 PCP: Derinda Late, MD  Assessment & Plan:   Principal Problem:   UTI (urinary tract infection) Active Problems:   Hypokalemia   Essential hypertension   Skin cancer   Fall at home, initial encounter   Generalized weakness   GERD without esophagitis  UTI: urine cx showed insignificant growth. Completed abx course    Multiple falls: likely secondary to deconditioning & UTI. PT/OT recs SNF.  Waiting on SNF placement    Hypokalemia: WNL today    Leukocytosis: continues to trend down    HTN: continue on home dose of amlodipine. Continue to hold HCTZ.   GERD: continue on PPI   Skin lesion/cancer: scab in place on scalp. Continue w/ wound care   Thrombocytosis: etiology unclear, labile. Will continue to monitor   DVT prophylaxis: lovenox Code Status: partial  Family Communication:  Disposition Plan: likely d/c to SNF  Level of care: Med-Surg  Status is: Inpatient  Remains inpatient appropriate because:Unsafe d/c plan, IV treatments appropriate due to intensity of illness or inability to take PO, and Inpatient level of care appropriate due to severity of illness  Dispo: The patient is from: Home              Anticipated d/c is to: SNF              Patient currently is not medically stable to d/c.   Difficult to place patient : unclear      Consultants:    Procedures:   Antimicrobials: rocephin   Subjective: Pt c/o fatigue   Objective: Vitals:   09/20/20 1933 09/20/20 2309 09/21/20 0441 09/21/20 0500  BP: (!) 162/76  (!) 149/62   Pulse: 88 72 81   Resp: '16 15 16   '$ Temp: 97.8 F (36.6 C)  97.6 F (36.4 C)   TempSrc:      SpO2: 96%  97%   Weight:    47.4 kg  Height:        Intake/Output Summary (Last 24 hours) at 09/21/2020 0737 Last data filed at 09/20/2020 2030 Gross per 24 hour  Intake 305.84 ml  Output 550 ml  Net -244.16 ml   Filed Weights   09/19/20 0433  09/20/20 0401 09/21/20 0500  Weight: 48.1 kg 47.5 kg 47.4 kg    Examination:  General exam: Appears calm & comfortable  Respiratory system: clear breath sounds b/l. No rubs  Cardiovascular system: S1 & S2+. No rubs or clicks  Gastrointestinal system: Abd is soft, NT, ND & hypoactive bowel sounds  Psychiatry: Judgement and insight appear normal. Appropriate mood and affect     Data Reviewed: I have personally reviewed following labs and imaging studies  CBC: Recent Labs  Lab 09/18/20 1040 09/19/20 0414 09/20/20 0533 09/21/20 0616  WBC 16.4* 13.7* 12.1* 11.9*  NEUTROABS 14.5*  --   --   --   HGB 13.6 11.6* 12.1 11.8*  HCT 43.9 36.2 37.9 38.3  MCV 75.2* 74.2* 75.0* 77.2*  PLT 759* 627* 678* 123XX123*   Basic Metabolic Panel: Recent Labs  Lab 09/18/20 1040 09/19/20 0414 09/20/20 0533 09/21/20 0616  NA 136 139 138 136  K 3.2* 4.0 4.5 5.0  CL 98 107 106 106  CO2 '28 26 26 23  '$ GLUCOSE 93 61* 71 65*  BUN '13 11 10 11  '$ CREATININE 1.13* 1.11* 0.98 1.24*  CALCIUM 9.3 8.7* 8.9 9.5   GFR: Estimated Creatinine Clearance: 25.7  mL/min (A) (by C-G formula based on SCr of 1.24 mg/dL (H)). Liver Function Tests: Recent Labs  Lab 09/18/20 1040 09/19/20 0414  AST 21 15  ALT 10 8  ALKPHOS 81 60  BILITOT 1.4* 1.2  PROT 6.6 5.6*  ALBUMIN 3.7 3.0*   No results for input(s): LIPASE, AMYLASE in the last 168 hours. No results for input(s): AMMONIA in the last 168 hours. Coagulation Profile: No results for input(s): INR, PROTIME in the last 168 hours. Cardiac Enzymes: No results for input(s): CKTOTAL, CKMB, CKMBINDEX, TROPONINI in the last 168 hours. BNP (last 3 results) No results for input(s): PROBNP in the last 8760 hours. HbA1C: No results for input(s): HGBA1C in the last 72 hours. CBG: No results for input(s): GLUCAP in the last 168 hours. Lipid Profile: No results for input(s): CHOL, HDL, LDLCALC, TRIG, CHOLHDL, LDLDIRECT in the last 72 hours. Thyroid Function Tests: No  results for input(s): TSH, T4TOTAL, FREET4, T3FREE, THYROIDAB in the last 72 hours. Anemia Panel: No results for input(s): VITAMINB12, FOLATE, FERRITIN, TIBC, IRON, RETICCTPCT in the last 72 hours. Sepsis Labs: Recent Labs  Lab 09/18/20 1040  LATICACIDVEN 1.5    Recent Results (from the past 240 hour(s))  Resp Panel by RT-PCR (Flu A&B, Covid) Nasopharyngeal Swab     Status: None   Collection Time: 09/18/20  2:17 PM   Specimen: Nasopharyngeal Swab; Nasopharyngeal(NP) swabs in vial transport medium  Result Value Ref Range Status   SARS Coronavirus 2 by RT PCR NEGATIVE NEGATIVE Final    Comment: (NOTE) SARS-CoV-2 target nucleic acids are NOT DETECTED.  The SARS-CoV-2 RNA is generally detectable in upper respiratory specimens during the acute phase of infection. The lowest concentration of SARS-CoV-2 viral copies this assay can detect is 138 copies/mL. A negative result does not preclude SARS-Cov-2 infection and should not be used as the sole basis for treatment or other patient management decisions. A negative result may occur with  improper specimen collection/handling, submission of specimen other than nasopharyngeal swab, presence of viral mutation(s) within the areas targeted by this assay, and inadequate number of viral copies(<138 copies/mL). A negative result must be combined with clinical observations, patient history, and epidemiological information. The expected result is Negative.  Fact Sheet for Patients:  EntrepreneurPulse.com.au  Fact Sheet for Healthcare Providers:  IncredibleEmployment.be  This test is no t yet approved or cleared by the [place] FDA and  has been authorized for detection and/or diagnosis of SARS-CoV-2 by FDA under an Emergency Use Authorization (EUA). This EUA will remain  in effect (meaning this test can be used) for the duration of the COVID-19 declaration under Section 564(b)(1) of the Act,  21 U.S.C.section 360bbb-3(b)(1), unless the authorization is terminated  or revoked sooner.       Influenza A by PCR NEGATIVE NEGATIVE Final   Influenza B by PCR NEGATIVE NEGATIVE Final    Comment: (NOTE) The Xpert Xpress SARS-CoV-2/FLU/RSV plus assay is intended as an aid in the diagnosis of influenza from Nasopharyngeal swab specimens and should not be used as a sole basis for treatment. Nasal washings and aspirates are unacceptable for Xpert Xpress SARS-CoV-2/FLU/RSV testing.  Fact Sheet for Patients: EntrepreneurPulse.com.au  Fact Sheet for Healthcare Providers: IncredibleEmployment.be  This test is not yet approved or cleared by the [place] FDA and has been authorized for detection and/or diagnosis of SARS-CoV-2 by FDA under an Emergency Use Authorization (EUA). This EUA will remain in effect (meaning this test can be used) for the duration of the COVID-19  declaration under Section 564(b)(1) of the Act, 21 U.S.C. section 360bbb-3(b)(1), unless the authorization is terminated or revoked.  Performed at [facility], [address] [place], [place] [address]   Urine Culture     Status: Abnormal   Collection Time: 09/19/20  6:03 PM   Specimen: Urine, Clean Catch  Result Value Ref Range Status   Specimen Description   Final    URINE, CLEAN CATCH Performed at [facility], [address], [place], [place] [address]    Special Requests   Final    NONE Performed at [facility], [address], [place], [place] [address]    Culture (A)  Final    <10,000 COLONIES/mL INSIGNIFICANT GROWTH Performed at [facility] [address] [place], [place] [address]    Report Status 09/21/2020 FINAL  Final         Radiology Studies: No results found.      Scheduled Meds:  amLODipine  2.5 mg Oral Daily   bacitracin   Topical BID   buPROPion  300 mg Oral QHS   cholecalciferol  2,000 Units  Oral Daily   enoxaparin (LOVENOX) injection  40 mg Subcutaneous Q24H   feeding supplement  237 mL Oral BID BM   loratadine  10 mg Oral Daily   melatonin  2.5 mg Oral QHS   multivitamin with minerals  1 tablet Oral Daily   pantoprazole  40 mg Oral Daily   traZODone  50 mg Oral QHS   Continuous Infusions:  cefTRIAXone (ROCEPHIN)  IV Stopped (09/20/20 1902)     LOS: 3 days    Time spent: 25 mins    Wyvonnia Dusky, MD [facility] Pager [phone]  If 7PM-7AM, please contact night-coverage 09/21/2020, 7:37 AM

## 2020-09-21 NOTE — Telephone Encounter (Signed)
Spoke to pt's sister, Vaughan Basta and adv her to call our office to reschedule f/u appt once pt is discharged from the hospital. Vaughan Basta stated that they don't believe that pt will get to return home and that she's been recommended to stay at a facility. I adv her that according to Matt's last note regarding dressings x pt's most recent excision from [place] last month, it does not need to be covered and abx ointment can be applied. Adv her to ask the nurse for some and she can apply it herself or the nurse can do it. Vaughan Basta conveyed understanding. Adv her that if pt gets moved to a facility or has any other questions, please forward our contact info to them and we'd assist them with their questions. She conveyed understanding.

## 2020-09-21 NOTE — Telephone Encounter (Signed)
Please see 9/8 telephone note x updates.

## 2020-09-21 NOTE — Telephone Encounter (Signed)
Patient's sister, Chauncey Fischer, called to let us know that the patient is in the hospital.  Vaughan Basta said that she was concerned about the patient's incision area.  She said when she mentioned it to the nurses who were taking care of her in the hospital, they said that they didn't know anything about it.  Vaughan Basta asked that we please call her and also please let the nurses who are taking care of the patient in the hospital know what they need to do to take care of the incision site.

## 2020-09-21 NOTE — Plan of Care (Signed)
Patient oriented to self only, VSS, remains on RA. Impulsive and at times difficult to redirect. Able to sleep several hours following trazodone and melatonin, though would occasionally wake and attempt to exit the bed, frequently stating needing to get to the kitchen or close her porch door. Adequate UO, no BM this shift. Bacitracin applied to scalp wound per orders, tolerated well. Turns self in bed. Fall/safety precautions in place, rounding performed, needs/concerns addressed during shift.   Problem: Education: Goal: Knowledge of General Education information will improve Description: Including pain rating scale, medication(s)/side effects and non-pharmacologic comfort measures Outcome: Progressing   Problem: Health Behavior/Discharge Planning: Goal: Ability to manage health-related needs will improve Outcome: Progressing   Problem: Clinical Measurements: Goal: Ability to maintain clinical measurements within normal limits will improve Outcome: Progressing Goal: Will remain free from infection Outcome: Progressing Goal: Diagnostic test results will improve Outcome: Progressing Goal: Respiratory complications will improve Outcome: Progressing Goal: Cardiovascular complication will be avoided Outcome: Progressing   Problem: Activity: Goal: Risk for activity intolerance will decrease Outcome: Progressing   Problem: Nutrition: Goal: Adequate nutrition will be maintained Outcome: Progressing   Problem: Coping: Goal: Level of anxiety will decrease Outcome: Progressing   Problem: Elimination: Goal: Will not experience complications related to bowel motility Outcome: Progressing Goal: Will not experience complications related to urinary retention Outcome: Progressing   Problem: Pain Managment: Goal: General experience of comfort will improve Outcome: Progressing   Problem: Safety: Goal: Ability to remain free from injury will improve Outcome: Progressing   Problem: Skin  Integrity: Goal: Risk for impaired skin integrity will decrease Outcome: Progressing   Problem: Urinary Elimination: Goal: Signs and symptoms of infection will decrease Outcome: Progressing

## 2020-09-22 LAB — BASIC METABOLIC PANEL
Anion gap: 6 (ref 5–15)
BUN: 14 mg/dL (ref 8–23)
CO2: 25 mmol/L (ref 22–32)
Calcium: 9.3 mg/dL (ref 8.9–10.3)
Chloride: 107 mmol/L (ref 98–111)
Creatinine, Ser: 1.2 mg/dL — ABNORMAL HIGH (ref 0.44–1.00)
GFR, Estimated: 45 mL/min — ABNORMAL LOW (ref >60)
Glucose, Bld: 77 mg/dL (ref 70–99)
Potassium: 3.9 mmol/L (ref 3.5–5.1)
Sodium: 138 mmol/L (ref 135–145)

## 2020-09-22 LAB — CBC
HCT: 39.2 % (ref 36.0–46.0)
Hemoglobin: 11.7 g/dL — ABNORMAL LOW (ref 12.0–15.0)
MCH: 22.9 pg — ABNORMAL LOW (ref 26.0–34.0)
MCHC: 29.8 g/dL — ABNORMAL LOW (ref 30.0–36.0)
MCV: 76.7 fL — ABNORMAL LOW (ref 80.0–100.0)
Platelets: 766 10*3/uL — ABNORMAL HIGH (ref 150–400)
RBC: 5.11 MIL/uL (ref 3.87–5.11)
RDW: 20.3 % — ABNORMAL HIGH (ref 11.5–15.5)
WBC: 10.6 10*3/uL — ABNORMAL HIGH (ref 4.0–10.5)
nRBC: 0 % (ref 0.0–0.2)

## 2020-09-22 NOTE — Progress Notes (Signed)
PROGRESS NOTE    Tina Ingram  [id number] DOB: November 21, 1934 DOA: 09/18/2020 PCP: Derinda Late, MD  Assessment & Plan:   Principal Problem:   UTI (urinary tract infection) Active Problems:   Hypokalemia   Essential hypertension   Skin cancer   Fall at home, initial encounter   Generalized weakness   GERD without esophagitis  UTI: completed abx course. Urine cx showed insignificant course.    Multiple falls: likely secondary to deconditioning & UTI. PT/OT recs SNF.  Waiting on SNF placement    Hypokalemia: within normal limits today    Leukocytosis: trending down daily   HTN: continue on home dose of CCB. Continue to hold HCTZ  GERD: continue on PPI   Skin lesion/cancer: scab in place on scalp.  Continue w/ wound care  Thrombocytosis: labile, etiology unclear. Will continue to monitor  DVT prophylaxis: lovenox Code Status: partial  Family Communication:  Disposition Plan: likely d/c to SNF  Level of care: Med-Surg  Status is: Inpatient  Remains inpatient appropriate because:Unsafe d/c plan, IV treatments appropriate due to intensity of illness or inability to take PO, and Inpatient level of care appropriate due to severity of illness, waiting on insurance auth  Dispo: The patient is from: Home              Anticipated d/c is to: SNF              Patient currently is medically stable for d/c    Difficult to place patient : unclear      Consultants:    Procedures:   Antimicrobials:    Subjective: Pt c/o malaise   Objective: Vitals:   09/21/20 1945 09/22/20 0413 09/22/20 0516 09/22/20 0741  BP: 138/60  (!) 142/65 (!) 175/77  Pulse: 94  83 92  Resp: '18  18 16  '$ Temp: 98.1 F (36.7 C)  97.7 F (36.5 C) 97.7 F (36.5 C)  TempSrc: Oral     SpO2: 96%  97% 97%  Weight:  48.2 kg    Height:        Intake/Output Summary (Last 24 hours) at 09/22/2020 0747 Last data filed at 09/21/2020 1945 Gross per 24 hour  Intake 118 ml  Output 650 ml  Net  -532 ml   Filed Weights   09/20/20 0401 09/21/20 0500 09/22/20 0413  Weight: 47.5 kg 47.4 kg 48.2 kg    Examination:  General exam: Appears comfortable. Frail appearing  Respiratory system: clear breath sounds b/l  Cardiovascular system: S1/S2+. No clicks or gallops Gastrointestinal system: Abd is soft, NT, ND & hypoactive bowel sounds  Psychiatry: Judgement and insight appear normal. Flat mood and affect    Data Reviewed: I have personally reviewed following labs and imaging studies  CBC: Recent Labs  Lab 09/18/20 1040 09/19/20 0414 09/20/20 0533 09/21/20 0616 09/22/20 0531  WBC 16.4* 13.7* 12.1* 11.9* 10.6*  NEUTROABS 14.5*  --   --   --   --   HGB 13.6 11.6* 12.1 11.8* 11.7*  HCT 43.9 36.2 37.9 38.3 39.2  MCV 75.2* 74.2* 75.0* 77.2* 76.7*  PLT 759* 627* 678* 689* 123456*   Basic Metabolic Panel: Recent Labs  Lab 09/18/20 1040 09/19/20 0414 09/20/20 0533 09/21/20 0616 09/22/20 0531  NA 136 139 138 136 138  K 3.2* 4.0 4.5 5.0 3.9  CL 98 107 106 106 107  CO2 '28 26 26 23 25  '$ GLUCOSE 93 61* 71 65* 77  BUN '13 11 10 11 14  '$ CREATININE  1.13* 1.11* 0.98 1.24* 1.20*  CALCIUM 9.3 8.7* 8.9 9.5 9.3   GFR: Estimated Creatinine Clearance: 26.8 mL/min (A) (by C-G formula based on SCr of 1.2 mg/dL (H)). Liver Function Tests: Recent Labs  Lab 09/18/20 1040 09/19/20 0414  AST 21 15  ALT 10 8  ALKPHOS 81 60  BILITOT 1.4* 1.2  PROT 6.6 5.6*  ALBUMIN 3.7 3.0*   No results for input(s): LIPASE, AMYLASE in the last 168 hours. No results for input(s): AMMONIA in the last 168 hours. Coagulation Profile: No results for input(s): INR, PROTIME in the last 168 hours. Cardiac Enzymes: No results for input(s): CKTOTAL, CKMB, CKMBINDEX, TROPONINI in the last 168 hours. BNP (last 3 results) No results for input(s): PROBNP in the last 8760 hours. HbA1C: No results for input(s): HGBA1C in the last 72 hours. CBG: No results for input(s): GLUCAP in the last 168 hours. Lipid  Profile: No results for input(s): CHOL, HDL, LDLCALC, TRIG, CHOLHDL, LDLDIRECT in the last 72 hours. Thyroid Function Tests: No results for input(s): TSH, T4TOTAL, FREET4, T3FREE, THYROIDAB in the last 72 hours. Anemia Panel: No results for input(s): VITAMINB12, FOLATE, FERRITIN, TIBC, IRON, RETICCTPCT in the last 72 hours. Sepsis Labs: Recent Labs  Lab 09/18/20 1040  LATICACIDVEN 1.5    Recent Results (from the past 240 hour(s))  Resp Panel by RT-PCR (Flu A&B, Covid) Nasopharyngeal Swab     Status: None   Collection Time: 09/18/20  2:17 PM   Specimen: Nasopharyngeal Swab; Nasopharyngeal(NP) swabs in vial transport medium  Result Value Ref Range Status   SARS Coronavirus 2 by RT PCR NEGATIVE NEGATIVE Final    Comment: (NOTE) SARS-CoV-2 target nucleic acids are NOT DETECTED.  The SARS-CoV-2 RNA is generally detectable in upper respiratory specimens during the acute phase of infection. The lowest concentration of SARS-CoV-2 viral copies this assay can detect is 138 copies/mL. A negative result does not preclude SARS-Cov-2 infection and should not be used as the sole basis for treatment or other patient management decisions. A negative result may occur with  improper specimen collection/handling, submission of specimen other than nasopharyngeal swab, presence of viral mutation(s) within the areas targeted by this assay, and inadequate number of viral copies(<138 copies/mL). A negative result must be combined with clinical observations, patient history, and epidemiological information. The expected result is Negative.  Fact Sheet for Patients:  EntrepreneurPulse.com.au  Fact Sheet for Healthcare Providers:  IncredibleEmployment.be  This test is no t yet approved or cleared by the [place] FDA and  has been authorized for detection and/or diagnosis of SARS-CoV-2 by FDA under an Emergency Use Authorization (EUA). This EUA will remain  in  effect (meaning this test can be used) for the duration of the COVID-19 declaration under Section 564(b)(1) of the Act, 21 U.S.C.section 360bbb-3(b)(1), unless the authorization is terminated  or revoked sooner.       Influenza A by PCR NEGATIVE NEGATIVE Final   Influenza B by PCR NEGATIVE NEGATIVE Final    Comment: (NOTE) The Xpert Xpress SARS-CoV-2/FLU/RSV plus assay is intended as an aid in the diagnosis of influenza from Nasopharyngeal swab specimens and should not be used as a sole basis for treatment. Nasal washings and aspirates are unacceptable for Xpert Xpress SARS-CoV-2/FLU/RSV testing.  Fact Sheet for Patients: EntrepreneurPulse.com.au  Fact Sheet for Healthcare Providers: IncredibleEmployment.be  This test is not yet approved or cleared by the [place] FDA and has been authorized for detection and/or diagnosis of SARS-CoV-2 by FDA under an Emergency Use Authorization (  EUA). This EUA will remain in effect (meaning this test can be used) for the duration of the COVID-19 declaration under Section 564(b)(1) of the Act, 21 U.S.C. section 360bbb-3(b)(1), unless the authorization is terminated or revoked.  Performed at [facility], [address] [place], [place] [address]   Urine Culture     Status: Abnormal   Collection Time: 09/19/20  6:03 PM   Specimen: Urine, Clean Catch  Result Value Ref Range Status   Specimen Description   Final    URINE, CLEAN CATCH Performed at [facility], [address], [place], [place] [address]    Special Requests   Final    NONE Performed at [facility], [address], [place], [place] [address]    Culture (A)  Final    <10,000 COLONIES/mL INSIGNIFICANT GROWTH Performed at [facility] [address], [place], [place] [address]    Report Status 09/21/2020 FINAL  Final         Radiology Studies: No results found.      Scheduled Meds:   amLODipine  2.5 mg Oral Daily   bacitracin   Topical BID   buPROPion  300 mg Oral QHS   cholecalciferol  2,000 Units Oral Daily   enoxaparin (LOVENOX) injection  30 mg Subcutaneous Q24H   feeding supplement  237 mL Oral BID BM   loratadine  10 mg Oral Daily   melatonin  2.5 mg Oral QHS   multivitamin with minerals  1 tablet Oral Daily   pantoprazole  40 mg Oral Daily   traZODone  50 mg Oral QHS   Continuous Infusions:     LOS: 4 days    Time spent: 20 mins    Wyvonnia Dusky, MD [facility] Pager [phone]  If 7PM-7AM, please contact night-coverage 09/22/2020, 7:47 AM

## 2020-09-22 NOTE — Plan of Care (Signed)
Patient mentation much improved compared to prior nights, oriented x4 though forgetful at times. VSS. OOB to [facility] with x1 assistance and unsteady gait. Adequate UO, x1 BM overnight. Bacitracin ointment applied to scalp per orders. Fall/safety precautions in place, rounding performed, needs/concerns addressed.   Problem: Education: Goal: Knowledge of General Education information will improve Description: Including pain rating scale, medication(s)/side effects and non-pharmacologic comfort measures Outcome: Progressing   Problem: Health Behavior/Discharge Planning: Goal: Ability to manage health-related needs will improve Outcome: Progressing   Problem: Clinical Measurements: Goal: Ability to maintain clinical measurements within normal limits will improve Outcome: Progressing Goal: Will remain free from infection Outcome: Progressing Goal: Diagnostic test results will improve Outcome: Progressing Goal: Respiratory complications will improve Outcome: Progressing Goal: Cardiovascular complication will be avoided Outcome: Progressing   Problem: Activity: Goal: Risk for activity intolerance will decrease Outcome: Progressing   Problem: Nutrition: Goal: Adequate nutrition will be maintained Outcome: Progressing   Problem: Coping: Goal: Level of anxiety will decrease Outcome: Progressing   Problem: Elimination: Goal: Will not experience complications related to bowel motility Outcome: Progressing Goal: Will not experience complications related to urinary retention Outcome: Progressing   Problem: Pain Managment: Goal: General experience of comfort will improve Outcome: Progressing   Problem: Safety: Goal: Ability to remain free from injury will improve Outcome: Progressing   Problem: Skin Integrity: Goal: Risk for impaired skin integrity will decrease Outcome: Progressing   Problem: Urinary Elimination: Goal: Signs and symptoms of infection will decrease Outcome:  Progressing

## 2020-09-22 NOTE — TOC Progression Note (Signed)
Transition of Care [facility]) - Progression Note    Patient Details  Name: Tina Ingram MRN: [id number] Date of Birth: 07/05/1934  [facility] CM/SW Contact  Tina Ingram Dorian Pod, RN Phone [phone] 09/22/2020, 3:01 PM  Clinical Narrative:    Tina Ingram from [facility] called back and indicated pt can be admitted tomorrow possibly before 12:00pm. Attending aware to complete the d/c summary with updated medications for SNF. SNF requested a call tomorrow for bed placement and report on this pt [phone] (Tina Ingram-cell).  Will alert covering TOC staff on this information for transfer Sunday to [facility]. [employer] [id number] [facility] [id number]   Expected Discharge Plan: Skilled Nursing Facility Barriers to Discharge: Continued Medical Work up  Expected Discharge Plan and Services Expected Discharge Plan: [place]   Discharge Planning Services: CM Consult Post Acute Care Choice: [place] Living arrangements for the past 2 months: Single Family Home                 DME Arranged: N/A DME Agency: NA       HH Arranged: NA           Social Determinants of Health (SDOH) Interventions    Readmission Risk Interventions No flowsheet data found.

## 2020-09-22 NOTE — TOC Progression Note (Signed)
Transition of Care [facility]) - Progression Note    Patient Details  Name: Tina Ingram MRN: [id number] Date of Birth: Oct 15, 1934  Transition of Care [facility]) CM/SW Contact  Tina Ingram Dorian Pod, RN Phone Number:[phone] 09/22/2020, 2:41 PM  Clinical Narrative:    Spoke with pt's grandson Tina Ingram) concerning authorization via [place] (HTA-Tina Ingram) for [facility] (SNF) [phone] and [facility] (transportation) [phone] from 9/9-9/15/2022. TOC RN attempted outreach to [facility] Tina Ingram) for update on admission however mailbox full and unable to leave a message. Another source called Tina Ingram (Tina Ingram) able to leave a HIPAA approved voice message requesting a call back via [place] [facility].  Attending aware and updated on the above .   TOC team will continue outreach calls to the SNF for pending placement.   Expected Discharge Plan: [place] Barriers to Discharge: Continued Medical Work up  Expected Discharge Plan and Services Expected Discharge Plan: [place]   Discharge Planning Services: CM Consult Post Acute Care Choice: [place] Living arrangements for the past 2 months: Single Family Home                 DME Arranged: N/A DME Agency: NA       HH Arranged: NA           Social Determinants of Health (SDOH) Interventions    Readmission Risk Interventions No flowsheet data found.

## 2020-09-23 DIAGNOSIS — N179 Acute kidney failure, unspecified: Secondary | ICD-10-CM | POA: Diagnosis not present

## 2020-09-23 DIAGNOSIS — N3000 Acute cystitis without hematuria: Secondary | ICD-10-CM | POA: Diagnosis not present

## 2020-09-23 DIAGNOSIS — J302 Other seasonal allergic rhinitis: Secondary | ICD-10-CM | POA: Diagnosis not present

## 2020-09-23 DIAGNOSIS — D75839 Thrombocytosis, unspecified: Secondary | ICD-10-CM | POA: Diagnosis not present

## 2020-09-23 DIAGNOSIS — G4700 Insomnia, unspecified: Secondary | ICD-10-CM | POA: Diagnosis not present

## 2020-09-23 DIAGNOSIS — Y92009 Unspecified place in unspecified non-institutional (private) residence as the place of occurrence of the external cause: Secondary | ICD-10-CM | POA: Diagnosis not present

## 2020-09-23 DIAGNOSIS — F3289 Other specified depressive episodes: Secondary | ICD-10-CM | POA: Diagnosis not present

## 2020-09-23 DIAGNOSIS — M150 Primary generalized (osteo)arthritis: Secondary | ICD-10-CM | POA: Diagnosis not present

## 2020-09-23 DIAGNOSIS — K5909 Other constipation: Secondary | ICD-10-CM | POA: Diagnosis not present

## 2020-09-23 DIAGNOSIS — E876 Hypokalemia: Secondary | ICD-10-CM | POA: Diagnosis not present

## 2020-09-23 DIAGNOSIS — R000 Tachycardia, unspecified: Secondary | ICD-10-CM | POA: Diagnosis not present

## 2020-09-23 DIAGNOSIS — D72829 Elevated white blood cell count, unspecified: Secondary | ICD-10-CM | POA: Diagnosis not present

## 2020-09-23 DIAGNOSIS — N183 Chronic kidney disease, stage 3 unspecified: Secondary | ICD-10-CM | POA: Diagnosis not present

## 2020-09-23 DIAGNOSIS — I10 Essential (primary) hypertension: Secondary | ICD-10-CM | POA: Diagnosis not present

## 2020-09-23 DIAGNOSIS — B952 Enterococcus as the cause of diseases classified elsewhere: Secondary | ICD-10-CM | POA: Diagnosis not present

## 2020-09-23 DIAGNOSIS — R52 Pain, unspecified: Secondary | ICD-10-CM | POA: Diagnosis not present

## 2020-09-23 DIAGNOSIS — E568 Deficiency of other vitamins: Secondary | ICD-10-CM | POA: Diagnosis not present

## 2020-09-23 DIAGNOSIS — R531 Weakness: Secondary | ICD-10-CM | POA: Diagnosis not present

## 2020-09-23 DIAGNOSIS — R41841 Cognitive communication deficit: Secondary | ICD-10-CM | POA: Diagnosis not present

## 2020-09-23 DIAGNOSIS — C4442 Squamous cell carcinoma of skin of scalp and neck: Secondary | ICD-10-CM | POA: Diagnosis not present

## 2020-09-23 DIAGNOSIS — R1311 Dysphagia, oral phase: Secondary | ICD-10-CM | POA: Diagnosis not present

## 2020-09-23 DIAGNOSIS — N390 Urinary tract infection, site not specified: Secondary | ICD-10-CM | POA: Diagnosis not present

## 2020-09-23 DIAGNOSIS — K5900 Constipation, unspecified: Secondary | ICD-10-CM | POA: Diagnosis not present

## 2020-09-23 DIAGNOSIS — W19XXXD Unspecified fall, subsequent encounter: Secondary | ICD-10-CM | POA: Diagnosis not present

## 2020-09-23 DIAGNOSIS — M6281 Muscle weakness (generalized): Secondary | ICD-10-CM | POA: Diagnosis not present

## 2020-09-23 DIAGNOSIS — W1789XA Other fall from one level to another, initial encounter: Secondary | ICD-10-CM | POA: Diagnosis not present

## 2020-09-23 DIAGNOSIS — R0902 Hypoxemia: Secondary | ICD-10-CM | POA: Diagnosis not present

## 2020-09-23 DIAGNOSIS — K219 Gastro-esophageal reflux disease without esophagitis: Secondary | ICD-10-CM | POA: Diagnosis not present

## 2020-09-23 DIAGNOSIS — W19XXXA Unspecified fall, initial encounter: Secondary | ICD-10-CM | POA: Diagnosis not present

## 2020-09-23 DIAGNOSIS — C4440 Unspecified malignant neoplasm of skin of scalp and neck: Secondary | ICD-10-CM | POA: Diagnosis not present

## 2020-09-23 LAB — BASIC METABOLIC PANEL
Anion gap: 7 (ref 5–15)
BUN: 13 mg/dL (ref 8–23)
CO2: 27 mmol/L (ref 22–32)
Calcium: 9.8 mg/dL (ref 8.9–10.3)
Chloride: 105 mmol/L (ref 98–111)
Creatinine, Ser: 1.23 mg/dL — ABNORMAL HIGH (ref 0.44–1.00)
GFR, Estimated: 44 mL/min — ABNORMAL LOW (ref >60)
Glucose, Bld: 97 mg/dL (ref 70–99)
Potassium: 4 mmol/L (ref 3.5–5.1)
Sodium: 139 mmol/L (ref 135–145)

## 2020-09-23 LAB — CBC
HCT: 45.7 % (ref 36.0–46.0)
Hemoglobin: 14 g/dL (ref 12.0–15.0)
MCH: 23 pg — ABNORMAL LOW (ref 26.0–34.0)
MCHC: 30.6 g/dL (ref 30.0–36.0)
MCV: 75 fL — ABNORMAL LOW (ref 80.0–100.0)
Platelets: 833 10*3/uL — ABNORMAL HIGH (ref 150–400)
RBC: 6.09 MIL/uL — ABNORMAL HIGH (ref 3.87–5.11)
RDW: 20.8 % — ABNORMAL HIGH (ref 11.5–15.5)
WBC: 13.2 10*3/uL — ABNORMAL HIGH (ref 4.0–10.5)
nRBC: 0 % (ref 0.0–0.2)

## 2020-09-23 LAB — RESP PANEL BY RT-PCR (FLU A&B, COVID) ARPGX2
Influenza A by PCR: NEGATIVE
Influenza B by PCR: NEGATIVE
SARS Coronavirus 2 by RT PCR: NEGATIVE

## 2020-09-23 MED ORDER — ACETAMINOPHEN 325 MG PO TABS: 650.0000 mg | ORAL_TABLET | Freq: Four times a day (QID) | ORAL | Status: DC | PRN

## 2020-09-23 MED ORDER — BACITRACIN ZINC 500 UNIT/GM EX OINT: TOPICAL_OINTMENT | Freq: Two times a day (BID) | CUTANEOUS | 0 refills | Status: DC

## 2020-09-23 MED ORDER — HYDRALAZINE HCL 20 MG/ML IJ SOLN: 20.0000 mg | Freq: Four times a day (QID) | INTRAMUSCULAR | Status: DC | PRN

## 2020-09-23 NOTE — Discharge Summary (Addendum)
Physician Discharge Summary  Tina Ingram [id number] DOB: 08-Aug-1934 DOA: 09/18/2020  PCP: Tina Late, MD  Admit date: 09/18/2020 Discharge date: 09/23/2020  Admitted From: home  Disposition:  SNF  Recommendations for Outpatient Follow-up:  Follow up with PCP in 1-2 weeks  Home Health: no  Equipment/Devices:  Discharge Condition: stable  CODE STATUS: full  Diet recommendation: Heart Healthy   Brief/Interim Summary: HPI was taken from Dr. Dwyane Ingram: Tina Ingram is a 86 y.o. female with medical history significant of scalp cancer recent really removed presents via EMS status post fall.  Patient was trying to get up from sitting and standing fell onto her left hip and complains of left hip pain along with some headache..  Patient not on any anticoagulation or antiplatelets.  Patient denies any loss of consciousness.  Blood pressure labile in the ED 100s over 180s over 70s over 80s.  Potassium 3.2 creatinine is 1.13 from 1.49T bili 1.4 GFR 48.  White count of 16 H&H 13 and 43 platelets 759.  Normal sinus rhythm.  CT of the head showing interval large right frontoparietal infarct likely chronic or subacute given evidence of volume loss.  Cervical spine disc disease but no acute fracture.  Lumbar spine CT chronic angulated fracture of S3 vertebral body S-shaped lumbar curvature MRI could further indicate canal foramina if indicated.  Osteopenia.  No acute fracture on pelvis CT.  UA is positive for nitrites trace leuks.  Patient given Tylenol and 500cc ns in ed. EKG normal sinus.  Patient sister at bedside who provide history of Ms. Tina Ingram.  Patient states she was trying to open a sliding door and fell lost her balance fell backwards.  Patient does endorse some urgency and frequency but no dysuria.  Patient is unsure about odor but per nursing staff note review from ED provider nursing staff was concerned for darker concentrated strong odor urine.  Hospital course from Dr. Jimmye Ingram  9/7-9/11/22: Pt has had multiple falls likely secondary to deconditioning. PT/OT evaluated the pt and recommended SNF. Of note, pt was treated for UTI and completed the course of abx while inpatient.   Discharge Diagnoses:  Principal Problem:   UTI (urinary tract infection) Active Problems:   Hypokalemia   Essential hypertension   Skin cancer   Fall at home, initial encounter   Generalized weakness   GERD without esophagitis    UTI: completed abx course. Urine cx showed insignificant course.    Multiple falls: likely secondary to deconditioning & UTI. PT/OT recs SNF.    Hypokalemia:WNL    Leukocytosis: labile. Likely reactive    HTN: continue on home dose of CCB and restart home dose HCTZ   GERD: continue on PPI    Skin lesion/cancer: scab in place on scalp.  Continue w/ wound care   Thrombocytosis: etiology unclear, likely reactive. Will continue to monitor   Discharge Instructions  Discharge Instructions     Diet - low sodium heart healthy   Complete by: As directed    Diet general   Complete by: As directed    Discharge instructions   Complete by: As directed    F/u w/ PCP in 1-2 weeks   Discharge wound care:   Complete by: As directed    Apply bacitracin ointment to the head lesion twice daily.  Call Dr. Guadlupe Ingram or her PA-C Tina Ingram, if any questions.   Increase activity slowly   Complete by: As directed       Allergies as  of 09/23/2020   No Known Allergies      Medication List     TAKE these medications    acetaminophen 325 MG tablet Commonly known as: TYLENOL Take 2 tablets (650 mg total) by mouth every 6 (six) hours as needed for mild pain (or Fever >/= 101).   amLODipine 2.5 MG tablet Commonly known as: NORVASC Take 2.5 mg by mouth daily.   bacitracin ointment Apply topically 2 (two) times daily.   buPROPion 300 MG 24 hr tablet Commonly known as: WELLBUTRIN XL Take 300 mg by mouth at bedtime.   cetirizine 10 MG tablet Commonly  known as: ZYRTEC Take 10 mg by mouth daily.   diclofenac Sodium 1 % Gel Commonly known as: VOLTAREN Apply 1 application topically 4 (four) times daily as needed (pain).   hydrochlorothiazide 12.5 MG tablet Commonly known as: HYDRODIURIL Take 12.5 mg by mouth daily.   melatonin 3 MG Tabs tablet Take 3 mg by mouth at bedtime.   mupirocin ointment 2 % Commonly known as: BACTROBAN   pantoprazole 40 MG tablet Commonly known as: PROTONIX Take 40 mg by mouth daily.   traZODone 50 MG tablet Commonly known as: DESYREL Take 50 mg by mouth at bedtime.   Vitamin D 50 MCG (2000 UT) tablet Take 2,000 Units by mouth daily.               Discharge Care Instructions  (From admission, onward)           Start     Ordered   09/23/20 0000  Discharge wound care:       Comments: Apply bacitracin ointment to the head lesion twice daily.  Call Dr. Guadlupe Ingram or her PA-C Tina Ingram, if any questions.   09/23/20 1054            Contact information for after-discharge care     Destination     [facility] Preferred SNF .   Service: Skilled Nursing Contact information: [place] [phone]                    No Known Allergies  Consultations:    Procedures/Studies: CT HEAD WO CONTRAST (5MM)  Result Date: 09/18/2020 CLINICAL DATA:  CT Head 01/15/2019. EXAM: CT HEAD WITHOUT CONTRAST TECHNIQUE: Contiguous axial images were obtained from the base of the skull through the vertex without intravenous contrast. COMPARISON:  None. FINDINGS: Brain: New large area of hypoattenuation and loss of gray-white differentiation in the posterior right frontal and parietal lobes, compatible with interval infarct. There is evidence of volume loss with mild ex vacuo ventricular dilation of the right posterolateral ventricle and temporal horn. No evidence of acute hemorrhage. No sizable extra-axial fluid collection.  Postoperative changes of prior right frontal craniotomy. No hydrocephalus, midline shift, or mass lesion. Vascular: No hyperdense vessel identified. Calcific intracranial atherosclerosis. Skull: Postoperative changes of prior right frontal craniotomy. Sinuses/Orbits: Probable retention cysts in the right frontal sinus and left sphenoid sinus. Otherwise, sinuses are largely clear. No acute orbital findings. Other: Moderate right mastoid effusion. IMPRESSION: 1. 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 If there is concern for recent infarct, recommend MRI. 2. Otherwise, no evidence of acute intracranial abnormality. Electronically Signed   By: Margaretha Sheffield TinaD.   On: 09/18/2020 10:15   CT Cervical Spine Wo Contrast  Result Date: 09/18/2020 CLINICAL DATA:  Neck trauma (Age >=  65y) EXAM: CT CERVICAL SPINE WITHOUT CONTRAST TECHNIQUE: Multidetector CT imaging of the cervical spine was performed without intravenous contrast. Multiplanar CT image reconstructions were also generated. COMPARISON:  None.  CT cervical spine 12/30/2018. FINDINGS: Alignment: Similar alignment. Similar straightening of the normal cervical lordosis and mild anterolisthesis of C4 on C5. Skull base and vertebrae: No evidence of acute fracture. Vertebral body heights are similar to the prior. Soft tissues and spinal canal: No prevertebral fluid or swelling. No visible canal hematoma. Disc levels: Similar multilevel degenerative disc disease, greatest at C5-C6 and C6-C7 where there is disc height loss and endplate spurring. Upper chest: Visualized lung apices are clear. Other: Calcific atherosclerosis of the carotids. IMPRESSION: 1. No evidence of acute fracture or traumatic malalignment. 2. Similar multilevel degenerative disc disease. Electronically Signed   By: Margaretha Sheffield TinaD.   On: 09/18/2020 10:20   CT LUMBAR SPINE WO CONTRAST  Result  Date: 09/18/2020 CLINICAL DATA:  Pelvic trauma EXAM: CT LUMBAR SPINE WITHOUT CONTRAST TECHNIQUE: Multidetector CT imaging of the lumbar spine was performed without intravenous contrast administration. Multiplanar CT image reconstructions were also generated. COMPARISON:  Lumbar radiographs 12/30/2018. FINDINGS: Segmentation: 5 non rib-bearing lumbar vertebral bodies. Alignment: S shaped lumbar curvature (Dextrocurvature of the upper lumbar spine with levocurvature of the lower lumbar spine). No substantial sagittal subluxation. Vertebrae: No evidence of acute fracture. Chronic angulated fracture of the S3 vertebral body, partially imaged. Paraspinal and other soft tissues: Calcific atherosclerosis of the aorta and branch vessels. Poorly evaluated hypodense right renal lesion, possibly a cyst. Disc levels: Multilevel facet arthropathy/hypertrophy, greatest in the lower lumbar spine. Posterior disc bulges at multiple levels. IMPRESSION: 1. No evidence of acute fracture or traumatic malalignment. 2. Chronic angulated fracture of the S3 vertebral body, partially imaged. 3. S-shaped lumbar curvature with multilevel facet arthropathy/hypertrophy. MRI could further evaluate the canal/foramina if clinically indicated. 4. Osteopenia. Electronically Signed   By: Margaretha Sheffield TinaD.   On: 09/18/2020 10:29   CT PELVIS WO CONTRAST  Result Date: 09/18/2020 CLINICAL DATA:  Pelvic trauma, fall, left hip pain EXAM: CT PELVIS WITHOUT CONTRAST TECHNIQUE: Multidetector CT imaging of the pelvis was performed following the standard protocol without intravenous contrast. COMPARISON:  06/12/2011 FINDINGS: Urinary Tract:  No abnormality visualized. Bowel: Unremarkable visualized pelvic bowel loops. Status post appendectomy. Vascular/Lymphatic: No pathologically enlarged lymph nodes. Aortic atherosclerosis Reproductive:  No mass or other significant abnormality Other:  None. Musculoskeletal: Osteopenia. Chronic fracture deformities of  the left inferior pubic ramus (series 3, image 42). Nonacute, angulated fracture of the S3 segment (series 6, image 72). IMPRESSION: 1. Osteopenia. 2. Chronic fracture deformities of the left inferior pubic ramus. 3. Chronic, angulated fracture of the S3 segment. 4. No acute displaced fracture. Aortic Atherosclerosis (ICD10-I70.0). Electronically Signed   By: Eddie Candle TinaD.   On: 09/18/2020 10:12   (Echo, Carotid, EGD, Colonoscopy, ERCP)    Subjective: Pt c/o intermittent pain    Discharge Exam: Vitals:   09/23/20 0748 09/23/20 1120  BP: (!) 194/113 140/78  Pulse: 99 98  Resp: 18 17  Temp: 98.3 F (36.8 C) 98 F (36.7 C)  SpO2: 97% 97%   Vitals:   09/23/20 0500 09/23/20 0530 09/23/20 0748 09/23/20 1120  BP:  (!) 194/104 (!) 194/113 140/78  Pulse:  (!) 104 99 98  Resp:  '16 18 17  '$ Temp:  97.7 F (36.5 C) 98.3 F (36.8 C) 98 F (36.7 C)  TempSrc:      SpO2:  95% 97% 97%  Weight: 47.6 kg     Height:        General: Pt is alert, awake, not in acute distress. Frail appearing  Cardiovascular: S1/S2 +, no rubs, no gallops Respiratory: CTA bilaterally, no wheezing, no rhonchi Abdominal: Soft, NT, ND, bowel sounds + Extremities: no edema, no cyanosis    The results of significant diagnostics from this hospitalization (including imaging, microbiology, ancillary and laboratory) are listed below for reference.     Microbiology: Recent Results (from the past 240 hour(s))  Resp Panel by RT-PCR (Flu A&B, Covid) Nasopharyngeal Swab     Status: None   Collection Time: 09/18/20  2:17 PM   Specimen: Nasopharyngeal Swab; Nasopharyngeal(NP) swabs in vial transport medium  Result Value Ref Range Status   SARS Coronavirus 2 by RT PCR NEGATIVE NEGATIVE Final    Comment: (NOTE) SARS-CoV-2 target nucleic acids are NOT DETECTED.  The SARS-CoV-2 RNA is generally detectable in upper respiratory specimens during the acute phase of infection. The lowest concentration of SARS-CoV-2 viral  copies this assay can detect is 138 copies/mL. A negative result does not preclude SARS-Cov-2 infection and should not be used as the sole basis for treatment or other patient management decisions. A negative result may occur with  improper specimen collection/handling, submission of specimen other than nasopharyngeal swab, presence of viral mutation(s) within the areas targeted by this assay, and inadequate number of viral copies(<138 copies/mL). A negative result must be combined with clinical observations, patient history, and epidemiological information. The expected result is Negative.  Fact Sheet for Patients:  EntrepreneurPulse.com.au  Fact Sheet for Healthcare Providers:  IncredibleEmployment.be  This test is no t yet approved or cleared by the [place] FDA and  has been authorized for detection and/or diagnosis of SARS-CoV-2 by FDA under an Emergency Use Authorization (EUA). This EUA will remain  in effect (meaning this test can be used) for the duration of the COVID-19 declaration under Section 564(b)(1) of the Act, 21 U.S.C.section 360bbb-3(b)(1), unless the authorization is terminated  or revoked sooner.       Influenza A by PCR NEGATIVE NEGATIVE Final   Influenza B by PCR NEGATIVE NEGATIVE Final    Comment: (NOTE) The Xpert Xpress SARS-CoV-2/FLU/RSV plus assay is intended as an aid in the diagnosis of influenza from Nasopharyngeal swab specimens and should not be used as a sole basis for treatment. Nasal washings and aspirates are unacceptable for Xpert Xpress SARS-CoV-2/FLU/RSV testing.  Fact Sheet for Patients: EntrepreneurPulse.com.au  Fact Sheet for Healthcare Providers: IncredibleEmployment.be  This test is not yet approved or cleared by the [place] FDA and has been authorized for detection and/or diagnosis of SARS-CoV-2 by FDA under an Emergency Use Authorization (EUA). This  EUA will remain in effect (meaning this test can be used) for the duration of the COVID-19 declaration under Section 564(b)(1) of the Act, 21 U.S.C. section 360bbb-3(b)(1), unless the authorization is terminated or revoked.  Performed at [facility], [address], [place], [place] [address]   Urine Culture     Status: Abnormal   Collection Time: 09/19/20  6:03 PM   Specimen: Urine, Clean Catch  Result Value Ref Range Status   Specimen Description   Final    URINE, CLEAN CATCH Performed at [facility], [address] [place], [place] [address]    Special Requests   Final    NONE Performed at [facility], [address] [place],  [address]    Culture (A)  Final    <  10,000 COLONIES/mL INSIGNIFICANT GROWTH Performed at [facility], [place] [address] [place],  [address]    Report Status 09/21/2020 FINAL  Final     Labs: BNP (last 3 results) No results for input(s): BNP in the last 8760 hours. Basic Metabolic Panel: Recent Labs  Lab 09/19/20 0414 09/20/20 0533 09/21/20 0616 09/22/20 0531 09/23/20 0453  NA 139 138 136 138 139  K 4.0 4.5 5.0 3.9 4.0  CL 107 106 106 107 105  CO2 '26 26 23 25 27  '$ GLUCOSE 61* 71 65* 77 97  BUN '11 10 11 14 13  '$ CREATININE 1.11* 0.98 1.24* 1.20* 1.23*  CALCIUM 8.7* 8.9 9.5 9.3 9.8   Liver Function Tests: Recent Labs  Lab 09/18/20 1040 09/19/20 0414  AST 21 15  ALT 10 8  ALKPHOS 81 60  BILITOT 1.4* 1.2  PROT 6.6 5.6*  ALBUMIN 3.7 3.0*   No results for input(s): LIPASE, AMYLASE in the last 168 hours. No results for input(s): AMMONIA in the last 168 hours. CBC: Recent Labs  Lab 09/18/20 1040 09/19/20 0414 09/20/20 0533 09/21/20 0616 09/22/20 0531 09/23/20 0453  WBC 16.4* 13.7* 12.1* 11.9* 10.6* 13.2*  NEUTROABS 14.5*  --   --   --   --   --   HGB 13.6 11.6* 12.1 11.8* 11.7* 14.0  HCT 43.9 36.2 37.9 38.3 39.2 45.7  MCV 75.2* 74.2* 75.0* 77.2* 76.7* 75.0*  PLT 759* 627*  678* 689* 766* 833*   Cardiac Enzymes: No results for input(s): CKTOTAL, CKMB, CKMBINDEX, TROPONINI in the last 168 hours. BNP: Invalid input(s): POCBNP CBG: No results for input(s): GLUCAP in the last 168 hours. D-Dimer No results for input(s): DDIMER in the last 72 hours. Hgb A1c No results for input(s): HGBA1C in the last 72 hours. Lipid Profile No results for input(s): CHOL, HDL, LDLCALC, TRIG, CHOLHDL, LDLDIRECT in the last 72 hours. Thyroid function studies No results for input(s): TSH, T4TOTAL, T3FREE, THYROIDAB in the last 72 hours.  Invalid input(s): FREET3 Anemia work up No results for input(s): VITAMINB12, FOLATE, FERRITIN, TIBC, IRON, RETICCTPCT in the last 72 hours. Urinalysis    Component Value Date/Time   COLORURINE STRAW (A) 09/18/2020 1040   APPEARANCEUR CLEAR 09/18/2020 1040   LABSPEC 1.020 09/18/2020 1040   PHURINE 8.5 (H) 09/18/2020 1040   GLUCOSEU NEGATIVE 09/18/2020 1040   HGBUR MODERATE (A) 09/18/2020 1040   BILIRUBINUR NEGATIVE 09/18/2020 1040   KETONESUR NEGATIVE 09/18/2020 1040   PROTEINUR NEGATIVE 09/18/2020 1040   NITRITE POSITIVE (A) 09/18/2020 1040   LEUKOCYTESUR TRACE (A) 09/18/2020 1040   Sepsis Labs Invalid input(s): PROCALCITONIN,  WBC,  LACTICIDVEN Microbiology Recent Results (from the past 240 hour(s))  Resp Panel by RT-PCR (Flu A&B, Covid) Nasopharyngeal Swab     Status: None   Collection Time: 09/18/20  2:17 PM   Specimen: Nasopharyngeal Swab; Nasopharyngeal(NP) swabs in vial transport medium  Result Value Ref Range Status   SARS Coronavirus 2 by RT PCR NEGATIVE NEGATIVE Final    Comment: (NOTE) SARS-CoV-2 target nucleic acids are NOT DETECTED.  The SARS-CoV-2 RNA is generally detectable in upper respiratory specimens during the acute phase of infection. The lowest concentration of SARS-CoV-2 viral copies this assay can detect is 138 copies/mL. A negative result does not preclude SARS-Cov-2 infection and should not be used as  the sole basis for treatment or other patient management decisions. A negative result may occur with  improper specimen collection/handling, submission of specimen other than nasopharyngeal swab, presence of  viral mutation(s) within the areas targeted by this assay, and inadequate number of viral copies(<138 copies/mL). A negative result must be combined with clinical observations, patient history, and epidemiological information. The expected result is Negative.  Fact Sheet for Patients:  EntrepreneurPulse.com.au  Fact Sheet for Healthcare Providers:  IncredibleEmployment.be  This test is no t yet approved or cleared by the [place] FDA and  has been authorized for detection and/or diagnosis of SARS-CoV-2 by FDA under an Emergency Use Authorization (EUA). This EUA will remain  in effect (meaning this test can be used) for the duration of the COVID-19 declaration under Section 564(b)(1) of the Act, 21 U.S.C.section 360bbb-3(b)(1), unless the authorization is terminated  or revoked sooner.       Influenza A by PCR NEGATIVE NEGATIVE Final   Influenza B by PCR NEGATIVE NEGATIVE Final    Comment: (NOTE) The Xpert Xpress SARS-CoV-2/FLU/RSV plus assay is intended as an aid in the diagnosis of influenza from Nasopharyngeal swab specimens and should not be used as a sole basis for treatment. Nasal washings and aspirates are unacceptable for Xpert Xpress SARS-CoV-2/FLU/RSV testing.  Fact Sheet for Patients: EntrepreneurPulse.com.au  Fact Sheet for Healthcare Providers: IncredibleEmployment.be  This test is not yet approved or cleared by the [place] FDA and has been authorized for detection and/or diagnosis of SARS-CoV-2 by FDA under an Emergency Use Authorization (EUA). This EUA will remain in effect (meaning this test can be used) for the duration of the COVID-19 declaration under Section 564(b)(1) of  the Act, 21 U.S.C. section 360bbb-3(b)(1), unless the authorization is terminated or revoked.  Performed at [facility], [address], [place], [place] [address]   Urine Culture     Status: Abnormal   Collection Time: 09/19/20  6:03 PM   Specimen: Urine, Clean Catch  Result Value Ref Range Status   Specimen Description   Final    URINE, CLEAN CATCH Performed at [facility], [address] [place], [place] [address]    Special Requests   Final    NONE Performed at [facility], [address], [place], [place] [address]    Culture (A)  Final    <10,000 COLONIES/mL INSIGNIFICANT GROWTH Performed at [facility] [address], [place],  [address]    Report Status 09/21/2020 FINAL  Final     Time coordinating discharge: Over 30 minutes  SIGNED:   Wyvonnia Dusky, MD  [facility] 09/23/2020, 11:28 AM Pager   If 7PM-7AM, please contact night-coverage [email or web address]

## 2020-09-23 NOTE — TOC Transition Note (Signed)
Transition of Care [facility]) - CM/SW Discharge Note   Patient Details  Name: ROCHEL MOROYOQUI MRN: [id number] Date of Birth: [birth year]  Transition of Care [facility]) CM/SW Contact:  Kerin Salen, RN Phone Number: 09/23/2020, 11:55 AM   Clinical Narrative:  To discharge to [facility] room 801 via [facility]. Nurse to call report to [phone]. Called grandson Sydel Wilensky to inform him that patient will discharge to [facility], so voices understanding, says he received message from the Attending. TOC barriers resolved.     Final next level of care: Skilled Nursing Facility Barriers to Discharge: Barriers Resolved   Patient Goals and CMS Choice Patient states their goals for this hospitalization and ongoing recovery are:: Patient agrees to go to SNF for short term rehab CMS Medicare.gov Compare Post Acute Care list provided to:: Patient Choice offered to / list presented to : Patient  Discharge Placement              Patient chooses bed at: [facility]  Patient to be transferred to facility by: [facility] Name of family member notified: Attending called family left message Patient and family notified of of transfer: 09/23/20  Discharge Plan and Services   Discharge Planning Services: CM Consult Post Acute Care Choice: [place]          DME Arranged: N/A DME Agency: NA       HH Arranged: NA          Social Determinants of Health (SDOH) Interventions     Readmission Risk Interventions No flowsheet data found.

## 2020-09-24 ENCOUNTER — Telehealth: Payer: Self-pay | Admitting: *Deleted

## 2020-09-24 DIAGNOSIS — N390 Urinary tract infection, site not specified: Secondary | ICD-10-CM | POA: Diagnosis not present

## 2020-09-24 DIAGNOSIS — I10 Essential (primary) hypertension: Secondary | ICD-10-CM | POA: Diagnosis not present

## 2020-09-24 DIAGNOSIS — D75839 Thrombocytosis, unspecified: Secondary | ICD-10-CM | POA: Diagnosis not present

## 2020-09-24 DIAGNOSIS — E876 Hypokalemia: Secondary | ICD-10-CM | POA: Diagnosis not present

## 2020-09-24 DIAGNOSIS — D72829 Elevated white blood cell count, unspecified: Secondary | ICD-10-CM | POA: Diagnosis not present

## 2020-09-24 DIAGNOSIS — C4440 Unspecified malignant neoplasm of skin of scalp and neck: Secondary | ICD-10-CM | POA: Diagnosis not present

## 2020-09-24 DIAGNOSIS — M6281 Muscle weakness (generalized): Secondary | ICD-10-CM | POA: Diagnosis not present

## 2020-09-24 NOTE — Telephone Encounter (Signed)
Received Physician Orders on (09/13/20) via of fax from [facility].  Requesting a signature and return.  Given to provider to sign.    Orders signed and faxed back to [facility].  Confirmation received and copy scanned into the chart.//AB/CMA

## 2020-09-24 NOTE — Telephone Encounter (Signed)
Received Orders via of fax on (09/15/20) from [facility] and return.  Given to provider to sign.    Orders signed and faxed back to [facility].  Confirmation received and copy scanned into the chart.//AB/CMA

## 2020-09-28 DIAGNOSIS — D72829 Elevated white blood cell count, unspecified: Secondary | ICD-10-CM | POA: Diagnosis not present

## 2020-09-28 DIAGNOSIS — N390 Urinary tract infection, site not specified: Secondary | ICD-10-CM | POA: Diagnosis not present

## 2020-09-28 DIAGNOSIS — D75839 Thrombocytosis, unspecified: Secondary | ICD-10-CM | POA: Diagnosis not present

## 2020-09-28 DIAGNOSIS — N179 Acute kidney failure, unspecified: Secondary | ICD-10-CM | POA: Diagnosis not present

## 2020-09-28 DIAGNOSIS — I10 Essential (primary) hypertension: Secondary | ICD-10-CM | POA: Diagnosis not present

## 2020-09-28 DIAGNOSIS — R000 Tachycardia, unspecified: Secondary | ICD-10-CM | POA: Diagnosis not present

## 2020-09-28 DIAGNOSIS — E876 Hypokalemia: Secondary | ICD-10-CM | POA: Diagnosis not present

## 2020-10-02 DIAGNOSIS — W1789XA Other fall from one level to another, initial encounter: Secondary | ICD-10-CM | POA: Diagnosis not present

## 2020-10-02 DIAGNOSIS — M6281 Muscle weakness (generalized): Secondary | ICD-10-CM | POA: Diagnosis not present

## 2020-10-02 DIAGNOSIS — N179 Acute kidney failure, unspecified: Secondary | ICD-10-CM | POA: Diagnosis not present

## 2020-10-04 ENCOUNTER — Ambulatory Visit: Payer: PPO | Admitting: Dermatology

## 2020-10-05 DIAGNOSIS — N390 Urinary tract infection, site not specified: Secondary | ICD-10-CM | POA: Diagnosis not present

## 2020-10-05 DIAGNOSIS — M6281 Muscle weakness (generalized): Secondary | ICD-10-CM | POA: Diagnosis not present

## 2020-10-05 DIAGNOSIS — N179 Acute kidney failure, unspecified: Secondary | ICD-10-CM | POA: Diagnosis not present

## 2020-10-10 DIAGNOSIS — I10 Essential (primary) hypertension: Secondary | ICD-10-CM | POA: Diagnosis not present

## 2020-10-10 DIAGNOSIS — K5900 Constipation, unspecified: Secondary | ICD-10-CM | POA: Diagnosis not present

## 2020-10-10 DIAGNOSIS — N390 Urinary tract infection, site not specified: Secondary | ICD-10-CM | POA: Diagnosis not present

## 2020-10-10 DIAGNOSIS — M6281 Muscle weakness (generalized): Secondary | ICD-10-CM | POA: Diagnosis not present

## 2020-10-12 DIAGNOSIS — C4440 Unspecified malignant neoplasm of skin of scalp and neck: Secondary | ICD-10-CM | POA: Diagnosis not present

## 2020-10-12 DIAGNOSIS — I10 Essential (primary) hypertension: Secondary | ICD-10-CM | POA: Diagnosis not present

## 2020-10-12 DIAGNOSIS — M6281 Muscle weakness (generalized): Secondary | ICD-10-CM | POA: Diagnosis not present

## 2020-10-16 DIAGNOSIS — N179 Acute kidney failure, unspecified: Secondary | ICD-10-CM | POA: Diagnosis not present

## 2020-10-16 DIAGNOSIS — N390 Urinary tract infection, site not specified: Secondary | ICD-10-CM | POA: Diagnosis not present

## 2020-10-16 DIAGNOSIS — D75839 Thrombocytosis, unspecified: Secondary | ICD-10-CM | POA: Diagnosis not present

## 2020-10-16 DIAGNOSIS — I10 Essential (primary) hypertension: Secondary | ICD-10-CM | POA: Diagnosis not present

## 2020-10-16 DIAGNOSIS — D72829 Elevated white blood cell count, unspecified: Secondary | ICD-10-CM | POA: Diagnosis not present

## 2020-10-16 DIAGNOSIS — M6281 Muscle weakness (generalized): Secondary | ICD-10-CM | POA: Diagnosis not present

## 2020-10-16 DIAGNOSIS — R000 Tachycardia, unspecified: Secondary | ICD-10-CM | POA: Diagnosis not present

## 2020-10-22 DIAGNOSIS — D72829 Elevated white blood cell count, unspecified: Secondary | ICD-10-CM | POA: Diagnosis not present

## 2020-10-22 DIAGNOSIS — M6281 Muscle weakness (generalized): Secondary | ICD-10-CM | POA: Diagnosis not present

## 2020-10-22 DIAGNOSIS — I10 Essential (primary) hypertension: Secondary | ICD-10-CM | POA: Diagnosis not present

## 2020-10-22 DIAGNOSIS — N390 Urinary tract infection, site not specified: Secondary | ICD-10-CM | POA: Diagnosis not present

## 2020-10-22 DIAGNOSIS — N179 Acute kidney failure, unspecified: Secondary | ICD-10-CM | POA: Diagnosis not present

## 2020-10-22 DIAGNOSIS — C4440 Unspecified malignant neoplasm of skin of scalp and neck: Secondary | ICD-10-CM | POA: Diagnosis not present

## 2020-10-23 DIAGNOSIS — D72829 Elevated white blood cell count, unspecified: Secondary | ICD-10-CM | POA: Diagnosis not present

## 2020-10-24 DIAGNOSIS — N390 Urinary tract infection, site not specified: Secondary | ICD-10-CM | POA: Diagnosis not present

## 2020-10-24 DIAGNOSIS — C4440 Unspecified malignant neoplasm of skin of scalp and neck: Secondary | ICD-10-CM | POA: Diagnosis not present

## 2020-10-24 DIAGNOSIS — N179 Acute kidney failure, unspecified: Secondary | ICD-10-CM | POA: Diagnosis not present

## 2020-10-24 DIAGNOSIS — M6281 Muscle weakness (generalized): Secondary | ICD-10-CM | POA: Diagnosis not present

## 2020-10-24 DIAGNOSIS — B9620 Unspecified Escherichia coli [E. coli] as the cause of diseases classified elsewhere: Secondary | ICD-10-CM | POA: Diagnosis not present

## 2020-10-24 DIAGNOSIS — I10 Essential (primary) hypertension: Secondary | ICD-10-CM | POA: Diagnosis not present

## 2020-10-25 DIAGNOSIS — I6529 Occlusion and stenosis of unspecified carotid artery: Secondary | ICD-10-CM | POA: Diagnosis not present

## 2020-10-25 DIAGNOSIS — Z9181 History of falling: Secondary | ICD-10-CM | POA: Diagnosis not present

## 2020-10-25 DIAGNOSIS — M5030 Other cervical disc degeneration, unspecified cervical region: Secondary | ICD-10-CM | POA: Diagnosis not present

## 2020-10-25 DIAGNOSIS — D509 Iron deficiency anemia, unspecified: Secondary | ICD-10-CM | POA: Diagnosis not present

## 2020-10-25 DIAGNOSIS — Z9841 Cataract extraction status, right eye: Secondary | ICD-10-CM | POA: Diagnosis not present

## 2020-10-25 DIAGNOSIS — Z87440 Personal history of urinary (tract) infections: Secondary | ICD-10-CM | POA: Diagnosis not present

## 2020-10-25 DIAGNOSIS — M810 Age-related osteoporosis without current pathological fracture: Secondary | ICD-10-CM | POA: Diagnosis not present

## 2020-10-25 DIAGNOSIS — K219 Gastro-esophageal reflux disease without esophagitis: Secondary | ICD-10-CM | POA: Diagnosis not present

## 2020-10-25 DIAGNOSIS — I1310 Hypertensive heart and chronic kidney disease without heart failure, with stage 1 through stage 4 chronic kidney disease, or unspecified chronic kidney disease: Secondary | ICD-10-CM | POA: Diagnosis not present

## 2020-10-25 DIAGNOSIS — Z86010 Personal history of colonic polyps: Secondary | ICD-10-CM | POA: Diagnosis not present

## 2020-10-25 DIAGNOSIS — G4700 Insomnia, unspecified: Secondary | ICD-10-CM | POA: Diagnosis not present

## 2020-10-25 DIAGNOSIS — Z8673 Personal history of transient ischemic attack (TIA), and cerebral infarction without residual deficits: Secondary | ICD-10-CM | POA: Diagnosis not present

## 2020-10-25 DIAGNOSIS — I6523 Occlusion and stenosis of bilateral carotid arteries: Secondary | ICD-10-CM | POA: Diagnosis not present

## 2020-10-25 DIAGNOSIS — Z9049 Acquired absence of other specified parts of digestive tract: Secondary | ICD-10-CM | POA: Diagnosis not present

## 2020-10-25 DIAGNOSIS — C4440 Unspecified malignant neoplasm of skin of scalp and neck: Secondary | ICD-10-CM | POA: Diagnosis not present

## 2020-10-25 DIAGNOSIS — Z79899 Other long term (current) drug therapy: Secondary | ICD-10-CM | POA: Diagnosis not present

## 2020-10-25 DIAGNOSIS — N1831 Chronic kidney disease, stage 3a: Secondary | ICD-10-CM | POA: Diagnosis not present

## 2020-10-25 DIAGNOSIS — Z961 Presence of intraocular lens: Secondary | ICD-10-CM | POA: Diagnosis not present

## 2020-10-25 DIAGNOSIS — Z9889 Other specified postprocedural states: Secondary | ICD-10-CM | POA: Diagnosis not present

## 2020-10-25 DIAGNOSIS — R269 Unspecified abnormalities of gait and mobility: Secondary | ICD-10-CM | POA: Diagnosis not present

## 2020-10-25 DIAGNOSIS — I10 Essential (primary) hypertension: Secondary | ICD-10-CM | POA: Diagnosis not present

## 2020-10-25 DIAGNOSIS — F17210 Nicotine dependence, cigarettes, uncomplicated: Secondary | ICD-10-CM | POA: Diagnosis not present

## 2020-10-25 DIAGNOSIS — F329 Major depressive disorder, single episode, unspecified: Secondary | ICD-10-CM | POA: Diagnosis not present

## 2020-10-25 DIAGNOSIS — M1990 Unspecified osteoarthritis, unspecified site: Secondary | ICD-10-CM | POA: Diagnosis not present

## 2020-10-25 DIAGNOSIS — Z86008 Personal history of in-situ neoplasm of other site: Secondary | ICD-10-CM | POA: Diagnosis not present

## 2020-10-25 DIAGNOSIS — G8929 Other chronic pain: Secondary | ICD-10-CM | POA: Diagnosis not present

## 2020-10-26 DIAGNOSIS — M6281 Muscle weakness (generalized): Secondary | ICD-10-CM | POA: Diagnosis not present

## 2020-10-29 DIAGNOSIS — I10 Essential (primary) hypertension: Secondary | ICD-10-CM | POA: Diagnosis not present

## 2020-11-09 DIAGNOSIS — M6281 Muscle weakness (generalized): Secondary | ICD-10-CM | POA: Diagnosis not present

## 2020-11-09 DIAGNOSIS — I10 Essential (primary) hypertension: Secondary | ICD-10-CM | POA: Diagnosis not present

## 2020-11-13 DIAGNOSIS — I10 Essential (primary) hypertension: Secondary | ICD-10-CM | POA: Diagnosis not present

## 2020-11-13 DIAGNOSIS — F339 Major depressive disorder, recurrent, unspecified: Secondary | ICD-10-CM | POA: Diagnosis not present

## 2020-11-13 DIAGNOSIS — K219 Gastro-esophageal reflux disease without esophagitis: Secondary | ICD-10-CM | POA: Diagnosis not present

## 2020-11-13 DIAGNOSIS — D72829 Elevated white blood cell count, unspecified: Secondary | ICD-10-CM | POA: Diagnosis not present

## 2020-11-13 DIAGNOSIS — R269 Unspecified abnormalities of gait and mobility: Secondary | ICD-10-CM | POA: Diagnosis not present

## 2020-11-13 DIAGNOSIS — D473 Essential (hemorrhagic) thrombocythemia: Secondary | ICD-10-CM | POA: Diagnosis not present

## 2020-11-13 DIAGNOSIS — G4700 Insomnia, unspecified: Secondary | ICD-10-CM | POA: Diagnosis not present

## 2020-11-20 ENCOUNTER — Emergency Department
Admission: EM | Admit: 2020-11-20 | Discharge: 2020-11-20 | Disposition: A | Discharge status: Skilled Nursing Facility | Payer: PPO | Attending: Emergency Medicine | Admitting: Emergency Medicine

## 2020-11-20 ENCOUNTER — Encounter: Payer: Self-pay | Admitting: Emergency Medicine

## 2020-11-20 ENCOUNTER — Emergency Department: Payer: PPO

## 2020-11-20 DIAGNOSIS — S0990XA Unspecified injury of head, initial encounter: Secondary | ICD-10-CM

## 2020-11-20 DIAGNOSIS — S0001XA Abrasion of scalp, initial encounter: Secondary | ICD-10-CM | POA: Insufficient documentation

## 2020-11-20 DIAGNOSIS — Z043 Encounter for examination and observation following other accident: Secondary | ICD-10-CM | POA: Diagnosis not present

## 2020-11-20 DIAGNOSIS — Z79899 Other long term (current) drug therapy: Secondary | ICD-10-CM | POA: Diagnosis not present

## 2020-11-20 DIAGNOSIS — Z85828 Personal history of other malignant neoplasm of skin: Secondary | ICD-10-CM | POA: Insufficient documentation

## 2020-11-20 DIAGNOSIS — F17210 Nicotine dependence, cigarettes, uncomplicated: Secondary | ICD-10-CM | POA: Insufficient documentation

## 2020-11-20 DIAGNOSIS — I10 Essential (primary) hypertension: Secondary | ICD-10-CM | POA: Insufficient documentation

## 2020-11-20 DIAGNOSIS — W01198A Fall on same level from slipping, tripping and stumbling with subsequent striking against other object, initial encounter: Secondary | ICD-10-CM | POA: Diagnosis not present

## 2020-11-20 DIAGNOSIS — R0902 Hypoxemia: Secondary | ICD-10-CM | POA: Diagnosis not present

## 2020-11-20 DIAGNOSIS — R58 Hemorrhage, not elsewhere classified: Secondary | ICD-10-CM | POA: Diagnosis not present

## 2020-11-20 NOTE — ED Notes (Signed)
Pt back from CT

## 2020-11-20 NOTE — ED Notes (Signed)
Patient is sleeping, chest rise and fall observed. Awaiting transport back to assisted living facility. No acute distress noted.

## 2020-11-20 NOTE — ED Provider Notes (Signed)
[facility] Emergency Department Provider Note   ____________________________________________   Event Date/Time   First MD Initiated Contact with Patient 11/20/20 0236     (approximate)  I have reviewed the triage vital signs and the nursing notes.   HISTORY  Chief Complaint Fall    HPI Tina Ingram is a 86 y.o. female brought to the ED via EMS from [facility] status post unwitnessed fall out of bed and striking posterior head on wooden bedside table with scalp lacerations.  EMS reports night shift staff at the facility not familiar with patient and noted patient with slurred speech.  Patient denies all complaints.  Denies headache, vision changes, neck pain, chest pain, shortness of breath, abdominal pain, nausea, vomiting or dizziness.  Denies anticoagulant use.      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   left vertex scalp - [facility] nasal tip    Patient Active Problem List   Diagnosis Date Noted   UTI (urinary tract infection) 09/18/2020   Fall at home, initial encounter 09/18/2020   Generalized weakness 09/18/2020   GERD without esophagitis 09/18/2020   Skin cancer 09/10/2020   Symptomatic anemia    Iron deficiency anemia due to chronic blood loss    Hypokalemia    AKI (acute kidney injury) ([place])    Essential hypertension    Tobacco dependence    GI bleed 07/05/2020    Past Surgical History:  Procedure Laterality Date   APPENDECTOMY     BREAST BIOPSY Left 05/16/2014   complex sclerosing lesion    BREAST EXCISIONAL BIOPSY Left 2016   surgical exc to remove complex sclerosing lesion   BREAST LUMPECTOMY WITH RADIOACTIVE SEED LOCALIZATION Left 06/20/2014    Procedure: BREAST LUMPECTOMY WITH RADIOACTIVE SEED LOCALIZATION;  Surgeon: Erroll Luna, MD;  Location: [place];  Service: General;  Laterality: Left;   CAROTID ENDARTERECTOMY Right    CATARACT EXTRACTION W/PHACO Right 06/25/2015   Procedure: CATARACT EXTRACTION PHACO AND INTRAOCULAR LENS PLACEMENT ([place]);  Surgeon: Estill Cotta, MD;  Location: [facility];  Service: Ophthalmology;  Laterality: Right;  [place] 2.01 AP% 24.5 CDE 52.16 Fluid pack lot # [id number]   COLONOSCOPY     COLONOSCOPY WITH PROPOFOL N/A 07/07/2020   Procedure: COLONOSCOPY WITH PROPOFOL;  Surgeon: Lesly Rubenstein, MD;  Location: [facility] ENDOSCOPY;  Service: Endoscopy;  Laterality: N/A;   ESOPHAGOGASTRODUODENOSCOPY N/A 07/10/2020   Procedure: ESOPHAGOGASTRODUODENOSCOPY (EGD);  Surgeon: Jonathon Bellows, MD;  Location: [facility] ENDOSCOPY;  Service: Gastroenterology;  Laterality: N/A;   ESOPHAGOGASTRODUODENOSCOPY (EGD) WITH PROPOFOL N/A 07/06/2020   Procedure: ESOPHAGOGASTRODUODENOSCOPY (EGD) WITH PROPOFOL;  Surgeon: [name], MD;  Location: [facility]COPY;  Service: Endoscopy;  Laterality: N/A;   EYE SURGERY     KYPHOPLASTY N/A 01/27/2017   Procedure: [id number];  Surgeon: Hessie Knows, MD;  Location: [facility];  Service: Orthopedics;  Laterality: N/A;  T-9     Prior to Admission medications   Medication Sig Start Date End Date Taking? Authorizing Provider  acetaminophen (TYLENOL) 325 MG tablet Take 2 tablets (650 mg total) by mouth every 6 (six) hours as needed for mild pain (or Fever >/= 101). 09/23/20   Eppie Gibson  M, MD  amLODipine (NORVASC) 2.5 MG tablet Take 2.5 mg by mouth daily.    [provider]  bacitracin ointment Apply topically 2 (two) times daily. 09/23/20   Wyvonnia Dusky, MD  buPROPion (WELLBUTRIN XL) 300 MG 24 hr tablet Take 300 mg by mouth at bedtime. 07/18/20   [provider]  cetirizine (ZYRTEC) 10 MG tablet Take 10 mg by mouth daily.    [provider]   Cholecalciferol (VITAMIN D) 50 MCG (2000 UT) tablet Take 2,000 Units by mouth daily.    [provider]  diclofenac Sodium (VOLTAREN) 1 % GEL Apply 1 application topically 4 (four) times daily as needed (pain).    [provider]  hydrochlorothiazide (HYDRODIURIL) 12.5 MG tablet Take 12.5 mg by mouth daily.    [provider]  melatonin 3 MG TABS tablet Take 3 mg by mouth at bedtime.    [provider]  mupirocin ointment (BACTROBAN) 2 %  06/21/20   [provider]  pantoprazole (PROTONIX) 40 MG tablet Take 40 mg by mouth daily.    [provider]  traZODone (DESYREL) 50 MG tablet Take 50 mg by mouth at bedtime. 12/25/18   [provider]    Allergies Patient has no known allergies.  Family History  Problem Relation Age of Onset   Breast cancer Neg Hx     Social History Social History   Tobacco Use   Smoking status: Every Day    Packs/day: 1.00    Types: Cigarettes   Smokeless tobacco: Never  Vaping Use   Vaping Use: Never used  Substance Use Topics   Alcohol use: No   Drug use: Never    Review of Systems  Constitutional: No fever/chills Eyes: No visual changes. ENT: No sore throat. Cardiovascular: Denies chest pain. Respiratory: Denies shortness of breath. Gastrointestinal: No abdominal pain.  No nausea, no vomiting.  No diarrhea.  No constipation. Genitourinary: Negative for dysuria. Musculoskeletal: Negative for back pain. Skin: Negative for rash. Neurological: Positive for head injury.  Negative for headaches, focal weakness or numbness.   ____________________________________________   PHYSICAL EXAM:  VITAL SIGNS: ED Triage Vitals  Enc Vitals Group     BP      Pulse      Resp      Temp      Temp src      SpO2      Weight      Height      Head Circumference      Peak Flow      Pain Score      Pain Loc      Pain Edu?      Excl. in [place]?     Constitutional: Alert and oriented.  Elderly  appearing and in no acute distress. Eyes: Conjunctivae are normal. PERRL. EOMI. Head: Bloody posterior scalp which will be reexamined after cleansing. Nose: Atraumatic. Mouth/Throat: Mucous membranes are moist.  No dental malocclusion. Neck: No stridor.  No cervical spine tenderness to palpation. Cardiovascular: Normal rate, regular rhythm. Grossly normal heart sounds.  Good peripheral circulation. Respiratory: Normal respiratory effort.  No retractions. Lungs CTAB. Gastrointestinal: Soft and nontender. No distention. No abdominal bruits. No CVA tenderness. Musculoskeletal: No spinal tenderness to palpation.  Pelvis is stable.  No lower extremity tenderness nor edema.  No joint effusions. Neurologic: Alert and oriented x4.  Normal speech and language.  No slurred speech, dysarthria or aphasia.  No gross focal neurologic deficits are appreciated. MAEx4.  Skin:  Skin is warm, dry and intact. No rash noted. Psychiatric: Mood and affect are normal. Speech and behavior are normal.  ____________________________________________   LABS (all labs ordered are listed, but only abnormal results are displayed)  Labs Reviewed - No data to display ____________________________________________  EKG  ED ECG REPORT I, Emmerson Taddei J, the attending physician, personally viewed and interpreted this ECG.   Date: 11/20/2020  EKG Time: 0247  Rate: 91  Rhythm: normal sinus rhythm  Axis: Normal  Intervals:none  ST&T Change: Nonspecific  ____________________________________________  RADIOLOGY I, Damareon Lanni J, personally viewed and evaluated these images (plain radiographs) as part of my medical decision making, as well as reviewing the written report by the radiologist.  ED MD interpretation: No ICH  Official radiology report(s): CT Head Wo Contrast  Result Date: 11/20/2020 CLINICAL DATA:  Unwitnessed fall EXAM: CT HEAD WITHOUT CONTRAST TECHNIQUE: Contiguous axial images were obtained from the base of  the skull through the vertex without intravenous contrast. COMPARISON:  09/18/2020 FINDINGS: Brain: No evidence of acute infarction, hemorrhage, hydrocephalus, extra-axial collection or mass lesion/mass effect. 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 Old left cerebellar lacunar infarct. Subcortical white matter and periventricular small vessel ischemic changes. Vascular: Intracranial atherosclerosis. Skull: Normal. Negative for fracture or focal lesion. Prior right frontal craniotomy. Sinuses/Orbits: The visualized paranasal sinuses are essentially clear. The mastoid air cells are unopacified. Other: None. IMPRESSION: No evidence of acute intracranial abnormality. 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 Small vessel ischemic changes. Electronically Signed   By: Julian Hy M.D.   On: 11/20/2020 03:21    ____________________________________________   PROCEDURES  Procedure(s) performed (including Critical Care):  Procedures   ____________________________________________   INITIAL IMPRESSION / ASSESSMENT AND PLAN / ED COURSE  As part of my medical decision making, I reviewed the following data within the [employer] notes reviewed and incorporated, Old chart reviewed, Radiograph reviewed, and Notes from prior ED visits     86 year old female presenting with head injury status post mechanical fall.  Differential diagnosis includes but is not limited to SDH, SAH, contusion, scalp laceration, etc.  Will obtain CT head.  Nursing to cleanse wounds; will reexamine.   ----------------------------------------- 4:35 AM on 11/20/2020 -----------------------------------------   Posterior head reexamined after cleansing; single abrasion to left posterior scalp noted which does not require staples.  No active bleeding.  Updated patient on CT results.  Will discharge back to her facility.  Strict return precautions given.  Patient verbalizes understanding  and agrees with plan of care.      ____________________________________________   FINAL CLINICAL IMPRESSION(S) / ED DIAGNOSES  Final diagnoses:  Injury of head, initial encounter  Abrasion of scalp, initial encounter     ED Discharge Orders     None        Note:  This document was prepared using Dragon voice recognition software and may include unintentional dictation errors.    Paulette Blanch, MD 11/20/20 [phone]

## 2020-11-20 NOTE — ED Notes (Signed)
Pt in CT.

## 2020-11-20 NOTE — Discharge Instructions (Signed)
Return to the ER for worsening symptoms, persistent vomiting, lethargy, difficulty breathing or other concerns.

## 2020-11-20 NOTE — ED Notes (Signed)
Blood cleansed from the back of pt's neck and head by Wille Glaser, EDT

## 2020-11-20 NOTE — ED Triage Notes (Signed)
Pt arrived via [facility] from [facility] after unwitnessed roll out of bed and striking posterior head on wooden bedside table, leaving 2 approx 1 inch lacerations. Per staff, pt with speech slur and slur un-normal. Pt alert and talking on arrival with alert and orientation x4.

## 2020-11-24 DIAGNOSIS — I6529 Occlusion and stenosis of unspecified carotid artery: Secondary | ICD-10-CM | POA: Diagnosis not present

## 2020-11-24 DIAGNOSIS — C4440 Unspecified malignant neoplasm of skin of scalp and neck: Secondary | ICD-10-CM | POA: Diagnosis not present

## 2020-11-24 DIAGNOSIS — Z86008 Personal history of in-situ neoplasm of other site: Secondary | ICD-10-CM | POA: Diagnosis not present

## 2020-11-24 DIAGNOSIS — M5030 Other cervical disc degeneration, unspecified cervical region: Secondary | ICD-10-CM | POA: Diagnosis not present

## 2020-11-24 DIAGNOSIS — Z8673 Personal history of transient ischemic attack (TIA), and cerebral infarction without residual deficits: Secondary | ICD-10-CM | POA: Diagnosis not present

## 2020-11-24 DIAGNOSIS — Z961 Presence of intraocular lens: Secondary | ICD-10-CM | POA: Diagnosis not present

## 2020-11-24 DIAGNOSIS — N1831 Chronic kidney disease, stage 3a: Secondary | ICD-10-CM | POA: Diagnosis not present

## 2020-11-24 DIAGNOSIS — I1310 Hypertensive heart and chronic kidney disease without heart failure, with stage 1 through stage 4 chronic kidney disease, or unspecified chronic kidney disease: Secondary | ICD-10-CM | POA: Diagnosis not present

## 2020-11-24 DIAGNOSIS — F329 Major depressive disorder, single episode, unspecified: Secondary | ICD-10-CM | POA: Diagnosis not present

## 2020-11-24 DIAGNOSIS — M810 Age-related osteoporosis without current pathological fracture: Secondary | ICD-10-CM | POA: Diagnosis not present

## 2020-11-24 DIAGNOSIS — F17210 Nicotine dependence, cigarettes, uncomplicated: Secondary | ICD-10-CM | POA: Diagnosis not present

## 2020-11-24 DIAGNOSIS — M1990 Unspecified osteoarthritis, unspecified site: Secondary | ICD-10-CM | POA: Diagnosis not present

## 2020-11-24 DIAGNOSIS — Z87440 Personal history of urinary (tract) infections: Secondary | ICD-10-CM | POA: Diagnosis not present

## 2020-11-24 DIAGNOSIS — K219 Gastro-esophageal reflux disease without esophagitis: Secondary | ICD-10-CM | POA: Diagnosis not present

## 2020-11-24 DIAGNOSIS — Z9049 Acquired absence of other specified parts of digestive tract: Secondary | ICD-10-CM | POA: Diagnosis not present

## 2020-11-24 DIAGNOSIS — D509 Iron deficiency anemia, unspecified: Secondary | ICD-10-CM | POA: Diagnosis not present

## 2020-11-24 DIAGNOSIS — G8929 Other chronic pain: Secondary | ICD-10-CM | POA: Diagnosis not present

## 2020-11-24 DIAGNOSIS — Z79899 Other long term (current) drug therapy: Secondary | ICD-10-CM | POA: Diagnosis not present

## 2020-11-24 DIAGNOSIS — Z9841 Cataract extraction status, right eye: Secondary | ICD-10-CM | POA: Diagnosis not present

## 2020-11-24 DIAGNOSIS — Z86010 Personal history of colonic polyps: Secondary | ICD-10-CM | POA: Diagnosis not present

## 2020-11-24 DIAGNOSIS — Z9181 History of falling: Secondary | ICD-10-CM | POA: Diagnosis not present

## 2020-11-26 ENCOUNTER — Emergency Department
Admission: EM | Admit: 2020-11-26 | Discharge: 2020-11-26 | Disposition: A | Discharge status: Home / Self Care | Payer: PPO | Attending: Emergency Medicine | Admitting: Emergency Medicine

## 2020-11-26 ENCOUNTER — Other Ambulatory Visit: Payer: Self-pay

## 2020-11-26 DIAGNOSIS — I10 Essential (primary) hypertension: Secondary | ICD-10-CM | POA: Diagnosis not present

## 2020-11-26 DIAGNOSIS — S91012A Laceration without foreign body, left ankle, initial encounter: Secondary | ICD-10-CM | POA: Diagnosis not present

## 2020-11-26 DIAGNOSIS — I1310 Hypertensive heart and chronic kidney disease without heart failure, with stage 1 through stage 4 chronic kidney disease, or unspecified chronic kidney disease: Secondary | ICD-10-CM | POA: Diagnosis not present

## 2020-11-26 DIAGNOSIS — Z23 Encounter for immunization: Secondary | ICD-10-CM | POA: Diagnosis not present

## 2020-11-26 DIAGNOSIS — D509 Iron deficiency anemia, unspecified: Secondary | ICD-10-CM | POA: Diagnosis not present

## 2020-11-26 DIAGNOSIS — Z85828 Personal history of other malignant neoplasm of skin: Secondary | ICD-10-CM | POA: Insufficient documentation

## 2020-11-26 DIAGNOSIS — M5030 Other cervical disc degeneration, unspecified cervical region: Secondary | ICD-10-CM | POA: Diagnosis not present

## 2020-11-26 DIAGNOSIS — S99912A Unspecified injury of left ankle, initial encounter: Secondary | ICD-10-CM | POA: Diagnosis present

## 2020-11-26 DIAGNOSIS — F17210 Nicotine dependence, cigarettes, uncomplicated: Secondary | ICD-10-CM | POA: Insufficient documentation

## 2020-11-26 DIAGNOSIS — M1990 Unspecified osteoarthritis, unspecified site: Secondary | ICD-10-CM | POA: Diagnosis not present

## 2020-11-26 DIAGNOSIS — C4440 Unspecified malignant neoplasm of skin of scalp and neck: Secondary | ICD-10-CM | POA: Diagnosis not present

## 2020-11-26 DIAGNOSIS — W228XXA Striking against or struck by other objects, initial encounter: Secondary | ICD-10-CM | POA: Diagnosis not present

## 2020-11-26 DIAGNOSIS — Z79899 Other long term (current) drug therapy: Secondary | ICD-10-CM | POA: Insufficient documentation

## 2020-11-26 DIAGNOSIS — R58 Hemorrhage, not elsewhere classified: Secondary | ICD-10-CM | POA: Diagnosis not present

## 2020-11-26 DIAGNOSIS — N1831 Chronic kidney disease, stage 3a: Secondary | ICD-10-CM | POA: Diagnosis not present

## 2020-11-26 DIAGNOSIS — G8929 Other chronic pain: Secondary | ICD-10-CM | POA: Diagnosis not present

## 2020-11-26 MED ORDER — LIDOCAINE-EPINEPHRINE 2 %-1:100000 IJ SOLN
20.0000 mL | Freq: Once | INTRAMUSCULAR | Status: AC
  Administered 2020-11-26: 20 mL via INTRADERMAL
  Filled 2020-11-26: qty 1

## 2020-11-26 MED ORDER — TETANUS-DIPHTH-ACELL PERTUSSIS 5-2.5-18.5 LF-MCG/0.5 IM SUSY
0.5000 mL | PREFILLED_SYRINGE | Freq: Once | INTRAMUSCULAR | Status: AC
  Administered 2020-11-26: 0.5 mL via INTRAMUSCULAR
  Filled 2020-11-26: qty 0.5

## 2020-11-26 NOTE — Discharge Instructions (Signed)
Patient has a small flap laceration to the left ankle.  Unfortunately flap was too fragile for suturing.  As such patient has an absorbable hemostatic dressing applied with Coban securing dressing.  Patient should have dressing remain in place for a week.  Top Coban layer may be changed however did not change underlying layers as this can dislodge the absorbable hemostatic agent.  Dressing should not get wet.  After a week, there should be a black scab-like area over this laceration.  Do not remove this.  Patient may have a large Band-Aid or gauze over this to ensure that pants legs do not loosen this area.  It should slough off on its own.  After a week the patient may get the area wet.

## 2020-11-26 NOTE — ED Provider Notes (Addendum)
[facility] Emergency Department Provider Note  ____________________________________________  Time seen: Approximately 7:04 PM  I have reviewed the triage vital signs and the nursing notes.   HISTORY  Chief Complaint Laceration (Left ankle)    HPI Tina Ingram is a 86 y.o. female who presents emergency department complaining of a laceration to the ankle.  Patient reportedly was trying to push self around in a wheelchair getting ready for dinner when she created the laceration.  Unsure of last tetanus shot.  No other injury or complaint.       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   left vertex scalp - MOHS 08/21/2020 ([facility]), nasal tip    Patient Active Problem List   Diagnosis Date Noted   UTI (urinary tract infection) 09/18/2020   Fall at home, initial encounter 09/18/2020   Generalized weakness 09/18/2020   GERD without esophagitis 09/18/2020   Skin cancer 09/10/2020   Symptomatic anemia    Iron deficiency anemia due to chronic blood loss    Hypokalemia    AKI (acute kidney injury) ([place])    Essential hypertension    Tobacco dependence    GI bleed 07/05/2020    Past Surgical History:  Procedure Laterality Date   APPENDECTOMY     BREAST BIOPSY Left 05/16/2014   complex sclerosing lesion    BREAST EXCISIONAL BIOPSY Left 2016   surgical exc to remove complex sclerosing lesion   BREAST LUMPECTOMY WITH RADIOACTIVE SEED LOCALIZATION Left 06/20/2014   Procedure: BREAST LUMPECTOMY WITH RADIOACTIVE SEED LOCALIZATION;  Surgeon: Erroll Luna, MD;  Location: [place];  Service: General;  Laterality: Left;   CAROTID ENDARTERECTOMY Right    CATARACT EXTRACTION W/PHACO Right  06/25/2015   Procedure: CATARACT EXTRACTION PHACO AND INTRAOCULAR LENS PLACEMENT ([place]);  Surgeon: Estill Cotta, MD;  Location: [facility];  Service: Ophthalmology;  Laterality: Right;  [place] 2.01 AP% 24.5 CDE 52.16 Fluid pack lot # [id number]   COLONOSCOPY     COLONOSCOPY WITH PROPOFOL N/A 07/07/2020   Procedure: COLONOSCOPY WITH PROPOFOL;  Surgeon: Lesly Rubenstein, MD;  Location: [facility];  Service: Endoscopy;  Laterality: N/A;   ESOPHAGOGASTRODUODENOSCOPY N/A 07/10/2020   Procedure: ESOPHAGOGASTRODUODENOSCOPY (EGD);  Surgeon: Jonathon Bellows, MD;  Location: [facility] ENDOSCOPY;  Service: Gastroenterology;  Laterality: N/A;   ESOPHAGOGASTRODUODENOSCOPY (EGD) WITH PROPOFOL N/A 07/06/2020   Procedure: ESOPHAGOGASTRODUODENOSCOPY (EGD) WITH PROPOFOL;  Surgeon: [name], MD;  Location: [facility] ENDOSCOPY;  Service: Endoscopy;  Laterality: N/A;   EYE SURGERY     KYPHOPLASTY N/A 01/27/2017   Procedure: [id number];  Surgeon: Hessie Knows, MD;  Location: [facility];  Service: Orthopedics;  Laterality: N/A;  T-9     Prior to Admission medications   Medication Sig Start Date End Date Taking? Authorizing Provider  acetaminophen (TYLENOL) 325 MG tablet Take 2 tablets (650 mg total) by mouth every 6 (six) hours as needed for mild pain (or Fever >/= 101). 09/23/20   Wyvonnia Dusky, MD  amLODipine (NORVASC) 2.5 MG tablet Take 2.5 mg by mouth daily.    [provider]  bacitracin ointment Apply topically 2 (two) times daily. 09/23/20   Wyvonnia Dusky, MD  buPROPion (WELLBUTRIN XL) 300  MG 24 hr tablet Take 300 mg by mouth at bedtime. 07/18/20   [provider]  cetirizine (ZYRTEC) 10 MG tablet Take 10 mg by mouth daily.    [provider]  Cholecalciferol (VITAMIN D) 50 MCG (2000 UT) tablet Take 2,000 Units by mouth daily.    [provider]  diclofenac Sodium (VOLTAREN) 1 % GEL Apply 1 application topically 4 (four) times daily as needed (pain).    [provider]  hydrochlorothiazide (HYDRODIURIL) 12.5 MG tablet Take 12.5 mg by mouth daily.    [provider]  melatonin 3 MG TABS tablet Take 3 mg by mouth at bedtime.    [provider]  mupirocin ointment (BACTROBAN) 2 %  06/21/20   [provider]  pantoprazole (PROTONIX) 40 MG tablet Take 40 mg by mouth daily.    [provider]  traZODone (DESYREL) 50 MG tablet Take 50 mg by mouth at bedtime. 12/25/18   [provider]    Allergies Patient has no known allergies.  Family History  Problem Relation Age of Onset   Breast cancer Neg Hx     Social History Social History   Tobacco Use   Smoking status: Every Day    Packs/day: 1.00    Types: Cigarettes   Smokeless tobacco: Never  Vaping Use   Vaping Use: Never used  Substance Use Topics   Alcohol use: No   Drug use: Never     Review of Systems  Constitutional: No fever/chills Eyes: No visual changes. No discharge ENT: No upper respiratory complaints. Cardiovascular: no chest pain. Respiratory: no cough. No SOB. Gastrointestinal: No abdominal pain.  No nausea, no vomiting.  No diarrhea.  No constipation. Musculoskeletal: Positive for laceration Skin: Negative for rash, abrasions, lacerations, ecchymosis. Neurological: Negative for headaches, focal weakness or numbness.  10 System ROS otherwise negative.  ____________________________________________   PHYSICAL EXAM:  VITAL SIGNS: ED Triage Vitals  Enc Vitals Group     BP      Pulse      Resp      Temp      Temp src      SpO2      Weight      Height      Head Circumference      Peak Flow      Pain Score      Pain Loc      Pain Edu?      Excl. in [place]?      Constitutional: Alert and oriented. Well appearing and in no acute distress. Eyes: Conjunctivae are normal. PERRL. EOMI. Head: Atraumatic. ENT:      Ears:       Nose: No congestion/rhinnorhea.      Mouth/Throat: Mucous membranes are moist.  Neck: No  stridor.    Cardiovascular: Normal rate, regular rhythm. Normal S1 and S2.  Good peripheral circulation. Respiratory: Normal respiratory effort without tachypnea or retractions. Lungs CTAB. Good air entry to the bases with no decreased or absent breath sounds. Musculoskeletal: Full range of motion to all extremities. No gross deformities appreciated.  Visualization of the left ankle reveals a small avulsion type laceration.  Bleeding is controlled with current pressure but removing of the dressing does cause slow venous bleeding.  This is easily controlled with direct pressure over the wound.  Tunneling the laceration measures approximately 1.5 cm in length.  Does create a small flap with extremely fragile tissue and a flap. Neurologic:  Normal speech and  language. No gross focal neurologic deficits are appreciated.  Skin:  Skin is warm, dry and intact. No rash noted. Psychiatric: Mood and affect are normal. Speech and behavior are normal. Patient exhibits appropriate insight and judgement.   ____________________________________________   LABS (all labs ordered are listed, but only abnormal results are displayed)  Labs Reviewed - No data to display ____________________________________________  EKG   ____________________________________________  RADIOLOGY   No results found.  ____________________________________________    PROCEDURES  Procedure(s) performed:    Procedures    Medications  Tdap (BOOSTRIX) injection 0.5 mL (has no administration in time range)     ____________________________________________   INITIAL IMPRESSION / ASSESSMENT AND PLAN / ED COURSE  Pertinent labs & imaging results that were available during my care of the patient were reviewed by me and considered in my medical decision making (see chart for details).  Review of the [place] CSRS was performed in accordance of the [place] prior to dispensing any controlled drugs.           Patient's  diagnosis is consistent with patient with flap style laceration to the left ankle.  Patient was trying to get ready for dinner when she struck her ankle on the wheelchair.  This created a small flap-like laceration.  Bleeding is controlled direct pressure.  Evaluation of the laceration measured 1.5 cm.  Easily controlled direct pressure but removal of the dressing does cause some ongoing slow venous bleeding.  Flap is extremely fibrotic joint and I do not feel that it would be amenable to suturing as I feel it is too thin to hold sutures.  We discussed treatment with the patient's daughters.  At this time area is cleansed, Surgicel dressing with Coban applied.  No bleedthrough at this time.  Tetanus shot will be updated.  Wound care instructions discussed with the daughters.  Follow-up with primary care as needed.  Return to the emergency department for any concerning signs or symptoms. Patient is given ED precautions to return to the ED for any worsening or new symptoms.  Addendum: Right after placing my initial dressing, patient was ambulated to the bathroom.  She had bleedthrough on the dressing.  I remove the dressing, infiltrated the area with lidocaine with epi with complete cessation of bleeding.  Redressed.  Patient was observed for 30 minutes with no bleedthrough.  Patient is stable for discharge.   ____________________________________________  FINAL CLINICAL IMPRESSION(S) / ED DIAGNOSES  Final diagnoses:  Laceration of left ankle, initial encounter      NEW MEDICATIONS STARTED DURING THIS VISIT:  ED Discharge Orders     None           This chart was dictated using voice recognition software/Dragon. Despite best efforts to proofread, errors can occur which can change the meaning. Any change was purely unintentional.    Darletta Moll, PA-C 11/26/20 1946    Daivon Rayos, Charline Bills, PA-C 11/26/20 2040    Vladimir Crofts, MD 11/26/20 [phone]

## 2020-11-26 NOTE — ED Triage Notes (Signed)
Pt arrives to ED from [facility] via [facility] with c/c of left ankle laceration. EMS departed prior to initial triage evaluation. [facility] staff believe laceration was from her wheel chair. Bleeding controlled at time of triage. Pt alert and oriented to self and situation but not date or location.

## 2020-11-26 NOTE — ED Triage Notes (Signed)
First Nurse Note:  Arrives from [facility] assisted living. Laceration to right ankle.  Staff believe laceration is from wheelchair. No fall.  Bleeding controlled.

## 2020-11-27 DIAGNOSIS — F411 Generalized anxiety disorder: Secondary | ICD-10-CM | POA: Diagnosis not present

## 2020-11-27 DIAGNOSIS — R269 Unspecified abnormalities of gait and mobility: Secondary | ICD-10-CM | POA: Diagnosis not present

## 2020-11-27 DIAGNOSIS — F339 Major depressive disorder, recurrent, unspecified: Secondary | ICD-10-CM | POA: Diagnosis not present

## 2020-11-27 DIAGNOSIS — C4490 Unspecified malignant neoplasm of skin, unspecified: Secondary | ICD-10-CM | POA: Diagnosis not present

## 2020-11-27 DIAGNOSIS — S91002S Unspecified open wound, left ankle, sequela: Secondary | ICD-10-CM | POA: Diagnosis not present

## 2020-11-27 DIAGNOSIS — I10 Essential (primary) hypertension: Secondary | ICD-10-CM | POA: Diagnosis not present

## 2020-11-27 DIAGNOSIS — K219 Gastro-esophageal reflux disease without esophagitis: Secondary | ICD-10-CM | POA: Diagnosis not present

## 2020-12-01 DIAGNOSIS — M170 Bilateral primary osteoarthritis of knee: Secondary | ICD-10-CM | POA: Diagnosis not present

## 2020-12-01 DIAGNOSIS — G4700 Insomnia, unspecified: Secondary | ICD-10-CM | POA: Diagnosis not present

## 2020-12-01 DIAGNOSIS — F17210 Nicotine dependence, cigarettes, uncomplicated: Secondary | ICD-10-CM | POA: Diagnosis not present

## 2020-12-01 DIAGNOSIS — C4442 Squamous cell carcinoma of skin of scalp and neck: Secondary | ICD-10-CM | POA: Diagnosis not present

## 2020-12-01 DIAGNOSIS — Z9181 History of falling: Secondary | ICD-10-CM | POA: Diagnosis not present

## 2020-12-01 DIAGNOSIS — Z87440 Personal history of urinary (tract) infections: Secondary | ICD-10-CM | POA: Diagnosis not present

## 2020-12-01 DIAGNOSIS — K219 Gastro-esophageal reflux disease without esophagitis: Secondary | ICD-10-CM | POA: Diagnosis not present

## 2020-12-01 DIAGNOSIS — R1310 Dysphagia, unspecified: Secondary | ICD-10-CM | POA: Diagnosis not present

## 2020-12-01 DIAGNOSIS — K5900 Constipation, unspecified: Secondary | ICD-10-CM | POA: Diagnosis not present

## 2020-12-01 DIAGNOSIS — I129 Hypertensive chronic kidney disease with stage 1 through stage 4 chronic kidney disease, or unspecified chronic kidney disease: Secondary | ICD-10-CM | POA: Diagnosis not present

## 2020-12-01 DIAGNOSIS — N189 Chronic kidney disease, unspecified: Secondary | ICD-10-CM | POA: Diagnosis not present

## 2020-12-01 DIAGNOSIS — S91012D Laceration without foreign body, left ankle, subsequent encounter: Secondary | ICD-10-CM | POA: Diagnosis not present

## 2020-12-02 DIAGNOSIS — K529 Noninfective gastroenteritis and colitis, unspecified: Secondary | ICD-10-CM | POA: Diagnosis not present

## 2020-12-02 DIAGNOSIS — R000 Tachycardia, unspecified: Secondary | ICD-10-CM | POA: Diagnosis not present

## 2020-12-10 DIAGNOSIS — I10 Essential (primary) hypertension: Secondary | ICD-10-CM | POA: Diagnosis not present

## 2020-12-10 DIAGNOSIS — M6281 Muscle weakness (generalized): Secondary | ICD-10-CM | POA: Diagnosis not present

## 2020-12-11 ENCOUNTER — Emergency Department: Payer: PPO

## 2020-12-11 ENCOUNTER — Inpatient Hospital Stay
Admission: EM | Admit: 2020-12-11 | Discharge: 2020-12-14 | DRG: 872 | Disposition: A | Discharge status: Home / Self Care | Payer: PPO | Source: Skilled Nursing Facility | Attending: Internal Medicine | Admitting: Internal Medicine

## 2020-12-11 ENCOUNTER — Other Ambulatory Visit: Payer: Self-pay

## 2020-12-11 DIAGNOSIS — N179 Acute kidney failure, unspecified: Secondary | ICD-10-CM | POA: Diagnosis not present

## 2020-12-11 DIAGNOSIS — N189 Chronic kidney disease, unspecified: Secondary | ICD-10-CM | POA: Diagnosis not present

## 2020-12-11 DIAGNOSIS — R197 Diarrhea, unspecified: Secondary | ICD-10-CM | POA: Diagnosis not present

## 2020-12-11 DIAGNOSIS — F32A Depression, unspecified: Secondary | ICD-10-CM | POA: Diagnosis present

## 2020-12-11 DIAGNOSIS — Z85828 Personal history of other malignant neoplasm of skin: Secondary | ICD-10-CM | POA: Diagnosis not present

## 2020-12-11 DIAGNOSIS — I10 Essential (primary) hypertension: Secondary | ICD-10-CM | POA: Diagnosis not present

## 2020-12-11 DIAGNOSIS — Z79899 Other long term (current) drug therapy: Secondary | ICD-10-CM | POA: Diagnosis not present

## 2020-12-11 DIAGNOSIS — E878 Other disorders of electrolyte and fluid balance, not elsewhere classified: Secondary | ICD-10-CM | POA: Diagnosis not present

## 2020-12-11 DIAGNOSIS — F419 Anxiety disorder, unspecified: Secondary | ICD-10-CM | POA: Diagnosis not present

## 2020-12-11 DIAGNOSIS — A419 Sepsis, unspecified organism: Principal | ICD-10-CM | POA: Diagnosis present

## 2020-12-11 DIAGNOSIS — E871 Hypo-osmolality and hyponatremia: Secondary | ICD-10-CM | POA: Diagnosis present

## 2020-12-11 DIAGNOSIS — R300 Dysuria: Secondary | ICD-10-CM | POA: Diagnosis present

## 2020-12-11 DIAGNOSIS — N1832 Chronic kidney disease, stage 3b: Secondary | ICD-10-CM | POA: Diagnosis present

## 2020-12-11 DIAGNOSIS — G459 Transient cerebral ischemic attack, unspecified: Secondary | ICD-10-CM | POA: Diagnosis not present

## 2020-12-11 DIAGNOSIS — Z20822 Contact with and (suspected) exposure to covid-19: Secondary | ICD-10-CM | POA: Diagnosis not present

## 2020-12-11 DIAGNOSIS — S22070A Wedge compression fracture of T9-T10 vertebra, initial encounter for closed fracture: Secondary | ICD-10-CM | POA: Diagnosis not present

## 2020-12-11 DIAGNOSIS — F17210 Nicotine dependence, cigarettes, uncomplicated: Secondary | ICD-10-CM | POA: Diagnosis present

## 2020-12-11 DIAGNOSIS — K529 Noninfective gastroenteritis and colitis, unspecified: Secondary | ICD-10-CM

## 2020-12-11 DIAGNOSIS — R0602 Shortness of breath: Secondary | ICD-10-CM | POA: Diagnosis not present

## 2020-12-11 DIAGNOSIS — D75839 Thrombocytosis, unspecified: Secondary | ICD-10-CM | POA: Diagnosis not present

## 2020-12-11 DIAGNOSIS — I129 Hypertensive chronic kidney disease with stage 1 through stage 4 chronic kidney disease, or unspecified chronic kidney disease: Secondary | ICD-10-CM | POA: Diagnosis not present

## 2020-12-11 DIAGNOSIS — E440 Moderate protein-calorie malnutrition: Secondary | ICD-10-CM | POA: Diagnosis not present

## 2020-12-11 DIAGNOSIS — E861 Hypovolemia: Secondary | ICD-10-CM | POA: Diagnosis present

## 2020-12-11 DIAGNOSIS — E860 Dehydration: Secondary | ICD-10-CM | POA: Diagnosis not present

## 2020-12-11 DIAGNOSIS — S2231XA Fracture of one rib, right side, initial encounter for closed fracture: Secondary | ICD-10-CM | POA: Diagnosis not present

## 2020-12-11 DIAGNOSIS — A09 Infectious gastroenteritis and colitis, unspecified: Secondary | ICD-10-CM | POA: Diagnosis not present

## 2020-12-11 DIAGNOSIS — R0902 Hypoxemia: Secondary | ICD-10-CM | POA: Diagnosis not present

## 2020-12-11 DIAGNOSIS — Z681 Body mass index (BMI) 19 or less, adult: Secondary | ICD-10-CM | POA: Diagnosis not present

## 2020-12-11 DIAGNOSIS — R531 Weakness: Secondary | ICD-10-CM | POA: Diagnosis not present

## 2020-12-11 DIAGNOSIS — S3282XA Multiple fractures of pelvis without disruption of pelvic ring, initial encounter for closed fracture: Secondary | ICD-10-CM | POA: Diagnosis not present

## 2020-12-11 DIAGNOSIS — R6251 Failure to thrive (child): Secondary | ICD-10-CM | POA: Diagnosis not present

## 2020-12-11 LAB — BASIC METABOLIC PANEL
Anion gap: 17 — ABNORMAL HIGH (ref 5–15)
BUN: 42 mg/dL — ABNORMAL HIGH (ref 8–23)
CO2: 21 mmol/L — ABNORMAL LOW (ref 22–32)
Calcium: 10.3 mg/dL (ref 8.9–10.3)
Chloride: 96 mmol/L — ABNORMAL LOW (ref 98–111)
Creatinine, Ser: 1.91 mg/dL — ABNORMAL HIGH (ref 0.44–1.00)
GFR, Estimated: 26 mL/min — ABNORMAL LOW (ref >60)
Glucose, Bld: 109 mg/dL — ABNORMAL HIGH (ref 70–99)
Potassium: 4.3 mmol/L (ref 3.5–5.1)
Sodium: 134 mmol/L — ABNORMAL LOW (ref 135–145)

## 2020-12-11 LAB — CBC WITH DIFFERENTIAL/PLATELET
Abs Immature Granulocytes: 1.51 10*3/uL — ABNORMAL HIGH (ref 0.00–0.07)
Basophils Absolute: 0.2 10*3/uL — ABNORMAL HIGH (ref 0.0–0.1)
Basophils Relative: 0 %
Eosinophils Absolute: 0 10*3/uL (ref 0.0–0.5)
Eosinophils Relative: 0 %
HCT: 43.6 % (ref 36.0–46.0)
Hemoglobin: 13.1 g/dL (ref 12.0–15.0)
Immature Granulocytes: 4 %
Lymphocytes Relative: 3 %
Lymphs Abs: 1.4 10*3/uL (ref 0.7–4.0)
MCH: 22.7 pg — ABNORMAL LOW (ref 26.0–34.0)
MCHC: 30 g/dL (ref 30.0–36.0)
MCV: 75.6 fL — ABNORMAL LOW (ref 80.0–100.0)
Monocytes Absolute: 1.9 10*3/uL — ABNORMAL HIGH (ref 0.1–1.0)
Monocytes Relative: 5 %
Neutro Abs: 35.7 10*3/uL — ABNORMAL HIGH (ref 1.7–7.7)
Neutrophils Relative %: 88 %
Platelets: 1236 10*3/uL (ref 150–400)
RBC: 5.77 MIL/uL — ABNORMAL HIGH (ref 3.87–5.11)
RDW: 20.9 % — ABNORMAL HIGH (ref 11.5–15.5)
Smear Review: INCREASED
WBC: 40.7 10*3/uL — ABNORMAL HIGH (ref 4.0–10.5)
nRBC: 0 % (ref 0.0–0.2)

## 2020-12-11 LAB — LIPASE, BLOOD: Lipase: 42 U/L (ref 11–51)

## 2020-12-11 LAB — RESP PANEL BY RT-PCR (FLU A&B, COVID) ARPGX2
Influenza A by PCR: NEGATIVE
Influenza B by PCR: NEGATIVE
SARS Coronavirus 2 by RT PCR: NEGATIVE

## 2020-12-11 LAB — HEPATIC FUNCTION PANEL
ALT: 18 U/L (ref 0–44)
AST: 28 U/L (ref 15–41)
Albumin: 4 g/dL (ref 3.5–5.0)
Alkaline Phosphatase: 121 U/L (ref 38–126)
Bilirubin, Direct: 0.1 mg/dL (ref 0.0–0.2)
Indirect Bilirubin: 1.5 mg/dL — ABNORMAL HIGH (ref 0.3–0.9)
Total Bilirubin: 1.6 mg/dL — ABNORMAL HIGH (ref 0.3–1.2)
Total Protein: 7.7 g/dL (ref 6.5–8.1)

## 2020-12-11 LAB — LACTIC ACID, PLASMA: Lactic Acid, Venous: 1.4 mmol/L (ref 0.5–1.9)

## 2020-12-11 LAB — PATHOLOGIST SMEAR REVIEW

## 2020-12-11 MED ORDER — ONDANSETRON HCL 4 MG/2ML IJ SOLN
4.0000 mg | Freq: Four times a day (QID) | INTRAMUSCULAR | Status: DC | PRN
  Administered 2020-12-12: 16:00:00 4 mg via INTRAVENOUS
  Filled 2020-12-11: qty 2

## 2020-12-11 MED ORDER — ACETAMINOPHEN 325 MG PO TABS: 650.0000 mg | ORAL_TABLET | Freq: Four times a day (QID) | ORAL | Status: DC | PRN

## 2020-12-11 MED ORDER — ENOXAPARIN SODIUM 40 MG/0.4ML IJ SOSY
40.0000 mg | PREFILLED_SYRINGE | INTRAMUSCULAR | Status: DC
  Filled 2020-12-11: qty 0.4

## 2020-12-11 MED ORDER — MELATONIN 5 MG PO TABS
2.5000 mg | ORAL_TABLET | Freq: Every day | ORAL | Status: DC
  Administered 2020-12-12 – 2020-12-13 (×2): 2.5 mg via ORAL
  Filled 2020-12-11 (×3): qty 0.5
  Filled 2020-12-11: qty 1

## 2020-12-11 MED ORDER — LORATADINE 10 MG PO TABS
10.0000 mg | ORAL_TABLET | Freq: Every day | ORAL | Status: DC
  Administered 2020-12-12: 10 mg via ORAL
  Filled 2020-12-11 (×3): qty 1

## 2020-12-11 MED ORDER — ONDANSETRON HCL 4 MG PO TABS: 4.0000 mg | ORAL_TABLET | Freq: Four times a day (QID) | ORAL | Status: DC | PRN

## 2020-12-11 MED ORDER — AMLODIPINE BESYLATE 5 MG PO TABS
2.5000 mg | ORAL_TABLET | Freq: Every day | ORAL | Status: DC
  Administered 2020-12-12: 2.5 mg via ORAL
  Filled 2020-12-11 (×3): qty 1

## 2020-12-11 MED ORDER — METRONIDAZOLE 500 MG/100ML IV SOLN
500.0000 mg | Freq: Two times a day (BID) | INTRAVENOUS | Status: DC
  Administered 2020-12-12: 500 mg via INTRAVENOUS
  Filled 2020-12-11: qty 100

## 2020-12-11 MED ORDER — SODIUM CHLORIDE 0.9 % IV SOLN: 2.0000 g | INTRAVENOUS | Status: DC

## 2020-12-11 MED ORDER — SODIUM CHLORIDE 0.9 % IV BOLUS
1000.0000 mL | Freq: Once | INTRAVENOUS | Status: AC
  Administered 2020-12-11: 1000 mL via INTRAVENOUS

## 2020-12-11 MED ORDER — VANCOMYCIN 50 MG/ML ORAL SOLUTION: 125.0000 mg | Freq: Four times a day (QID) | ORAL | Status: DC

## 2020-12-11 MED ORDER — VANCOMYCIN HCL 125 MG PO CAPS
125.0000 mg | ORAL_CAPSULE | Freq: Four times a day (QID) | ORAL | Status: DC
  Administered 2020-12-12: 125 mg via ORAL
  Filled 2020-12-11 (×6): qty 1

## 2020-12-11 MED ORDER — TRAZODONE HCL 50 MG PO TABS: 25.0000 mg | ORAL_TABLET | Freq: Every evening | ORAL | Status: DC | PRN

## 2020-12-11 MED ORDER — VITAMIN D3 25 MCG (1000 UNIT) PO TABS
2000.0000 [IU] | ORAL_TABLET | Freq: Every day | ORAL | Status: DC
Start: 2020-12-11 — End: 2020-12-14
  Administered 2020-12-12: 2000 [IU] via ORAL
  Filled 2020-12-11 (×6): qty 2

## 2020-12-11 MED ORDER — SODIUM CHLORIDE 0.9 % IV SOLN
1.0000 g | Freq: Once | INTRAVENOUS | Status: DC
  Filled 2020-12-11: qty 10

## 2020-12-11 MED ORDER — BUPROPION HCL ER (XL) 150 MG PO TB24: 300.0000 mg | ORAL_TABLET | Freq: Every day | ORAL | Status: DC

## 2020-12-11 MED ORDER — METRONIDAZOLE 500 MG/100ML IV SOLN
500.0000 mg | Freq: Once | INTRAVENOUS | Status: AC
  Administered 2020-12-11: 500 mg via INTRAVENOUS

## 2020-12-11 MED ORDER — SODIUM CHLORIDE 0.9 % IV SOLN: INTRAVENOUS | Status: DC

## 2020-12-11 MED ORDER — TRAZODONE HCL 50 MG PO TABS
50.0000 mg | ORAL_TABLET | Freq: Every day | ORAL | Status: DC
  Administered 2020-12-12 – 2020-12-13 (×2): 50 mg via ORAL
  Filled 2020-12-11 (×3): qty 1

## 2020-12-11 MED ORDER — VANCOMYCIN HCL 125 MG PO CAPS
125.0000 mg | ORAL_CAPSULE | Freq: Once | ORAL | Status: AC
  Administered 2020-12-11: 125 mg via ORAL
  Filled 2020-12-11: qty 1

## 2020-12-11 MED ORDER — MAGNESIUM HYDROXIDE 400 MG/5ML PO SUSP
30.0000 mL | Freq: Every day | ORAL | Status: DC | PRN
  Filled 2020-12-11: qty 30

## 2020-12-11 MED ORDER — HYDROCHLOROTHIAZIDE 12.5 MG PO TABS
12.5000 mg | ORAL_TABLET | Freq: Every day | ORAL | Status: DC
  Administered 2020-12-12: 12.5 mg via ORAL
  Filled 2020-12-11 (×3): qty 1

## 2020-12-11 MED ORDER — SODIUM CHLORIDE 0.9 % IV SOLN
1.0000 g | Freq: Once | INTRAVENOUS | Status: AC
  Administered 2020-12-11: 1 g via INTRAVENOUS

## 2020-12-11 MED ORDER — PANTOPRAZOLE SODIUM 40 MG PO TBEC
40.0000 mg | DELAYED_RELEASE_TABLET | Freq: Every day | ORAL | Status: DC
  Administered 2020-12-12: 40 mg via ORAL
  Filled 2020-12-11 (×3): qty 1

## 2020-12-11 MED ORDER — ACETAMINOPHEN 650 MG RE SUPP: 650.0000 mg | Freq: Four times a day (QID) | RECTAL | Status: DC | PRN

## 2020-12-11 NOTE — ED Provider Notes (Signed)
  Emergency Medicine Provider Triage Evaluation Note  Tina Ingram , a 86 y.o.female,  was evaluated in triage.  Pt complains of fatigue and altered mental status for the past week.  He is accompanied by her sister who states that the patient has been behaving abnormally and has had difficulty eating her food.  She has been profoundly weak and has had intermittent diarrhea.  Patient has also been complaining about her head hurting.  Denies any pain in the chest, abdomen, or flanks at this time.  No fever or chills.    Review of Systems  Positive: Fatigue, AMS, headache Negative: Denies fever, chest pain, vomiting  Physical Exam  There were no vitals filed for this visit. Gen:   Awake, no distress   Resp:  Normal effort  MSK:   Moves extremities without difficulty  Other:    Medical Decision Making  Given the patient's initial medical screening exam, the following diagnostic evaluation has been ordered. The patient will be placed in the appropriate treatment space, once one is available, to complete the evaluation and treatment. I have discussed the plan of care with the patient and I have advised the patient that an ED physician or mid-level practitioner will reevaluate their condition after the test results have been received, as the results may give them additional insight into the type of treatment they may need.    Diagnostics: Head CT, CXR, labs, UA, respiratory panel  Treatments: none immediately   Teodoro Spray, PA 12/11/20 1336    Vladimir Crofts, MD 12/11/20 [phone]

## 2020-12-11 NOTE — ED Notes (Signed)
1236 platelet count

## 2020-12-11 NOTE — ED Notes (Signed)
CT reports unable to perform scan d/t pt trying to get out of w/c on way, confused, asking for sister. Pt to be next bed.

## 2020-12-11 NOTE — ED Notes (Signed)
Pt is from [facility], decreased po intake, decreased water intake, diarrhea today, axox4, weaker than normal, not able to stand on her own according to staff which is unusual 98.2 96% RA 121/60 102 pulse

## 2020-12-11 NOTE — ED Notes (Signed)
Resting quietly. Easily aroused. More confused now that friend has left. States she does not remember talking to the doctor. VSS.No signs of distress.

## 2020-12-11 NOTE — H&P (Signed)
[place]   PATIENT NAME: Tina Ingram    MR#:  [id number]  DATE OF BIRTH:  [birth year]  DATE OF ADMISSION:  12/11/2020  PRIMARY CARE PHYSICIAN: Derinda Late, MD   Patient is coming from: Home  REQUESTING/REFERRING PHYSICIAN: Rada Hay, MD  CHIEF COMPLAINT:   Chief Complaint  Patient presents with   Diarrhea    HISTORY OF PRESENT ILLNESS:  Tina Ingram is a 86 y.o.  Caucasian female with medical history significant for hypertension and tobacco abuse who presents to the emergency room with acute onset of generalized weakness and intractable diarrhea which has been intermittent over the last 3 weeks.  She describes it as watery without blood.  She has associated abdominal cramps as well as nausea and vomiting.  According to her daughter she has been more confused and has not been having much appetite.  She has some dysuria without urinary frequency or urgency or hematuria or flank pain.  No cough or wheezing or dyspnea.  No chest pain or palpitations.  ED Course: When she came to the ER blood pressure was 161/83 with otherwise normal vital signs.  Labs revealed mild hyponatremia hypochloremia BUN of 42 and creatinine 1.91 with anion gap of 17.  Total bili was 1.6 with indirect bili 1.5.  Lactic acid was 1.4 and CBC showed leukocytosis of 14.7 with neutrophilia and significant thrombocytosis of 1236.  Influenza antigens and COVID-19 PCR came back negative.  Blood cultures were drawn.  Stool C. difficile and GI pathogen were sent. EKG as reviewed by me : EKG showed sinus tachycardia with rate 123 with sagging ST segment MI ST segment depression on antero lateral leads. Imaging: Abdominal pelvic CT scan revealed the following: 1. Abnormal wall thickening in the sigmoid colon and rectum is nonspecific for cause although infectious distal colitis would be a top differential diagnostic consideration. There no pneumatosis or other specific indicators of ischemic bowel  although there is substantial atherosclerosis. 2. 5 mm left solid pulmonary nodule. No routine follow-up imaging is recommended per Fleischner Society Guidelines. These guidelines do not apply to immunocompromised patients and patients with cancer. Follow up in patients with significant comorbidities as clinically warranted. For lung cancer screening, adhere to Lung-RADS guidelines. Reference: Radiology. 2017; 284(1):228-43. 3. Pelvic fractures as shown on recent CT. There is some mildly increased sclerosis at the transverse S3 deformity which could indicate active stress fracture of the sacrum. 4. Other imaging findings of potential clinical significance: Aortic Atherosclerosis (ICD10-I70.0). Coronary atherosclerosis. Cholelithiasis. Right renal cysts. Old granulomatous disease. Anterior fundal fibroid in the uterus. Remote vertebral augmentations at T9 and T11 with likely chronic mild superior endplate compression at L2.  Noncontrasted CT scan revealed the following  Atrophy with small vessel chronic ischemic changes of deep cerebral white matter.   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   No acute intracranial abnormalities.  The patient was given IV Rocephin, Flagyl and p.o. vancomycin.  She will be admitted to a medical telemetry bed for further evaluation and management. 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   left vertex scalp - MO[facility]/21/2020 ([facility] nasal tip  PAST SURGICAL HISTORY:   Past Surgical History:  Procedure Laterality Date   APPENDECTOMY     BREAST BIOPSY Left 05/16/2014   complex sclerosing lesion    BREAST EXCISIONAL BIOPSY Left 2016    surgical exc to remove complex sclerosing lesion   BREAST LUMPECTOMY WITH RADIOACTIVE SEED LOCALIZATION Left 06/20/2014   Procedure: BREAST LUMPECTOMY WITH RADIOACTIVE SEED LOCALIZATION;  Surgeon: Erroll Luna, MD;  Location: [place];  Service: General;  Laterality: Left;   CAROTID ENDARTERECTOMY Right    CATARACT EXTRACTION W/PHACO Right 06/25/2015   Procedure: CATARACT EXTRACTION PHACO AND INTRAOCULAR LENS PLACEMENT ([place]);  Surgeon: Estill Cotta, MD;  Location: [facility];  Service: Ophthalmology;  Laterality: Right;  [place] 2.01 AP% 24.5 CDE 52.16 Fluid pack lot # [id number]   COLONOSCOPY     COLONOSCOPY WITH PROPOFOL N/A 07/07/2020   Procedure: COLONOSCOPY WITH PROPOFOL;  Surgeon: Lesly Rubenstein, MD;  Location: [facility];  Service: Endoscopy;  Laterality: N/A;   ESOPHAGOGASTRODUODENOSCOPY N/A 07/10/2020   Procedure: ESOPHAGOGASTRODUODENOSCOPY (EGD);  Surgeon: Jonathon Bellows, MD;  Location: [facility] ENDOSCOPY;  Service: Gastroenterology;  Laterality: N/A;   ESOPHAGOGASTRODUODENOSCOPY (EGD) WITH PROPOFOL N/A 07/06/2020   Procedure: ESOPHAGOGASTRODUODENOSCOPY (EGD) WITH PROPOFOL;  Surgeon: [name], MD;  Location: [facility] ENDOSCOPY;  Service: Endoscopy;  Laterality: N/A;   EYE SURGERY     KYPHOPLASTY N/A 01/27/2017   Procedure: [id number];  Surgeon: Hessie Knows, MD;  Location: [facility];  Service: Orthopedics;  Laterality: N/A;  T-9     SOCIAL HISTORY:   Social History   Tobacco Use   Smoking status: Every Day    Packs/day: 1.00    Types: Cigarettes   Smokeless tobacco: Never  Substance Use Topics   Alcohol use: No    FAMILY HISTORY:   Family History  Problem Relation Age of Onset   Breast cancer Neg Hx     DRUG ALLERGIES:  No Known Allergies  REVIEW OF SYSTEMS:   ROS As per history of present illness. All pertinent systems were reviewed above. Constitutional, HEENT, cardiovascular, respiratory, GI, GU, musculoskeletal, neuro, psychiatric,  endocrine, integumentary and hematologic systems were reviewed and are otherwise negative/unremarkable except for positive findings mentioned above in the HPI.   MEDICATIONS AT HOME:   Prior to Admission medications   Medication Sig Start Date End Date Taking? Authorizing Provider  acetaminophen (TYLENOL) 325 MG tablet Take 2 tablets (650 mg total) by mouth every 6 (six) hours as needed for mild pain (or Fever >/= 101). 09/23/20   Wyvonnia Dusky, MD  amLODipine (NORVASC) 2.5 MG tablet Take 2.5 mg by mouth daily.    [provider]  bacitracin ointment Apply topically 2 (two) times daily. 09/23/20   Wyvonnia Dusky, MD  buPROPion (WELLBUTRIN XL) 300 MG 24 hr tablet Take 300 mg by mouth at bedtime. 07/18/20   [provider]  cetirizine (ZYRTEC) 10 MG tablet Take 10 mg by mouth daily.    [provider]  Cholecalciferol (VITAMIN D) 50 MCG (2000 UT) tablet Take 2,000 Units by mouth daily.    [provider]  diclofenac Sodium (VOLTAREN) 1 % GEL Apply 1 application topically 4 (four) times daily as needed (pain).    [provider]  hydrochlorothiazide (HYDRODIURIL) 12.5 MG tablet Take 12.5 mg by mouth daily.    [provider]  melatonin 3 MG TABS tablet Take 3 mg by mouth at bedtime.    [provider]  mupirocin ointment (BACTROBAN) 2 %  06/21/20  [provider]  pantoprazole (PROTONIX) 40 MG tablet Take 40 mg by mouth daily.    [provider]  traZODone (DESYREL) 50 MG tablet Take 50 mg by mouth at bedtime. 12/25/18   [provider]      VITAL SIGNS:  Blood pressure (!) 156/82, pulse 95, temperature 97.8 F (36.6 C), temperature source Oral, resp. rate 18, height 5\' 5"  (1.651 m), weight 48 kg, SpO2 96 %.  PHYSICAL EXAMINATION:  Physical Exam  GENERAL:  86 y.o.-year-old Caucasian female patient lying in the bed with no acute distress.  EYES: Pupils equal, round, reactive to light and  accommodation. No scleral icterus. Extraocular muscles intact.  HEENT: Head atraumatic, normocephalic. Oropharynx and nasopharynx clear.  NECK:  Supple, no jugular venous distention. No thyroid enlargement, no tenderness.  LUNGS: Normal breath sounds bilaterally, no wheezing, rales,rhonchi or crepitation. No use of accessory muscles of respiration.  CARDIOVASCULAR: Regular rate and rhythm, S1, S2 normal. No murmurs, rubs, or gallops.  ABDOMEN: Soft, nondistended, nontender. Bowel sounds present. No organomegaly or mass.  EXTREMITIES: No pedal edema, cyanosis, or clubbing.  NEUROLOGIC: Cranial nerves II through XII are intact. Muscle strength 5/5 in all extremities. Sensation intact. Gait not checked.  PSYCHIATRIC: The patient is alert and oriented x 3.  Normal affect and good eye contact. SKIN: No obvious rash, lesion, or ulcer.   LABORATORY PANEL:   CBC Recent Labs  Lab 12/11/20 1329  WBC 40.7*  HGB 13.1  HCT 43.6  PLT 1,236*   ------------------------------------------------------------------------------------------------------------------  Chemistries  Recent Labs  Lab 12/11/20 1329  NA 134*  K 4.3  CL 96*  CO2 21*  GLUCOSE 109*  BUN 42*  CREATININE 1.91*  CALCIUM 10.3  AST 28  ALT 18  ALKPHOS 121  BILITOT 1.6*   ------------------------------------------------------------------------------------------------------------------  Cardiac Enzymes No results for input(s): TROPONINI in the last 168 hours. ------------------------------------------------------------------------------------------------------------------  RADIOLOGY:  CT ABDOMEN PELVIS WO CONTRAST  Result Date: 12/11/2020 CLINICAL DATA:  Acute abdominal pain. Failure to thrive. Diarrhea. Back pain. EXAM: CT ABDOMEN AND PELVIS WITHOUT CONTRAST TECHNIQUE: Multidetector CT imaging of the abdomen and pelvis was performed following the standard protocol without IV contrast. COMPARISON:  06/12/2011 and CT pelvis  09/18/2020 FINDINGS: Lower chest: Old right posterior rib deformities related to old fractures. Left lower lobe density potentially from methacrylate or old granulomatous disease. Nonspecific 5 by 4 mm left lower lobe nodule on image 6 series 3. Substantial aortic and coronary atherosclerotic vascular disease. In calcified left infrahilar lymph nodes. Reduced volume of the left breast compared to the right likely due to prior excision a biopsy/lumpectomy. Hepatobiliary: Suspected small gallstones in the gallbladder on image 42 series 2. Otherwise unremarkable. Pancreas: Unremarkable Spleen: Punctate calcification favoring old granulomatous disease. Upper normal splenic size. Adrenals/Urinary Tract: Hypodense right renal lesions favoring cysts. No hydronephrosis or hydroureter. No urinary tract calculi identified. Stomach/Bowel: There is wall thickening in the sigmoid colon and rectum both of which are nondistended. Frothy fluid in the rectum, query diarrheal process. Prior postoperative findings along the cecum. No dilated small bowel. Vascular/Lymphatic: Dense atherosclerotic calcification noted this includes the aortoiliac tree and also other systemic arterial vessels including the SMA. No pathologic adenopathy is identified. Reproductive: Speckled calcification in anterior nodularity along the uterine fundus probably from a fibroid. Otherwise unremarkable. Other: No supplemental non-categorized findings. Musculoskeletal: Old healed deformities of the left pubic rami. Old healed deformity of the left sacral ala. Substantial dextroconvex thoracolumbar scoliosis. Transverse irregularity at the S3 level of the sacrum  suspicious for prior fracture in this vicinity. This has a slightly more sclerotic appearance than it did on 09/18/2020 and an active stress fracture in this region cannot be excluded. 5 mm of degenerative grade 1 anterolisthesis at L5-S1. 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 Vertebral augmentations  associated with compression fractures at T9 and T11. Likely chronic mild superior endplate compression at L2. IMPRESSION: 1. Abnormal wall thickening in the sigmoid colon and rectum is nonspecific for cause although infectious distal colitis would be a top differential diagnostic consideration. There no pneumatosis or other specific indicators of ischemic bowel although there is substantial atherosclerosis. 2. 5 mm left solid pulmonary nodule. No routine follow-up imaging is recommended per Fleischner Society Guidelines. These guidelines do not apply to immunocompromised patients and patients with cancer. Follow up in patients with significant comorbidities as clinically warranted. For lung cancer screening, adhere to Lung-RADS guidelines. Reference: Radiology. 2017; 284(1):228-43. 3. Pelvic fractures as shown on recent CT. There is some mildly increased sclerosis at the transverse S3 deformity which could indicate active stress fracture of the sacrum. 4. Other imaging findings of potential clinical significance: Aortic Atherosclerosis (ICD10-I70.0). Coronary atherosclerosis. Cholelithiasis. Right renal cysts. Old granulomatous disease. Anterior fundal fibroid in the uterus. Remote vertebral augmentations at T9 and T11 with likely chronic mild superior endplate compression at L2. Electronically Signed   By: Van Clines M.D.   On: 12/11/2020 15:47   DG Chest 2 View  Result Date: 12/11/2020 CLINICAL DATA:  Weakness, shortness of breath EXAM: CHEST - 2 VIEW COMPARISON:  07/05/2020 FINDINGS: Shallow inspiration with low lung volumes. Chronic interstitial changes. Calcified granuloma at the left lung base. Similar cardiomediastinal contours. Osseous demineralization with treated thoracic compression fractures. Chronic right rib fractures. IMPRESSION: No acute process in the chest. Electronically Signed   By: Macy Mis M.D.   On: 12/11/2020 15:30   CT Head Wo Contrast  Result Date:  12/11/2020 CLINICAL DATA:  TIA, failure to thrive for past few days, back pain, diarrhea hypertension, smoker EXAM: CT HEAD WITHOUT CONTRAST TECHNIQUE: Contiguous axial images were obtained from the base of the skull through the vertex without intravenous contrast. COMPARISON:  11/20/2020 FINDINGS: Brain: Generalized atrophy. Normal ventricular morphology. No midline shift or mass effect. Small vessel chronic ischemic changes of deep cerebral white matter. 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 Small old LEFT cerebellar infarct. No intracranial hemorrhage, mass lesion, evidence of acute infarction, or extra-axial fluid collection. Vascular: No hyperdense vessels. Atherosclerotic calcification of internal carotid arteries bilaterally at skull base Skull: Prior RIGHT parietal craniotomy.  Calvaria otherwise intact. Sinuses/Orbits: Partial opacification of RIGHT frontal sinus. Small mucosal retention cyst LEFT sphenoid sinus. Other: N/A IMPRESSION: Atrophy with small vessel chronic ischemic changes of deep cerebral white matter. 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 No acute intracranial abnormalities. Electronically Signed   By: Lavonia Dana M.D.   On: 12/11/2020 15:39      IMPRESSION AND PLAN:  Principal Problem:   Infectious colitis  1.  Infectious colitis with subsequent sepsis as manifested by leukocytosis and tachycardia. - The patient will be admitted to a medical telemetry bed. - We will continue IV antibiotic therapy with IV Rocephin and and Flagyl as well as add p.o. vancomycin pending C. difficile and GI pathogen results. -Pain management to be provided. - We will continue hydration with IV normal saline. - We will follow blood cultures. - We will follow lactic acid.  2.  Severe thrombocytosis. - While this is likely  reactive we will follow platelets and if still elevated she may benefit from hematology consult.  3.  Acute kidney injury likely prerenal  due to recurrent diarrhea and hypovolemia and dehydration. - The patient will be hydrated with IV normal saline and will follow BMP.  4.  Depression and anxiety.. - We will continue Wellbutrin XL and BuSpar.  DVT prophylaxis: Lovenox.  Code Status: full code.  Family Communication:  The plan of care was discussed in details with the patient (and family). I answered all questions. The patient agreed to proceed with the above mentioned plan. Further management will depend upon hospital course. Disposition Plan: Back to previous home environment Consults called: none.  All the records are reviewed and case discussed with ED provider.  Status is: Inpatient   Remains inpatient appropriate because:Ongoing diagnostic testing needed not appropriate for outpatient work up, Unsafe d/c plan, IV treatments appropriate due to intensity of illness or inability to take PO, and Inpatient level of care appropriate due to severity of illness   Dispo: The patient is from: Home              Anticipated d/c is to: Home              Patient currently is not medically stable to d/c.              Difficult to place patient: No  TOTAL TIME TAKING CARE OF THIS PATIENT: 55 minutes.     Christel Mormon M.D on 12/11/2020 at 6:29 PM  [facility]   From 7 PM-7 AM, contact night-coverage [email or web address]  CC: Primary care physician; Derinda Late, MD

## 2020-12-11 NOTE — ED Provider Notes (Signed)
[facility]  ____________________________________________   Event Date/Time   First MD Initiated Contact with Patient 12/11/20 1454     (approximate)  I have reviewed the triage vital signs and the nursing notes.   HISTORY  Chief Complaint Diarrhea    HPI Tina Ingram is a 86 y.o. female with past medical history of hypertension, anemia who presents with generalized weakness and diarrhea.  Patient tells me that diarrhea has been going on for about 3 weeks.  Described as watery without blood.  She has no associate abdominal pain nausea vomiting.  Her patient's daughter Vaughan Basta tells me that she has been more confused and is not eating or drinking well.  Overall just much weaker than her normal self.  At baseline she does have some confusion but is worse.  Patient does endorse some dysuria.  Denies cough congestion shortness of breath or chest pain.         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   left vertex scalp - [facility]/21/2020 ([facility] nasal tip    Patient Active Problem List   Diagnosis Date Noted   UTI (urinary tract infection) 09/18/2020   Fall at home, initial encounter 09/18/2020   Generalized weakness 09/18/2020   GERD without esophagitis 09/18/2020   Skin cancer 09/10/2020   Symptomatic anemia    Iron deficiency anemia due to chronic blood loss    Hypokalemia    AKI (acute kidney injury) ([place])    Essential hypertension    Tobacco dependence    GI bleed 07/05/2020    Past Surgical History:  Procedure Laterality Date   APPENDECTOMY     BREAST BIOPSY Left 05/16/2014   complex sclerosing lesion    BREAST EXCISIONAL BIOPSY Left 2016   surgical exc to remove complex sclerosing  lesion   BREAST LUMPECTOMY WITH RADIOACTIVE SEED LOCALIZATION Left 06/20/2014   Procedure: BREAST LUMPECTOMY WITH RADIOACTIVE SEED LOCALIZATION;  Surgeon: Erroll Luna, MD;  Location: [facility];  Service: General;  Laterality: Left;   CAROTID ENDARTERECTOMY Right    CATARACT EXTRACTION W/PHACO Right 06/25/2015   Procedure: CATARACT EXTRACTION PHACO AND INTRAOCULAR LENS PLACEMENT ([place]);  Surgeon: Estill Cotta, MD;  Location: [facility];  Service: Ophthalmology;  Laterality: Right;  [place] 2.01 AP% 24.5 CDE 52.16 Fluid pack lot # [id number]   COLONOSCOPY     COLONOSCOPY WITH PROPOFOL N/A 07/07/2020   Procedure: COLONOSCOPY WITH PROPOFOL;  Surgeon: Lesly Rubenstein, MD;  Location: [facility];  Service: Endoscopy;  Laterality: N/A;   ESOPHAGOGASTRODUODENOSCOPY N/A 07/10/2020   Procedure: ESOPHAGOGASTRODUODENOSCOPY (EGD);  Surgeon: Jonathon Bellows, MD;  Location: [facility] ENDOSCOPY;  Service: Gastroenterology;  Laterality: N/A;   ESOPHAGOGASTRODUODENOSCOPY (EGD) WITH PROPOFOL N/A 07/06/2020   Procedure: ESOPHAGOGASTRODUODENOSCOPY (EGD) WITH PROPOFOL;  Surgeon: [name], MD;  Location: [facility] ENDOSCOPY;  Service: Endoscopy;  Laterality: N/A;   EYE SURGERY     KYPHOPLASTY N/A 01/27/2017   Procedure: [id number];  Surgeon: Hessie Knows, MD;  Location: [facility];  Service: Orthopedics;  Laterality: N/A;  T-9     Prior to Admission medications   Medication Sig Start Date End Date Taking? Authorizing Provider  acetaminophen (TYLENOL) 325 MG tablet Take 2  tablets (650 mg total) by mouth every 6 (six) hours as needed for mild pain (or Fever >/= 101). 09/23/20   Wyvonnia Dusky, MD  amLODipine (NORVASC) 2.5 MG tablet Take 2.5 mg by mouth daily.    [provider]  bacitracin ointment Apply topically 2 (two) times daily. 09/23/20   Wyvonnia Dusky, MD  buPROPion (WELLBUTRIN XL) 300 MG 24 hr tablet Take 300 mg by mouth at bedtime. 07/18/20   [provider]  cetirizine  (ZYRTEC) 10 MG tablet Take 10 mg by mouth daily.    [provider]  Cholecalciferol (VITAMIN D) 50 MCG (2000 UT) tablet Take 2,000 Units by mouth daily.    [provider]  diclofenac Sodium (VOLTAREN) 1 % GEL Apply 1 application topically 4 (four) times daily as needed (pain).    [provider]  hydrochlorothiazide (HYDRODIURIL) 12.5 MG tablet Take 12.5 mg by mouth daily.    [provider]  melatonin 3 MG TABS tablet Take 3 mg by mouth at bedtime.    [provider]  mupirocin ointment (BACTROBAN) 2 %  06/21/20   [provider]  pantoprazole (PROTONIX) 40 MG tablet Take 40 mg by mouth daily.    [provider]  traZODone (DESYREL) 50 MG tablet Take 50 mg by mouth at bedtime. 12/25/18   [provider]    Allergies Patient has no known allergies.  Family History  Problem Relation Age of Onset   Breast cancer Neg Hx     Social History Social History   Tobacco Use   Smoking status: Every Day    Packs/day: 1.00    Types: Cigarettes   Smokeless tobacco: Never  Vaping Use   Vaping Use: Never used  Substance Use Topics   Alcohol use: No   Drug use: Never    Review of Systems   Review of Systems  Constitutional:  Positive for appetite change. Negative for chills and fever.  HENT:  Negative for congestion.   Respiratory:  Negative for cough and shortness of breath.   Cardiovascular:  Negative for chest pain.  Gastrointestinal:  Positive for diarrhea. Negative for blood in stool, constipation, nausea and vomiting.  Genitourinary:  Positive for dysuria.  Neurological:  Negative for headaches.  Psychiatric/Behavioral:  Positive for confusion.   All other systems reviewed and are negative.  Physical Exam Updated Vital Signs BP 103/65   Pulse (!) 122   Temp 98.1 F (36.7 C) (Oral)   Resp 20   Ht 5\' 5"  (1.651 m)   Wt 48 kg   SpO2 97%   BMI 17.61 kg/m   Physical Exam Vitals and nursing note reviewed.   Constitutional:      General: She is not in acute distress.    Appearance: Normal appearance.     Comments: Patient is chronically ill-appearing  HENT:     Head: Normocephalic and atraumatic.     Mouth/Throat:     Mouth: Mucous membranes are dry.     Comments: Very dry mucous membranes Eyes:     General: No scleral icterus.    Conjunctiva/sclera: Conjunctivae normal.  Cardiovascular:     Rate and Rhythm: Tachycardia present.  Pulmonary:     Effort: Pulmonary effort is normal. No respiratory distress.     Breath sounds: No stridor.  Abdominal:     General: Abdomen is flat. There is no distension.     Palpations: Abdomen is soft.     Tenderness: There is no abdominal  tenderness. There is no guarding.  Musculoskeletal:        General: No deformity or signs of injury.     Cervical back: Normal range of motion.  Skin:    General: Skin is dry.     Coloration: Skin is not jaundiced or pale.  Neurological:     Mental Status: She is alert.     Comments: Patient is oriented to person and place but not to time Moves all extremities spontaneously  Psychiatric:        Mood and Affect: Mood normal.        Behavior: Behavior normal.     LABS (all labs ordered are listed, but only abnormal results are displayed)  Labs Reviewed  BASIC METABOLIC PANEL - Abnormal; Notable for the following components:      Result Value   Sodium 134 (*)    Chloride 96 (*)    CO2 21 (*)    Glucose, Bld 109 (*)    BUN 42 (*)    Creatinine, Ser 1.91 (*)    GFR, Estimated 26 (*)    Anion gap 17 (*)    All other components within normal limits  CBC WITH DIFFERENTIAL/PLATELET - Abnormal; Notable for the following components:   WBC 40.7 (*)    RBC 5.77 (*)    MCV 75.6 (*)    MCH 22.7 (*)    RDW 20.9 (*)    Platelets 1,236 (*)    Neutro Abs 35.7 (*)    Monocytes Absolute 1.9 (*)    Basophils Absolute 0.2 (*)    Abs Immature Granulocytes 1.51 (*)    All other components within normal limits   HEPATIC FUNCTION PANEL - Abnormal; Notable for the following components:   Total Bilirubin 1.6 (*)    Indirect Bilirubin 1.5 (*)    All other components within normal limits  RESP PANEL BY RT-PCR (FLU A&B, COVID) ARPGX2  CULTURE, BLOOD (ROUTINE X 2)  CULTURE, BLOOD (ROUTINE X 2)  URINE CULTURE  C DIFFICILE QUICK SCREEN W PCR REFLEX    GASTROINTESTINAL PANEL BY PCR, STOOL (REPLACES STOOL CULTURE)  PATHOLOGIST SMEAR REVIEW  LACTIC ACID, PLASMA  LIPASE, BLOOD  URINALYSIS, ROUTINE W REFLEX MICROSCOPIC  LACTIC ACID, PLASMA  URINALYSIS, COMPLETE (UACMP) WITH MICROSCOPIC   ____________________________________________  EKG Sinus tachycardia, normal axis, normal intervals, ST depression in the lateral precordial leads and inferior leads  ____________________________________________  RADIOLOGY I, Madelin Headings, personally viewed and evaluated these images (plain radiographs) as part of my medical decision making, as well as reviewing the written report by the radiologist.  ED MD interpretation:    I reviewed the CXR which does not show any acute cardiopulmonary process-old rib fractures    ____________________________________________   PROCEDURES  Procedure(s) performed (including Critical Care):  Procedures   ____________________________________________   INITIAL IMPRESSION / ASSESSMENT AND PLAN / ED COURSE   86 year old female presents from her nursing facility with diarrhea confusion failure to thrive.  She is notably tachycardic and borderline hypotensive.  Patient is nontoxic-appearing although she is critically ill and appears very dehydrated.  Her main complaint is multiple episodes of watery diarrhea for the past 3 weeks.  No recent antibiotics and no sick exposures.  Her abdomen is benign.  She notably has a leukocytosis to almost 40 and a thrombocytosis with some AKI as well.  I am suspicious for clustering difficile given leukocytosis and diarrhea.  Will obtain  a CT abdomen pelvis without contrast given her poor  renal function.  We will also give fluids and complete the rest of her sepsis work-up with lactate blood cultures urine and chest x-ray.  Will require admission.  Patient CT of the abdomen shows likely infectious colitis which would be consistent with potential C. difficile infection versus other bacterial infection.  We will cover for presumed C. difficile with oral vancomycin and also cover with ceftriaxone and Flagyl, as patient is meeting sepsis criteria and we have not yet been able to collect a stool sample.        ____________________________________________   FINAL CLINICAL IMPRESSION(S) / ED DIAGNOSES  Final diagnoses:  Colitis     ED Discharge Orders     None        Note:  This document was prepared using Dragon voice recognition software and may include unintentional dictation errors.    Rada Hay, MD 12/11/20 [phone]

## 2020-12-11 NOTE — ED Triage Notes (Signed)
Pt to ED to ED With sister [facility] for failure to thrive for past few days, +diarrhea, c/o back pain. Sister reports she has had increased sleeping.  Sister reports typically disoriented.

## 2020-12-12 DIAGNOSIS — A09 Infectious gastroenteritis and colitis, unspecified: Secondary | ICD-10-CM | POA: Diagnosis not present

## 2020-12-12 LAB — BASIC METABOLIC PANEL
Anion gap: 8 (ref 5–15)
BUN: 43 mg/dL — ABNORMAL HIGH (ref 8–23)
CO2: 23 mmol/L (ref 22–32)
Calcium: 9.1 mg/dL (ref 8.9–10.3)
Chloride: 104 mmol/L (ref 98–111)
Creatinine, Ser: 1.55 mg/dL — ABNORMAL HIGH (ref 0.44–1.00)
GFR, Estimated: 33 mL/min — ABNORMAL LOW (ref >60)
Glucose, Bld: 76 mg/dL (ref 70–99)
Potassium: 3.4 mmol/L — ABNORMAL LOW (ref 3.5–5.1)
Sodium: 135 mmol/L (ref 135–145)

## 2020-12-12 LAB — URINALYSIS, COMPLETE (UACMP) WITH MICROSCOPIC
Glucose, UA: NEGATIVE mg/dL
Hgb urine dipstick: NEGATIVE
Ketones, ur: 40 mg/dL — AB
Leukocytes,Ua: NEGATIVE
Nitrite: NEGATIVE
Protein, ur: 30 mg/dL — AB
Specific Gravity, Urine: >1.03 — ABNORMAL HIGH (ref 1.005–1.030)
pH: 5.5 (ref 5.0–8.0)

## 2020-12-12 LAB — CBC
HCT: 36.7 % (ref 36.0–46.0)
Hemoglobin: 11 g/dL — ABNORMAL LOW (ref 12.0–15.0)
MCH: 22.8 pg — ABNORMAL LOW (ref 26.0–34.0)
MCHC: 30 g/dL (ref 30.0–36.0)
MCV: 76.1 fL — ABNORMAL LOW (ref 80.0–100.0)
Platelets: 818 10*3/uL — ABNORMAL HIGH (ref 150–400)
RBC: 4.82 MIL/uL (ref 3.87–5.11)
RDW: 20.2 % — ABNORMAL HIGH (ref 11.5–15.5)
WBC: 22.5 10*3/uL — ABNORMAL HIGH (ref 4.0–10.5)
nRBC: 0 % (ref 0.0–0.2)

## 2020-12-12 LAB — PROTIME-INR
INR: 1.2 (ref 0.8–1.2)
Prothrombin Time: 14.9 seconds (ref 11.4–15.2)

## 2020-12-12 LAB — CORTISOL-AM, BLOOD: Cortisol - AM: 17.3 ug/dL (ref 6.7–22.6)

## 2020-12-12 LAB — LACTIC ACID, PLASMA: Lactic Acid, Venous: 1 mmol/L (ref 0.5–1.9)

## 2020-12-12 LAB — PROCALCITONIN: Procalcitonin: 0.5 ng/mL

## 2020-12-12 MED ORDER — ENOXAPARIN SODIUM 30 MG/0.3ML IJ SOSY
30.0000 mg | PREFILLED_SYRINGE | INTRAMUSCULAR | Status: DC
  Administered 2020-12-12 – 2020-12-13 (×2): 30 mg via SUBCUTANEOUS
  Filled 2020-12-12 (×2): qty 0.3

## 2020-12-12 MED ORDER — ADULT MULTIVITAMIN W/MINERALS CH
1.0000 | ORAL_TABLET | Freq: Every day | ORAL | Status: DC
  Filled 2020-12-12 (×2): qty 1

## 2020-12-12 MED ORDER — PIPERACILLIN-TAZOBACTAM 3.375 G IVPB
3.3750 g | Freq: Three times a day (TID) | INTRAVENOUS | Status: DC
  Administered 2020-12-12 – 2020-12-13 (×3): 3.375 g via INTRAVENOUS
  Filled 2020-12-12 (×3): qty 50

## 2020-12-12 MED ORDER — BUSPIRONE HCL 15 MG PO TABS
7.5000 mg | ORAL_TABLET | Freq: Three times a day (TID) | ORAL | Status: DC
  Administered 2020-12-12 – 2020-12-14 (×5): 7.5 mg via ORAL
  Filled 2020-12-12 (×8): qty 1

## 2020-12-12 NOTE — Progress Notes (Signed)
PHARMACIST - PHYSICIAN COMMUNICATION  CONCERNING:  Enoxaparin (Lovenox) for DVT Prophylaxis    RECOMMENDATION: Patient was prescribed enoxaprin 40mg  q24 hours for VTE prophylaxis.   Filed Weights   12/11/20 1327  Weight: 48 kg (105 lb 13.1 oz)    Body mass index is 17.61 kg/m.  Estimated Creatinine Clearance: 16.6 mL/min (A) (by C-G formula based on SCr of 1.91 mg/dL (H)).  Patient is candidate for enoxaparin 30mg  every 24 hours based on CrCl <78ml/min or Weight <45kg  DESCRIPTION: Pharmacy has adjusted enoxaparin dose per [facility] policy.  Patient is now receiving enoxaparin 30 mg every 24 hours   Renda Rolls, PharmD, [facility] 12/12/2020 5:35 AM

## 2020-12-12 NOTE — ED Notes (Signed)
Patient currently resting on stretcher in room.  Eyes closed and respirations even.

## 2020-12-12 NOTE — Progress Notes (Signed)
Initial Nutrition Assessment  DOCUMENTATION CODES:   Underweight, Non-severe (moderate) malnutrition in context of chronic illness  INTERVENTION:   -Magic cup TID with meals, each supplement provides 290 kcal and 9 grams of protein  -MVI with minerals daily  NUTRITION DIAGNOSIS:   Moderate Malnutrition related to social / environmental circumstances as evidenced by mild fat depletion, moderate fat depletion, mild muscle depletion, moderate muscle depletion.  GOAL:   Patient will meet greater than or equal to 90% of their needs  MONITOR:   PO intake, Supplement acceptance, Labs, Weight trends, Skin, I & O's  REASON FOR ASSESSMENT:   Malnutrition Screening Tool    ASSESSMENT:   Tina Ingram is a 86 y.o.  Caucasian female with medical history significant for hypertension and tobacco abuse who presents to the emergency room with acute onset of generalized weakness and intractable diarrhea which has been intermittent over the last 3 weeks.  She describes it as watery without blood.  She has associated abdominal cramps as well as nausea and vomiting.  According to her daughter she has been more confused and has not been having much appetite.  She has some dysuria without urinary frequency or urgency or hematuria or flank pain.  No cough or wheezing or dyspnea.  No chest pain or palpitations.  Pt admitted with infectious colitis.   Reviewed I/O's: +1.2 L x 24 hours  Spoke with pt at bedside, who reports feeling better today. She shares that she is hungry as she did not eat for 1-2 days PTA. Per her, report, she consumed 100% of her lunch. Also provided ice cream per her request. Noted meal completion 50-75%.   Pt shares poor appetite for 1 week due to inability to keep foods and liquids down. PTA she consumes 3-4 meals per day (Breakfast: boiled egg and toast; Lunch: half plate of spaghetti or leftovers; Dinner: half plate of spaghetti or green beans and mashed potatoes).    Per  pt, UBW is around 115%. Reviewed wt hx; pt has experienced a 3.8% wt loss over the past 3 months, which is not significant for time frame.   Discussed importance of good meal and supplement intake to promote healing. Pt amenable to [employer].   Medications reviewed and include vitamin D and melatonin.   Labs reviewed: K: 3.4.    NUTRITION - FOCUSED PHYSICAL EXAM:  Flowsheet Row Most Recent Value  Orbital Region Mild depletion  Upper Arm Region Mild depletion  Thoracic and Lumbar Region No depletion  Buccal Region Mild depletion  Temple Region Mild depletion  Clavicle Bone Region Moderate depletion  Clavicle and Acromion Bone Region Moderate depletion  Scapular Bone Region Moderate depletion  Dorsal Hand Mild depletion  Patellar Region Mild depletion  Anterior Thigh Region Mild depletion  Posterior Calf Region Mild depletion  Edema (RD Assessment) None  Hair Reviewed  Eyes Reviewed  Mouth Reviewed  Skin Reviewed  Nails Reviewed       Diet Order:   Diet Order             Diet Heart Room service appropriate? Yes; Fluid consistency: Thin  Diet effective now                   EDUCATION NEEDS:   Education needs have been addressed  Skin:  Skin Assessment: Reviewed RN Assessment  Last BM:  Unknown  Height:   Ht Readings from Last 1 Encounters:  12/11/20 5\' 5"  (1.651 m)    Weight:  Wt Readings from Last 1 Encounters:  12/11/20 48 kg    Ideal Body Weight:  56.8 kg  BMI:  Body mass index is 17.61 kg/m.  Estimated Nutritional Needs:   Kcal:  1450-1650  Protein:  65-80 grams  Fluid:  > 1.4 L    Loistine Chance, RD, LDN, [place] Registered Dietitian II Certified Diabetes Care and Education Specialist Please refer to [facility] for RD and/or RD on-call/weekend/after hours pager

## 2020-12-12 NOTE — ED Notes (Signed)
Patient remains asleep.  Respirations even and unlabored.  Will continue to monitor 

## 2020-12-12 NOTE — Progress Notes (Signed)
[facility] [place] NAME: Tina Ingram    MR#:  [id number]  DATE OF BIRTH:  1934-04-28  SUBJECTIVE:  patient is a poor historian comes in with abdominal cramping and diarrhea. Patient said she is has had over  last three weeks although not sure about it. During my evaluation patient was eating her regular food. She said she was hungry. Denies any abdominal cramping  two sisters at bedside.  Patient has not been able to give any stool sample yet.  REVIEW OF SYSTEMS:   Review of Systems  Constitutional:  Negative for chills, fever and weight loss.  HENT:  Negative for ear discharge, ear pain and nosebleeds.   Eyes:  Negative for blurred vision, pain and discharge.  Respiratory:  Negative for sputum production, shortness of breath, wheezing and stridor.   Cardiovascular:  Negative for chest pain, palpitations, orthopnea and PND.  Gastrointestinal:  Negative for abdominal pain, diarrhea, nausea and vomiting.  Genitourinary:  Negative for frequency and urgency.  Musculoskeletal:  Negative for back pain and joint pain.  Neurological:  Positive for weakness. Negative for sensory change, speech change and focal weakness.  Psychiatric/Behavioral:  Negative for depression and hallucinations. The patient is not nervous/anxious.   Tolerating Diet: Tolerating PT:   DRUG ALLERGIES:  No Known Allergies  VITALS:  Blood pressure 130/71, pulse (!) 101, temperature 97.8 F (36.6 C), temperature source Oral, resp. rate 18, height 5\' 5"  (1.651 m), weight 48 kg, SpO2 96 %.  PHYSICAL EXAMINATION:   Physical Exam  GENERAL:  86 y.o.-year-old patient lying in the bed with no acute distress. thin HEENT: Head atraumatic, normocephalic. Oropharynx and nasopharynx clear.  LUNGS: Normal breath sounds bilaterally, no wheezing, rales, rhonchi. No use of accessory muscles of respiration.  CARDIOVASCULAR: S1, S2 normal. No murmurs, rubs, or gallops.  ABDOMEN:  Soft, nontender, nondistended. Bowel sounds present. No organomegaly or mass.  EXTREMITIES: No cyanosis, clubbing or edema b/l.    NEUROLOGIC: nonfocal PSYCHIATRIC:  patient is alert and oriented x 3.  SKIN: No obvious rash, lesion, or ulcer.   LABORATORY PANEL:  CBC Recent Labs  Lab 12/12/20 0622  WBC 22.5*  HGB 11.0*  HCT 36.7  PLT 818*    Chemistries  Recent Labs  Lab 12/11/20 1329 12/12/20 0622  NA 134* 135  K 4.3 3.4*  CL 96* 104  CO2 21* 23  GLUCOSE 109* 76  BUN 42* 43*  CREATININE 1.91* 1.55*  CALCIUM 10.3 9.1  AST 28  --   ALT 18  --   ALKPHOS 121  --   BILITOT 1.6*  --    Cardiac Enzymes No results for input(s): TROPONINI in the last 168 hours. RADIOLOGY:  CT ABDOMEN PELVIS WO CONTRAST  Result Date: 12/11/2020 CLINICAL DATA:  Acute abdominal pain. Failure to thrive. Diarrhea. Back pain. EXAM: CT ABDOMEN AND PELVIS WITHOUT CONTRAST TECHNIQUE: Multidetector CT imaging of the abdomen and pelvis was performed following the standard protocol without IV contrast. COMPARISON:  06/12/2011 and CT pelvis 09/18/2020 FINDINGS: Lower chest: Old right posterior rib deformities related to old fractures. Left lower lobe density potentially from methacrylate or old granulomatous disease. Nonspecific 5 by 4 mm left lower lobe nodule on image 6 series 3. Substantial aortic and coronary atherosclerotic vascular disease. In calcified left infrahilar lymph nodes. Reduced volume of the left breast compared to the right likely due to prior excision a biopsy/lumpectomy. Hepatobiliary: Suspected small gallstones in the gallbladder on image 42  series 2. Otherwise unremarkable. Pancreas: Unremarkable Spleen: Punctate calcification favoring old granulomatous disease. Upper normal splenic size. Adrenals/Urinary Tract: Hypodense right renal lesions favoring cysts. No hydronephrosis or hydroureter. No urinary tract calculi identified. Stomach/Bowel: There is wall thickening in the sigmoid colon and  rectum both of which are nondistended. Frothy fluid in the rectum, query diarrheal process. Prior postoperative findings along the cecum. No dilated small bowel. Vascular/Lymphatic: Dense atherosclerotic calcification noted this includes the aortoiliac tree and also other systemic arterial vessels including the SMA. No pathologic adenopathy is identified. Reproductive: Speckled calcification in anterior nodularity along the uterine fundus probably from a fibroid. Otherwise unremarkable. Other: No supplemental non-categorized findings. Musculoskeletal: Old healed deformities of the left pubic rami. Old healed deformity of the left sacral ala. Substantial dextroconvex thoracolumbar scoliosis. Transverse irregularity at the S3 level of the sacrum suspicious for prior fracture in this vicinity. This has a slightly more sclerotic appearance than it did on 09/18/2020 and an active stress fracture in this region cannot be excluded. 5 mm of degenerative grade 1 anterolisthesis at L5-S1. 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 Vertebral augmentations associated with compression fractures at T9 and T11. Likely chronic mild superior endplate compression at L2. IMPRESSION: 1. Abnormal wall thickening in the sigmoid colon and rectum is nonspecific for cause although infectious distal colitis would be a top differential diagnostic consideration. There no pneumatosis or other specific indicators of ischemic bowel although there is substantial atherosclerosis. 2. 5 mm left solid pulmonary nodule. No routine follow-up imaging is recommended per Fleischner Society Guidelines. These guidelines do not apply to immunocompromised patients and patients with cancer. Follow up in patients with significant comorbidities as clinically warranted. For lung cancer screening, adhere to Lung-RADS guidelines. Reference: Radiology. 2017; 284(1):228-43. 3. Pelvic fractures as shown on recent CT. There is some mildly increased sclerosis at the  transverse S3 deformity which could indicate active stress fracture of the sacrum. 4. Other imaging findings of potential clinical significance: Aortic Atherosclerosis (ICD10-I70.0). Coronary atherosclerosis. Cholelithiasis. Right renal cysts. Old granulomatous disease. Anterior fundal fibroid in the uterus. Remote vertebral augmentations at T9 and T11 with likely chronic mild superior endplate compression at L2. Electronically Signed   By: Van Clines M.D.   On: 12/11/2020 15:47   DG Chest 2 View  Result Date: 12/11/2020 CLINICAL DATA:  Weakness, shortness of breath EXAM: CHEST - 2 VIEW COMPARISON:  07/05/2020 FINDINGS: Shallow inspiration with low lung volumes. Chronic interstitial changes. Calcified granuloma at the left lung base. Similar cardiomediastinal contours. Osseous demineralization with treated thoracic compression fractures. Chronic right rib fractures. IMPRESSION: No acute process in the chest. Electronically Signed   By: Macy Mis M.D.   On: 12/11/2020 15:30   CT Head Wo Contrast  Result Date: 12/11/2020 CLINICAL DATA:  TIA, failure to thrive for past few days, back pain, diarrhea hypertension, smoker EXAM: CT HEAD WITHOUT CONTRAST TECHNIQUE: Contiguous axial images were obtained from the base of the skull through the vertex without intravenous contrast. COMPARISON:  11/20/2020 FINDINGS: Brain: Generalized atrophy. Normal ventricular morphology. No midline shift or mass effect. Small vessel chronic ischemic changes of deep cerebral white matter. 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 Small old LEFT cerebellar infarct. No intracranial hemorrhage, mass lesion, evidence of acute infarction, or extra-axial fluid collection. Vascular: No hyperdense vessels. Atherosclerotic calcification of internal carotid arteries bilaterally at skull base Skull: Prior RIGHT parietal craniotomy.  Calvaria otherwise intact. Sinuses/Orbits: Partial opacification of RIGHT frontal sinus. Small  mucosal retention cyst LEFT sphenoid sinus. Other:  N/A IMPRESSION: Atrophy with small vessel chronic ischemic changes of deep cerebral white matter. 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 No acute intracranial abnormalities. Electronically Signed   By: Lavonia Dana M.D.   On: 12/11/2020 15:39   ASSESSMENT AND PLAN:  Tina Ingram is a 86 y.o.  Caucasian female with medical history significant for hypertension and tobacco abuse who presents to the emergency room with acute onset of generalized weakness and intractable diarrhea which has been intermittent over the last 3 weeks.  She describes it as watery without blood.  Sepsis secondary to sigmoid colitis -- patient came in with diarrhea on and off for three weeks of nausea and vomiting along with tachycardia elevated white count of 44,000 and abdominal CT scan showing thickening around the sigmoid colon -- patient currently on IV Zosyn -- received dose of oral vancomycin -- patient clinically is improving. White count down to 22,000. -- No fever. -- No diarrhea or stool sample given yet. Am holding oral vancomycin. Awaiting G.I. recommendation for empiric will recommends -- stool studies pending -- G.I. consultation with Dr. Alice Reichert  severe thrombocytopenia is appears reactive -- platelet count coming down  acute on chronic kidney disease stage III B  appears prerenal due to diarrhea and volume loss -- received IV fluids --Came in with creatinine of 1.9-- 1.5 -- baseline creatinine 1.02  history of depression/ anxiety -- continue Buspar   Procedures: Family communication : sisters at bedside Consults : G.I. CODE STATUS: DNR according to the sisters in the ER DVT Prophylaxis : Level of care: Telemetry Medical Status is: Inpatient  Remains inpatient appropriate because: rx for Colitis        TOTAL TIME TAKING CARE OF THIS PATIENT: 25 minutes.  >50% time spent on counselling and coordination of  care  Note: This dictation was prepared with Dragon dictation along with smaller phrase technology. Any transcriptional errors that result from this process are unintentional.  Fritzi Mandes M.D    [facility]   CC: Primary care physician; Derinda Late, MD Patient ID: Tina Ingram, female   DOB: 1934-09-23, 86 y.o.   MRN: [id number]

## 2020-12-12 NOTE — ED Notes (Signed)
Handoff report was given to [employer].

## 2020-12-12 NOTE — Consult Note (Signed)
GI Inpatient Consult Note  Reason for Consult: Colitis presumed infectious    Attending Requesting Consult: Dr. Fritzi Mandes  History of Present Illness: Tina Ingram is a 86 y.o. female seen for evaluation of colitis at the request of hospitalist - Dr. Fritzi Mandes. 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 She presented to the [facility] ED yesterday afternoon from [facility] for chief complaint of severe diarrhea and generalized weakness. Upon presentation to the ED, she was hypertensive at 161/83 and otherwise normal vital signs. Labs were significant for severe leukocytosis (40.7K), hemoglobin 13.1, MCV 75, Plt 1236K, sodium 134, potassium 4.3, BUN 42, serum creatinine 1.91, normal liver enzymes outside of total bilirubin 1.6. COVID-19 and influenza swabs were negative. Blood cultures negative to date. She had CT abd/pelvis without contrast showing abdominal wall thickening in sigmoid colon and rectum. She was given IV Rocephin, Flagyl, and Vancomycin. GI was consulted for further evaluation of colitis.   Patient seen and examined this afternoon resting comfortably in hospital bed. She reports that she has been dealing with severe diarrhea for about 3 weeks described as anywhere from 5-10 watery BMs daily. She denies any gross hematochezia or melena. She has noticed increased fecal urgency and associated lower abdominal cramping. She reports her appetite has been reduced and she hasn't been able to eat like she is used to. She has been confused today. She is asking for her sister. Of note, she did have colonoscopy 07/07/20 performed  as inpatient for indications of melena and symptomatic anemia which showed fair colon prep, normal examined ileum, and normal examined colon with no specimens collected. Per chart review, I see no previous diagnosis of C diff colitis. Patient denies any other acute concerns or complaints at this time.   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   left vertex scalp - MOHS 08/21/2020 ([facility] nasal tip    Problem List: Patient Active Problem List   Diagnosis Date Noted   Infectious colitis 12/11/2020   UTI (urinary tract infection) 09/18/2020   Fall at home, initial encounter 09/18/2020   Generalized weakness 09/18/2020   GERD without esophagitis 09/18/2020   Skin cancer 09/10/2020   Symptomatic anemia    Iron deficiency anemia due to chronic blood loss    Hypokalemia    AKI (acute kidney injury) ([place])    Essential hypertension    Tobacco dependence    GI bleed 07/05/2020    Past Surgical History: Past Surgical History:  Procedure Laterality Date   APPENDECTOMY     BREAST BIOPSY Left 05/16/2014   complex sclerosing lesion    BREAST EXCISIONAL BIOPSY Left 2016   surgical exc to remove complex sclerosing lesion   BREAST LUMPECTOMY WITH RADIOACTIVE SEED LOCALIZATION Left 06/20/2014   Procedure: BREAST LUMPECTOMY WITH RADIOACTIVE SEED LOCALIZATION;  Surgeon: Erroll Luna, MD;  Location: [place];  Service: General;  Laterality: Left;   CAROTID ENDARTERECTOMY Right    CATARACT EXTRACTION W/PHACO Right 06/25/2015   Procedure: CATARACT EXTRACTION PHACO AND INTRAOCULAR LENS PLACEMENT ([place]);  Surgeon: Estill Cotta, MD;  Location: [facility];  Service: Ophthalmology;  Laterality: Right;  [place] 2.01 AP% 24.5 CDE 52.16 Fluid pack lot # [id number]   COLONOSCOPY     COLONOSCOPY WITH PROPOFOL N/A 07/07/2020   Procedure: COLONOSCOPY WITH PROPOFOL;  Surgeon: Lesly Rubenstein, MD;  Location: [facility];  Service: Endoscopy;  Laterality: N/A;   ESOPHAGOGASTRODUODENOSCOPY N/A 07/10/2020   Procedure: ESOPHAGOGASTRODUODENOSCOPY (EGD);   Surgeon: Jonathon Bellows, MD;  Location: [facility] ENDOSCOPY;  Service: Gastroenterology;  Laterality: N/A;   ESOPHAGOGASTRODUODENOSCOPY (EGD) WITH PROPOFOL N/A 07/06/2020   Procedure: ESOPHAGOGASTRODUODENOSCOPY (EGD) WITH PROPOFOL;  Surgeon: [name], MD;  Location: [facility] ENDOSCOPY;  Service: Endoscopy;  Laterality: N/A;   EYE SURGERY     KYPHOPLASTY N/A 01/27/2017   Procedure: [id number];  Surgeon: Hessie Knows, MD;  Location: [facility];  Service: Orthopedics;  Laterality: N/A;  T-9     Allergies: No Known Allergies  Home Medications: Medications Prior to Admission  Medication Sig Dispense Refill Last Dose   acetaminophen (TYLENOL) 325 MG tablet Take 2 tablets (650 mg total) by mouth every 6 (six) hours as needed for mild pain (or Fever >/= 101). (Patient taking differently: Take 650 mg by mouth 3 (three) times daily.)   12/11/2020 at 0800   amLODipine (NORVASC) 2.5 MG tablet Take 2.5 mg by mouth daily.   12/11/2020 at 0800   busPIRone (BUSPAR) 7.5 MG tablet Take 7.5 mg by mouth 3 (three) times daily.   12/11/2020 at 0800   cetirizine (ZYRTEC) 10 MG tablet Take 10 mg by mouth daily.   12/11/2020 at 0800   Cholecalciferol (VITAMIN D) 50 MCG (2000 UT) tablet Take 2,000 Units by mouth daily.   12/11/2020 at 0800   hydrochlorothiazide (MICROZIDE) 12.5 MG capsule Take 12.5 mg by mouth daily.   12/11/2020 at 0800   Multiple Vitamin (MULTIVITAMIN) tablet Take 1 tablet by mouth daily.   12/11/2020 at 0800   pantoprazole (PROTONIX) 40 MG tablet Take 40 mg by mouth daily.   12/11/2020 at 0630   polyethylene glycol (MIRALAX / GLYCOLAX) 17 g packet Take 17 g by mouth daily.   12/11/2020 at 0800   senna-docusate (SENOKOT-S) 8.6-50 MG tablet Take 1 tablet by mouth at bedtime as needed for mild constipation.   prn at unknown   bacitracin ointment Apply topically 2 (two) times daily. 120 g 0 prn at unknown   buPROPion (WELLBUTRIN XL) 300 MG 24 hr tablet Take 300 mg by mouth at bedtime.   12/10/2020 at 2000    diclofenac Sodium (VOLTAREN) 1 % GEL Apply 1 application topically 4 (four) times daily as needed (pain).   prn at unknown   melatonin 3 MG TABS tablet Take 3 mg by mouth at bedtime.   12/10/2020 at 2000   mupirocin ointment (BACTROBAN) 2 %    prn at unknown   traZODone (DESYREL) 50 MG tablet Take 50 mg by mouth at bedtime.   12/10/2020 at [place] medication reconciliation was completed with the patient.   Scheduled Inpatient Medications:    amLODipine  2.5 mg Oral Daily   busPIRone  7.5 mg Oral TID   cholecalciferol  2,000 Units Oral Daily   enoxaparin (LOVENOX) injection  30 mg Subcutaneous Q24H   hydrochlorothiazide  12.5 mg Oral Daily   loratadine  10 mg Oral Daily   melatonin  2.5 mg Oral QHS   [START ON 12/13/2020] multivitamin with minerals  1 tablet Oral Daily   pantoprazole  40 mg Oral Daily   traZODone  50 mg Oral QHS  Continuous Inpatient Infusions:    piperacillin-tazobactam (ZOSYN)  IV      PRN Inpatient Medications:  acetaminophen **OR** acetaminophen, magnesium hydroxide, ondansetron **OR** ondansetron (ZOFRAN) IV  Family History: family history is not on file.  The patient's family history is negative for inflammatory bowel disorders, GI malignancy, or solid organ transplantation.  Social History:   reports that she has been smoking cigarettes. She has been smoking an average of 1 pack per day. She has never used smokeless tobacco. She reports that she does not drink alcohol and does not use drugs. The patient denies ETOH, tobacco, or drug use.   Review of Systems: Constitutional: Weight is stable.  Eyes: No changes in vision. ENT: No oral lesions, sore throat.  GI: see HPI.  Heme/Lymph: No easy bruising.  CV: No chest pain.  GU: No hematuria.  Integumentary: No rashes.  Neuro: No headaches.  Psych: No depression/anxiety.  Endocrine: No heat/cold intolerance.  Allergic/Immunologic: No urticaria.  Resp: No cough, SOB.  Musculoskeletal: No joint  swelling.    Physical Examination: BP 130/71   Pulse (!) 101   Temp 97.8 F (36.6 C) (Oral)   Resp 18   Ht 5\' 5"  (1.651 m)   Wt 48 kg   SpO2 96%   BMI 17.61 kg/m  Pleasantly confused, elderly appearing female in hospital bed. No family bedside. She is able to answer most questions appropriately.  Gen: NAD, alert and oriented x 4 HEENT: PEERLA, EOMI, Neck: supple, no JVD or thyromegaly Chest: CTA bilaterally, no wheezes, crackles, or other adventitious sounds CV: RRR, no m/g/c/r Abd: soft, nondistended, +BS in all four quadrants; nontender to palpation in all four quadrants, no HSM, guarding, ridigity, or rebound tenderness, no fluid wave Ext: no edema, well perfused with 2+ pulses, Skin: no rash or lesions noted Lymph: no LAD  Data: Lab Results  Component Value Date   WBC 22.5 (H) 12/12/2020   HGB 11.0 (L) 12/12/2020   HCT 36.7 12/12/2020   MCV 76.1 (L) 12/12/2020   PLT 818 (H) 12/12/2020   Recent Labs  Lab 12/11/20 1329 12/12/20 0622  HGB 13.1 11.0*   Lab Results  Component Value Date   NA 135 12/12/2020   K 3.4 (L) 12/12/2020   CL 104 12/12/2020   CO2 23 12/12/2020   BUN 43 (H) 12/12/2020   CREATININE 1.55 (H) 12/12/2020   Lab Results  Component Value Date   ALT 18 12/11/2020   AST 28 12/11/2020   ALKPHOS 121 12/11/2020   BILITOT 1.6 (H) 12/11/2020   Recent Labs  Lab 12/12/20 0622  INR 1.2   CT abd/pelvis without contrast 12/11/20: IMPRESSION: 1. Abnormal wall thickening in the sigmoid colon and rectum is nonspecific for cause although infectious distal colitis would be a top differential diagnostic consideration. There no pneumatosis or other specific indicators of ischemic bowel although there is substantial atherosclerosis. 2. 5 mm left solid pulmonary nodule. No routine follow-up imaging is recommended per Fleischner Society Guidelines. These guidelines do not apply to immunocompromised patients and patients with cancer. Follow up in  patients with significant comorbidities as clinically warranted. For lung cancer screening, adhere to Lung-RADS guidelines. Reference: Radiology. 2017; 284(1):228-43. 3. Pelvic fractures as shown on recent CT. There is some mildly increased sclerosis at the transverse S3 deformity which could indicate active stress fracture of the sacrum. 4. Other imaging findings of potential clinical significance: Aortic Atherosclerosis (ICD10-I70.0). Coronary atherosclerosis. Cholelithiasis. Right renal cysts. Old granulomatous disease. Anterior fundal fibroid in the uterus.  Remote vertebral augmentations at T9 and T11 with likely chronic mild superior endplate compression at L2.  Assessment/Plan:  86 y/o Caucasian female with a PMH of HTN and tobacco abuse admitted to the [facility] hospital yesterday for 3-week history of intractable diarrhea and generalized weakness  Colitis, presumed infectious - clinical presentation and imaging support the diagnosis of infectious colitis. Her severe leukocytosis (40K) also supports an infectious colitis. Strongly advise GI PCR and C diff studies to rule out infectious etiologies. Other considerations include inflammatory colitis, ischemic colitis, medication induced colitis, carcinoid/NET, autonomic diarrhea, etc.   Severe leukocytosis (40K) - improved to 20K this morning, 2/2 infectious colitis   Acute on chronic kidney disease  Thrombocytosis - likely reactive, monitoring closely   Recommendations:  - Collect stool studies to rule out infectious colitis. Clinical concern for C diff colitis.  - Continue supportive care with IV fluid hydration, pain control, and antiemetics prn - Follow blood cultures - Continue antibiotics for now - Continue serial abdominal examinations - Recent colonoscopy 06/2020 reassuring with no polyps, colitis, or masses - No indication for repeat luminal evaluation at this time - Continue diet as tolerated for now - Following along with  you   Thank you for the consult. Please call with questions or concerns.  Reeves Forth [facility] [phone] (Cell)

## 2020-12-12 NOTE — ED Notes (Signed)
Patient sleeping comfortable on bed in room.  Respirations even and unlabored.  Will continue to monitor

## 2020-12-12 NOTE — Consult Note (Signed)
Pharmacy Antibiotic Note  Tina Ingram is a 86 y.o. female admitted on 12/11/2020 with colitis. Pharmacy has been consulted for Zosyn dosing.  Plan: Zosyn 3.375 gm IV q8h (4 hour infusion) Monitor clinical picture and renal function F/U C&S, abx deescalation / LOT   Height: 5\' 5"  (165.1 cm) Weight: 48 kg (105 lb 13.1 oz) IBW/kg (Calculated) : 57  Temp (24hrs), Avg:97.9 F (36.6 C), Min:97.5 F (36.4 C), Max:98.1 F (36.7 C)  Recent Labs  Lab 12/11/20 1329 12/11/20 1640 12/12/20 0622  WBC 40.7*  --  22.5*  CREATININE 1.91*  --  1.55*  LATICACIDVEN  --  1.4 1.0    Estimated Creatinine Clearance: 20.5 mL/min (A) (by C-G formula based on SCr of 1.55 mg/dL (H)).    No Known Allergies  Antimicrobials this admission: 11/29 ceftriaxone x 1 in the ED 11/29 PO vanc >> 11/30 11/29 flagyl >> 11/30 11/30 Zosyn >>  Dose adjustments this admission: N/A  Microbiology results: 11/29 BCx: NG<24h   Thank you for allowing pharmacy to be a part of this patient's care.  Darnelle Bos, PharmD 12/12/2020 11:45 AM

## 2020-12-12 NOTE — ED Notes (Signed)
Jun RN aware of assigned bed

## 2020-12-13 DIAGNOSIS — A09 Infectious gastroenteritis and colitis, unspecified: Secondary | ICD-10-CM | POA: Diagnosis not present

## 2020-12-13 DIAGNOSIS — E440 Moderate protein-calorie malnutrition: Secondary | ICD-10-CM | POA: Insufficient documentation

## 2020-12-13 LAB — CBC
HCT: 36.1 % (ref 36.0–46.0)
Hemoglobin: 10.9 g/dL — ABNORMAL LOW (ref 12.0–15.0)
MCH: 23.1 pg — ABNORMAL LOW (ref 26.0–34.0)
MCHC: 30.2 g/dL (ref 30.0–36.0)
MCV: 76.5 fL — ABNORMAL LOW (ref 80.0–100.0)
Platelets: 1051 10*3/uL (ref 150–400)
RBC: 4.72 MIL/uL (ref 3.87–5.11)
RDW: 20.3 % — ABNORMAL HIGH (ref 11.5–15.5)
WBC: 15.4 10*3/uL — ABNORMAL HIGH (ref 4.0–10.5)
nRBC: 0 % (ref 0.0–0.2)

## 2020-12-13 LAB — BASIC METABOLIC PANEL
Anion gap: 9 (ref 5–15)
BUN: 36 mg/dL — ABNORMAL HIGH (ref 8–23)
CO2: 25 mmol/L (ref 22–32)
Calcium: 9.7 mg/dL (ref 8.9–10.3)
Chloride: 104 mmol/L (ref 98–111)
Creatinine, Ser: 1.66 mg/dL — ABNORMAL HIGH (ref 0.44–1.00)
GFR, Estimated: 30 mL/min — ABNORMAL LOW (ref >60)
Glucose, Bld: 154 mg/dL — ABNORMAL HIGH (ref 70–99)
Potassium: 3.3 mmol/L — ABNORMAL LOW (ref 3.5–5.1)
Sodium: 138 mmol/L (ref 135–145)

## 2020-12-13 LAB — IRON AND TIBC
Iron: 17 ug/dL — ABNORMAL LOW (ref 28–170)
Saturation Ratios: 4 % — ABNORMAL LOW (ref 10.4–31.8)
TIBC: 428 ug/dL (ref 250–450)
UIBC: 411 ug/dL

## 2020-12-13 LAB — URINE CULTURE: Culture: NO GROWTH

## 2020-12-13 LAB — FERRITIN: Ferritin: 14 ng/mL (ref 11–307)

## 2020-12-13 MED ORDER — AMOXICILLIN-POT CLAVULANATE 500-125 MG PO TABS
1.0000 | ORAL_TABLET | Freq: Two times a day (BID) | ORAL | Status: DC
  Administered 2020-12-13 – 2020-12-14 (×3): 500 mg via ORAL
  Filled 2020-12-13 (×4): qty 1

## 2020-12-13 MED ORDER — AMOXICILLIN-POT CLAVULANATE 875-125 MG PO TABS: 1.0000 | ORAL_TABLET | Freq: Two times a day (BID) | ORAL | Status: DC

## 2020-12-13 NOTE — Progress Notes (Signed)
GI Inpatient Follow-up Note  Subjective:  Patient seen in follow-up for distal colitis. No acute events overnight. Patient reports 2 episodes of diarrhea this morning. Per nursing report, these episodes could not be contained by brief. Stool studies have not been collected. No repeat labs this morning. She denies any new complaints. She denies fever, nausea, vomiting, or abdominal pain. Physical therapy working with patient this morning.    Scheduled Inpatient Medications:   amLODipine  2.5 mg Oral Daily   busPIRone  7.5 mg Oral TID   cholecalciferol  2,000 Units Oral Daily   enoxaparin (LOVENOX) injection  30 mg Subcutaneous Q24H   hydrochlorothiazide  12.5 mg Oral Daily   loratadine  10 mg Oral Daily   melatonin  2.5 mg Oral QHS   multivitamin with minerals  1 tablet Oral Daily   pantoprazole  40 mg Oral Daily   traZODone  50 mg Oral QHS    Continuous Inpatient Infusions:    piperacillin-tazobactam (ZOSYN)  IV 3.375 g (12/13/20 0434)    PRN Inpatient Medications:  acetaminophen **OR** acetaminophen, magnesium hydroxide, ondansetron **OR** ondansetron (ZOFRAN) IV  Review of Systems: Constitutional: Weight is stable.  Eyes: No changes in vision. ENT: No oral lesions, sore throat.  GI: see HPI.  Heme/Lymph: No easy bruising.  CV: No chest pain.  GU: No hematuria.  Integumentary: No rashes.  Neuro: No headaches.  Psych: No depression/anxiety.  Endocrine: No heat/cold intolerance.  Allergic/Immunologic: No urticaria.  Resp: No cough, SOB.  Musculoskeletal: No joint swelling.    Physical Examination: BP 134/78 (BP Location: Right Arm)   Pulse 88   Temp 97.8 F (36.6 C) (Oral)   Resp 18   Ht 5\' 5"  (1.651 m)   Wt 48 kg   SpO2 97%   BMI 17.61 kg/m  Pleasantly confused, elderly appearing female in hospital bed. No family bedside. She is able to answer most questions appropriately.  Gen: NAD, alert and oriented x 4 HEENT: PEERLA, EOMI, Neck: supple, no JVD or  thyromegaly Chest: CTA bilaterally, no wheezes, crackles, or other adventitious sounds CV: RRR, no m/g/c/r Abd: soft, nondistended, +BS in all four quadrants; nontender to palpation in all four quadrants, no HSM, guarding, ridigity, or rebound tenderness, no fluid wave Ext: no edema, well perfused with 2+ pulses, Skin: no rash or lesions noted Lymph: no LAD  Data: Lab Results  Component Value Date   WBC 22.5 (H) 12/12/2020   HGB 11.0 (L) 12/12/2020   HCT 36.7 12/12/2020   MCV 76.1 (L) 12/12/2020   PLT 818 (H) 12/12/2020   Recent Labs  Lab 12/11/20 1329 12/12/20 0622  HGB 13.1 11.0*   Lab Results  Component Value Date   NA 135 12/12/2020   K 3.4 (L) 12/12/2020   CL 104 12/12/2020   CO2 23 12/12/2020   BUN 43 (H) 12/12/2020   CREATININE 1.55 (H) 12/12/2020   Lab Results  Component Value Date   ALT 18 12/11/2020   AST 28 12/11/2020   ALKPHOS 121 12/11/2020   BILITOT 1.6 (H) 12/11/2020   Recent Labs  Lab 12/12/20 0622  INR 1.2   CT abd/pelvis without contrast 12/11/20: IMPRESSION: 1. Abnormal wall thickening in the sigmoid colon and rectum is nonspecific for cause although infectious distal colitis would be a top differential diagnostic consideration. There no pneumatosis or other specific indicators of ischemic bowel although there is substantial atherosclerosis. 2. 5 mm left solid pulmonary nodule. No routine follow-up imaging is recommended per Fleischner Society Guidelines.  These guidelines do not apply to immunocompromised patients and patients with cancer. Follow up in patients with significant comorbidities as clinically warranted. For lung cancer screening, adhere to Lung-RADS guidelines. Reference: Radiology. 2017; 284(1):228-43. 3. Pelvic fractures as shown on recent CT. There is some mildly increased sclerosis at the transverse S3 deformity which could indicate active stress fracture of the sacrum. 4. Other imaging findings of potential clinical  significance: Aortic Atherosclerosis (ICD10-I70.0). Coronary atherosclerosis. Cholelithiasis. Right renal cysts. Old granulomatous disease. Anterior fundal fibroid in the uterus. Remote vertebral augmentations at T9 and T11 with likely chronic mild superior endplate compression at L2.   Assessment/Plan:   86 y/o Caucasian female with a PMH of HTN and tobacco abuse admitted to the [facility] hospital yesterday for 3-week history of intractable diarrhea and generalized weakness   Colitis, presumed infectious - clinical presentation and imaging support the diagnosis of infectious colitis. No repeat labs this morning. Stool studies not collected yet.    Severe leukocytosis (40K) - improved to 20K this morning, 2/2 infectious colitis    Acute on chronic kidney disease   Thrombocytosis - likely reactive, monitoring closely   Recommendations:  - Please collect stool studies with next bowel movement to rule out infectious etiologies - Repeat CBC and CMP this morning to trend white blood cell count and renal function - Continue supportive care with IV fluid hydration, pain control, and antiemetics prn - Continue antibiotics for now - Continue serial abdominal examinations - No indication for repeat luminal evaluation at this time. If she does not show clinical improvement within 24-48 hours, will consider flex sig with random biopsies - Following along with you   Please call with questions or concerns.    Octavia Bruckner, PA-C [facility] [phone] (Cell)

## 2020-12-13 NOTE — Evaluation (Signed)
Physical Therapy Evaluation Patient Details Name: Tina Ingram MRN: [id number] DOB: [birth year] Today's Date: 12/13/2020  History of Present Illness  Tina Ingram is a 86 y.o.  Caucasian female with medical history significant for hypertension and tobacco abuse who presents to the emergency room with acute onset of generalized weakness and intractable diarrhea which has been intermittent over the last 3 weeks.   Clinical Impression  Pt admitted with above diagnosis. Pt received in recliner attempting to stand up without assistance in room. Once PT arrives to room pt required minimal redirection to return to sitting. Pt A&O x3 except to time but is confused stating she lives at home then requesting to walk to the dining area thinking she is at [facility].  Pt poor historian so relied on pt's sister, Tina Ingram for PLOF, assist with ADL's. Pt's sister reports pt is from [facility] and receives assistance with all ADL's/IADL's but ambulates with a rollator at baseline. To date, pt able to stand and amb with RW with minguard ~40' with intermittent, Mod VC's for sequencing and safe hand placement and PT assist on RW to prevent running into obstacles/walls. Besides poor safety awareness with amb, pt does not display LOB or unsteadiness with RW use but LE's do appear minorly shaky towards the end of amb that pt reports her LE's feel weaker than usual. Pt returned to recliner with safety measures in place and RN aware. All needs within reach. PT to recommend 24/7 supervision with all transfers and mobility due to poor safety awareness. Will benefit from [facility] PT to improve safety with amb with LRAD and improve LE strength for better tolerance for OOB ADL's/activities. Pt currently with functional limitations due to the deficits listed below (see PT Problem List). Pt will benefit from skilled PT to increase their independence and safety with mobility to allow discharge to the venue listed below.       Recommendations for follow up therapy are one component of a multi-disciplinary discharge planning process, led by the attending physician.  Recommendations may be updated based on patient status, additional functional criteria and insurance authorization.  Follow Up Recommendations Home health PT    Assistance Recommended at Discharge Frequent or constant Supervision/Assistance  Functional Status Assessment Patient has had a recent decline in their functional status and demonstrates the ability to make significant improvements in function in a reasonable and predictable amount of time.  Equipment Recommendations  None recommended by PT    Recommendations for Other Services       Precautions / Restrictions Precautions Precautions: Fall Restrictions Weight Bearing Restrictions: No      Mobility  Bed Mobility               General bed mobility comments: Began and ended session in recliner Patient Response: Cooperative;Restless  Transfers Overall transfer level: Needs assistance Equipment used: Rolling walker (2 wheels) Transfers: Sit to/from Stand Sit to Stand: Supervision           General transfer comment: Requires mod VC's for safe hand placement    Ambulation/Gait Ambulation/Gait assistance: Min guard Gait Distance (Feet): 40 Feet Assistive device: Rolling walker (2 wheels) Gait Pattern/deviations: Step-through pattern;Narrow base of support;Trunk flexed       General Gait Details: Requires intermittent hand on RW to assist in navigating hallway and room. Overall good stability with RW.  Stairs            Wheelchair Mobility    Modified Rankin (Stroke Patients Only)  Balance Overall balance assessment: Needs assistance Sitting-balance support: No upper extremity supported;Feet supported Sitting balance-Leahy Scale: Good Sitting balance - Comments: Able to reach outside BOS without LOB   Standing balance support: During functional  activity;Bilateral upper extremity supported Standing balance-Leahy Scale: Fair Standing balance comment: Relies on UE's on RW                             Pertinent Vitals/Pain Pain Assessment: No/denies pain    Home Living Family/patient expects to be discharged to:: Assisted living                 Home Equipment: Rollator (4 wheels)      Prior Function Prior Level of Function : Needs assist               ADLs Comments: Per sister, pt requires assist in dressing and bathing ADL's. Receives meals at assisted living.     Hand Dominance   Dominant Hand: Right    Extremity/Trunk Assessment   Upper Extremity Assessment Upper Extremity Assessment: Generalized weakness;Defer to OT evaluation    Lower Extremity Assessment Lower Extremity Assessment: Generalized weakness    Cervical / Trunk Assessment Cervical / Trunk Assessment: Kyphotic  Communication   Communication: No difficulties  Cognition Arousal/Alertness: Awake/alert Behavior During Therapy: Flat affect;Restless Overall Cognitive Status: History of cognitive impairments - at baseline                                 General Comments: Per sister, pt is typically mildly confused at baseline. Today she is A&Ox3 except to time (believes it's the 1980's). HOwever is confused stating she lives at her home in [place] normally but then [facility] [address] wishing to [place] to the dining area.        General Comments General comments (skin integrity, edema, etc.): HR resting in mid 80's BPM and SPO2 96% on RA    Exercises Other Exercises Other Exercises: Role of PT in acute setting, d/c recs, safe use of DME   Assessment/Plan    PT Assessment Patient needs continued PT services  PT Problem List Decreased strength;Decreased cognition;Decreased activity tolerance;Decreased balance;Decreased mobility;Decreased safety awareness       PT Treatment Interventions DME  instruction;Balance training;Neuromuscular re-education;Gait training;Stair training;Functional mobility training;Patient/family education;Therapeutic activities;Therapeutic exercise    PT Goals (Current goals can be found in the Care Plan section)  Acute Rehab PT Goals PT Goal Formulation: Patient unable to participate in goal setting Time For Goal Achievement: 12/27/20 Potential to Achieve Goals: Fair    Frequency Min 2X/week   Barriers to discharge        Co-evaluation               AM-PAC PT "6 Clicks" Mobility  Outcome Measure Help needed turning from your back to your side while in a flat bed without using bedrails?: A Little Help needed moving from lying on your back to sitting on the side of a flat bed without using bedrails?: A Little Help needed moving to and from a bed to a chair (including a wheelchair)?: A Little Help needed standing up from a chair using your arms (e.g., wheelchair or bedside chair)?: A Little Help needed to walk in hospital room?: A Little Help needed climbing 3-5 steps with a railing? : A Lot 6 Click Score: 17    End  of Session Equipment Utilized During Treatment: Gait belt Activity Tolerance: Patient tolerated treatment well Patient left: in chair;with chair alarm set;with call bell/phone within reach Nurse Communication: Mobility status PT Visit Diagnosis: Unsteadiness on feet (R26.81);Muscle weakness (generalized) (M62.81)    Time: 6060-0459 PT Time Calculation (min) (ACUTE ONLY): 22 min   Charges:   PT Evaluation $PT Eval Moderate Complexity: 1 Mod PT Treatments $Gait Training: 8-22 mins       Salem Caster. Fairly IV, PT, DPT Physical Therapist- [facility]  12/13/2020, 11:15 AM

## 2020-12-13 NOTE — Progress Notes (Addendum)
[place] [facility] [place] NAME: Tina Ingram    MR#:  [id number]  DATE OF BIRTH:  1934/02/08  SUBJECTIVE:  patient is a poor historian comes in with abdominal cramping and diarrhea. Patient said she is has had over  last three weeks although not sure about it. During my evaluation patient was eating her regular food. She said she was hungry. Denies any abdominal cramping  patient had diarrheal stool episode however per dayshift RN stool studies have not yet been sent by night shift RN.  Got out  in the chair. Denies any complaints. Baseline confusion.  REVIEW OF SYSTEMS:   Review of Systems  Constitutional:  Negative for chills, fever and weight loss.  HENT:  Negative for ear discharge, ear pain and nosebleeds.   Eyes:  Negative for blurred vision, pain and discharge.  Respiratory:  Negative for sputum production, shortness of breath, wheezing and stridor.   Cardiovascular:  Negative for chest pain, palpitations, orthopnea and PND.  Gastrointestinal:  Positive for diarrhea. Negative for abdominal pain, nausea and vomiting.  Genitourinary:  Negative for frequency and urgency.  Musculoskeletal:  Negative for back pain and joint pain.  Neurological:  Positive for weakness. Negative for sensory change, speech change and focal weakness.  Psychiatric/Behavioral:  Negative for depression and hallucinations. The patient is not nervous/anxious.   Tolerating Diet:yes Tolerating PT: HHPT  DRUG ALLERGIES:  No Known Allergies  VITALS:  Blood pressure (!) 106/54, pulse 79, temperature 97.6 F (36.4 C), resp. rate 16, height 5\' 5"  (1.651 m), weight 48 kg, SpO2 99 %.  PHYSICAL EXAMINATION:   Physical Exam  GENERAL:  86 y.o.-year-old patient lying in the bed with no acute distress. thin HEENT: Head atraumatic, normocephalic. Oropharynx and nasopharynx clear.  LUNGS: Normal breath sounds bilaterally, no wheezing, rales, rhonchi. No use of accessory  muscles of respiration.  CARDIOVASCULAR: S1, S2 normal. No murmurs, rubs, or gallops.  ABDOMEN: Soft, nontender, nondistended. Bowel sounds present. No organomegaly or mass.  EXTREMITIES: No cyanosis, clubbing or edema b/l.    NEUROLOGIC: nonfocal PSYCHIATRIC:  patient is alert and oriented x 3.  SKIN: No obvious rash, lesion, or ulcer.   LABORATORY PANEL:  CBC Recent Labs  Lab 12/12/20 0622  WBC 22.5*  HGB 11.0*  HCT 36.7  PLT 818*     Chemistries  Recent Labs  Lab 12/11/20 1329 12/12/20 0622  NA 134* 135  K 4.3 3.4*  CL 96* 104  CO2 21* 23  GLUCOSE 109* 76  BUN 42* 43*  CREATININE 1.91* 1.55*  CALCIUM 10.3 9.1  AST 28  --   ALT 18  --   ALKPHOS 121  --   BILITOT 1.6*  --     Cardiac Enzymes No results for input(s): TROPONINI in the last 168 hours. RADIOLOGY:  CT ABDOMEN PELVIS WO CONTRAST  Result Date: 12/11/2020 CLINICAL DATA:  Acute abdominal pain. Failure to thrive. Diarrhea. Back pain. EXAM: CT ABDOMEN AND PELVIS WITHOUT CONTRAST TECHNIQUE: Multidetector CT imaging of the abdomen and pelvis was performed following the standard protocol without IV contrast. COMPARISON:  06/12/2011 and CT pelvis 09/18/2020 FINDINGS: Lower chest: Old right posterior rib deformities related to old fractures. Left lower lobe density potentially from methacrylate or old granulomatous disease. Nonspecific 5 by 4 mm left lower lobe nodule on image 6 series 3. Substantial aortic and coronary atherosclerotic vascular disease. In calcified left infrahilar lymph nodes. Reduced volume of the left breast compared to the right  likely due to prior excision a biopsy/lumpectomy. Hepatobiliary: Suspected small gallstones in the gallbladder on image 42 series 2. Otherwise unremarkable. Pancreas: Unremarkable Spleen: Punctate calcification favoring old granulomatous disease. Upper normal splenic size. Adrenals/Urinary Tract: Hypodense right renal lesions favoring cysts. No hydronephrosis or hydroureter.  No urinary tract calculi identified. Stomach/Bowel: There is wall thickening in the sigmoid colon and rectum both of which are nondistended. Frothy fluid in the rectum, query diarrheal process. Prior postoperative findings along the cecum. No dilated small bowel. Vascular/Lymphatic: Dense atherosclerotic calcification noted this includes the aortoiliac tree and also other systemic arterial vessels including the SMA. No pathologic adenopathy is identified. Reproductive: Speckled calcification in anterior nodularity along the uterine fundus probably from a fibroid. Otherwise unremarkable. Other: No supplemental non-categorized findings. Musculoskeletal: Old healed deformities of the left pubic rami. Old healed deformity of the left sacral ala. Substantial dextroconvex thoracolumbar scoliosis. Transverse irregularity at the S3 level of the sacrum suspicious for prior fracture in this vicinity. This has a slightly more sclerotic appearance than it did on 09/18/2020 and an active stress fracture in this region cannot be excluded. 5 mm of degenerative grade 1 anterolisthesis at L5-S1. 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 Vertebral augmentations associated with compression fractures at T9 and T11. Likely chronic mild superior endplate compression at L2. IMPRESSION: 1. Abnormal wall thickening in the sigmoid colon and rectum is nonspecific for cause although infectious distal colitis would be a top differential diagnostic consideration. There no pneumatosis or other specific indicators of ischemic bowel although there is substantial atherosclerosis. 2. 5 mm left solid pulmonary nodule. No routine follow-up imaging is recommended per Fleischner Society Guidelines. These guidelines do not apply to immunocompromised patients and patients with cancer. Follow up in patients with significant comorbidities as clinically warranted. For lung cancer screening, adhere to Lung-RADS guidelines. Reference: Radiology. 2017;  284(1):228-43. 3. Pelvic fractures as shown on recent CT. There is some mildly increased sclerosis at the transverse S3 deformity which could indicate active stress fracture of the sacrum. 4. Other imaging findings of potential clinical significance: Aortic Atherosclerosis (ICD10-I70.0). Coronary atherosclerosis. Cholelithiasis. Right renal cysts. Old granulomatous disease. Anterior fundal fibroid in the uterus. Remote vertebral augmentations at T9 and T11 with likely chronic mild superior endplate compression at L2. Electronically Signed   By: Van Clines M.D.   On: 12/11/2020 15:47   DG Chest 2 View  Result Date: 12/11/2020 CLINICAL DATA:  Weakness, shortness of breath EXAM: CHEST - 2 VIEW COMPARISON:  07/05/2020 FINDINGS: Shallow inspiration with low lung volumes. Chronic interstitial changes. Calcified granuloma at the left lung base. Similar cardiomediastinal contours. Osseous demineralization with treated thoracic compression fractures. Chronic right rib fractures. IMPRESSION: No acute process in the chest. Electronically Signed   By: Macy Mis M.D.   On: 12/11/2020 15:30   CT Head Wo Contrast  Result Date: 12/11/2020 CLINICAL DATA:  TIA, failure to thrive for past few days, back pain, diarrhea hypertension, smoker EXAM: CT HEAD WITHOUT CONTRAST TECHNIQUE: Contiguous axial images were obtained from the base of the skull through the vertex without intravenous contrast. COMPARISON:  11/20/2020 FINDINGS: Brain: Generalized atrophy. Normal ventricular morphology. No midline shift or mass effect. Small vessel chronic ischemic changes of deep cerebral white matter. 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 Small old LEFT cerebellar infarct. No intracranial hemorrhage, mass lesion, evidence of acute infarction, or extra-axial fluid collection. Vascular: No hyperdense vessels. Atherosclerotic calcification of internal carotid arteries bilaterally at skull base Skull: Prior RIGHT parietal  craniotomy.  Calvaria  otherwise intact. Sinuses/Orbits: Partial opacification of RIGHT frontal sinus. Small mucosal retention cyst LEFT sphenoid sinus. Other: N/A IMPRESSION: Atrophy with small vessel chronic ischemic changes of deep cerebral white matter. 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 No acute intracranial abnormalities. Electronically Signed   By: Lavonia Dana M.D.   On: 12/11/2020 15:39   ASSESSMENT AND PLAN:  Tina Ingram is a 86 y.o.  Caucasian female with medical history significant for hypertension and tobacco abuse who presents to the emergency room with acute onset of generalized weakness and intractable diarrhea which has been intermittent over the last 3 weeks.  She describes it as watery without blood.  Sepsis secondary to sigmoid colitis -- patient came in with diarrhea on and off for three weeks of nausea and vomiting along with tachycardia elevated white count of 44,000 and abdominal CT scan showing thickening around the sigmoid colon -- patient currently on IV Zosyn -- received dose of oral vancomycin -- patient clinically is improving. White count down to 22,000. -- No fever. -- No diarrhea or stool sample given yet. Am holding oral vancomycin. Awaiting G.I. recommendation for empiric will recommends -- stool studies pending -- G.I. consultation with Dr. Alice Reichert appreciated --12/1-- staff has not sent stool studies yet -- patient overall clinically stable. Sepsis resolved acute on chronic kidney disease stage III B  appears prerenal due to diarrhea and volume loss -- received IV fluids --Came in with creatinine of 1.9-- 1.5 -- baseline creatinine 1.02  history of depression/ anxiety -- continue Buspar  Nutrition Status: Nutrition Problem: Moderate Malnutrition Etiology: social / environmental circumstances Signs/Symptoms: mild fat depletion, moderate fat depletion, mild muscle depletion, moderate muscle depletion Interventions:  Magic cup, MVI  Thrombocytosis  -- patient has had elevated platelet count in the past. -- Discussed with Dr. Janese Banks. Will send some labs suggested by her and have her follow-up as outpatient at the cancer center.  Procedures: none Family communication : sister Clarise Cruz on the phone Consults : G.I. CODE STATUS: DNR according to the sisters in the ER DVT Prophylaxis : Level of care: Telemetry Medical Status is: Inpatient  Remains inpatient appropriate because: rx for Colitis anticipate discharge 1 to 2 days back to [place]: 25 minutes.  >50% time spent on counselling and coordination of care  Note: This dictation was prepared with Dragon dictation along with smaller phrase technology. Any transcriptional errors that result from this process are unintentional.  Fritzi Mandes M.D    [facility]   CC: Primary care physician; Derinda Late, MD Patient ID: Yehuda Savannah, female   DOB: May 10, 1934, 86 y.o.   MRN: [id number]

## 2020-12-13 NOTE — Progress Notes (Signed)
Lab called with critical plt of 1051. MD made aware.

## 2020-12-13 NOTE — TOC Initial Note (Signed)
Transition of Care [facility]) - Initial/Assessment Note    Patient Details  Name: Tina Ingram MRN: [id number] Date of Birth: 1934-07-23  Transition of Care [facility]) CM/SW Contact:    Shelbie Hutching, RN Phone Number: 12/13/2020, 3:04 PM  Clinical Narrative:                 Patient admitted to the hospital for infectious colitis and dehydration.  Patient is from [place].  RNCM left a message for the Chancy Hurter, for return call.  Patient walks with a walker, she has been confused and restless here in the hospital.  Plan for discharge back to [facility].  Earlyne Iba with [facility] was going to come and evaluate patient today.    Expected Discharge Plan: Assisted Living Barriers to Discharge: Continued Medical Work up   Patient Goals and CMS Choice Patient states their goals for this hospitalization and ongoing recovery are:: Plan to return to [facility].gov Compare Post Acute Care list provided to:: Patient Represenative (must comment) Choice offered to / list presented to : [facility] POA / Guardian  Expected Discharge Plan and Services Expected Discharge Plan: Assisted Living   Discharge Planning Services: CM Consult Post Acute Care Choice: Resumption of Svcs/PTA Provider Living arrangements for the past 2 months: Assisted Living Facility                 DME Arranged: N/A DME Agency: NA       HH Arranged: NA HH Agency: NA        Prior Living Arrangements/Services Living arrangements for the past 2 months: [place] Lives with:: Facility Resident Patient language and need for interpreter reviewed:: Yes Do you feel safe going back to the place where you live?: Yes      Need for Family Participation in Patient Care: Yes (Comment) Care giver support system in place?: Yes (comment) (grandson and ALF) Current home services: DME, Homehealth aide (rollator and cane) Criminal Activity/Legal Involvement Pertinent to Current  Situation/Hospitalization: No - Comment as needed  Activities of Daily Living Home Assistive Devices/Equipment: Environmental consultant (specify type) ADL Screening (condition at time of admission) Patient's cognitive ability adequate to safely complete daily activities?: No Is the patient deaf or have difficulty hearing?: No Does the patient have difficulty seeing, even when wearing glasses/contacts?: No Does the patient have difficulty concentrating, remembering, or making decisions?: Yes Patient able to express need for assistance with ADLs?: Yes Does the patient have difficulty dressing or bathing?: Yes Independently performs ADLs?: No Does the patient have difficulty walking or climbing stairs?: Yes Weakness of Legs: Both Weakness of Arms/Hands: Both  Permission Sought/Granted Permission sought to share information with : Case Manager, Customer service manager Permission granted to share information with : Yes, Verbal Permission Granted     Permission granted to share info w AGENCY: [place]        Emotional Assessment Appearance:: Appears stated age Attitude/Demeanor/Rapport: Self-Absorbed Affect (typically observed): Restless Orientation: : Oriented to Self Alcohol / Substance Use: Not Applicable Psych Involvement: No (comment)  Admission diagnosis:  Infectious colitis [A09] Colitis [K52.9] Patient Active Problem List   Diagnosis Date Noted   Malnutrition of moderate degree 12/13/2020   Infectious colitis 12/11/2020   UTI (urinary tract infection) 09/18/2020   Fall at home, initial encounter 09/18/2020   Generalized weakness 09/18/2020   GERD without esophagitis 09/18/2020   Skin cancer 09/10/2020   Symptomatic anemia    Iron deficiency anemia due  to chronic blood loss    Hypokalemia    AKI (acute kidney injury) ([place])    Essential hypertension    Tobacco dependence    GI bleed 07/05/2020   PCP:  Derinda Late, MD Pharmacy:   [place], [place] -  [address] [place] [address] Phone: [phone] Fax: [phone]  [id number] - [facility] [place], [place]. AT [address] [place] [address] Phone: [phone] Fax: [phone]     Social Determinants of Health (SDOH) Interventions    Readmission Risk Interventions No flowsheet data found.

## 2020-12-13 NOTE — Progress Notes (Signed)
PHARMACY NOTE:  ANTIMICROBIAL RENAL DOSAGE ADJUSTMENT  Current antimicrobial regimen includes a mismatch between antimicrobial dosage and estimated renal function.  As per policy approved by the Pharmacy & Therapeutics and Medical Executive Committees, the antimicrobial dosage will be adjusted accordingly.  Current antimicrobial dosage:  Augmentin 875/125mg  BID  Indication: colitis  Renal Function:  Estimated Creatinine Clearance: 19.1 mL/min (A) (by C-G formula based on SCr of 1.66 mg/dL (H)). []      On intermittent HD, scheduled: []      On CRRT    Antimicrobial dosage has been changed to:  Augmentin 500/125mg  BID  Additional comments:   Thank you for allowing pharmacy to be a part of this patient's care.  Berta Minor, [facility] 12/13/2020 1:11 PM

## 2020-12-14 DIAGNOSIS — A09 Infectious gastroenteritis and colitis, unspecified: Secondary | ICD-10-CM | POA: Diagnosis not present

## 2020-12-14 LAB — C DIFFICILE QUICK SCREEN W PCR REFLEX
C Diff antigen: POSITIVE — AB
C Diff toxin: NEGATIVE

## 2020-12-14 LAB — CLOSTRIDIUM DIFFICILE BY PCR, REFLEXED: Toxigenic C. Difficile by PCR: POSITIVE — AB

## 2020-12-14 MED ORDER — AMOXICILLIN-POT CLAVULANATE 500-125 MG PO TABS: 1.0000 | ORAL_TABLET | Freq: Two times a day (BID) | ORAL | 0 refills | Status: AC

## 2020-12-14 MED ORDER — POLYSACCHARIDE IRON COMPLEX 150 MG PO CAPS
150.0000 mg | ORAL_CAPSULE | Freq: Every day | ORAL | Status: DC
  Filled 2020-12-14: qty 1

## 2020-12-14 MED ORDER — POLYSACCHARIDE IRON COMPLEX 150 MG PO CAPS: 150.0000 mg | ORAL_CAPSULE | Freq: Every day | ORAL | 3 refills | Status: DC

## 2020-12-14 NOTE — Progress Notes (Signed)
Pt being combative and yelling, confused during pt care, refused to put on a gown, after pt care pt went back to sleep

## 2020-12-14 NOTE — Care Management Important Message (Signed)
Important Message  Patient Details  Name: Tina Ingram MRN: [id number] Date of Birth: 1934-10-16   Medicare Important Message Given:  Yes  Patient is in an isolation room so I called and reviewed her Important Message from Medicare by phone [phone]) and she is agreement with her discharge if that is what the doctor is recommending. I asked if she would like me to send her a copy of the form and she declined. I wished her a speedy recovery and thanked her for her time.    Juliann Pulse A Amberlie Gaillard 12/14/2020, 10:12 AM

## 2020-12-14 NOTE — Discharge Summary (Addendum)
[place] [facility] [place] NAME: Tina Ingram    MR#:  [id number]  DATE OF BIRTH:  December 03, 1934  DATE OF ADMISSION:  12/11/2020 ADMITTING PHYSICIAN: Christel Mormon, MD  DATE OF DISCHARGE: 12/14/2020  PRIMARY CARE PHYSICIAN: Derinda Late, MD    ADMISSION DIAGNOSIS:  Infectious colitis [A09] Colitis [K52.9]  DISCHARGE DIAGNOSIS:  Sigmoid/Rectal Colitis  SECONDARY DIAGNOSIS:   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  Caucasian female with medical history significant for hypertension and tobacco abuse who presents to the emergency room with acute onset of generalized weakness and intractable diarrhea which has been intermittent over the last 3 weeks.  She describes it as watery without blood.   Sepsis secondary to sigmoid colitis -- patient came in with diarrhea on and off for three weeks of nausea and vomiting along with tachycardia elevated white count of 44,000 and abdominal CT scan showing thickening around the sigmoid colon -- patient currently on IV Zosyn -- received dose of oral vancomycin -- patient clinically is improving. White count down to 22,000. -- No fever. -- No diarrhea or stool sample given yet. Am holding oral vancomycin. Awaiting G.I. recommendation for empiric will recommends -- stool studies pending -- G.I. consultation with Dr. Alice Reichert appreciated --12/1-- staff has not sent stool studies yet -- patient overall clinically stable. Sepsis resolved --12/2-- vitals ok. Intermittent confusion per RN. No fever, no explosive diarrhea Change to po augmentin   acute on  chronic kidney disease stage III B  appears prerenal due to diarrhea and volume loss -- received IV fluids --Came in with creatinine of 1.9-- 1.5 -- baseline creatinine 1.02   history of depression/ anxiety -- continue Buspar   Nutrition Status: Nutrition Problem: Moderate Malnutrition Etiology: social / environmental circumstances Signs/Symptoms: mild fat depletion, moderate fat depletion, mild muscle depletion, moderate muscle depletion Interventions: Magic cup, MVI   Thrombocytosis  -- patient has had elevated platelet count in the past. -- Discussed with Dr. Janese Banks. Will send some labs suggested by her and have her follow-up as outpatient at the cancer center.   Procedures: none Family communication : left msg for [employer] sister todyay Consults : G.I. CODE STATUS: DNR according to the sisters in the ER DVT Prophylaxis : Level of care: Telemetry Medical Status is: Inpatient   [facility] today    diet regular--no added salt  CONSULTS OBTAINED:  Treatment Team:  Efrain Sella, MD  DRUG ALLERGIES:  No Known Allergies  DISCHARGE MEDICATIONS:   Allergies as of 12/14/2020   No Known Allergies      Medication List     STOP taking these medications    bacitracin ointment   buPROPion 300 MG 24 hr tablet Commonly known as: WELLBUTRIN XL   mupirocin ointment 2 % Commonly known as: BACTROBAN       TAKE these medications    acetaminophen 325 MG tablet Commonly known as: TYLENOL Take 2 tablets (650 mg total) by mouth every 6 (six) hours as needed for  mild pain (or Fever >/= 101). What changed: when to take this   amLODipine 2.5 MG tablet Commonly known as: NORVASC Take 2.5 mg by mouth daily.   amoxicillin-clavulanate 500-125 MG tablet Commonly known as: AUGMENTIN Take 1 tablet (500 mg total) by mouth every 12 (twelve) hours for 5 days.   busPIRone 7.5 MG tablet Commonly known as: BUSPAR Take 7.5 mg by mouth 3 (three) times daily.    cetirizine 10 MG tablet Commonly known as: ZYRTEC Take 10 mg by mouth daily.   diclofenac Sodium 1 % Gel Commonly known as: VOLTAREN Apply 1 application topically 4 (four) times daily as needed (pain).   hydrochlorothiazide 12.5 MG capsule Commonly known as: MICROZIDE Take 12.5 mg by mouth daily.   iron polysaccharides 150 MG capsule Commonly known as: NIFEREX Take 1 capsule (150 mg total) by mouth daily.   melatonin 3 MG Tabs tablet Take 3 mg by mouth at bedtime.   multivitamin tablet Take 1 tablet by mouth daily.   pantoprazole 40 MG tablet Commonly known as: PROTONIX Take 40 mg by mouth daily.   polyethylene glycol 17 g packet Commonly known as: MIRALAX / GLYCOLAX Take 17 g by mouth daily.   senna-docusate 8.6-50 MG tablet Commonly known as: Senokot-S Take 1 tablet by mouth at bedtime as needed for mild constipation.   traZODone 50 MG tablet Commonly known as: DESYREL Take 50 mg by mouth at bedtime.   Vitamin D 50 MCG (2000 UT) tablet Take 2,000 Units by mouth daily.        If you experience worsening of your admission symptoms, develop shortness of breath, life threatening emergency, suicidal or homicidal thoughts you must seek medical attention immediately by calling 911 or calling your MD immediately  if symptoms less severe.  You Must read complete instructions/literature along with all the possible adverse reactions/side effects for all the Medicines you take and that have been prescribed to you. Take any new Medicines after you have completely understood and accept all the possible adverse reactions/side effects.   Please note  You were cared for by a hospitalist during your hospital stay. If you have any questions about your discharge medications or the care you received while you were in the hospital after you are discharged, you can call the unit and asked to speak with the hospitalist on call if the hospitalist that took care of you is not available.  Once you are discharged, your primary care physician will handle any further medical issues. Please note that NO REFILLS for any discharge medications will be authorized once you are discharged, as it is imperative that you return to your primary care physician (or establish a relationship with a primary care physician if you do not have one) for your aftercare needs so that they can reassess your need for medications and monitor your lab values. Today   SUBJECTIVE    No new issues per RN VITAL SIGNS:  Blood pressure (!) 142/74, pulse 86, temperature 98.1 F (36.7 C), resp. rate 18, height 5\' 5"  (1.651 m), weight 48 kg, SpO2 95 %.  I/O:   Intake/Output Summary (Last 24 hours) at 12/14/2020 0825 Last data filed at 12/13/2020 1500 Gross per 24 hour  Intake 50 ml  Output --  Net 50 ml    PHYSICAL EXAMINATION:  GENERAL:  86 y.o.-year-old patient lying in the bed with no acute distress.thin fraile LUNGS: Normal breath sounds bilaterally, no wheezing, rales,rhonchi or crepitation. No use of accessory muscles of respiration.  CARDIOVASCULAR: S1, S2 normal. No murmurs, rubs, or gallops.  ABDOMEN: Soft, non-tender, non-distended. Bowel sounds present. No organomegaly or mass.  EXTREMITIES: No pedal edema, cyanosis, or clubbing.  NEUROLOGIC: non-focal PSYCHIATRIC:  patient is alert  SKIN: No obvious rash, lesion, or ulcer.   DATA REVIEW:   CBC  Recent Labs  Lab 12/13/20 1155  WBC 15.4*  HGB 10.9*  HCT 36.1  PLT 1,051*    Chemistries  Recent Labs  Lab 12/11/20 1329 12/12/20 0622 12/13/20 1155  NA 134*   < > 138  K 4.3   < > 3.3*  CL 96*   < > 104  CO2 21*   < > 25  GLUCOSE 109*   < > 154*  BUN 42*   < > 36*  CREATININE 1.91*   < > 1.66*  CALCIUM 10.3   < > 9.7  AST 28  --   --   ALT 18  --   --   ALKPHOS 121  --   --   BILITOT 1.6*  --   --    < > = values in this interval not displayed.    Microbiology Results   Recent Results (from the past 240 hour(s))  Resp  Panel by RT-PCR (Flu A&B, Covid) Nasopharyngeal Swab     Status: None   Collection Time: 12/11/20  1:29 PM   Specimen: Nasopharyngeal Swab; Nasopharyngeal(NP) swabs in vial transport medium  Result Value Ref Range Status   SARS Coronavirus 2 by RT PCR NEGATIVE NEGATIVE Final    Comment: (NOTE) SARS-CoV-2 target nucleic acids are NOT DETECTED.  The SARS-CoV-2 RNA is generally detectable in upper respiratory specimens during the acute phase of infection. The lowest concentration of SARS-CoV-2 viral copies this assay can detect is 138 copies/mL. A negative result does not preclude SARS-Cov-2 infection and should not be used as the sole basis for treatment or other patient management decisions. A negative result may occur with  improper specimen collection/handling, submission of specimen other than nasopharyngeal swab, presence of viral mutation(s) within the areas targeted by this assay, and inadequate number of viral copies(<138 copies/mL). A negative result must be combined with clinical observations, patient history, and epidemiological information. The expected result is Negative.  Fact Sheet for Patients:  EntrepreneurPulse.com.au  Fact Sheet for Healthcare Providers:  IncredibleEmployment.be  This test is no t yet approved or cleared by the [place] FDA and  has been authorized for detection and/or diagnosis of SARS-CoV-2 by FDA under an Emergency Use Authorization (EUA). This EUA will remain  in effect (meaning this test can be used) for the duration of the COVID-19 declaration under Section 564(b)(1) of the Act, 21 U.S.C.section 360bbb-3(b)(1), unless the authorization is terminated  or revoked sooner.       Influenza A by PCR NEGATIVE NEGATIVE Final   Influenza B by PCR NEGATIVE NEGATIVE Final    Comment: (NOTE) The Xpert Xpress SARS-CoV-2/FLU/RSV plus assay is intended as an aid in the diagnosis of influenza from Nasopharyngeal  swab specimens and should not be used as a sole basis for treatment. Nasal washings and aspirates are unacceptable for Xpert Xpress SARS-CoV-2/FLU/RSV testing.  Fact Sheet for Patients: EntrepreneurPulse.com.au  Fact Sheet for Healthcare Providers: IncredibleEmployment.be  This test is not yet approved or cleared by the [place] FDA and has been authorized for detection and/or diagnosis of SARS-CoV-2 by FDA under an Emergency Use Authorization (EUA). This EUA will remain in effect (meaning this test can be used) for  the duration of the COVID-19 declaration under Section 564(b)(1) of the Act, 21 U.S.C. section 360bbb-3(b)(1), unless the authorization is terminated or revoked.  Performed at [facility], [address] [place], [place] [address]   Blood culture (routine x 2)     Status: None (Preliminary result)   Collection Time: 12/11/20  4:40 PM   Specimen: BLOOD  Result Value Ref Range Status   Specimen Description BLOOD RIGHT ANTECUBITAL  Final   Special Requests   Final    BOTTLES DRAWN AEROBIC AND ANAEROBIC Blood Culture results may not be optimal due to an inadequate volume of blood received in culture bottles   Culture   Final    NO GROWTH 3 DAYS Performed at [facility], [address] [place], [place] [address]    Report Status PENDING  Incomplete  Blood culture (routine x 2)     Status: None (Preliminary result)   Collection Time: 12/11/20  5:00 PM   Specimen: BLOOD  Result Value Ref Range Status   Specimen Description BLOOD BLOOD RIGHT FOREARM  Final   Special Requests   Final    BOTTLES DRAWN AEROBIC AND ANAEROBIC Blood Culture results may not be optimal due to an inadequate volume of blood received in culture bottles   Culture   Final    NO GROWTH 3 DAYS Performed at [facility], [address], [place], [place] [address]    Report Status PENDING  Incomplete  Urine Culture     Status:  None   Collection Time: 12/12/20  4:20 PM   Specimen: Urine, Random  Result Value Ref Range Status   Specimen Description   Final    URINE, RANDOM Performed at [facility], [address], [place], [place] [address]    Special Requests   Final    NONE Performed at [facility], [address], [place], [place] [address]    Culture   Final    NO GROWTH Performed at [facility], [place] [address] [place], [place] [address]    Report Status 12/13/2020 FINAL  Final    RADIOLOGY:  No results found.   CODE STATUS:     Code Status Orders  (From admission, onward)           Start     Ordered   12/11/20 1829  Limited resuscitation (code)  Continuous       Question Answer Comment  In the event of cardiac or respiratory ARREST: Initiate Code Blue, Call Rapid Response Yes   In the event of cardiac or respiratory ARREST: Perform CPR Yes   In the event of cardiac or respiratory ARREST: Perform Intubation/Mechanical Ventilation No   In the event of cardiac or respiratory ARREST: Use NIPPV/BiPAp only if indicated Yes   In the event of cardiac or respiratory ARREST: Administer ACLS medications if indicated Yes   In the event of cardiac or respiratory ARREST: Perform Defibrillation or Cardioversion if indicated Yes      12/11/20 1829           Code Status History     Date Active Date Inactive Code Status Order ID Comments User Context   09/18/2020 1334 09/23/2020 2104 Partial Code [id number]  Jacqlyn Krauss, MD ED   09/10/2020 1615 09/11/2020 1648 Full Code [id number]  Wallace Going, DO Inpatient   07/05/2020 1507 07/12/2020 1848 Full Code [id number]  Leslee Home, DO ED  TOTAL TIME TAKING CARE OF THIS PATIENT: 40 minutes.    Fritzi Mandes M.D  [facility]    CC: Primary care physician; Derinda Late, MD

## 2020-12-14 NOTE — TOC Transition Note (Signed)
Transition of Care [facility]) - CM/SW Discharge Note   Patient Details  Name: Tina Ingram MRN: [id number] Date of Birth: [birth year]  Transition of [facility]) CM/SW Contact:  Shelbie Hutching, RN Phone Number: 12/14/2020, 10:04 AM   Clinical Narrative:    Patient medically cleared for discharge to [facility] today with home health through [facility].  MD placed resumption ordered for RN, PT and OT.  [place] with [facility] is aware of discharge today.  RNCM attempted to reach family, VM left for Chancy Hurter, for return call.  FL2 and DC summary secure emailed to [employer] at [facility].  Izora Gala will see if they can pick patient up today.     Final next level of care: Assisted Living Barriers to Discharge: Barriers Resolved   Patient Goals and CMS Choice Patient states their goals for this hospitalization and ongoing recovery are:: Plan to return to [facility].gov Compare Post Acute Care list provided to:: Patient Represenative (must comment) Choice offered to / list presented to : [place] / [place]  Discharge Placement              Patient chooses bed at: Other - please specify in the comment section below: [facility]) Patient to be transferred to facility by: [facility] Name of family member notified: Voicemail left for Chancy Hurter for return call Patient and family notified of of transfer: 12/14/20  Discharge Plan and Services   Discharge Planning Services: CM Consult Post Acute Care Choice: Resumption of Svcs/PTA Provider          DME Arranged: N/A DME Agency: NA       HH Arranged: RN, PT, OT HH Agency: [place] Date [place]: 12/14/20 Time [place]: [phone] Representative spoke with at [place]: [place]  Social Determinants of Health ([place]) Interventions     Readmission Risk Interventions No flowsheet data found.

## 2020-12-14 NOTE — Progress Notes (Signed)
Patient discharged to [facility] in stable condition with her two sisters. Discharge instructions reviewed with sisters who will provide packet to staff at [facility].

## 2020-12-14 NOTE — NC FL2 (Signed)
[place] LEVEL OF CARE SCREENING TOOL     IDENTIFICATION  Patient Name: Tina Ingram Birthdate: [birth year] Sex: female Admission Date (Current Location): 12/11/2020  [facility] and [place] Number:  Engineering geologist and Address:  [facility], [address] [place], [place] [address]      Provider Number: [id number]  Attending Physician Name and Address:  Fritzi Mandes, MD  Relative Name and Phone Number:  Shadasia Oldfield [facility]) [phone]    Current Level of Care: Hospital Recommended Level of Care: [place] Prior Approval Number:    Date Approved/Denied:   PASRR Number:    Discharge Plan: Other (Comment) (ALF)    Current Diagnoses: Patient Active Problem List   Diagnosis Date Noted   Malnutrition of moderate degree 12/13/2020   Infectious colitis 12/11/2020   UTI (urinary tract infection) 09/18/2020   Fall at home, initial encounter 09/18/2020   Generalized weakness 09/18/2020   GERD without esophagitis 09/18/2020   Skin cancer 09/10/2020   Symptomatic anemia    Iron deficiency anemia due to chronic blood loss    Hypokalemia    AKI (acute kidney injury) ([place])    Essential hypertension    Tobacco dependence    GI bleed 07/05/2020    Orientation RESPIRATION BLADDER Height & Weight     Self  Normal Incontinent Weight: 48 kg Height:  5\' 5"  (165.1 cm)  BEHAVIORAL SYMPTOMS/MOOD NEUROLOGICAL BOWEL NUTRITION STATUS      Incontinent Diet (heart healthy)  AMBULATORY STATUS COMMUNICATION OF NEEDS Skin   Supervision Verbally Normal                       Personal Care Assistance Level of Assistance  Bathing, Feeding, Dressing Bathing Assistance: Limited assistance Feeding assistance: Limited assistance Dressing Assistance: Limited assistance     Functional Limitations Info  Sight, Hearing, Speech Sight Info: Impaired Hearing Info: Adequate Speech Info: Adequate    SPECIAL CARE FACTORS FREQUENCY                        Contractures Contractures Info: Not present    Additional Factors Info  Code Status, Allergies Code Status Info: Full Allergies Info: NKA           Current Medications (12/14/2020):  This is the current hospital active medication list Current Facility-Administered Medications  Medication Dose Route Frequency Provider Last Rate Last Admin   acetaminophen (TYLENOL) tablet 650 mg  650 mg Oral Q6H PRN Mansy, Jan A, MD       Or   acetaminophen (TYLENOL) suppository 650 mg  650 mg Rectal Q6H PRN Mansy, Jan A, MD       amLODipine (NORVASC) tablet 2.5 mg  2.5 mg Oral Daily Mansy, Jan A, MD   2.5 mg at 12/12/20 0847   amoxicillin-clavulanate (AUGMENTIN) 500-125 MG per tablet 500 mg  1 tablet Oral Q12H Fritzi Mandes, MD   500 mg at 12/14/20 1245   busPIRone (BUSPAR) tablet 7.5 mg  7.5 mg Oral TID Fritzi Mandes, MD   7.5 mg at 12/14/20 8099   cholecalciferol (VITAMIN D) tablet 2,000 Units  2,000 Units Oral Daily Mansy, Jan A, MD   2,000 Units at 12/12/20 1015   enoxaparin (LOVENOX) injection 30 mg  30 mg Subcutaneous Q24H Renda Rolls, RPH   30 mg at 12/13/20 1942   hydrochlorothiazide (HYDRODIURIL) tablet 12.5 mg  12.5 mg Oral Daily Mansy, Arvella Merles, MD  12.5 mg at 12/12/20 1015   iron polysaccharides (NIFEREX) capsule 150 mg  150 mg Oral Daily Fritzi Mandes, MD       loratadine (CLARITIN) tablet 10 mg  10 mg Oral Daily Mansy, Jan A, MD   10 mg at 12/12/20 1015   magnesium hydroxide (MILK OF MAGNESIA) suspension 30 mL  30 mL Oral Daily PRN Mansy, Jan A, MD       melatonin tablet 2.5 mg  2.5 mg Oral QHS Mansy, Jan A, MD   2.5 mg at 12/13/20 1939   multivitamin with minerals tablet 1 tablet  1 tablet Oral Daily Fritzi Mandes, MD       ondansetron [facility]) tablet 4 mg  4 mg Oral Q6H PRN Mansy, Jan A, MD       Or   ondansetron [facility]) injection 4 mg  4 mg Intravenous Q6H PRN Mansy, Jan A, MD   4 mg at 12/12/20 1614   pantoprazole (PROTONIX) EC tablet 40 mg  40 mg Oral Daily  Mansy, Jan A, MD   40 mg at 12/12/20 0847   traZODone (DESYREL) tablet 50 mg  50 mg Oral QHS Mansy, Jan A, MD   50 mg at 12/13/20 1938     Discharge Medications: Medication List       STOP taking these medications     bacitracin ointment    buPROPion 300 MG 24 hr tablet Commonly known as: WELLBUTRIN XL    mupirocin ointment 2 % Commonly known as: BACTROBAN           TAKE these medications     acetaminophen 325 MG tablet Commonly known as: TYLENOL Take 2 tablets (650 mg total) by mouth every 6 (six) hours as needed for mild pain (or Fever >/= 101). What changed: when to take this    amLODipine 2.5 MG tablet Commonly known as: NORVASC Take 2.5 mg by mouth daily.    amoxicillin-clavulanate 500-125 MG tablet Commonly known as: AUGMENTIN Take 1 tablet (500 mg total) by mouth every 12 (twelve) hours for 5 days.    busPIRone 7.5 MG tablet Commonly known as: BUSPAR Take 7.5 mg by mouth 3 (three) times daily.    cetirizine 10 MG tablet Commonly known as: ZYRTEC Take 10 mg by mouth daily.    diclofenac Sodium 1 % Gel Commonly known as: VOLTAREN Apply 1 application topically 4 (four) times daily as needed (pain).    hydrochlorothiazide 12.5 MG capsule Commonly known as: MICROZIDE Take 12.5 mg by mouth daily.    iron polysaccharides 150 MG capsule Commonly known as: NIFEREX Take 1 capsule (150 mg total) by mouth daily.    melatonin 3 MG Tabs tablet Take 3 mg by mouth at bedtime.    multivitamin tablet Take 1 tablet by mouth daily.    pantoprazole 40 MG tablet Commonly known as: PROTONIX Take 40 mg by mouth daily.    polyethylene glycol 17 g packet Commonly known as: MIRALAX / GLYCOLAX Take 17 g by mouth daily.    senna-docusate 8.6-50 MG tablet Commonly known as: Senokot-S Take 1 tablet by mouth at bedtime as needed for mild constipation.    traZODone 50 MG tablet Commonly known as: DESYREL Take 50 mg by mouth at bedtime.    Vitamin D 50 MCG (2000  UT) tablet Take 2,000 Units by mouth daily.    Relevant Imaging Results:  Relevant Lab Results:   Additional Information SS# [id number]  Shelbie Hutching, RN

## 2020-12-14 NOTE — Progress Notes (Signed)
Pt slept all night, no complaints

## 2020-12-14 NOTE — NC FL2 (Signed)
[place] LEVEL OF CARE SCREENING TOOL     IDENTIFICATION  Patient Name: Tina Ingram Birthdate: 1934-12-02 Sex: female Admission Date (Current Location): 12/11/2020  [facility] and [place] Number:  Engineering geologist and Address:  [facility], [address], [place], [place] [address]      Provider Number: [id number]  Attending Physician Name and Address:  Fritzi Mandes, MD  Relative Name and Phone Number:  Harjit Douds [facility]) [phone]    Current Level of Care: Hospital Recommended Level of Care: [place] Prior Approval Number:    Date Approved/Denied:   PASRR Number:    Discharge Plan: Other (Comment) (ALF)    Current Diagnoses: Patient Active Problem List   Diagnosis Date Noted   Malnutrition of moderate degree 12/13/2020   Infectious colitis 12/11/2020   UTI (urinary tract infection) 09/18/2020   Fall at home, initial encounter 09/18/2020   Generalized weakness 09/18/2020   GERD without esophagitis 09/18/2020   Skin cancer 09/10/2020   Symptomatic anemia    Iron deficiency anemia due to chronic blood loss    Hypokalemia    AKI (acute kidney injury) ([place])    Essential hypertension    Tobacco dependence    GI bleed 07/05/2020    Orientation RESPIRATION BLADDER Height & Weight     Self  Normal Incontinent Weight: 48 kg Height:  5\' 5"  (165.1 cm)  BEHAVIORAL SYMPTOMS/MOOD NEUROLOGICAL BOWEL NUTRITION STATUS      Incontinent Diet (Regular- no added salt)  AMBULATORY STATUS COMMUNICATION OF NEEDS Skin   Supervision Verbally Normal                       Personal Care Assistance Level of Assistance  Bathing, Feeding, Dressing Bathing Assistance: Limited assistance Feeding assistance: Limited assistance Dressing Assistance: Limited assistance     Functional Limitations Info  Sight, Hearing, Speech Sight Info: Impaired Hearing Info: Adequate Speech Info: Adequate    SPECIAL CARE FACTORS  FREQUENCY                       Contractures Contractures Info: Not present    Additional Factors Info  Code Status, Allergies Code Status Info: Full Allergies Info: NKA           Current Medications (12/14/2020):  This is the current hospital active medication list Current Facility-Administered Medications  Medication Dose Route Frequency Provider Last Rate Last Admin   acetaminophen (TYLENOL) tablet 650 mg  650 mg Oral Q6H PRN Mansy, Jan A, MD       Or   acetaminophen (TYLENOL) suppository 650 mg  650 mg Rectal Q6H PRN Mansy, Jan A, MD       amLODipine (NORVASC) tablet 2.5 mg  2.5 mg Oral Daily Mansy, Jan A, MD   2.5 mg at 12/12/20 0847   amoxicillin-clavulanate (AUGMENTIN) 500-125 MG per tablet 500 mg  1 tablet Oral Q12H Fritzi Mandes, MD   500 mg at 12/14/20 7096   busPIRone (BUSPAR) tablet 7.5 mg  7.5 mg Oral TID Fritzi Mandes, MD   7.5 mg at 12/14/20 2836   cholecalciferol (VITAMIN D) tablet 2,000 Units  2,000 Units Oral Daily Mansy, Jan A, MD   2,000 Units at 12/12/20 1015   enoxaparin (LOVENOX) injection 30 mg  30 mg Subcutaneous Q24H Renda Rolls, RPH   30 mg at 12/13/20 1942   hydrochlorothiazide (HYDRODIURIL) tablet 12.5 mg  12.5 mg Oral Daily Mansy, Jan  A, MD   12.5 mg at 12/12/20 1015   iron polysaccharides (NIFEREX) capsule 150 mg  150 mg Oral Daily Fritzi Mandes, MD       loratadine (CLARITIN) tablet 10 mg  10 mg Oral Daily Mansy, Jan A, MD   10 mg at 12/12/20 1015   magnesium hydroxide (MILK OF MAGNESIA) suspension 30 mL  30 mL Oral Daily PRN Mansy, Jan A, MD       melatonin tablet 2.5 mg  2.5 mg Oral QHS Mansy, Jan A, MD   2.5 mg at 12/13/20 1939   multivitamin with minerals tablet 1 tablet  1 tablet Oral Daily Fritzi Mandes, MD       ondansetron [facility]) tablet 4 mg  4 mg Oral Q6H PRN Mansy, Jan A, MD       Or   ondansetron [facility]) injection 4 mg  4 mg Intravenous Q6H PRN Mansy, Jan A, MD   4 mg at 12/12/20 1614   pantoprazole (PROTONIX) EC tablet 40 mg  40 mg  Oral Daily Mansy, Jan A, MD   40 mg at 12/12/20 0847   traZODone (DESYREL) tablet 50 mg  50 mg Oral QHS Mansy, Jan A, MD   50 mg at 12/13/20 2334     Discharge Medications: Please see discharge summary for a list of discharge medications.  Relevant Imaging Results:  Relevant Lab Results:   Additional Information SS# [id number]  Shelbie Hutching, RN

## 2020-12-14 NOTE — TOC Transition Note (Signed)
Transition of Care [facility]) - CM/SW Discharge Note   Patient Details  Name: Tina Ingram MRN: [id number] Date of Birth: 02/25/1934  Transition of Care [facility]) CM/SW Contact:  Shelbie Hutching, RN Phone Number: 12/14/2020, 10:30 AM   Clinical Narrative:    Patient's sister Chauncey Fischer is on her way to the hospital to bring the patient clothes and to take the patient back to [facility].     Final next level of care: Assisted Living Barriers to Discharge: Barriers Resolved   Patient Goals and CMS Choice Patient states their goals for this hospitalization and ongoing recovery are:: Plan to return to [email or web address].gov Compare Post Acute Care list provided to:: Patient Represenative (must comment) Choice offered to / list presented to : [place] / [place]  Discharge Placement              Patient chooses bed at: Other - please specify in the comment section below: [facility]) Patient to be transferred to facility by: Peggye Ley Name of family member notified: sister Chauncey Fischer Patient and family notified of of transfer: 12/14/20  Discharge Plan and Services   Discharge Planning Services: CM Consult Post Acute Care Choice: Resumption of Svcs/PTA Provider          DME Arranged: N/A DME Agency: NA       HH Arranged: RN, PT, OT HH Agency: [place] Date [place]: 12/14/20 Time [place]: [phone] Representative spoke with at [place]: [place]  Social Determinants of Health ([place]) Interventions     Readmission Risk Interventions No flowsheet data found.

## 2020-12-15 DIAGNOSIS — R1310 Dysphagia, unspecified: Secondary | ICD-10-CM | POA: Diagnosis not present

## 2020-12-15 DIAGNOSIS — N189 Chronic kidney disease, unspecified: Secondary | ICD-10-CM | POA: Diagnosis not present

## 2020-12-15 DIAGNOSIS — G4700 Insomnia, unspecified: Secondary | ICD-10-CM | POA: Diagnosis not present

## 2020-12-15 DIAGNOSIS — I129 Hypertensive chronic kidney disease with stage 1 through stage 4 chronic kidney disease, or unspecified chronic kidney disease: Secondary | ICD-10-CM | POA: Diagnosis not present

## 2020-12-15 DIAGNOSIS — Z87440 Personal history of urinary (tract) infections: Secondary | ICD-10-CM | POA: Diagnosis not present

## 2020-12-15 DIAGNOSIS — K219 Gastro-esophageal reflux disease without esophagitis: Secondary | ICD-10-CM | POA: Diagnosis not present

## 2020-12-15 DIAGNOSIS — F17210 Nicotine dependence, cigarettes, uncomplicated: Secondary | ICD-10-CM | POA: Diagnosis not present

## 2020-12-15 DIAGNOSIS — S91012D Laceration without foreign body, left ankle, subsequent encounter: Secondary | ICD-10-CM | POA: Diagnosis not present

## 2020-12-15 DIAGNOSIS — Z9181 History of falling: Secondary | ICD-10-CM | POA: Diagnosis not present

## 2020-12-15 DIAGNOSIS — M170 Bilateral primary osteoarthritis of knee: Secondary | ICD-10-CM | POA: Diagnosis not present

## 2020-12-15 DIAGNOSIS — K5900 Constipation, unspecified: Secondary | ICD-10-CM | POA: Diagnosis not present

## 2020-12-15 DIAGNOSIS — C4442 Squamous cell carcinoma of skin of scalp and neck: Secondary | ICD-10-CM | POA: Diagnosis not present

## 2020-12-16 LAB — CULTURE, BLOOD (ROUTINE X 2)
Culture: NO GROWTH
Culture: NO GROWTH

## 2020-12-17 ENCOUNTER — Telehealth: Payer: Self-pay | Admitting: Oncology

## 2020-12-17 ENCOUNTER — Inpatient Hospital Stay: Payer: PPO | Admitting: Nurse Practitioner

## 2020-12-17 ENCOUNTER — Inpatient Hospital Stay: Payer: PPO

## 2020-12-17 NOTE — Telephone Encounter (Signed)
Caregiver called to reschedule pt's appt for today. Wants Wed if he can. Call back at [phone]

## 2020-12-18 DIAGNOSIS — K5900 Constipation, unspecified: Secondary | ICD-10-CM | POA: Diagnosis not present

## 2020-12-18 DIAGNOSIS — F17210 Nicotine dependence, cigarettes, uncomplicated: Secondary | ICD-10-CM | POA: Diagnosis not present

## 2020-12-18 DIAGNOSIS — Z9181 History of falling: Secondary | ICD-10-CM | POA: Diagnosis not present

## 2020-12-18 DIAGNOSIS — M170 Bilateral primary osteoarthritis of knee: Secondary | ICD-10-CM | POA: Diagnosis not present

## 2020-12-18 DIAGNOSIS — S91012D Laceration without foreign body, left ankle, subsequent encounter: Secondary | ICD-10-CM | POA: Diagnosis not present

## 2020-12-18 DIAGNOSIS — G4700 Insomnia, unspecified: Secondary | ICD-10-CM | POA: Diagnosis not present

## 2020-12-18 DIAGNOSIS — N189 Chronic kidney disease, unspecified: Secondary | ICD-10-CM | POA: Diagnosis not present

## 2020-12-18 DIAGNOSIS — Z87440 Personal history of urinary (tract) infections: Secondary | ICD-10-CM | POA: Diagnosis not present

## 2020-12-18 DIAGNOSIS — I129 Hypertensive chronic kidney disease with stage 1 through stage 4 chronic kidney disease, or unspecified chronic kidney disease: Secondary | ICD-10-CM | POA: Diagnosis not present

## 2020-12-18 DIAGNOSIS — C4442 Squamous cell carcinoma of skin of scalp and neck: Secondary | ICD-10-CM | POA: Diagnosis not present

## 2020-12-18 DIAGNOSIS — K219 Gastro-esophageal reflux disease without esophagitis: Secondary | ICD-10-CM | POA: Diagnosis not present

## 2020-12-18 DIAGNOSIS — R1310 Dysphagia, unspecified: Secondary | ICD-10-CM | POA: Diagnosis not present

## 2020-12-19 DIAGNOSIS — G4700 Insomnia, unspecified: Secondary | ICD-10-CM | POA: Diagnosis not present

## 2020-12-19 DIAGNOSIS — M170 Bilateral primary osteoarthritis of knee: Secondary | ICD-10-CM | POA: Diagnosis not present

## 2020-12-19 DIAGNOSIS — S91012D Laceration without foreign body, left ankle, subsequent encounter: Secondary | ICD-10-CM | POA: Diagnosis not present

## 2020-12-20 ENCOUNTER — Other Ambulatory Visit: Payer: Self-pay | Admitting: *Deleted

## 2020-12-20 DIAGNOSIS — E876 Hypokalemia: Secondary | ICD-10-CM

## 2020-12-20 DIAGNOSIS — D649 Anemia, unspecified: Secondary | ICD-10-CM

## 2020-12-27 ENCOUNTER — Other Ambulatory Visit: Payer: Self-pay

## 2020-12-27 ENCOUNTER — Inpatient Hospital Stay: Payer: PPO | Attending: Nurse Practitioner

## 2020-12-27 ENCOUNTER — Inpatient Hospital Stay (HOSPITAL_BASED_OUTPATIENT_CLINIC_OR_DEPARTMENT_OTHER): Payer: PPO | Admitting: Nurse Practitioner

## 2020-12-27 ENCOUNTER — Encounter: Payer: Self-pay | Admitting: Nurse Practitioner

## 2020-12-27 VITALS — BP 148/72 | HR 95 | Temp 99.0°F | Resp 16 | Wt 105.0 lb

## 2020-12-27 DIAGNOSIS — A09 Infectious gastroenteritis and colitis, unspecified: Secondary | ICD-10-CM

## 2020-12-27 DIAGNOSIS — D72829 Elevated white blood cell count, unspecified: Secondary | ICD-10-CM | POA: Diagnosis not present

## 2020-12-27 DIAGNOSIS — K922 Gastrointestinal hemorrhage, unspecified: Secondary | ICD-10-CM

## 2020-12-27 DIAGNOSIS — D75839 Thrombocytosis, unspecified: Secondary | ICD-10-CM | POA: Diagnosis not present

## 2020-12-27 DIAGNOSIS — D500 Iron deficiency anemia secondary to blood loss (chronic): Secondary | ICD-10-CM

## 2020-12-27 DIAGNOSIS — N189 Chronic kidney disease, unspecified: Secondary | ICD-10-CM | POA: Diagnosis not present

## 2020-12-27 DIAGNOSIS — D649 Anemia, unspecified: Secondary | ICD-10-CM

## 2020-12-27 DIAGNOSIS — Z86718 Personal history of other venous thrombosis and embolism: Secondary | ICD-10-CM | POA: Diagnosis not present

## 2020-12-27 LAB — CBC WITH DIFFERENTIAL/PLATELET
Abs Immature Granulocytes: 0.05 10*3/uL (ref 0.00–0.07)
Basophils Absolute: 0.1 10*3/uL (ref 0.0–0.1)
Basophils Relative: 1 %
Eosinophils Absolute: 0.1 10*3/uL (ref 0.0–0.5)
Eosinophils Relative: 1 %
HCT: 37.1 % (ref 36.0–46.0)
Hemoglobin: 11.1 g/dL — ABNORMAL LOW (ref 12.0–15.0)
Immature Granulocytes: 1 %
Lymphocytes Relative: 11 %
Lymphs Abs: 1.1 10*3/uL (ref 0.7–4.0)
MCH: 22.8 pg — ABNORMAL LOW (ref 26.0–34.0)
MCHC: 29.9 g/dL — ABNORMAL LOW (ref 30.0–36.0)
MCV: 76.2 fL — ABNORMAL LOW (ref 80.0–100.0)
Monocytes Absolute: 1 10*3/uL (ref 0.1–1.0)
Monocytes Relative: 10 %
Neutro Abs: 8.2 10*3/uL — ABNORMAL HIGH (ref 1.7–7.7)
Neutrophils Relative %: 76 %
Platelets: 493 10*3/uL — ABNORMAL HIGH (ref 150–400)
RBC: 4.87 MIL/uL (ref 3.87–5.11)
RDW: 18.4 % — ABNORMAL HIGH (ref 11.5–15.5)
WBC: 10.5 10*3/uL (ref 4.0–10.5)
nRBC: 0 % (ref 0.0–0.2)

## 2020-12-27 NOTE — Progress Notes (Signed)
Pt and caregiver from [facility] in for follow up today. Pt has some swelling to left lower leg and a bandage.

## 2020-12-27 NOTE — Progress Notes (Signed)
[place] at [place]. [facility] [address] [place], [place] [address] [phone] (phone) [phone] (fax)  Clinic Day:  12/27/2020  Referring physician: Derinda Late, MD  Chief Complaints: Anemia and Thrombocytosis  History of Presenting Illness: Patient is 86 year old female with past medical history significant for hypertension, current smoker, osteoporosis, history of basal cell carcinoma of the left shoulder, chronic pain, falls, and C. difficile, who was admitted to the hospital in June 2023 with generalized weakness, dark stool for 4 to 6 weeks, shortness of breath with exertion.  Hemoglobin in the ER was 5.4.  She received blood transfusion.  She underwent EGD and colonoscopy which were unremarkable.  Capsule endoscopy on 07/10/2020 did not show any abnormal lesions or evidence of bleeding.  She was initiated on an oral PPI.  She was started on oral iron at discharge.  She currently resides at [facility].  Suffered a recent fall with scalp laceration.  Most recently she was admitted to the hospital in December 2022 and found to have infectious colitis.  As part of work-up she was found to have elevated platelet counts and anemia.  She was referred for further evaluation and management.   Review of Systems  Constitutional:  Positive for fatigue. Negative for appetite change and unexpected weight change.  HENT:   Negative for mouth sores, sore throat and trouble swallowing.   Respiratory:  Negative for chest tightness and shortness of breath.   Cardiovascular:  Negative for leg swelling.  Gastrointestinal:  Negative for abdominal pain, blood in stool, constipation, diarrhea, nausea and vomiting.  Genitourinary:  Negative for bladder incontinence and dysuria.   Musculoskeletal:  Negative for flank pain and neck stiffness.  Skin:  Negative for itching, rash and wound.  Neurological:  Negative for  dizziness, headaches, light-headedness and numbness.  Hematological:  Bruises/bleeds easily.  Psychiatric/Behavioral:  Negative for confusion, depression and sleep disturbance. The patient is not nervous/anxious.     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   left vertex scalp - [facility]/21/2020 ([facility] nasal tip    Past Surgical History:  Procedure Laterality Date   APPENDECTOMY     BREAST BIOPSY Left 05/16/2014   complex sclerosing lesion    BREAST EXCISIONAL BIOPSY Left 2016   surgical exc to remove complex sclerosing lesion   BREAST LUMPECTOMY WITH RADIOACTIVE SEED LOCALIZATION Left 06/20/2014   Procedure: BREAST LUMPECTOMY WITH RADIOACTIVE SEED LOCALIZATION;  Surgeon: Erroll Luna, MD;  Location: [place];  Service: General;  Laterality: Left;   CAROTID ENDARTERECTOMY Right    CATARACT EXTRACTION W/PHACO Right 06/25/2015   Procedure: CATARACT EXTRACTION PHACO AND INTRAOCULAR LENS PLACEMENT ([place]);  Surgeon: Estill Cotta, MD;  Location: [facility];  Service: Ophthalmology;  Laterality: Right;  [place] 2.01 AP% 24.5 CDE 52.16 Fluid pack lot # [id number]   COLONOSCOPY     COLONOSCOPY WITH PROPOFOL N/A 07/07/2020   Procedure: COLONOSCOPY WITH PROPOFOL;  Surgeon: Lesly Rubenstein, MD;  Location: [facility];  Service: Endoscopy;  Laterality: N/A;   ESOPHAGOGASTRODUODENOSCOPY N/A 07/10/2020   Procedure: ESOPHAGOGASTRODUODENOSCOPY (EGD);  Surgeon: Jonathon Bellows, MD;  Location: [place];  Service: Gastroenterology;  Laterality: N/A;   ESOPHAGOGASTRODUODENOSCOPY (EGD) WITH PROPOFOL N/A 07/06/2020   Procedure: ESOPHAGOGASTRODUODENOSCOPY (EGD) WITH PROPOFOL;  Surgeon: [name], MD;  Location: [facility];  Service:  Endoscopy;  Laterality: N/A;   EYE SURGERY     KYPHOPLASTY N/A 01/27/2017   Procedure: [id number];  Surgeon: Hessie Knows, MD;  Location: [facility];  Service: Orthopedics;  Laterality: N/A;  T-9     Family History  Problem Relation Age of Onset   Breast cancer Neg Hx      reports that she has been smoking cigarettes. She has been smoking an average of 1 pack per day. She has never used smokeless tobacco. She reports that she does not drink alcohol and does not use drugs.  No Known Allergies  Current Outpatient Medications  Medication Sig Dispense Refill   acetaminophen (TYLENOL) 325 MG tablet Take 2 tablets (650 mg total) by mouth every 6 (six) hours as needed for mild pain (or Fever >/= 101). (Patient taking differently: Take 650 mg by mouth 3 (three) times daily.)     amLODipine (NORVASC) 2.5 MG tablet Take 2.5 mg by mouth daily.     cetirizine (ZYRTEC) 10 MG tablet Take 10 mg by mouth daily.     Cholecalciferol (VITAMIN D) 50 MCG (2000 UT) tablet Take 2,000 Units by mouth daily.     diclofenac Sodium (VOLTAREN) 1 % GEL Apply 1 application topically 4 (four) times daily as needed (pain).     hydrochlorothiazide (MICROZIDE) 12.5 MG capsule Take 12.5 mg by mouth daily.     iron polysaccharides (NIFEREX) 150 MG capsule Take 1 capsule (150 mg total) by mouth daily. 30 capsule 3   melatonin 3 MG TABS tablet Take 3 mg by mouth at bedtime.     Multiple Vitamin (MULTIVITAMIN) tablet Take 1 tablet by mouth daily.     pantoprazole (PROTONIX) 40 MG tablet Take 40 mg by mouth daily.     senna-docusate (SENOKOT-S) 8.6-50 MG tablet Take 1 tablet by mouth at bedtime as needed for mild constipation.     traZODone (DESYREL) 50 MG tablet Take 50 mg by mouth at bedtime.     No current facility-administered medications for this visit.    Objective:  Blood pressure (!) 148/72, pulse 95, temperature 99 F (37.2 C), temperature source Tympanic, resp. rate 16, weight 105 lb (47.6 kg), SpO2 96 %.   Wt Readings from Last 3 Encounters:  12/27/20 105 lb (47.6 kg)  12/11/20 105 lb 13.1 oz (48 kg)  11/26/20 106 lb (48.1 kg)    Body mass index is 17.47 kg/m.   Physical Exam Constitutional:      Appearance: She is not ill-appearing.  Cardiovascular:     Rate and Rhythm: Normal rate and regular rhythm.  Pulmonary:     Effort: No respiratory distress.  Abdominal:     General: There is no distension.     Tenderness: There is no abdominal tenderness.  Skin:    General: Skin is warm.     Coloration: Skin is pale.  Neurological:     Mental Status: She is alert and oriented to person, place, and time.  Psychiatric:        Mood and Affect: Mood normal.        Behavior: Behavior normal.     CBC Latest Ref Rng & Units 12/27/2020 12/13/2020 12/12/2020  WBC 4.0 - 10.5 K/uL 10.5 15.4(H) 22.5(H)  Hemoglobin 12.0 - 15.0 g/dL 11.1(L) 10.9(L) 11.0(L)  Hematocrit 36.0 - 46.0 %  37.1 36.1 36.7  Platelets 150 - 400 K/uL 493(H) 1,051(HH) 818(H)   CMP Latest Ref Rng & Units 12/13/2020 12/12/2020 12/11/2020  Glucose 70 - 99 mg/dL 154(H) 76 109(H)  BUN 8 - 23 mg/dL 36(H) 43(H) 42(H)  Creatinine 0.44 - 1.00 mg/dL 1.66(H) 1.55(H) 1.91(H)  Sodium 135 - 145 mmol/L 138 135 134(L)  Potassium 3.5 - 5.1 mmol/L 3.3(L) 3.4(L) 4.3  Chloride 98 - 111 mmol/L 104 104 96(L)  CO2 22 - 32 mmol/L 25 23 21(L)  Calcium 8.9 - 10.3 mg/dL 9.7 9.1 10.3  Total Protein 6.5 - 8.1 g/dL - - 7.7  Total Bilirubin 0.3 - 1.2 mg/dL - - 1.6(H)  Alkaline Phos 38 - 126 U/L - - 121  AST 15 - 41 U/L - - 28  ALT 0 - 44 U/L - - 18   No results found for: CEA1 / No results found for: CEA1 No results found for: PSA1 No results found for: OFB510 No results found for: CAN125  No results found for: TOTALPROTELP, ALBUMINELP, A1GS, A2GS, BETS, BETA2SER, GAMS, MSPIKE, SPEI Lab Results  Component Value Date   TIBC 428 12/13/2020   TIBC 484 (H) 07/05/2020   FERRITIN 14 12/13/2020   FERRITIN 186 07/11/2020   FERRITIN 3 (L)  07/05/2020   IRONPCTSAT 4 (L) 12/13/2020   IRONPCTSAT 2 (L) 07/05/2020   No results found for: LDH  CT ABDOMEN PELVIS WO CONTRAST  Result Date: 12/11/2020 CLINICAL DATA:  Acute abdominal pain. Failure to thrive. Diarrhea. Back pain. EXAM: CT ABDOMEN AND PELVIS WITHOUT CONTRAST TECHNIQUE: Multidetector CT imaging of the abdomen and pelvis was performed following the standard protocol without IV contrast. COMPARISON:  06/12/2011 and CT pelvis 09/18/2020 FINDINGS: Lower chest: Old right posterior rib deformities related to old fractures. Left lower lobe density potentially from methacrylate or old granulomatous disease. Nonspecific 5 by 4 mm left lower lobe nodule on image 6 series 3. Substantial aortic and coronary atherosclerotic vascular disease. In calcified left infrahilar lymph nodes. Reduced volume of the left breast compared to the right likely due to prior excision a biopsy/lumpectomy. Hepatobiliary: Suspected small gallstones in the gallbladder on image 42 series 2. Otherwise unremarkable. Pancreas: Unremarkable Spleen: Punctate calcification favoring old granulomatous disease. Upper normal splenic size. Adrenals/Urinary Tract: Hypodense right renal lesions favoring cysts. No hydronephrosis or hydroureter. No urinary tract calculi identified. Stomach/Bowel: There is wall thickening in the sigmoid colon and rectum both of which are nondistended. Frothy fluid in the rectum, query diarrheal process. Prior postoperative findings along the cecum. No dilated small bowel. Vascular/Lymphatic: Dense atherosclerotic calcification noted this includes the aortoiliac tree and also other systemic arterial vessels including the SMA. No pathologic adenopathy is identified. Reproductive: Speckled calcification in anterior nodularity along the uterine fundus probably from a fibroid. Otherwise unremarkable. Other: No supplemental non-categorized findings. Musculoskeletal: Old healed deformities of the left pubic rami.  Old healed deformity of the left sacral ala. Substantial dextroconvex thoracolumbar scoliosis. Transverse irregularity at the S3 level of the sacrum suspicious for prior fracture in this vicinity. This has a slightly more sclerotic appearance than it did on 09/18/2020 and an active stress fracture in this region cannot be excluded. 5 mm of degenerative grade 1 anterolisthesis at L5-S1. 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 Vertebral augmentations associated with compression fractures at T9 and T11. Likely chronic mild superior endplate compression at L2. IMPRESSION: 1. Abnormal wall thickening in the sigmoid colon and rectum is nonspecific for cause although infectious distal colitis would be a top  differential diagnostic consideration. There no pneumatosis or other specific indicators of ischemic bowel although there is substantial atherosclerosis. 2. 5 mm left solid pulmonary nodule. No routine follow-up imaging is recommended per Fleischner Society Guidelines. These guidelines do not apply to immunocompromised patients and patients with cancer. Follow up in patients with significant comorbidities as clinically warranted. For lung cancer screening, adhere to Lung-RADS guidelines. Reference: Radiology. 2017; 284(1):228-43. 3. Pelvic fractures as shown on recent CT. There is some mildly increased sclerosis at the transverse S3 deformity which could indicate active stress fracture of the sacrum. 4. Other imaging findings of potential clinical significance: Aortic Atherosclerosis (ICD10-I70.0). Coronary atherosclerosis. Cholelithiasis. Right renal cysts. Old granulomatous disease. Anterior fundal fibroid in the uterus. Remote vertebral augmentations at T9 and T11 with likely chronic mild superior endplate compression at L2. Electronically Signed   By: Van Clines M.D.   On: 12/11/2020 15:47   DG Chest 2 View  Result Date: 12/11/2020 CLINICAL DATA:  Weakness, shortness of breath EXAM: CHEST - 2 VIEW  COMPARISON:  07/05/2020 FINDINGS: Shallow inspiration with low lung volumes. Chronic interstitial changes. Calcified granuloma at the left lung base. Similar cardiomediastinal contours. Osseous demineralization with treated thoracic compression fractures. Chronic right rib fractures. IMPRESSION: No acute process in the chest. Electronically Signed   By: Macy Mis M.D.   On: 12/11/2020 15:30   CT Head Wo Contrast  Result Date: 12/11/2020 CLINICAL DATA:  TIA, failure to thrive for past few days, back pain, diarrhea hypertension, smoker EXAM: CT HEAD WITHOUT CONTRAST TECHNIQUE: Contiguous axial images were obtained from the base of the skull through the vertex without intravenous contrast. COMPARISON:  11/20/2020 FINDINGS: Brain: Generalized atrophy. Normal ventricular morphology. No midline shift or mass effect. Small vessel chronic ischemic changes of deep cerebral white matter. 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 Small old LEFT cerebellar infarct. No intracranial hemorrhage, mass lesion, evidence of acute infarction, or extra-axial fluid collection. Vascular: No hyperdense vessels. Atherosclerotic calcification of internal carotid arteries bilaterally at skull base Skull: Prior RIGHT parietal craniotomy.  Calvaria otherwise intact. Sinuses/Orbits: Partial opacification of RIGHT frontal sinus. Small mucosal retention cyst LEFT sphenoid sinus. Other: N/A IMPRESSION: Atrophy with small vessel chronic ischemic changes of deep cerebral white matter. 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 No acute intracranial abnormalities. Electronically Signed   By: Lavonia Dana M.D.   On: 12/11/2020 15:39      Assessment & Plan:  Tina Ingram is a 86 y.o. female   Anemia- secondary to gi bleeding. S/p transfusion x 2 for hemoglobin 5.4. Today, hemoglobin 11.1 with associated microcytosis. Iron studies were not drawn today and patient declines re-drawing. I used iron studies from  12/13/20 during admission. ferritin 14, iron saturation 4%. TIBC borderline high. Findings consistent with Iron Deficiency. S/p feraheme x 1 in June 2022.  She is unsure if she is taking oral iron but says she previously wasn't able to tolerate. Plan for feraheme x 2. Risks, benefits, and alternatives reviewed including possible reaction to IV iron.   Thrombocytosis- likely secondary to iron deficiency. 493. Monitor.   Leukocytosis- secondary to infection. Was 40k in hospital, improved to 20,000 prior to discharge. ANC today has improved to 8.2. Monitor.   C Diff- positive for c diff 12/14/20. Managed by GI.   GI Bleeding- chronic intermittent. S/p hospitalization in June 2022. managed by GI. Avoid NSAIDs.   Sepsis- secondary to sigmoid colitis, presumed infectious.   CKD- monitor.   RTC in 8 weeks  for labs (cbc, ferritin, iron studies) and see Dr. Janese Banks, +/- feraheme  I discussed the assessment and treatment plan with the patient.  The patient was provided an opportunity to ask questions and all were answered.  The patient agreed with the plan and demonstrated an understanding of the instructions.  The patient was advised to call back if the symptoms worsen or if the condition fails to improve as anticipated.  Thank you for the opportunity to participate in the care of this very pleasant patient  Tina Rutter, DNP, AGNP-C [place] [facility] [place]. [facility] [phone] (clinic)  CC: Dr. Baldemar Lenis

## 2021-01-01 DIAGNOSIS — G4700 Insomnia, unspecified: Secondary | ICD-10-CM | POA: Diagnosis not present

## 2021-01-01 DIAGNOSIS — R1310 Dysphagia, unspecified: Secondary | ICD-10-CM | POA: Diagnosis not present

## 2021-01-01 DIAGNOSIS — Z9181 History of falling: Secondary | ICD-10-CM | POA: Diagnosis not present

## 2021-01-01 DIAGNOSIS — C4442 Squamous cell carcinoma of skin of scalp and neck: Secondary | ICD-10-CM | POA: Diagnosis not present

## 2021-01-01 DIAGNOSIS — N189 Chronic kidney disease, unspecified: Secondary | ICD-10-CM | POA: Diagnosis not present

## 2021-01-01 DIAGNOSIS — K5900 Constipation, unspecified: Secondary | ICD-10-CM | POA: Diagnosis not present

## 2021-01-01 DIAGNOSIS — S91012D Laceration without foreign body, left ankle, subsequent encounter: Secondary | ICD-10-CM | POA: Diagnosis not present

## 2021-01-01 DIAGNOSIS — Z87440 Personal history of urinary (tract) infections: Secondary | ICD-10-CM | POA: Diagnosis not present

## 2021-01-01 DIAGNOSIS — M170 Bilateral primary osteoarthritis of knee: Secondary | ICD-10-CM | POA: Diagnosis not present

## 2021-01-01 DIAGNOSIS — I129 Hypertensive chronic kidney disease with stage 1 through stage 4 chronic kidney disease, or unspecified chronic kidney disease: Secondary | ICD-10-CM | POA: Diagnosis not present

## 2021-01-01 DIAGNOSIS — K219 Gastro-esophageal reflux disease without esophagitis: Secondary | ICD-10-CM | POA: Diagnosis not present

## 2021-01-01 DIAGNOSIS — F17210 Nicotine dependence, cigarettes, uncomplicated: Secondary | ICD-10-CM | POA: Diagnosis not present

## 2021-01-03 DIAGNOSIS — F411 Generalized anxiety disorder: Secondary | ICD-10-CM | POA: Diagnosis not present

## 2021-01-03 DIAGNOSIS — N1832 Chronic kidney disease, stage 3b: Secondary | ICD-10-CM | POA: Diagnosis not present

## 2021-01-03 DIAGNOSIS — D75839 Thrombocytosis, unspecified: Secondary | ICD-10-CM | POA: Diagnosis not present

## 2021-01-03 DIAGNOSIS — K219 Gastro-esophageal reflux disease without esophagitis: Secondary | ICD-10-CM | POA: Diagnosis not present

## 2021-01-03 DIAGNOSIS — F331 Major depressive disorder, recurrent, moderate: Secondary | ICD-10-CM | POA: Diagnosis not present

## 2021-01-03 DIAGNOSIS — K5150 Left sided colitis without complications: Secondary | ICD-10-CM | POA: Diagnosis not present

## 2021-01-03 DIAGNOSIS — I10 Essential (primary) hypertension: Secondary | ICD-10-CM | POA: Diagnosis not present

## 2021-01-04 DIAGNOSIS — S91012D Laceration without foreign body, left ankle, subsequent encounter: Secondary | ICD-10-CM | POA: Diagnosis not present

## 2021-01-04 DIAGNOSIS — I129 Hypertensive chronic kidney disease with stage 1 through stage 4 chronic kidney disease, or unspecified chronic kidney disease: Secondary | ICD-10-CM | POA: Diagnosis not present

## 2021-01-04 DIAGNOSIS — N189 Chronic kidney disease, unspecified: Secondary | ICD-10-CM | POA: Diagnosis not present

## 2021-01-04 DIAGNOSIS — K219 Gastro-esophageal reflux disease without esophagitis: Secondary | ICD-10-CM | POA: Diagnosis not present

## 2021-01-04 DIAGNOSIS — R1310 Dysphagia, unspecified: Secondary | ICD-10-CM | POA: Diagnosis not present

## 2021-01-04 DIAGNOSIS — G4700 Insomnia, unspecified: Secondary | ICD-10-CM | POA: Diagnosis not present

## 2021-01-04 DIAGNOSIS — Z87440 Personal history of urinary (tract) infections: Secondary | ICD-10-CM | POA: Diagnosis not present

## 2021-01-04 DIAGNOSIS — M170 Bilateral primary osteoarthritis of knee: Secondary | ICD-10-CM | POA: Diagnosis not present

## 2021-01-04 DIAGNOSIS — Z9181 History of falling: Secondary | ICD-10-CM | POA: Diagnosis not present

## 2021-01-04 DIAGNOSIS — K5900 Constipation, unspecified: Secondary | ICD-10-CM | POA: Diagnosis not present

## 2021-01-04 DIAGNOSIS — C4442 Squamous cell carcinoma of skin of scalp and neck: Secondary | ICD-10-CM | POA: Diagnosis not present

## 2021-01-04 DIAGNOSIS — F17210 Nicotine dependence, cigarettes, uncomplicated: Secondary | ICD-10-CM | POA: Diagnosis not present

## 2021-01-09 DIAGNOSIS — I10 Essential (primary) hypertension: Secondary | ICD-10-CM | POA: Diagnosis not present

## 2021-01-09 DIAGNOSIS — M6281 Muscle weakness (generalized): Secondary | ICD-10-CM | POA: Diagnosis not present

## 2021-01-11 ENCOUNTER — Telehealth: Payer: Self-pay | Admitting: Oncology

## 2021-01-11 NOTE — Telephone Encounter (Signed)
Left VM with patient about need to reschedule one of her iron infusions. Left direct extension for her to return phone call.

## 2021-01-15 DIAGNOSIS — R4182 Altered mental status, unspecified: Secondary | ICD-10-CM | POA: Diagnosis not present

## 2021-01-15 DIAGNOSIS — N1832 Chronic kidney disease, stage 3b: Secondary | ICD-10-CM | POA: Diagnosis not present

## 2021-01-15 DIAGNOSIS — Z79899 Other long term (current) drug therapy: Secondary | ICD-10-CM | POA: Diagnosis not present

## 2021-01-15 DIAGNOSIS — N390 Urinary tract infection, site not specified: Secondary | ICD-10-CM | POA: Diagnosis not present

## 2021-01-15 DIAGNOSIS — F411 Generalized anxiety disorder: Secondary | ICD-10-CM | POA: Diagnosis not present

## 2021-01-15 DIAGNOSIS — F331 Major depressive disorder, recurrent, moderate: Secondary | ICD-10-CM | POA: Diagnosis not present

## 2021-01-15 DIAGNOSIS — F5101 Primary insomnia: Secondary | ICD-10-CM | POA: Diagnosis not present

## 2021-01-16 ENCOUNTER — Telehealth: Payer: Self-pay | Admitting: Oncology

## 2021-01-16 DIAGNOSIS — F17210 Nicotine dependence, cigarettes, uncomplicated: Secondary | ICD-10-CM | POA: Diagnosis not present

## 2021-01-16 DIAGNOSIS — Z9181 History of falling: Secondary | ICD-10-CM | POA: Diagnosis not present

## 2021-01-16 DIAGNOSIS — Z87440 Personal history of urinary (tract) infections: Secondary | ICD-10-CM | POA: Diagnosis not present

## 2021-01-16 DIAGNOSIS — S91012D Laceration without foreign body, left ankle, subsequent encounter: Secondary | ICD-10-CM | POA: Diagnosis not present

## 2021-01-16 DIAGNOSIS — N189 Chronic kidney disease, unspecified: Secondary | ICD-10-CM | POA: Diagnosis not present

## 2021-01-16 DIAGNOSIS — C4442 Squamous cell carcinoma of skin of scalp and neck: Secondary | ICD-10-CM | POA: Diagnosis not present

## 2021-01-16 DIAGNOSIS — K5900 Constipation, unspecified: Secondary | ICD-10-CM | POA: Diagnosis not present

## 2021-01-16 DIAGNOSIS — I129 Hypertensive chronic kidney disease with stage 1 through stage 4 chronic kidney disease, or unspecified chronic kidney disease: Secondary | ICD-10-CM | POA: Diagnosis not present

## 2021-01-16 DIAGNOSIS — K219 Gastro-esophageal reflux disease without esophagitis: Secondary | ICD-10-CM | POA: Diagnosis not present

## 2021-01-16 DIAGNOSIS — R1310 Dysphagia, unspecified: Secondary | ICD-10-CM | POA: Diagnosis not present

## 2021-01-16 DIAGNOSIS — G4700 Insomnia, unspecified: Secondary | ICD-10-CM | POA: Diagnosis not present

## 2021-01-16 DIAGNOSIS — M170 Bilateral primary osteoarthritis of knee: Secondary | ICD-10-CM | POA: Diagnosis not present

## 2021-01-16 NOTE — Telephone Encounter (Signed)
Left VM with patient to remind her about her iron infusion scheduled on 01/17/21 @ 2pm.

## 2021-01-17 ENCOUNTER — Inpatient Hospital Stay: Payer: PPO | Attending: Oncology

## 2021-01-18 ENCOUNTER — Ambulatory Visit: Payer: PPO

## 2021-01-21 ENCOUNTER — Ambulatory Visit: Payer: PPO

## 2021-01-21 DIAGNOSIS — D509 Iron deficiency anemia, unspecified: Secondary | ICD-10-CM | POA: Diagnosis not present

## 2021-01-21 DIAGNOSIS — I129 Hypertensive chronic kidney disease with stage 1 through stage 4 chronic kidney disease, or unspecified chronic kidney disease: Secondary | ICD-10-CM | POA: Diagnosis not present

## 2021-01-21 DIAGNOSIS — N1832 Chronic kidney disease, stage 3b: Secondary | ICD-10-CM | POA: Diagnosis not present

## 2021-01-21 DIAGNOSIS — R600 Localized edema: Secondary | ICD-10-CM | POA: Diagnosis not present

## 2021-01-21 DIAGNOSIS — F17210 Nicotine dependence, cigarettes, uncomplicated: Secondary | ICD-10-CM | POA: Diagnosis not present

## 2021-01-22 DIAGNOSIS — R1310 Dysphagia, unspecified: Secondary | ICD-10-CM | POA: Diagnosis not present

## 2021-01-22 DIAGNOSIS — N189 Chronic kidney disease, unspecified: Secondary | ICD-10-CM | POA: Diagnosis not present

## 2021-01-22 DIAGNOSIS — Z9181 History of falling: Secondary | ICD-10-CM | POA: Diagnosis not present

## 2021-01-22 DIAGNOSIS — C4442 Squamous cell carcinoma of skin of scalp and neck: Secondary | ICD-10-CM | POA: Diagnosis not present

## 2021-01-22 DIAGNOSIS — M170 Bilateral primary osteoarthritis of knee: Secondary | ICD-10-CM | POA: Diagnosis not present

## 2021-01-22 DIAGNOSIS — G4700 Insomnia, unspecified: Secondary | ICD-10-CM | POA: Diagnosis not present

## 2021-01-22 DIAGNOSIS — S91012D Laceration without foreign body, left ankle, subsequent encounter: Secondary | ICD-10-CM | POA: Diagnosis not present

## 2021-01-22 DIAGNOSIS — I129 Hypertensive chronic kidney disease with stage 1 through stage 4 chronic kidney disease, or unspecified chronic kidney disease: Secondary | ICD-10-CM | POA: Diagnosis not present

## 2021-01-22 DIAGNOSIS — K219 Gastro-esophageal reflux disease without esophagitis: Secondary | ICD-10-CM | POA: Diagnosis not present

## 2021-01-22 DIAGNOSIS — Z87440 Personal history of urinary (tract) infections: Secondary | ICD-10-CM | POA: Diagnosis not present

## 2021-01-22 DIAGNOSIS — K5900 Constipation, unspecified: Secondary | ICD-10-CM | POA: Diagnosis not present

## 2021-01-22 DIAGNOSIS — F17210 Nicotine dependence, cigarettes, uncomplicated: Secondary | ICD-10-CM | POA: Diagnosis not present

## 2021-01-25 ENCOUNTER — Other Ambulatory Visit: Payer: Self-pay

## 2021-01-25 ENCOUNTER — Inpatient Hospital Stay: Payer: PPO | Attending: Oncology

## 2021-01-25 VITALS — BP 124/61 | HR 97 | Resp 18

## 2021-01-25 DIAGNOSIS — D500 Iron deficiency anemia secondary to blood loss (chronic): Secondary | ICD-10-CM | POA: Diagnosis not present

## 2021-01-25 DIAGNOSIS — K922 Gastrointestinal hemorrhage, unspecified: Secondary | ICD-10-CM | POA: Insufficient documentation

## 2021-01-25 MED ORDER — SODIUM CHLORIDE 0.9 % IV SOLN
INTRAVENOUS | Status: DC
  Filled 2021-01-25: qty 250

## 2021-01-25 MED ORDER — SODIUM CHLORIDE 0.9 % IV SOLN
510.0000 mg | Freq: Once | INTRAVENOUS | Status: AC
  Administered 2021-01-25: 510 mg via INTRAVENOUS
  Filled 2021-01-25: qty 17

## 2021-01-25 NOTE — Patient Instructions (Addendum)
[facility] AT [place]  Discharge Instructions: Thank you for choosing [facility] to provide your oncology and hematology care.  If you have a lab appointment with the [facility], please go directly to the [facility] and check in at the registration area.  Wear comfortable clothing and clothing appropriate for easy access to any Portacath or PICC line.   We strive to give you quality time with your provider. You may need to reschedule your appointment if you arrive late (15 or more minutes).  Arriving late affects you and other patients whose appointments are after yours.  Also, if you miss three or more appointments without notifying the office, you may be dismissed from the clinic at the providers discretion.      For prescription refill requests, have your pharmacy contact our office and allow 72 hours for refills to be completed.    Today you received the following : Feraheme   To help prevent nausea and vomiting after your treatment, we encourage you to take your nausea medication as directed.  BELOW ARE SYMPTOMS THAT SHOULD BE REPORTED IMMEDIATELY: *FEVER GREATER THAN 100.4 F (38 C) OR HIGHER *CHILLS OR SWEATING *NAUSEA AND VOMITING THAT IS NOT CONTROLLED WITH YOUR NAUSEA MEDICATION *UNUSUAL SHORTNESS OF BREATH *UNUSUAL BRUISING OR BLEEDING *URINARY PROBLEMS (pain or burning when urinating, or frequent urination) *BOWEL PROBLEMS (unusual diarrhea, constipation, pain near the anus) TENDERNESS IN MOUTH AND THROAT WITH OR WITHOUT PRESENCE OF ULCERS (sore throat, sores in mouth, or a toothache) UNUSUAL RASH, SWELLING OR PAIN  UNUSUAL VAGINAL DISCHARGE OR ITCHING   Items with * indicate a potential emergency and should be followed up as soon as possible or go to the Emergency Department if any problems should occur.  Please show the CHEMOTHERAPY ALERT CARD or IMMUNOTHERAPY ALERT CARD at check-in to the Emergency Department and triage  nurse.  Should you have questions after your visit or need to cancel or reschedule your appointment, please contact [facility] [place] AT [place]  [phone] and follow the prompts.  Office hours are 8:00 a.m. to 4:30 p.m. Monday - Friday. Please note that voicemails left after 4:00 p.m. may not be returned until the following business day.  We are closed weekends and major holidays. You have access to a nurse at all times for urgent questions. Please call the main number to the clinic [phone] and follow the prompts.  For any non-urgent questions, you may also contact your provider using MyChart. We now offer e-Visits for anyone 11 and older to request care online for non-urgent symptoms. For details visit [email or web address].   Also download the MyChart app! Go to the app store, search "MyChart", open the app, select [facility], and log in with your MyChart username and password.  Due to Covid, a mask is required upon entering the hospital/clinic. If you do not have a mask, one will be given to you upon arrival. For doctor visits, patients may have 1 support person aged 11 or older with them. For treatment visits, patients cannot have anyone with them due to current Covid guidelines and our immunocompromised population.

## 2021-01-29 DIAGNOSIS — K5900 Constipation, unspecified: Secondary | ICD-10-CM | POA: Diagnosis not present

## 2021-01-29 DIAGNOSIS — F411 Generalized anxiety disorder: Secondary | ICD-10-CM | POA: Diagnosis not present

## 2021-01-29 DIAGNOSIS — C4442 Squamous cell carcinoma of skin of scalp and neck: Secondary | ICD-10-CM | POA: Diagnosis not present

## 2021-01-29 DIAGNOSIS — S91012D Laceration without foreign body, left ankle, subsequent encounter: Secondary | ICD-10-CM | POA: Diagnosis not present

## 2021-01-29 DIAGNOSIS — Z87440 Personal history of urinary (tract) infections: Secondary | ICD-10-CM | POA: Diagnosis not present

## 2021-01-29 DIAGNOSIS — G894 Chronic pain syndrome: Secondary | ICD-10-CM | POA: Diagnosis not present

## 2021-01-29 DIAGNOSIS — Z9181 History of falling: Secondary | ICD-10-CM | POA: Diagnosis not present

## 2021-01-29 DIAGNOSIS — F17210 Nicotine dependence, cigarettes, uncomplicated: Secondary | ICD-10-CM | POA: Diagnosis not present

## 2021-01-29 DIAGNOSIS — N189 Chronic kidney disease, unspecified: Secondary | ICD-10-CM | POA: Diagnosis not present

## 2021-01-29 DIAGNOSIS — N1832 Chronic kidney disease, stage 3b: Secondary | ICD-10-CM | POA: Diagnosis not present

## 2021-01-29 DIAGNOSIS — K219 Gastro-esophageal reflux disease without esophagitis: Secondary | ICD-10-CM | POA: Diagnosis not present

## 2021-01-29 DIAGNOSIS — R1310 Dysphagia, unspecified: Secondary | ICD-10-CM | POA: Diagnosis not present

## 2021-01-29 DIAGNOSIS — M170 Bilateral primary osteoarthritis of knee: Secondary | ICD-10-CM | POA: Diagnosis not present

## 2021-01-29 DIAGNOSIS — F331 Major depressive disorder, recurrent, moderate: Secondary | ICD-10-CM | POA: Diagnosis not present

## 2021-01-29 DIAGNOSIS — D75839 Thrombocytosis, unspecified: Secondary | ICD-10-CM | POA: Diagnosis not present

## 2021-01-29 DIAGNOSIS — G4700 Insomnia, unspecified: Secondary | ICD-10-CM | POA: Diagnosis not present

## 2021-01-29 DIAGNOSIS — I129 Hypertensive chronic kidney disease with stage 1 through stage 4 chronic kidney disease, or unspecified chronic kidney disease: Secondary | ICD-10-CM | POA: Diagnosis not present
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
## 2021-02-05 ENCOUNTER — Encounter: Payer: Self-pay | Admitting: Oncology

## 2021-02-05 DIAGNOSIS — G4700 Insomnia, unspecified: Secondary | ICD-10-CM | POA: Diagnosis not present

## 2021-02-05 DIAGNOSIS — R262 Difficulty in walking, not elsewhere classified: Secondary | ICD-10-CM | POA: Diagnosis not present

## 2021-02-05 DIAGNOSIS — I129 Hypertensive chronic kidney disease with stage 1 through stage 4 chronic kidney disease, or unspecified chronic kidney disease: Secondary | ICD-10-CM | POA: Diagnosis not present

## 2021-02-05 DIAGNOSIS — R1310 Dysphagia, unspecified: Secondary | ICD-10-CM | POA: Diagnosis not present

## 2021-02-05 DIAGNOSIS — C4442 Squamous cell carcinoma of skin of scalp and neck: Secondary | ICD-10-CM | POA: Diagnosis not present

## 2021-02-05 DIAGNOSIS — N189 Chronic kidney disease, unspecified: Secondary | ICD-10-CM | POA: Diagnosis not present

## 2021-02-05 DIAGNOSIS — M170 Bilateral primary osteoarthritis of knee: Secondary | ICD-10-CM | POA: Diagnosis not present

## 2021-02-05 DIAGNOSIS — K5900 Constipation, unspecified: Secondary | ICD-10-CM | POA: Diagnosis not present

## 2021-02-05 DIAGNOSIS — F17210 Nicotine dependence, cigarettes, uncomplicated: Secondary | ICD-10-CM | POA: Diagnosis not present

## 2021-02-05 DIAGNOSIS — Z87440 Personal history of urinary (tract) infections: Secondary | ICD-10-CM | POA: Diagnosis not present

## 2021-02-05 DIAGNOSIS — A419 Sepsis, unspecified organism: Secondary | ICD-10-CM | POA: Diagnosis not present

## 2021-02-05 DIAGNOSIS — K219 Gastro-esophageal reflux disease without esophagitis: Secondary | ICD-10-CM | POA: Diagnosis not present

## 2021-02-05 DIAGNOSIS — F32A Depression, unspecified: Secondary | ICD-10-CM | POA: Diagnosis not present

## 2021-02-05 DIAGNOSIS — Z9181 History of falling: Secondary | ICD-10-CM | POA: Diagnosis not present

## 2021-02-05 DIAGNOSIS — F419 Anxiety disorder, unspecified: Secondary | ICD-10-CM | POA: Diagnosis not present

## 2021-02-07 ENCOUNTER — Other Ambulatory Visit: Payer: Self-pay

## 2021-02-07 ENCOUNTER — Inpatient Hospital Stay: Payer: PPO | Attending: Oncology

## 2021-02-07 VITALS — BP 127/69 | HR 87 | Temp 97.0°F | Resp 18

## 2021-02-07 DIAGNOSIS — D500 Iron deficiency anemia secondary to blood loss (chronic): Secondary | ICD-10-CM | POA: Insufficient documentation

## 2021-02-07 DIAGNOSIS — K922 Gastrointestinal hemorrhage, unspecified: Secondary | ICD-10-CM | POA: Diagnosis not present

## 2021-02-07 MED ORDER — SODIUM CHLORIDE 0.9 % IV SOLN
Freq: Once | INTRAVENOUS | Status: AC
  Filled 2021-02-07: qty 250

## 2021-02-07 MED ORDER — SODIUM CHLORIDE 0.9 % IV SOLN
510.0000 mg | Freq: Once | INTRAVENOUS | Status: AC
  Administered 2021-02-07: 510 mg via INTRAVENOUS
  Filled 2021-02-07: qty 510

## 2021-02-07 NOTE — Patient Instructions (Signed)

## 2021-02-09 DIAGNOSIS — I10 Essential (primary) hypertension: Secondary | ICD-10-CM | POA: Diagnosis not present

## 2021-02-09 DIAGNOSIS — M6281 Muscle weakness (generalized): Secondary | ICD-10-CM | POA: Diagnosis not present

## 2021-02-12 DIAGNOSIS — F411 Generalized anxiety disorder: Secondary | ICD-10-CM | POA: Diagnosis not present

## 2021-02-12 DIAGNOSIS — I10 Essential (primary) hypertension: Secondary | ICD-10-CM | POA: Diagnosis not present

## 2021-02-12 DIAGNOSIS — G894 Chronic pain syndrome: Secondary | ICD-10-CM | POA: Diagnosis not present

## 2021-02-12 DIAGNOSIS — F331 Major depressive disorder, recurrent, moderate: Secondary | ICD-10-CM | POA: Diagnosis not present

## 2021-02-12 DIAGNOSIS — K219 Gastro-esophageal reflux disease without esophagitis: Secondary | ICD-10-CM | POA: Diagnosis not present

## 2021-02-13 DIAGNOSIS — D473 Essential (hemorrhagic) thrombocythemia: Secondary | ICD-10-CM

## 2021-02-13 HISTORY — DX: Essential (hemorrhagic) thrombocythemia: D47.3

## 2021-02-14 ENCOUNTER — Telehealth: Payer: Self-pay | Admitting: Dermatology

## 2021-02-14 DIAGNOSIS — C4442 Squamous cell carcinoma of skin of scalp and neck: Secondary | ICD-10-CM | POA: Diagnosis not present

## 2021-02-14 DIAGNOSIS — N189 Chronic kidney disease, unspecified: Secondary | ICD-10-CM | POA: Diagnosis not present

## 2021-02-14 DIAGNOSIS — Z87440 Personal history of urinary (tract) infections: Secondary | ICD-10-CM | POA: Diagnosis not present

## 2021-02-14 DIAGNOSIS — F419 Anxiety disorder, unspecified: Secondary | ICD-10-CM | POA: Diagnosis not present

## 2021-02-14 DIAGNOSIS — G4700 Insomnia, unspecified: Secondary | ICD-10-CM | POA: Diagnosis not present

## 2021-02-14 DIAGNOSIS — F17210 Nicotine dependence, cigarettes, uncomplicated: Secondary | ICD-10-CM | POA: Diagnosis not present

## 2021-02-14 DIAGNOSIS — R1310 Dysphagia, unspecified: Secondary | ICD-10-CM | POA: Diagnosis not present

## 2021-02-14 DIAGNOSIS — I129 Hypertensive chronic kidney disease with stage 1 through stage 4 chronic kidney disease, or unspecified chronic kidney disease: Secondary | ICD-10-CM | POA: Diagnosis not present

## 2021-02-14 DIAGNOSIS — K5900 Constipation, unspecified: Secondary | ICD-10-CM | POA: Diagnosis not present

## 2021-02-14 DIAGNOSIS — A419 Sepsis, unspecified organism: Secondary | ICD-10-CM | POA: Diagnosis not present

## 2021-02-14 DIAGNOSIS — K219 Gastro-esophageal reflux disease without esophagitis: Secondary | ICD-10-CM | POA: Diagnosis not present

## 2021-02-14 DIAGNOSIS — R262 Difficulty in walking, not elsewhere classified: Secondary | ICD-10-CM | POA: Diagnosis not present

## 2021-02-14 DIAGNOSIS — M170 Bilateral primary osteoarthritis of knee: Secondary | ICD-10-CM | POA: Diagnosis not present

## 2021-02-14 DIAGNOSIS — Z9181 History of falling: Secondary | ICD-10-CM | POA: Diagnosis not present

## 2021-02-14 DIAGNOSIS — F32A Depression, unspecified: Secondary | ICD-10-CM | POA: Diagnosis not present

## 2021-02-14 NOTE — Telephone Encounter (Signed)
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 Discussed options including ED&C, topical therapy, radiation as she has declined Mohs at this time. He will discuss with her and call us back.

## 2021-02-19 DIAGNOSIS — K5900 Constipation, unspecified: Secondary | ICD-10-CM | POA: Diagnosis not present

## 2021-02-19 DIAGNOSIS — F419 Anxiety disorder, unspecified: Secondary | ICD-10-CM | POA: Diagnosis not present

## 2021-02-19 DIAGNOSIS — K219 Gastro-esophageal reflux disease without esophagitis: Secondary | ICD-10-CM | POA: Diagnosis not present

## 2021-02-19 DIAGNOSIS — R262 Difficulty in walking, not elsewhere classified: Secondary | ICD-10-CM | POA: Diagnosis not present

## 2021-02-19 DIAGNOSIS — N189 Chronic kidney disease, unspecified: Secondary | ICD-10-CM | POA: Diagnosis not present

## 2021-02-19 DIAGNOSIS — M170 Bilateral primary osteoarthritis of knee: Secondary | ICD-10-CM | POA: Diagnosis not present

## 2021-02-19 DIAGNOSIS — Z9181 History of falling: Secondary | ICD-10-CM | POA: Diagnosis not present

## 2021-02-19 DIAGNOSIS — C4442 Squamous cell carcinoma of skin of scalp and neck: Secondary | ICD-10-CM | POA: Diagnosis not present

## 2021-02-19 DIAGNOSIS — G4700 Insomnia, unspecified: Secondary | ICD-10-CM | POA: Diagnosis not present

## 2021-02-19 DIAGNOSIS — I129 Hypertensive chronic kidney disease with stage 1 through stage 4 chronic kidney disease, or unspecified chronic kidney disease: Secondary | ICD-10-CM | POA: Diagnosis not present

## 2021-02-19 DIAGNOSIS — A419 Sepsis, unspecified organism: Secondary | ICD-10-CM | POA: Diagnosis not present

## 2021-02-19 DIAGNOSIS — F32A Depression, unspecified: Secondary | ICD-10-CM | POA: Diagnosis not present

## 2021-02-19 DIAGNOSIS — Z87440 Personal history of urinary (tract) infections: Secondary | ICD-10-CM | POA: Diagnosis not present

## 2021-02-19 DIAGNOSIS — R1310 Dysphagia, unspecified: Secondary | ICD-10-CM | POA: Diagnosis not present

## 2021-02-19 DIAGNOSIS — F17210 Nicotine dependence, cigarettes, uncomplicated: Secondary | ICD-10-CM | POA: Diagnosis not present

## 2021-02-20 DIAGNOSIS — D529 Folate deficiency anemia, unspecified: Secondary | ICD-10-CM | POA: Diagnosis not present

## 2021-02-20 DIAGNOSIS — E559 Vitamin D deficiency, unspecified: Secondary | ICD-10-CM | POA: Diagnosis not present

## 2021-02-20 DIAGNOSIS — E119 Type 2 diabetes mellitus without complications: Secondary | ICD-10-CM | POA: Diagnosis not present

## 2021-02-20 DIAGNOSIS — I10 Essential (primary) hypertension: Secondary | ICD-10-CM | POA: Diagnosis not present

## 2021-02-26 ENCOUNTER — Other Ambulatory Visit: Payer: Self-pay | Admitting: *Deleted

## 2021-02-26 DIAGNOSIS — D500 Iron deficiency anemia secondary to blood loss (chronic): Secondary | ICD-10-CM

## 2021-02-27 DIAGNOSIS — N189 Chronic kidney disease, unspecified: Secondary | ICD-10-CM | POA: Diagnosis not present

## 2021-02-27 DIAGNOSIS — F32A Depression, unspecified: Secondary | ICD-10-CM | POA: Diagnosis not present

## 2021-02-27 DIAGNOSIS — F17210 Nicotine dependence, cigarettes, uncomplicated: Secondary | ICD-10-CM | POA: Diagnosis not present

## 2021-02-27 DIAGNOSIS — N1832 Chronic kidney disease, stage 3b: Secondary | ICD-10-CM | POA: Diagnosis not present

## 2021-02-27 DIAGNOSIS — R600 Localized edema: Secondary | ICD-10-CM | POA: Diagnosis not present

## 2021-02-27 DIAGNOSIS — I10 Essential (primary) hypertension: Secondary | ICD-10-CM | POA: Diagnosis not present

## 2021-02-27 DIAGNOSIS — M170 Bilateral primary osteoarthritis of knee: Secondary | ICD-10-CM | POA: Diagnosis not present

## 2021-02-27 DIAGNOSIS — A419 Sepsis, unspecified organism: Secondary | ICD-10-CM | POA: Diagnosis not present

## 2021-02-27 DIAGNOSIS — F331 Major depressive disorder, recurrent, moderate: Secondary | ICD-10-CM | POA: Diagnosis not present

## 2021-02-27 DIAGNOSIS — R262 Difficulty in walking, not elsewhere classified: Secondary | ICD-10-CM | POA: Diagnosis not present

## 2021-02-27 DIAGNOSIS — C4442 Squamous cell carcinoma of skin of scalp and neck: Secondary | ICD-10-CM | POA: Diagnosis not present

## 2021-02-27 DIAGNOSIS — K5900 Constipation, unspecified: Secondary | ICD-10-CM | POA: Diagnosis not present

## 2021-02-27 DIAGNOSIS — Z9181 History of falling: Secondary | ICD-10-CM | POA: Diagnosis not present

## 2021-02-27 DIAGNOSIS — F419 Anxiety disorder, unspecified: Secondary | ICD-10-CM | POA: Diagnosis not present

## 2021-02-27 DIAGNOSIS — R1310 Dysphagia, unspecified: Secondary | ICD-10-CM | POA: Diagnosis not present

## 2021-02-27 DIAGNOSIS — K219 Gastro-esophageal reflux disease without esophagitis: Secondary | ICD-10-CM | POA: Diagnosis not present

## 2021-02-27 DIAGNOSIS — G4700 Insomnia, unspecified: Secondary | ICD-10-CM | POA: Diagnosis not present

## 2021-02-27 DIAGNOSIS — I129 Hypertensive chronic kidney disease with stage 1 through stage 4 chronic kidney disease, or unspecified chronic kidney disease: Secondary | ICD-10-CM | POA: Diagnosis not present

## 2021-02-27 DIAGNOSIS — Z87440 Personal history of urinary (tract) infections: Secondary | ICD-10-CM | POA: Diagnosis not present

## 2021-02-28 DIAGNOSIS — C4442 Squamous cell carcinoma of skin of scalp and neck: Secondary | ICD-10-CM | POA: Diagnosis not present

## 2021-02-28 DIAGNOSIS — M170 Bilateral primary osteoarthritis of knee: Secondary | ICD-10-CM | POA: Diagnosis not present

## 2021-02-28 DIAGNOSIS — R262 Difficulty in walking, not elsewhere classified: Secondary | ICD-10-CM | POA: Diagnosis not present

## 2021-02-28 DIAGNOSIS — K5900 Constipation, unspecified: Secondary | ICD-10-CM | POA: Diagnosis not present

## 2021-02-28 DIAGNOSIS — F32A Depression, unspecified: Secondary | ICD-10-CM | POA: Diagnosis not present

## 2021-02-28 DIAGNOSIS — Z9181 History of falling: Secondary | ICD-10-CM | POA: Diagnosis not present

## 2021-02-28 DIAGNOSIS — F419 Anxiety disorder, unspecified: Secondary | ICD-10-CM | POA: Diagnosis not present

## 2021-02-28 DIAGNOSIS — A419 Sepsis, unspecified organism: Secondary | ICD-10-CM | POA: Diagnosis not present

## 2021-02-28 DIAGNOSIS — R1310 Dysphagia, unspecified: Secondary | ICD-10-CM | POA: Diagnosis not present

## 2021-02-28 DIAGNOSIS — G4700 Insomnia, unspecified: Secondary | ICD-10-CM | POA: Diagnosis not present

## 2021-02-28 DIAGNOSIS — I129 Hypertensive chronic kidney disease with stage 1 through stage 4 chronic kidney disease, or unspecified chronic kidney disease: Secondary | ICD-10-CM | POA: Diagnosis not present

## 2021-02-28 DIAGNOSIS — Z87440 Personal history of urinary (tract) infections: Secondary | ICD-10-CM | POA: Diagnosis not present

## 2021-02-28 DIAGNOSIS — N189 Chronic kidney disease, unspecified: Secondary | ICD-10-CM | POA: Diagnosis not present

## 2021-02-28 DIAGNOSIS — K219 Gastro-esophageal reflux disease without esophagitis: Secondary | ICD-10-CM | POA: Diagnosis not present

## 2021-02-28 DIAGNOSIS — F17210 Nicotine dependence, cigarettes, uncomplicated: Secondary | ICD-10-CM | POA: Diagnosis not present

## 2021-03-05 ENCOUNTER — Inpatient Hospital Stay (HOSPITAL_BASED_OUTPATIENT_CLINIC_OR_DEPARTMENT_OTHER): Payer: PPO | Admitting: Oncology

## 2021-03-05 ENCOUNTER — Inpatient Hospital Stay: Payer: PPO | Attending: Nurse Practitioner

## 2021-03-05 ENCOUNTER — Other Ambulatory Visit: Payer: Self-pay

## 2021-03-05 ENCOUNTER — Inpatient Hospital Stay: Payer: PPO

## 2021-03-05 ENCOUNTER — Encounter: Payer: Self-pay | Admitting: Oncology

## 2021-03-05 VITALS — BP 145/65 | HR 85 | Temp 97.5°F | Resp 16 | Ht 65.0 in | Wt 110.0 lb

## 2021-03-05 DIAGNOSIS — D473 Essential (hemorrhagic) thrombocythemia: Secondary | ICD-10-CM | POA: Diagnosis not present

## 2021-03-05 DIAGNOSIS — D75839 Thrombocytosis, unspecified: Secondary | ICD-10-CM | POA: Diagnosis not present

## 2021-03-05 DIAGNOSIS — F419 Anxiety disorder, unspecified: Secondary | ICD-10-CM | POA: Diagnosis not present

## 2021-03-05 DIAGNOSIS — R1310 Dysphagia, unspecified: Secondary | ICD-10-CM | POA: Diagnosis not present

## 2021-03-05 DIAGNOSIS — I129 Hypertensive chronic kidney disease with stage 1 through stage 4 chronic kidney disease, or unspecified chronic kidney disease: Secondary | ICD-10-CM | POA: Diagnosis not present

## 2021-03-05 DIAGNOSIS — F32A Depression, unspecified: Secondary | ICD-10-CM | POA: Diagnosis not present

## 2021-03-05 DIAGNOSIS — Z87440 Personal history of urinary (tract) infections: Secondary | ICD-10-CM | POA: Diagnosis not present

## 2021-03-05 DIAGNOSIS — Z9181 History of falling: Secondary | ICD-10-CM | POA: Diagnosis not present

## 2021-03-05 DIAGNOSIS — R262 Difficulty in walking, not elsewhere classified: Secondary | ICD-10-CM | POA: Diagnosis not present

## 2021-03-05 DIAGNOSIS — K219 Gastro-esophageal reflux disease without esophagitis: Secondary | ICD-10-CM | POA: Diagnosis not present

## 2021-03-05 DIAGNOSIS — G4700 Insomnia, unspecified: Secondary | ICD-10-CM | POA: Diagnosis not present

## 2021-03-05 DIAGNOSIS — F17210 Nicotine dependence, cigarettes, uncomplicated: Secondary | ICD-10-CM | POA: Diagnosis not present

## 2021-03-05 DIAGNOSIS — N189 Chronic kidney disease, unspecified: Secondary | ICD-10-CM | POA: Diagnosis not present

## 2021-03-05 DIAGNOSIS — A419 Sepsis, unspecified organism: Secondary | ICD-10-CM | POA: Diagnosis not present

## 2021-03-05 DIAGNOSIS — M170 Bilateral primary osteoarthritis of knee: Secondary | ICD-10-CM | POA: Diagnosis not present

## 2021-03-05 DIAGNOSIS — C4442 Squamous cell carcinoma of skin of scalp and neck: Secondary | ICD-10-CM | POA: Diagnosis not present

## 2021-03-05 DIAGNOSIS — D500 Iron deficiency anemia secondary to blood loss (chronic): Secondary | ICD-10-CM

## 2021-03-05 DIAGNOSIS — K5900 Constipation, unspecified: Secondary | ICD-10-CM | POA: Diagnosis not present

## 2021-03-05 LAB — CBC WITH DIFFERENTIAL/PLATELET
Abs Immature Granulocytes: 0.07 10*3/uL (ref 0.00–0.07)
Basophils Absolute: 0.1 10*3/uL (ref 0.0–0.1)
Basophils Relative: 0 %
Eosinophils Absolute: 0.4 10*3/uL (ref 0.0–0.5)
Eosinophils Relative: 3 %
HCT: 43.1 % (ref 36.0–46.0)
Hemoglobin: 13 g/dL (ref 12.0–15.0)
Immature Granulocytes: 1 %
Lymphocytes Relative: 11 %
Lymphs Abs: 1.4 10*3/uL (ref 0.7–4.0)
MCH: 25.4 pg — ABNORMAL LOW (ref 26.0–34.0)
MCHC: 30.2 g/dL (ref 30.0–36.0)
MCV: 84.3 fL (ref 80.0–100.0)
Monocytes Absolute: 0.7 10*3/uL (ref 0.1–1.0)
Monocytes Relative: 5 %
Neutro Abs: 10.8 10*3/uL — ABNORMAL HIGH (ref 1.7–7.7)
Neutrophils Relative %: 80 %
Platelets: 952 10*3/uL (ref 150–400)
RBC: 5.11 MIL/uL (ref 3.87–5.11)
RDW: 21.5 % — ABNORMAL HIGH (ref 11.5–15.5)
Smear Review: INCREASED
WBC: 13.5 10*3/uL — ABNORMAL HIGH (ref 4.0–10.5)
nRBC: 0 % (ref 0.0–0.2)

## 2021-03-05 LAB — IRON AND TIBC
Iron: 56 ug/dL (ref 28–170)
Saturation Ratios: 18 % (ref 10.4–31.8)
TIBC: 308 ug/dL (ref 250–450)
UIBC: 252 ug/dL

## 2021-03-05 LAB — FERRITIN: Ferritin: 115 ng/mL (ref 11–307)

## 2021-03-05 LAB — SEDIMENTATION RATE: Sed Rate: 5 mm/hr (ref 0–30)

## 2021-03-05 NOTE — Progress Notes (Signed)
Hematology/Oncology Consult note [facility]  [phone] [phone]  Patient Care Team: Derinda Late, MD as PCP - General (Family Medicine)   Name of the patient: Tina Ingram  [id number]  03-06-1934   Date of visit: 03/05/21  Diagnosis-thrombocytosis of unclear etiology  Chief complaint/ Reason for visit- routine follow-up of thrombocytosis  Heme/Onc history: Patient is a 87 year old female with a past medical history significant for hypertension, osteoporosis, history of falls and C. difficile infection referred for thrombocytosis.  She was in the hospital in December 2022 for infectious colitis and was found to have platelet counts between [phone].  Looking back at her prior CBCs patient had a normal platelet count up until June 2022 although she has had a transient platelet count of 1240 in December 2020.  Over the last 6 months her platelet counts have been waxing and waning between [phone].  She was found to have evidence of iron deficiency which was corrected and her platelet counts went down to 493 but are back up to 952 today.  Patient is a resident of [facility]  Interval history-no acute issues in the last 1 month since her hospital discharge.  She reports chronic fatigue.  Denies any prior history of thrombosis.  Denies any bleeding issues.  ECOG PS- 2 Pain scale- 0   Review of systems- Review of Systems  Constitutional:  Positive for malaise/fatigue. Negative for chills, fever and weight loss.  HENT:  Negative for congestion, ear discharge and nosebleeds.   Eyes:  Negative for blurred vision.  Respiratory:  Negative for cough, hemoptysis, sputum production, shortness of breath and wheezing.   Cardiovascular:  Negative for chest pain, palpitations, orthopnea and claudication.  Gastrointestinal:  Negative for abdominal pain, blood in stool, constipation, diarrhea, heartburn, melena, nausea and vomiting.   Genitourinary:  Negative for dysuria, flank pain, frequency, hematuria and urgency.  Musculoskeletal:  Negative for back pain, joint pain and myalgias.  Skin:  Negative for rash.  Neurological:  Negative for dizziness, tingling, focal weakness, seizures, weakness and headaches.  Endo/Heme/Allergies:  Does not bruise/bleed easily.  Psychiatric/Behavioral:  Negative for depression and suicidal ideas. The patient does not have insomnia.      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   left vertex scalp - MOHS 08/21/2020 ([facility] nasal tip     Past Surgical History:  Procedure Laterality Date   APPENDECTOMY     BREAST BIOPSY Left 05/16/2014   complex sclerosing lesion    BREAST EXCISIONAL BIOPSY Left 2016   surgical exc to remove complex sclerosing lesion   BREAST LUMPECTOMY WITH RADIOACTIVE SEED LOCALIZATION Left 06/20/2014   Procedure: BREAST LUMPECTOMY WITH RADIOACTIVE SEED LOCALIZATION;  Surgeon: Erroll Luna, MD;  Location: [facility];  Service: General;  Laterality: Left;   CAROTID ENDARTERECTOMY Right    CATARACT EXTRACTION W/PHACO Right 06/25/2015   Procedure: CATARACT EXTRACTION PHACO AND INTRAOCULAR LENS PLACEMENT ([place]);  Surgeon: Estill Cotta, MD;  Location: [facility];  Service: Ophthalmology;  Laterality: Right;  [place] 2.01 AP% 24.5 CDE 52.16 Fluid pack lot # [id number]   COLONOSCOPY     COLONOSCOPY WITH PROPOFOL  N/A 07/07/2020   Procedure: COLONOSCOPY WITH PROPOFOL;  Surgeon: Lesly Rubenstein, MD;  Location: [facility] ENDOSCOPY;  Service: Endoscopy;  Laterality: N/A;   ESOPHAGOGASTRODUODENOSCOPY N/A 07/10/2020   Procedure: ESOPHAGOGASTRODUODENOSCOPY (EGD);  Surgeon: Jonathon Bellows, MD;   Location: [facility] ENDOSCOPY;  Service: Gastroenterology;  Laterality: N/A;   ESOPHAGOGASTRODUODENOSCOPY (EGD) WITH PROPOFOL N/A 07/06/2020   Procedure: ESOPHAGOGASTRODUODENOSCOPY (EGD) WITH PROPOFOL;  Surgeon: [name], MD;  Location: [facility] ENDOSCOPY;  Service: Endoscopy;  Laterality: N/A;   EYE SURGERY     KYPHOPLASTY N/A 01/27/2017   Procedure: [id number];  Surgeon: Hessie Knows, MD;  Location: [facility];  Service: Orthopedics;  Laterality: N/A;  T-9     Social History   Socioeconomic History   Marital status: Divorced    Spouse name: Not on file   Number of children: Not on file   Years of education: Not on file   Highest education level: Not on file  Occupational History   Not on file  Tobacco Use   Smoking status: Every Day    Packs/day: 1.00    Types: Cigarettes   Smokeless tobacco: Never  Vaping Use   Vaping Use: Never used  Substance and Sexual Activity   Alcohol use: No   Drug use: Never   Sexual activity: Never  Other Topics Concern   Not on file  Social History Narrative   Not on file   Social Determinants of Health   Financial Resource Strain: Not on file  Food Insecurity: Not on file  Transportation Needs: Not on file  Physical Activity: Not on file  Stress: Not on file  Social Connections: Not on file  Intimate Partner Violence: Not on file    Family History  Problem Relation Age of Onset   Breast cancer Neg Hx      Current Outpatient Medications:    acetaminophen (TYLENOL) 325 MG tablet, Take 2 tablets (650 mg total) by mouth every 6 (six) hours as needed for mild pain (or Fever >/= 101). (Patient taking differently: Take 650 mg by mouth 3 (three) times daily.), Disp: , Rfl:    amLODipine (NORVASC) 2.5 MG tablet, Take 2.5 mg by mouth daily., Disp: , Rfl:    busPIRone (BUSPAR) 7.5 MG tablet, Take 7.5 mg by mouth 2 (two) times daily., Disp: , Rfl:    cetirizine (ZYRTEC) 10 MG tablet, Take 10 mg by mouth daily., Disp: , Rfl:    Cholecalciferol  (VITAMIN D) 50 MCG (2000 UT) tablet, Take 2,000 Units by mouth daily., Disp: , Rfl:    diclofenac Sodium (VOLTAREN) 1 % GEL, Apply 1 application topically 4 (four) times daily as needed (pain)., Disp: , Rfl:    hydrochlorothiazide (MICROZIDE) 12.5 MG capsule, Take 12.5 mg by mouth daily., Disp: , Rfl:    iron polysaccharides (NIFEREX) 150 MG capsule, Take 1 capsule (150 mg total) by mouth daily., Disp: 30 capsule, Rfl: 3   melatonin 3 MG TABS tablet, Take 3 mg by mouth at bedtime., Disp: , Rfl:    Multiple Vitamin (MULTIVITAMIN) tablet, Take 1 tablet by mouth daily., Disp: , Rfl:    pantoprazole (PROTONIX) 40 MG tablet, Take 40 mg by mouth daily., Disp: , Rfl:    senna-docusate (SENOKOT-S) 8.6-50 MG tablet, Take 1 tablet by mouth at bedtime as needed for mild constipation., Disp: , Rfl:    traZODone (DESYREL) 50 MG tablet, Take 50 mg by mouth at bedtime., Disp: , Rfl:   Physical exam:  Vitals:   03/05/21 1403  BP: (!) 145/65  Pulse: 85  Resp: 16  Temp: (!) 97.5 F (36.4 C)  TempSrc: Tympanic  SpO2: 99%  Weight: 110 lb (49.9 kg)  Height: _0  (1.651 m)   Physical Exam Constitutional:      Comments: Thin elderly frail woman sitting in a wheelchair.  Appears in no acute distress  Cardiovascular:     Rate and Rhythm: Normal rate and regular rhythm.     Heart sounds: Normal heart sounds.  Pulmonary:     Effort: Pulmonary effort is normal.     Breath sounds: Normal breath sounds.  Abdominal:     General: Bowel sounds are normal.     Palpations: Abdomen is soft.     Comments: No palpable hepatosplenomegaly  Lymphadenopathy:     Comments: No palpable supraclavicular cervical or axillary adenopathy  Skin:    General: Skin is warm and dry.  Neurological:     Mental Status: She is alert and oriented to person, place, and time.     CMP Latest Ref Rng & Units 12/13/2020  Glucose 70 - 99 mg/dL 154(H)  BUN 8 - 23 mg/dL 36(H)  Creatinine 0.44 - 1.00 mg/dL 1.66(H)  Sodium 135 - 145  mmol/L 138  Potassium 3.5 - 5.1 mmol/L 3.3(L)  Chloride 98 - 111 mmol/L 104  CO2 22 - 32 mmol/L 25  Calcium 8.9 - 10.3 mg/dL 9.7  Total Protein 6.5 - 8.1 g/dL -  Total Bilirubin 0.3 - 1.2 mg/dL -  Alkaline Phos 38 - 126 U/L -  AST 15 - 41 U/L -  ALT 0 - 44 U/L -   CBC Latest Ref Rng & Units 03/05/2021  WBC 4.0 - 10.5 K/uL 13.5(H)  Hemoglobin 12.0 - 15.0 g/dL 13.0  Hematocrit 36.0 - 46.0 % 43.1  Platelets 150 - 400 K/uL 952(HH)    Assessment and plan- Patient is a 87 y.o. female with thrombocytosis  Thrombocytosis: Patient has had a waxing and waning thrombocytosis which varies widely between 400s to 1000's.  Her platelet count  was down to 492 months ago and is back up to 950 today.  Presently patient does not have any evidence of iron deficiency given that her ferritin is normal at 115 and iron saturation of 18%.  Also her ESR is normal at 5 and patient does not have any signs and symptoms of infection.  I have therefore ordered additional thrombocytosis work-up including JAK2 with reflex to CALR and MPL as well as BCR ABL FISH testing.  I am inclined to monitor her platelet count conservatively every month and I will see her back in 3 months but sooner if any of her above blood work comes back positive that would be suggestive of myeloproliferative disorder.   Visit Diagnosis 1. Thrombocytosis      Dr. Randa Evens, MD, MPH [facility] [phone] 03/05/2021 5:11 PM

## 2021-03-07 ENCOUNTER — Encounter: Payer: Self-pay | Admitting: Oncology

## 2021-03-07 LAB — JAK2 W/REFLEX TO CALR/MPL

## 2021-03-08 ENCOUNTER — Emergency Department: Payer: PPO

## 2021-03-08 ENCOUNTER — Emergency Department
Admission: EM | Admit: 2021-03-08 | Discharge: 2021-03-08 | Disposition: A | Discharge status: Home / Self Care | Payer: PPO | Attending: Emergency Medicine | Admitting: Emergency Medicine

## 2021-03-08 DIAGNOSIS — K8020 Calculus of gallbladder without cholecystitis without obstruction: Secondary | ICD-10-CM | POA: Diagnosis not present

## 2021-03-08 DIAGNOSIS — R112 Nausea with vomiting, unspecified: Secondary | ICD-10-CM | POA: Diagnosis not present

## 2021-03-08 DIAGNOSIS — R1111 Vomiting without nausea: Secondary | ICD-10-CM | POA: Diagnosis not present

## 2021-03-08 DIAGNOSIS — Z79899 Other long term (current) drug therapy: Secondary | ICD-10-CM | POA: Diagnosis not present

## 2021-03-08 DIAGNOSIS — R1110 Vomiting, unspecified: Secondary | ICD-10-CM | POA: Insufficient documentation

## 2021-03-08 DIAGNOSIS — R1011 Right upper quadrant pain: Secondary | ICD-10-CM

## 2021-03-08 DIAGNOSIS — D72829 Elevated white blood cell count, unspecified: Secondary | ICD-10-CM | POA: Insufficient documentation

## 2021-03-08 DIAGNOSIS — Z20822 Contact with and (suspected) exposure to covid-19: Secondary | ICD-10-CM | POA: Diagnosis not present

## 2021-03-08 DIAGNOSIS — E860 Dehydration: Secondary | ICD-10-CM | POA: Diagnosis not present

## 2021-03-08 DIAGNOSIS — D75839 Thrombocytosis, unspecified: Secondary | ICD-10-CM | POA: Diagnosis not present

## 2021-03-08 DIAGNOSIS — R0902 Hypoxemia: Secondary | ICD-10-CM | POA: Diagnosis not present

## 2021-03-08 DIAGNOSIS — I10 Essential (primary) hypertension: Secondary | ICD-10-CM | POA: Diagnosis not present

## 2021-03-08 DIAGNOSIS — R110 Nausea: Secondary | ICD-10-CM | POA: Diagnosis not present

## 2021-03-08 DIAGNOSIS — R9431 Abnormal electrocardiogram [ECG] [EKG]: Secondary | ICD-10-CM | POA: Diagnosis not present

## 2021-03-08 LAB — PATHOLOGIST SMEAR REVIEW

## 2021-03-08 LAB — CBC WITH DIFFERENTIAL/PLATELET
Abs Immature Granulocytes: 0.09 10*3/uL — ABNORMAL HIGH (ref 0.00–0.07)
Basophils Absolute: 0.1 10*3/uL (ref 0.0–0.1)
Basophils Relative: 0 %
Eosinophils Absolute: 0.4 10*3/uL (ref 0.0–0.5)
Eosinophils Relative: 2 %
HCT: 43.3 % (ref 36.0–46.0)
Hemoglobin: 13 g/dL (ref 12.0–15.0)
Immature Granulocytes: 1 %
Lymphocytes Relative: 7 %
Lymphs Abs: 1.3 10*3/uL (ref 0.7–4.0)
MCH: 25.4 pg — ABNORMAL LOW (ref 26.0–34.0)
MCHC: 30 g/dL (ref 30.0–36.0)
MCV: 84.6 fL (ref 80.0–100.0)
Monocytes Absolute: 1 10*3/uL (ref 0.1–1.0)
Monocytes Relative: 6 %
Neutro Abs: 15.3 10*3/uL — ABNORMAL HIGH (ref 1.7–7.7)
Neutrophils Relative %: 84 %
Platelets: 1063 10*3/uL (ref 150–400)
RBC: 5.12 MIL/uL — ABNORMAL HIGH (ref 3.87–5.11)
RDW: 21.6 % — ABNORMAL HIGH (ref 11.5–15.5)
Smear Review: INCREASED
WBC: 18.1 10*3/uL — ABNORMAL HIGH (ref 4.0–10.5)
nRBC: 0 % (ref 0.0–0.2)

## 2021-03-08 LAB — URINALYSIS, COMPLETE (UACMP) WITH MICROSCOPIC
Bacteria, UA: NONE SEEN
Bilirubin Urine: NEGATIVE
Glucose, UA: NEGATIVE mg/dL
Hgb urine dipstick: NEGATIVE
Ketones, ur: 5 mg/dL — AB
Nitrite: NEGATIVE
Protein, ur: NEGATIVE mg/dL
Specific Gravity, Urine: 1.009 (ref 1.005–1.030)
WBC, UA: >50 WBC/hpf — ABNORMAL HIGH (ref 0–5)
pH: 8 (ref 5.0–8.0)

## 2021-03-08 LAB — BCR-ABL1 FISH
Cells Analyzed: 200
Cells Counted: 200

## 2021-03-08 LAB — COMPREHENSIVE METABOLIC PANEL
ALT: 13 U/L (ref 0–44)
AST: 23 U/L (ref 15–41)
Albumin: 4 g/dL (ref 3.5–5.0)
Alkaline Phosphatase: 79 U/L (ref 38–126)
Anion gap: 12 (ref 5–15)
BUN: 23 mg/dL (ref 8–23)
CO2: 30 mmol/L (ref 22–32)
Calcium: 9.7 mg/dL (ref 8.9–10.3)
Chloride: 100 mmol/L (ref 98–111)
Creatinine, Ser: 1.47 mg/dL — ABNORMAL HIGH (ref 0.44–1.00)
GFR, Estimated: 35 mL/min — ABNORMAL LOW (ref >60)
Glucose, Bld: 91 mg/dL (ref 70–99)
Potassium: 3.7 mmol/L (ref 3.5–5.1)
Sodium: 142 mmol/L (ref 135–145)
Total Bilirubin: 0.8 mg/dL (ref 0.3–1.2)
Total Protein: 6.8 g/dL (ref 6.5–8.1)

## 2021-03-08 LAB — LIPASE, BLOOD: Lipase: 37 U/L (ref 11–51)

## 2021-03-08 LAB — RESP PANEL BY RT-PCR (FLU A&B, COVID) ARPGX2
Influenza A by PCR: NEGATIVE
Influenza B by PCR: NEGATIVE
SARS Coronavirus 2 by RT PCR: NEGATIVE

## 2021-03-08 MED ORDER — ONDANSETRON 4 MG PO TBDP: 4.0000 mg | ORAL_TABLET | Freq: Three times a day (TID) | ORAL | 0 refills | Status: DC | PRN

## 2021-03-08 MED ORDER — AMLODIPINE BESYLATE 5 MG PO TABS
2.5000 mg | ORAL_TABLET | Freq: Once | ORAL | Status: AC
  Administered 2021-03-08: 2.5 mg via ORAL
  Filled 2021-03-08: qty 1

## 2021-03-08 MED ORDER — HYDROCHLOROTHIAZIDE 12.5 MG PO TABS
12.5000 mg | ORAL_TABLET | Freq: Once | ORAL | Status: AC
  Administered 2021-03-08: 12.5 mg via ORAL
  Filled 2021-03-08: qty 1

## 2021-03-08 NOTE — ED Provider Notes (Signed)
[facility] Provider Note  Patient Contact: 4:58 PM (approximate)   History   Emesis   HPI  Tina Ingram is a 87 y.o. female who presents the emergency department for evaluation for vomiting.  According to the patient she has had 3 episodes of emesis over the past 3 weeks.  She states that it typically occurs after taking her morning medications.  She has no fevers, chills, URI symptoms, abdominal pain.  She states that she has been put on oral iron supplements due to iron deficiency anemia as well as IV iron and feels that the iron is causing her to have cramping/vomiting.  She has no nausea or abdominal pain at this time.  She has had no bilious vomit.  No chest pain, shortness of breath.  Patient also has a history of thrombocytosis that oncology is currently following.  Her platelets have been anywhere from the 400s to 1200s.  Patient states that she is still ongoing the work-up to determine the cause of same.  Patient states that she would like to be checked for UTI that she has no symptoms currently.  She states that in the past she has been told when she has had some vague symptoms that she has had a UTI.     Physical Exam   Triage Vital Signs: ED Triage Vitals  Enc Vitals Group     BP 03/08/21 1338 (!) 181/90     Pulse Rate 03/08/21 1338 92     Resp 03/08/21 1339 18     Temp 03/08/21 1338 97.8 F (36.6 C)     Temp Source 03/08/21 1338 Oral     SpO2 03/08/21 1338 94 %     Weight 03/08/21 1341 110 lb (49.9 kg)     Height 03/08/21 1341 5\' 5"  (1.651 m)     Head Circumference --      Peak Flow --      Pain Score 03/08/21 1340 2     Pain Loc --      Pain Edu? --      Excl. in [place]? --     Most recent vital signs: Vitals:   03/08/21 1900 03/08/21 1931  BP:  (!) 200/91  Pulse: 97 100  Resp:  18  Temp:    SpO2: 94% 95%     General: Alert and in no acute distress. ENT:      Ears:       Nose: No congestion/rhinnorhea.      Mouth/Throat:  Mucous membranes are moist. Hematological/Lymphatic/Immunilogical: No cervical lymphadenopathy. Cardiovascular:  Good peripheral perfusion Respiratory: Normal respiratory effort without tachypnea or retractions. Lungs CTAB. Good air entry to the bases with no decreased or absent breath sounds. Gastrointestinal: Bowel sounds 4 quadrants.  Soft to palpate patient all quadrants.  Patient is tender to palpation in the right upper quadrant.  No other tenderness to palpation.  No guarding or rigidity. No palpable masses. No distention. No CVA tenderness. Musculoskeletal: Full range of motion to all extremities.  Neurologic:  No gross focal neurologic deficits are appreciated.  Skin:   No rash noted Other:   ED Results / Procedures / Treatments   Labs (all labs ordered are listed, but only abnormal results are displayed) Labs Reviewed  CBC WITH DIFFERENTIAL/PLATELET - Abnormal; Notable for the following components:      Result Value   WBC 18.1 (*)    RBC 5.12 (*)    MCH 25.4 (*)    RDW  21.6 (*)    Platelets 1,063 (*)    Neutro Abs 15.3 (*)    Abs Immature Granulocytes 0.09 (*)    All other components within normal limits  COMPREHENSIVE METABOLIC PANEL - Abnormal; Notable for the following components:   Creatinine, Ser 1.47 (*)    GFR, Estimated 35 (*)    All other components within normal limits  URINALYSIS, COMPLETE (UACMP) WITH MICROSCOPIC - Abnormal; Notable for the following components:   Color, Urine YELLOW (*)    APPearance HAZY (*)    Ketones, ur 5 (*)    Leukocytes,Ua LARGE (*)    WBC, UA >50 (*)    All other components within normal limits  RESP PANEL BY RT-PCR (FLU A&B, COVID) [id number]  PATHOLOGIST SMEAR REVIEW  LIPASE, BLOOD     EKG     RADIOLOGY  I personally viewed and evaluated these images as part of my medical decision making, as well as reviewing the written report by the radiologist.  ED Provider Interpretation: Cholelithiasis without evidence of  cholecystitis  US Abdomen Limited RUQ (LIVER/GB)  Result Date: 03/08/2021 CLINICAL DATA:  Right upper quadrant pain EXAM: ULTRASOUND ABDOMEN LIMITED RIGHT UPPER QUADRANT COMPARISON:  CT 12/11/2020 FINDINGS: Gallbladder: Gallstones. Normal wall thickness. No sonographic Percell Miller is indicated Common bile duct: Diameter: 4 mm Liver: No focal lesion identified. Within normal limits in parenchymal echogenicity. Portal vein is patent on color Doppler imaging with normal direction of blood flow towards the liver. Other: Right kidney appears slightly echogenic. Cyst at the lower pole of the right kidney. IMPRESSION: 1. Cholelithiasis without sonographic evidence for acute cholecystitis or biliary dilatation 2. Echogenic right kidney suggesting medical renal disease Electronically Signed   By: Donavan Foil M.D.   On: 03/08/2021 17:32    PROCEDURES:  Critical Care performed: No  Procedures   MEDICATIONS ORDERED IN ED: Medications  amLODipine (NORVASC) tablet 2.5 mg (has no administration in time range)  hydrochlorothiazide (HYDRODIURIL) tablet 12.5 mg (has no administration in time range)     IMPRESSION / MDM / ASSESSMENT AND PLAN / ED COURSE  I reviewed the triage vital signs and the nursing notes.                              Differential diagnosis includes, but is not limited to, cholecystitis, cholelithiasis, UTI, GI obstruction, medication side effect    Patient's diagnosis is consistent with nausea and emesis.  Patient presented to the emergency department having had nausea and emesis earlier today.  Patient has been on oral iron for the last several weeks due to iron deficiency anemia.  It appears that the patient's iron levels between oral iron supplementation and IV iron has returned to its normal range.  Patient states that she has had some GI upset from the iron supplement.  She has had approximately 1 episode of emesis a week since starting iron supplements.  She had an episode today  and the staff of her long-term care facility recommended she be seen in the emergency department.  Overall exam was reassuring.  She did have some right upper quadrant tenderness on exam though she was not complaining of right upper quadrant abdominal pain.  Ultrasound revealed cholelithiasis without cholecystitis.  Patient's labs were reviewed today.  She has leukocytosis and thrombocytosis.  This is being followed by oncology currently as they do not have a definitive explanation for this.  It is roughly the same as previous  recent labs and there will be no additional work-up performed today.  She will see oncology in 3 days.  Remainder of patient's work-up including ultrasound, urinalysis, COVID flu swab are reassuring and patient will be discharged with symptom control medicine of Zofran if she becomes nauseated while taking her iron supplement.  Any new or worsening symptoms should return to the ED.  Follow-up with oncology in 3 days..  Patient is given ED precautions to return to the ED for any worsening or new symptoms.        FINAL CLINICAL IMPRESSION(S) / ED DIAGNOSES   Final diagnoses:  RUQ pain  Nausea and vomiting, unspecified vomiting type  Thrombocytosis  Leukocytosis, unspecified type     Rx / DC Orders   ED Discharge Orders          Ordered    ondansetron (ZOFRAN-ODT) 4 MG disintegrating tablet  Every 8 hours PRN        03/08/21 1914             Note:  This document was prepared using Dragon voice recognition software and may include unintentional dictation errors.   Brynda Peon 03/08/21 1936    Nena Polio, MD 03/08/21 2224

## 2021-03-08 NOTE — ED Notes (Signed)
22g IV in left AC removed for discharge. Catheter intact. Dressing is clean, dry, intact.

## 2021-03-08 NOTE — ED Notes (Signed)
Patient was assisted to hallway bathroom via wheelchair and voided.

## 2021-03-08 NOTE — ED Notes (Signed)
Pt to ED for intermittent vomiting and diarrhea since 3 weeks ago. Denies abdominal pain. Provider has seen pt.

## 2021-03-08 NOTE — ED Triage Notes (Addendum)
Pt to ED via [facility] from [facility]. Pt reports N/V/D when she takes her morning medications. Pt states vomited after taking BP medication this morning. Pt reports symptoms x3 weeks. Pt c/o dull HA on right side. Pt gets iron infusions. Pt denies CP, SOB or cough

## 2021-03-11 ENCOUNTER — Telehealth: Payer: Self-pay | Admitting: *Deleted

## 2021-03-11 NOTE — Telephone Encounter (Signed)
I called pt's work around 11:30 am and was transferred to her voicemail at work and I left a message that Dr. Janese Banks wanted to talk about her lab results and a medicine to start her on.left my direct number to call me.  Later this evening , I tried to call her again and this time it was her cell phone and she says that she went to ER 2/24 and she vomited. Then later she was having diarrhea. She states that they told her to stop take the iron pill and 2 days now she was feeling some better. She does not have a my chart and does not do things on the phone. I spoke to her about a medicine to take because the platelets were high. She says she needs to know what to do. She can come in person and I told her that I would talk to [employer] And she will be home tomorrow- no work.

## 2021-03-12 ENCOUNTER — Other Ambulatory Visit: Payer: Self-pay

## 2021-03-12 ENCOUNTER — Encounter: Payer: Self-pay | Admitting: Oncology

## 2021-03-12 ENCOUNTER — Inpatient Hospital Stay (HOSPITAL_BASED_OUTPATIENT_CLINIC_OR_DEPARTMENT_OTHER): Payer: PPO | Admitting: Oncology

## 2021-03-12 ENCOUNTER — Telehealth: Payer: Self-pay | Admitting: *Deleted

## 2021-03-12 ENCOUNTER — Inpatient Hospital Stay: Payer: PPO | Admitting: Oncology

## 2021-03-12 VITALS — BP 121/73 | HR 96 | Temp 97.7°F | Resp 16 | Ht 65.0 in | Wt 110.0 lb

## 2021-03-12 DIAGNOSIS — D473 Essential (hemorrhagic) thrombocythemia: Secondary | ICD-10-CM

## 2021-03-12 DIAGNOSIS — Z7189 Other specified counseling: Secondary | ICD-10-CM | POA: Diagnosis not present

## 2021-03-12 DIAGNOSIS — I10 Essential (primary) hypertension: Secondary | ICD-10-CM | POA: Diagnosis not present

## 2021-03-12 DIAGNOSIS — M6281 Muscle weakness (generalized): Secondary | ICD-10-CM | POA: Diagnosis not present

## 2021-03-12 MED ORDER — HYDROXYUREA 500 MG PO CAPS: 500.0000 mg | ORAL_CAPSULE | Freq: Every day | ORAL | 3 refills | Status: DC

## 2021-03-12 NOTE — Telephone Encounter (Signed)
I called the pt last night and told her I could speak to Janese Banks about what to do with her. I got a call from where she works and she is willing to come in today and see Janese Banks. The place she is in will arrange transportation and if there are any problems they will call me back

## 2021-03-12 NOTE — Telephone Encounter (Signed)
When the pt was seen she indicated that she wants her rx sent to her pharmacy [facility]. I sent the rx to the pharmacy. The person driving her back to Assisted living said that we would need to send it to the Assisted living and they will handle it. The patient kept telling us that I have to send it to [facility]. I did send it to [facility] and then I called and cancelled  it. I had spoke to RN nancy at the assisted living and all meds for pt's are sent in to their pharmacy. It is [facility] in [place] . They have the paper that said pt must start on hydrea 500 mg daily and also 81 mg asa every day. Later I faxed the appts to Assisted living with confirmation that it went through.

## 2021-03-12 NOTE — Progress Notes (Signed)
Hematology/Oncology Consult note [facility]  Telephone:([phone] [phone]  Patient Care Team: Derinda Late, MD as PCP - General (Family Medicine)   Name of the patient: Tina Ingram  [id number]  Nov 29, 1934   Date of visit: 03/12/21  Diagnosis-essential thrombocytosis  Chief complaint/ Reason for visit-discussed results of blood work  Heme/Onc history: Patient is a 87 year old female with a past medical history significant for hypertension, osteoporosis, history of falls and C. difficile infection referred for thrombocytosis.  She was in the hospital in December 2022 for infectious colitis and was found to have platelet counts between [phone].  Looking back at her prior CBCs patient had a normal platelet count up until June 2022 although she has had a transient platelet count of 1240 in December 2020.  Over the last 6 months her platelet counts have been waxing and waning between [phone].  Patient is a resident of [place] nursing home.  Thrombocytosis persisted despite correction of iron deficiency.  JAK2 mutation positive.  BCR ABL FISH testing Did not show any BCR abl gene rearrangement.  ESR normal.    Interval history-patient is overall upset about living in [facility] and hopes to go back to her home at some point.  Otherwise no acute issues since last visit.  ECOG PS- 2 Pain scale- 0  Review of systems- Review of Systems  Constitutional:  Positive for malaise/fatigue. Negative for chills, fever and weight loss.  HENT:  Negative for congestion, ear discharge and nosebleeds.   Eyes:  Negative for blurred vision.  Respiratory:  Negative for cough, hemoptysis, sputum production, shortness of breath and wheezing.   Cardiovascular:  Negative for chest pain, palpitations, orthopnea and claudication.  Gastrointestinal:  Negative for abdominal pain, blood in stool, constipation, diarrhea, heartburn, melena, nausea and vomiting.   Genitourinary:  Negative for dysuria, flank pain, frequency, hematuria and urgency.  Musculoskeletal:  Negative for back pain, joint pain and myalgias.  Skin:  Negative for rash.  Neurological:  Negative for dizziness, tingling, focal weakness, seizures, weakness and headaches.  Endo/Heme/Allergies:  Does not bruise/bleed easily.  Psychiatric/Behavioral:  Negative for depression and suicidal ideas. The patient does not have insomnia.     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   left vertex scalp - [facility] nasal tip     Past Surgical History:  Procedure Laterality Date   APPENDECTOMY     BREAST BIOPSY Left 05/16/2014   complex sclerosing lesion    BREAST EXCISIONAL BIOPSY Left 2016   surgical exc to remove complex sclerosing lesion   BREAST LUMPECTOMY WITH RADIOACTIVE SEED LOCALIZATION Left 06/20/2014   Procedure: BREAST LUMPECTOMY WITH RADIOACTIVE SEED LOCALIZATION;  Surgeon: Erroll Luna, MD;  Location: [place];  Service: General;  Laterality: Left;   CAROTID ENDARTERECTOMY Right    CATARACT EXTRACTION W/PHACO Right 06/25/2015   Procedure: CATARACT EXTRACTION PHACO AND INTRAOCULAR LENS PLACEMENT ([place]);  Surgeon: Estill Cotta, MD;  Location: [facility];  Service: Ophthalmology;  Laterality: Right;  [place] 2.01 AP% 24.5 CDE 52.16 Fluid pack lot # [id number]   COLONOSCOPY     COLONOSCOPY WITH PROPOFOL N/A 07/07/2020  Procedure: COLONOSCOPY WITH PROPOFOL;  Surgeon: Lesly Rubenstein, MD;  Location: [facility] ENDOSCOPY;  Service: Endoscopy;  Laterality: N/A;   ESOPHAGOGASTRODUODENOSCOPY N/A 07/10/2020   Procedure: ESOPHAGOGASTRODUODENOSCOPY (EGD);  Surgeon: Jonathon Bellows, MD;   Location: [facility] ENDOSCOPY;  Service: Gastroenterology;  Laterality: N/A;   ESOPHAGOGASTRODUODENOSCOPY (EGD) WITH PROPOFOL N/A 07/06/2020   Procedure: ESOPHAGOGASTRODUODENOSCOPY (EGD) WITH PROPOFOL;  Surgeon: [name], MD;  Location: [facility] ENDOSCOPY;  Service: Endoscopy;  Laterality: N/A;   EYE SURGERY     KYPHOPLASTY N/A 01/27/2017   Procedure: [id number];  Surgeon: Hessie Knows, MD;  Location: [facility]S;  Service: Orthopedics;  Laterality: N/A;  T-9     Social History   Socioeconomic History   Marital status: Divorced    Spouse name: Not on file   Number of children: Not on file   Years of education: Not on file   Highest education level: Not on file  Occupational History   Not on file  Tobacco Use   Smoking status: Every Day    Packs/day: 1.00    Types: Cigarettes   Smokeless tobacco: Never  Vaping Use   Vaping Use: Never used  Substance and Sexual Activity   Alcohol use: No   Drug use: Never   Sexual activity: Never  Other Topics Concern   Not on file  Social History Narrative   Not on file   Social Determinants of Health   Financial Resource Strain: Not on file  Food Insecurity: Not on file  Transportation Needs: Not on file  Physical Activity: Not on file  Stress: Not on file  Social Connections: Not on file  Intimate Partner Violence: Not on file    Family History  Problem Relation Age of Onset   Breast cancer Neg Hx      Current Outpatient Medications:    acetaminophen (TYLENOL) 325 MG tablet, Take 2 tablets (650 mg total) by mouth every 6 (six) hours as needed for mild pain (or Fever >/= 101). (Patient taking differently: Take 650 mg by mouth 3 (three) times daily.), Disp: , Rfl:    amLODipine (NORVASC) 2.5 MG tablet, Take 2.5 mg by mouth daily., Disp: , Rfl:    busPIRone (BUSPAR) 7.5 MG tablet, Take 7.5 mg by mouth 2 (two) times daily., Disp: , Rfl:    cetirizine (ZYRTEC) 10 MG tablet, Take 10 mg by mouth daily., Disp: , Rfl:     hydrochlorothiazide (MICROZIDE) 12.5 MG capsule, Take 12.5 mg by mouth daily., Disp: , Rfl:    hydroxyurea (HYDREA) 500 MG capsule, Take 1 capsule (500 mg total) by mouth daily. May take with food to minimize GI side effects., Disp: 30 capsule, Rfl: 3   melatonin 3 MG TABS tablet, Take 3 mg by mouth at bedtime., Disp: , Rfl:    Multiple Vitamin (MULTIVITAMIN) tablet, Take 1 tablet by mouth daily., Disp: , Rfl:    pantoprazole (PROTONIX) 40 MG tablet, Take 40 mg by mouth daily., Disp: , Rfl:    traZODone (DESYREL) 50 MG tablet, Take 50 mg by mouth at bedtime., Disp: , Rfl:    diclofenac Sodium (VOLTAREN) 1 % GEL, Apply 1 application topically 4 (four) times daily as needed (pain)., Disp: , Rfl:    ondansetron (ZOFRAN-ODT) 4 MG disintegrating tablet, Take 1 tablet (4 mg total) by mouth every 8 (eight) hours as needed for nausea or vomiting. (Patient not taking: Reported on 03/12/2021), Disp: 20 tablet, Rfl: 0  Physical exam:  Physical Exam Constitutional:      Comments: Frail elderly  woman sitting in a wheelchair.  Appears in no acute distress  Cardiovascular:     Rate and Rhythm: Normal rate and regular rhythm.     Heart sounds: Normal heart sounds.  Pulmonary:     Effort: Pulmonary effort is normal.     Breath sounds: Normal breath sounds.  Abdominal:     General: Bowel sounds are normal.     Palpations: Abdomen is soft.  Skin:    General: Skin is warm and dry.  Neurological:     Mental Status: She is alert and oriented to person, place, and time.     CMP Latest Ref Rng & Units 03/08/2021  Glucose 70 - 99 mg/dL 91  BUN 8 - 23 mg/dL 23  Creatinine 0.44 - 1.00 mg/dL 1.47(H)  Sodium 135 - 145 mmol/L 142  Potassium 3.5 - 5.1 mmol/L 3.7  Chloride 98 - 111 mmol/L 100  CO2 22 - 32 mmol/L 30  Calcium 8.9 - 10.3 mg/dL 9.7  Total Protein 6.5 - 8.1 g/dL 6.8  Total Bilirubin 0.3 - 1.2 mg/dL 0.8  Alkaline Phos 38 - 126 U/L 79  AST 15 - 41 U/L 23  ALT 0 - 44 U/L 13   CBC Latest Ref Rng &  Units 03/08/2021  WBC 4.0 - 10.5 K/uL 18.1(H)  Hemoglobin 12.0 - 15.0 g/dL 13.0  Hematocrit 36.0 - 46.0 % 43.3  Platelets 150 - 400 K/uL 1,063(HH)    No images are attached to the encounter.  US Abdomen Limited RUQ (LIVER/GB)  Result Date: 03/08/2021 CLINICAL DATA:  Right upper quadrant pain EXAM: ULTRASOUND ABDOMEN LIMITED RIGHT UPPER QUADRANT COMPARISON:  CT 12/11/2020 FINDINGS: Gallbladder: Gallstones. Normal wall thickness. No sonographic Percell Miller is indicated Common bile duct: Diameter: 4 mm Liver: No focal lesion identified. Within normal limits in parenchymal echogenicity. Portal vein is patent on color Doppler imaging with normal direction of blood flow towards the liver. Other: Right kidney appears slightly echogenic. Cyst at the lower pole of the right kidney. IMPRESSION: 1. Cholelithiasis without sonographic evidence for acute cholecystitis or biliary dilatation 2. Echogenic right kidney suggesting medical renal disease Electronically Signed   By: Donavan Foil M.D.   On: 03/08/2021 17:32     Assessment and plan- Patient is a 87 y.o. female with new diagnosis of essential thrombocytosis  Discussed with the patient that she is found to have an elevated platelet count which has been waxing and waning between [phone].  Thrombocytosis is persistent despite correction of her iron deficiency.  Patient also has leukocytosis with mainlyNeutrophilia.  JAK2 mutation testing is positive and therefore results are consistent with essential thrombocytosis.  All the patient needs a bone marrow biopsy to differentiate between ET versus primary myelofibrosis given her age and frailty I plan to forego bone marrow biopsy at this time.  BCR ABL FISH testing was negative.  Given that patient is more than 87 years of age this makes it high risk essential thrombocytosis with JAK2 positivity.  I therefore recommend hydroxyurea at a starting dose of 500 mg daily with a goal to keep her platelets less than 400.   She will also take low-dose aspirin 81 mg daily.  We will repeat CBC with differential in 2 weeks in 4 weeks and I will see her back in 4 weeks.  We will also check von Willebrand panel in 2 weeks.  Treatment will be given with a palliative intent.  Discussed risks and benefits of Hydrea including all but not limited to risk of  infections, cytopenias, mouth ulcers dizziness and leg ulcers.  Patient understands and agrees to proceed as planned written information about Hydrea also given to the patient and nursing home informed about the start of new medication   Visit Diagnosis 1. Essential thrombocythemia ([place])   2. Goals of care, counseling/discussion      Dr. Randa Evens, MD, MPH [facility] [phone] 03/12/2021 3:59 PM

## 2021-03-13 DIAGNOSIS — I129 Hypertensive chronic kidney disease with stage 1 through stage 4 chronic kidney disease, or unspecified chronic kidney disease: Secondary | ICD-10-CM | POA: Diagnosis not present

## 2021-03-13 DIAGNOSIS — G4700 Insomnia, unspecified: Secondary | ICD-10-CM | POA: Diagnosis not present

## 2021-03-13 DIAGNOSIS — F419 Anxiety disorder, unspecified: Secondary | ICD-10-CM | POA: Diagnosis not present

## 2021-03-13 DIAGNOSIS — N189 Chronic kidney disease, unspecified: Secondary | ICD-10-CM | POA: Diagnosis not present

## 2021-03-13 DIAGNOSIS — Z87440 Personal history of urinary (tract) infections: Secondary | ICD-10-CM | POA: Diagnosis not present

## 2021-03-13 DIAGNOSIS — C4442 Squamous cell carcinoma of skin of scalp and neck: Secondary | ICD-10-CM | POA: Diagnosis not present

## 2021-03-13 DIAGNOSIS — F32A Depression, unspecified: Secondary | ICD-10-CM | POA: Diagnosis not present

## 2021-03-13 DIAGNOSIS — A419 Sepsis, unspecified organism: Secondary | ICD-10-CM | POA: Diagnosis not present

## 2021-03-13 DIAGNOSIS — K5900 Constipation, unspecified: Secondary | ICD-10-CM | POA: Diagnosis not present

## 2021-03-13 DIAGNOSIS — F17210 Nicotine dependence, cigarettes, uncomplicated: Secondary | ICD-10-CM | POA: Diagnosis not present

## 2021-03-13 DIAGNOSIS — R262 Difficulty in walking, not elsewhere classified: Secondary | ICD-10-CM | POA: Diagnosis not present

## 2021-03-13 DIAGNOSIS — M170 Bilateral primary osteoarthritis of knee: Secondary | ICD-10-CM | POA: Diagnosis not present

## 2021-03-13 DIAGNOSIS — Z9181 History of falling: Secondary | ICD-10-CM | POA: Diagnosis not present

## 2021-03-13 DIAGNOSIS — K219 Gastro-esophageal reflux disease without esophagitis: Secondary | ICD-10-CM | POA: Diagnosis not present

## 2021-03-13 DIAGNOSIS — R1310 Dysphagia, unspecified: Secondary | ICD-10-CM | POA: Diagnosis not present

## 2021-03-13 NOTE — Progress Notes (Signed)
It has been started today when you saw her. The hydrea will start tom. Per nancy at assisted living.

## 2021-03-20 DIAGNOSIS — D509 Iron deficiency anemia, unspecified: Secondary | ICD-10-CM | POA: Diagnosis not present

## 2021-03-20 DIAGNOSIS — I10 Essential (primary) hypertension: Secondary | ICD-10-CM | POA: Diagnosis not present

## 2021-03-20 NOTE — Telephone Encounter (Signed)
Spoke with patient's grandson and POA. He talked with Ms. Leas and they would like to do an [facility]. Discussed cure rate in this area may be around 80% vs Mohs which would be 98-99% cure, but they prefer ED&C given speed and ease of treatment given her current health status and difficulty with travel.  ? ?Can you please call to schedule for [facility]? Thank you! ?

## 2021-03-26 ENCOUNTER — Other Ambulatory Visit: Payer: Self-pay

## 2021-03-26 ENCOUNTER — Inpatient Hospital Stay: Payer: PPO | Attending: Oncology

## 2021-03-26 DIAGNOSIS — Z87440 Personal history of urinary (tract) infections: Secondary | ICD-10-CM | POA: Diagnosis not present

## 2021-03-26 DIAGNOSIS — C4442 Squamous cell carcinoma of skin of scalp and neck: Secondary | ICD-10-CM | POA: Diagnosis not present

## 2021-03-26 DIAGNOSIS — K219 Gastro-esophageal reflux disease without esophagitis: Secondary | ICD-10-CM | POA: Diagnosis not present

## 2021-03-26 DIAGNOSIS — I129 Hypertensive chronic kidney disease with stage 1 through stage 4 chronic kidney disease, or unspecified chronic kidney disease: Secondary | ICD-10-CM | POA: Diagnosis not present

## 2021-03-26 DIAGNOSIS — M170 Bilateral primary osteoarthritis of knee: Secondary | ICD-10-CM | POA: Diagnosis not present

## 2021-03-26 DIAGNOSIS — A419 Sepsis, unspecified organism: Secondary | ICD-10-CM | POA: Diagnosis not present

## 2021-03-26 DIAGNOSIS — D473 Essential (hemorrhagic) thrombocythemia: Secondary | ICD-10-CM | POA: Insufficient documentation

## 2021-03-26 DIAGNOSIS — F32A Depression, unspecified: Secondary | ICD-10-CM | POA: Diagnosis not present

## 2021-03-26 DIAGNOSIS — R1310 Dysphagia, unspecified: Secondary | ICD-10-CM | POA: Diagnosis not present

## 2021-03-26 DIAGNOSIS — R262 Difficulty in walking, not elsewhere classified: Secondary | ICD-10-CM | POA: Diagnosis not present

## 2021-03-26 DIAGNOSIS — F17210 Nicotine dependence, cigarettes, uncomplicated: Secondary | ICD-10-CM | POA: Diagnosis not present

## 2021-03-26 DIAGNOSIS — F419 Anxiety disorder, unspecified: Secondary | ICD-10-CM | POA: Diagnosis not present

## 2021-03-26 DIAGNOSIS — Z9181 History of falling: Secondary | ICD-10-CM | POA: Diagnosis not present

## 2021-03-26 DIAGNOSIS — K5900 Constipation, unspecified: Secondary | ICD-10-CM | POA: Diagnosis not present

## 2021-03-26 DIAGNOSIS — G4700 Insomnia, unspecified: Secondary | ICD-10-CM | POA: Diagnosis not present

## 2021-03-26 DIAGNOSIS — N189 Chronic kidney disease, unspecified: Secondary | ICD-10-CM | POA: Diagnosis not present

## 2021-03-26 LAB — CBC WITH DIFFERENTIAL/PLATELET
Abs Immature Granulocytes: 0.29 10*3/uL — ABNORMAL HIGH (ref 0.00–0.07)
Basophils Absolute: 0 10*3/uL (ref 0.0–0.1)
Basophils Relative: 0 %
Eosinophils Absolute: 0.4 10*3/uL (ref 0.0–0.5)
Eosinophils Relative: 3 %
HCT: 22.8 % — ABNORMAL LOW (ref 36.0–46.0)
Hemoglobin: 7 g/dL — ABNORMAL LOW (ref 12.0–15.0)
Immature Granulocytes: 2 %
Lymphocytes Relative: 8 %
Lymphs Abs: 1.3 10*3/uL (ref 0.7–4.0)
MCH: 27.8 pg (ref 26.0–34.0)
MCHC: 30.7 g/dL (ref 30.0–36.0)
MCV: 90.5 fL (ref 80.0–100.0)
Monocytes Absolute: 0.6 10*3/uL (ref 0.1–1.0)
Monocytes Relative: 4 %
Neutro Abs: 13.4 10*3/uL — ABNORMAL HIGH (ref 1.7–7.7)
Neutrophils Relative %: 83 %
Platelets: 2010 10*3/uL (ref 150–400)
RBC: 2.52 MIL/uL — ABNORMAL LOW (ref 3.87–5.11)
RDW: 24.3 % — ABNORMAL HIGH (ref 11.5–15.5)
Smear Review: INCREASED
WBC: 16 10*3/uL — ABNORMAL HIGH (ref 4.0–10.5)
nRBC: 0 % (ref 0.0–0.2)

## 2021-03-27 ENCOUNTER — Other Ambulatory Visit: Payer: Self-pay

## 2021-03-27 ENCOUNTER — Encounter: Payer: Self-pay | Admitting: Oncology

## 2021-03-27 ENCOUNTER — Inpatient Hospital Stay
Admission: EM | Admit: 2021-03-27 | Discharge: 2021-03-30 | DRG: 378 | Disposition: A | Discharge status: Skilled Nursing Facility | Payer: PPO | Source: Skilled Nursing Facility | Attending: Hospitalist | Admitting: Hospitalist

## 2021-03-27 ENCOUNTER — Telehealth: Payer: Self-pay | Admitting: *Deleted

## 2021-03-27 DIAGNOSIS — Z961 Presence of intraocular lens: Secondary | ICD-10-CM | POA: Diagnosis not present

## 2021-03-27 DIAGNOSIS — D473 Essential (hemorrhagic) thrombocythemia: Secondary | ICD-10-CM | POA: Diagnosis not present

## 2021-03-27 DIAGNOSIS — Z9841 Cataract extraction status, right eye: Secondary | ICD-10-CM

## 2021-03-27 DIAGNOSIS — I129 Hypertensive chronic kidney disease with stage 1 through stage 4 chronic kidney disease, or unspecified chronic kidney disease: Secondary | ICD-10-CM | POA: Diagnosis present

## 2021-03-27 DIAGNOSIS — Z86008 Personal history of in-situ neoplasm of other site: Secondary | ICD-10-CM | POA: Diagnosis not present

## 2021-03-27 DIAGNOSIS — F17210 Nicotine dependence, cigarettes, uncomplicated: Secondary | ICD-10-CM | POA: Diagnosis present

## 2021-03-27 DIAGNOSIS — K922 Gastrointestinal hemorrhage, unspecified: Secondary | ICD-10-CM | POA: Diagnosis not present

## 2021-03-27 DIAGNOSIS — Z66 Do not resuscitate: Secondary | ICD-10-CM | POA: Diagnosis present

## 2021-03-27 DIAGNOSIS — F32A Depression, unspecified: Secondary | ICD-10-CM | POA: Diagnosis present

## 2021-03-27 DIAGNOSIS — K219 Gastro-esophageal reflux disease without esophagitis: Secondary | ICD-10-CM | POA: Diagnosis present

## 2021-03-27 DIAGNOSIS — Z681 Body mass index (BMI) 19 or less, adult: Secondary | ICD-10-CM

## 2021-03-27 DIAGNOSIS — K31811 Angiodysplasia of stomach and duodenum with bleeding: Secondary | ICD-10-CM | POA: Diagnosis not present

## 2021-03-27 DIAGNOSIS — R5381 Other malaise: Secondary | ICD-10-CM | POA: Diagnosis not present

## 2021-03-27 DIAGNOSIS — R531 Weakness: Secondary | ICD-10-CM | POA: Diagnosis not present

## 2021-03-27 DIAGNOSIS — Z20822 Contact with and (suspected) exposure to covid-19: Secondary | ICD-10-CM | POA: Diagnosis present

## 2021-03-27 DIAGNOSIS — Z85828 Personal history of other malignant neoplasm of skin: Secondary | ICD-10-CM

## 2021-03-27 DIAGNOSIS — N1832 Chronic kidney disease, stage 3b: Secondary | ICD-10-CM | POA: Diagnosis present

## 2021-03-27 DIAGNOSIS — Z79899 Other long term (current) drug therapy: Secondary | ICD-10-CM | POA: Diagnosis not present

## 2021-03-27 DIAGNOSIS — F419 Anxiety disorder, unspecified: Secondary | ICD-10-CM | POA: Diagnosis not present

## 2021-03-27 DIAGNOSIS — D509 Iron deficiency anemia, unspecified: Secondary | ICD-10-CM | POA: Diagnosis not present

## 2021-03-27 DIAGNOSIS — D62 Acute posthemorrhagic anemia: Secondary | ICD-10-CM | POA: Diagnosis present

## 2021-03-27 DIAGNOSIS — K449 Diaphragmatic hernia without obstruction or gangrene: Secondary | ICD-10-CM | POA: Diagnosis not present

## 2021-03-27 DIAGNOSIS — K921 Melena: Secondary | ICD-10-CM | POA: Diagnosis present

## 2021-03-27 DIAGNOSIS — D649 Anemia, unspecified: Secondary | ICD-10-CM | POA: Diagnosis not present

## 2021-03-27 DIAGNOSIS — Q2733 Arteriovenous malformation of digestive system vessel: Secondary | ICD-10-CM | POA: Diagnosis not present

## 2021-03-27 DIAGNOSIS — D75838 Other thrombocytosis: Secondary | ICD-10-CM | POA: Diagnosis present

## 2021-03-27 DIAGNOSIS — Z7982 Long term (current) use of aspirin: Secondary | ICD-10-CM

## 2021-03-27 LAB — CBC WITH DIFFERENTIAL/PLATELET
Abs Immature Granulocytes: 0.17 10*3/uL — ABNORMAL HIGH (ref 0.00–0.07)
Basophils Absolute: 0 10*3/uL (ref 0.0–0.1)
Basophils Relative: 0 %
Eosinophils Absolute: 0.4 10*3/uL (ref 0.0–0.5)
Eosinophils Relative: 3 %
HCT: 22.5 % — ABNORMAL LOW (ref 36.0–46.0)
Hemoglobin: 6.7 g/dL — ABNORMAL LOW (ref 12.0–15.0)
Immature Granulocytes: 1 %
Lymphocytes Relative: 11 %
Lymphs Abs: 1.6 10*3/uL (ref 0.7–4.0)
MCH: 27.7 pg (ref 26.0–34.0)
MCHC: 29.8 g/dL — ABNORMAL LOW (ref 30.0–36.0)
MCV: 93 fL (ref 80.0–100.0)
Monocytes Absolute: 0.6 10*3/uL (ref 0.1–1.0)
Monocytes Relative: 4 %
Neutro Abs: 11.9 10*3/uL — ABNORMAL HIGH (ref 1.7–7.7)
Neutrophils Relative %: 81 %
Platelets: 1856 10*3/uL (ref 150–400)
RBC: 2.42 MIL/uL — ABNORMAL LOW (ref 3.87–5.11)
RDW: 24.5 % — ABNORMAL HIGH (ref 11.5–15.5)
Smear Review: INCREASED
WBC: 14.7 10*3/uL — ABNORMAL HIGH (ref 4.0–10.5)
nRBC: 0 % (ref 0.0–0.2)

## 2021-03-27 LAB — BASIC METABOLIC PANEL
Anion gap: 6 (ref 5–15)
BUN: 40 mg/dL — ABNORMAL HIGH (ref 8–23)
CO2: 29 mmol/L (ref 22–32)
Calcium: 9.2 mg/dL (ref 8.9–10.3)
Chloride: 104 mmol/L (ref 98–111)
Creatinine, Ser: 1.63 mg/dL — ABNORMAL HIGH (ref 0.44–1.00)
GFR, Estimated: 31 mL/min — ABNORMAL LOW (ref >60)
Glucose, Bld: 130 mg/dL — ABNORMAL HIGH (ref 70–99)
Potassium: 4.5 mmol/L (ref 3.5–5.1)
Sodium: 139 mmol/L (ref 135–145)

## 2021-03-27 LAB — RESP PANEL BY RT-PCR (FLU A&B, COVID) ARPGX2
Influenza A by PCR: NEGATIVE
Influenza B by PCR: NEGATIVE
SARS Coronavirus 2 by RT PCR: NEGATIVE

## 2021-03-27 LAB — PROTIME-INR
INR: 1.1 (ref 0.8–1.2)
Prothrombin Time: 14.2 seconds (ref 11.4–15.2)

## 2021-03-27 LAB — VON WILLEBRAND PANEL
Coagulation Factor VIII: 168 % — ABNORMAL HIGH (ref 56–140)
Ristocetin Co-factor, Plasma: 67 % (ref 50–200)
Von Willebrand Antigen, Plasma: 151 % (ref 50–200)

## 2021-03-27 LAB — COAG STUDIES INTERP REPORT

## 2021-03-27 LAB — PREPARE RBC (CROSSMATCH)

## 2021-03-27 MED ORDER — PANTOPRAZOLE INFUSION (NEW) - SIMPLE MED
8.0000 mg/h | INTRAVENOUS | Status: DC
  Administered 2021-03-27 – 2021-03-28 (×2): 8 mg/h via INTRAVENOUS
  Filled 2021-03-27 (×3): qty 100

## 2021-03-27 MED ORDER — SENNOSIDES-DOCUSATE SODIUM 8.6-50 MG PO TABS: 1.0000 | ORAL_TABLET | Freq: Every evening | ORAL | Status: DC | PRN

## 2021-03-27 MED ORDER — DEXTROSE IN LACTATED RINGERS 5 % IV SOLN: INTRAVENOUS | Status: DC

## 2021-03-27 MED ORDER — ACETAMINOPHEN 325 MG PO TABS
650.0000 mg | ORAL_TABLET | Freq: Four times a day (QID) | ORAL | Status: DC | PRN
  Administered 2021-03-29: 650 mg via ORAL
  Filled 2021-03-27: qty 2

## 2021-03-27 MED ORDER — ONDANSETRON HCL 4 MG/2ML IJ SOLN: 4.0000 mg | Freq: Four times a day (QID) | INTRAMUSCULAR | Status: DC | PRN

## 2021-03-27 MED ORDER — PANTOPRAZOLE 80MG IVPB - SIMPLE MED
80.0000 mg | Freq: Once | INTRAVENOUS | Status: AC
Start: 2021-03-27 — End: 2021-03-27
  Administered 2021-03-27: 80 mg via INTRAVENOUS
  Filled 2021-03-27: qty 100

## 2021-03-27 MED ORDER — ACETAMINOPHEN 650 MG RE SUPP: 650.0000 mg | Freq: Four times a day (QID) | RECTAL | Status: DC | PRN

## 2021-03-27 MED ORDER — SODIUM CHLORIDE 0.9 % IV SOLN: 10.0000 mL/h | Freq: Once | INTRAVENOUS | Status: DC

## 2021-03-27 MED ORDER — PANTOPRAZOLE SODIUM 40 MG IV SOLR: 40.0000 mg | Freq: Two times a day (BID) | INTRAVENOUS | Status: DC

## 2021-03-27 MED ORDER — ONDANSETRON HCL 4 MG PO TABS: 4.0000 mg | ORAL_TABLET | Freq: Four times a day (QID) | ORAL | Status: DC | PRN

## 2021-03-27 NOTE — H&P (Addendum)
? ?History and Physical:  ? ? ?Tina Ingram  ? ?OEV:[id number] DOB: [birth year] DOA: 03/27/2021 ? ?Referring MD/provider: Nance Pear, MD ?PCP: Derinda Late, MD  ? ?Patient coming from: [place] ? ?Chief Complaint: Abnormal labs ? ?History of Present Illness:  ? ?Tina Ingram is a 87 y.o. 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  She presented to the emergency room because of abnormal labs.  She went to her hematologist for routine lab work on 03/26/2021.  She was advised to go to the hospital after blood work reviewed hemoglobin of 7 and platelet count of 2,010. ?She also complained of intermittent dizziness and fatigue.  She has been having black stools for a while but she attributes this to iron pills.  She has chronic intermittent headache.  No vomiting or hematemesis, abdominal pain, shortness of breath or chest pain. ? ? ?ED Course:  The patient had positive heme stools in the emergency room.  He was started on blood transfusion in the emergency room. ? ?ROS:  ? ?ROS all other systems reviewed were negative. ? ?Past Medical History:  ? ?Past Medical History:  ?Diagnosis Date  ? Basal cell carcinoma 06/21/2020  ? left posterior shoulder  ? Breast mass, left   ? Essential thrombocytosis ([place]) 02/2021  ? Family history of adverse reaction to anesthesia   ? sister had some trouble waking up  ? Hypertension   ? Smoker   ? 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 left vertex scalp - MOHS [facility]/21/2020 ([facility]), nasal tip  ? ? ?Past Surgical History:  ? ?Past Surgical History:  ?Procedure Laterality Date  ? APPENDECTOMY    ? BREAST BIOPSY Left  05/16/2014  ? complex sclerosing lesion   ? BREAST EXCISIONAL BIOPSY Left 2016  ? surgical exc to remove complex sclerosing lesion  ? BREAST LUMPECTOMY WITH RADIOACTIVE SEED LOCALIZATION Left 06/20/2014  ? Procedure: BREAST LUMPECTOMY WITH RADIOACTIVE SEED LOCALIZATION;  Surgeon: Erroll Luna, MD;  Location: [place];  Service: General;  Laterality: Left;  ? CAROTID ENDARTERECTOMY Right   ? CATARACT EXTRACTION W/PHACO Right 06/25/2015  ? Procedure: CATARACT EXTRACTION PHACO AND INTRAOCULAR LENS PLACEMENT ([facility]);  Surgeon: Estill Cotta, MD;  Location: [facility];  Service: Ophthalmology;  Laterality: Right;  [place] 2.01 ?AP% 24.5 ?CDE 52.16 ?Fluid pack lot # [id number]  ? COLONOSCOPY    ? COLONOSCOPY WITH PROPOFOL N/A 07/07/2020  ? Procedure: COLONOSCOPY WITH PROPOFOL;  Surgeon: Lesly Rubenstein, MD;  Location: [facility] ENDOSCOPY;  Service: Endoscopy;  Laterality: N/A;  ? ESOPHAGOGASTRODUODENOSCOPY N/A 07/10/2020  ? Procedure: ESOPHAGOGASTRODUODENOSCOPY (EGD);  Surgeon: Jonathon Bellows, MD;  Location: [facility] ENDOSCOPY;  Service: Gastroenterology;  Laterality: N/A;  ? ESOPHAGOGASTRODUODENOSCOPY (EGD) WITH PROPOFOL N/A 07/06/2020  ? Procedure: ESOPHAGOGASTRODUODENOSCOPY (EGD) WITH PROPOFOL;  Surgeon: [name], MD;  Location: [facility] ENDOSCOPY;  Service: Endoscopy;  Laterality: N/A;  ? EYE SURGERY    ? KYPHOPLASTY N/A 01/27/2017  ? Procedure: [id number];  Surgeon: Hessie Knows, MD;  Location: [facility] ORS;  Service: Orthopedics;  Laterality: N/A;  T-9 ?  ? ? ?Social History:  ? ?Social History  ? ?Socioeconomic History  ? Marital status: Divorced  ?  Spouse name: Not on file  ?  Number of children: Not on file  ? Years of education: Not on file  ? Highest education level: Not on file  ?Occupational History  ? Not on file  ?Tobacco Use  ? Smoking status: Every Day  ?  Packs/day: 1.00  ?  Types: Cigarettes  ? Smokeless tobacco: Never  ?Vaping Use  ? Vaping Use: Never used  ?Substance and Sexual Activity  ? Alcohol use:  No  ? Drug use: Never  ? Sexual activity: Never  ?Other Topics Concern  ? Not on file  ?Social History Narrative  ? Not on file  ? ?Social Determinants of Health  ? ?Financial Resource Strain: Not on file  ?Food Insecurity: Not on file  ?Transportation Needs: Not on file  ?Physical Activity: Not on file  ?Stress: Not on file  ?Social Connections: Not on file  ?Intimate Partner Violence: Not on file  ? ? ?Allergies  ? ?Patient has no known allergies. ? ?Family history:  ? ?Family History  ?Problem Relation Age of Onset  ? Breast cancer Neg Hx   ? ? ?Current Medications:  ? ?Prior to Admission medications   ?Medication Sig Start Date End Date Taking? Authorizing Provider  ?acetaminophen (TYLENOL) 325 MG tablet Take 2 tablets (650 mg total) by mouth every 6 (six) hours as needed for mild pain (or Fever >/= 101). ?Patient taking differently: Take 650 mg by mouth 3 (three) times daily. 09/23/20   Wyvonnia Dusky, MD  ?amLODipine (NORVASC) 2.5 MG tablet Take 2.5 mg by mouth daily.    [provider]  ?aspirin EC 81 MG tablet Take 81 mg by mouth daily. Swallow whole.    [provider]  ?busPIRone (BUSPAR) 7.5 MG tablet Take 7.5 mg by mouth 2 (two) times daily. 02/27/21   [provider]  ?cetirizine (ZYRTEC) 10 MG tablet Take 10 mg by mouth daily.    [provider]  ?Cholecalciferol (VITAMIN D3) 50 MCG (2000 UT) TABS Take 1 capsule by mouth daily.    [provider]  ?diclofenac Sodium (VOLTAREN) 1 % GEL Apply 1 application topically 4 (four) times daily as needed (pain).    [provider]  ?hydrochlorothiazide (MICROZIDE) 12.5 MG capsule Take 12.5 mg by mouth daily.    [provider]  ?hydroxyurea (HYDREA) 500 MG capsule Take 1 capsule (500 mg total) by mouth daily. May take with food to minimize GI side effects. 03/12/21   Sindy Guadeloupe, MD  ?melatonin 3 MG TABS tablet Take 3 mg by mouth at bedtime.    [provider]  ?Multiple Vitamin  (MULTIVITAMIN) tablet Take 1 tablet by mouth daily.    [provider]  ?ondansetron (ZOFRAN-ODT) 4 MG disintegrating tablet Take 1 tablet (4 mg total) by mouth every 8 (eight) hours as needed for nausea or vomiting. 03/08/21   Cuthriell, Charline Bills, PA-C  ?pantoprazole (PROTONIX) 40 MG tablet Take 40 mg by mouth daily.    [provider]  ?senna-docusate (SENNA PLUS) 8.6-50 MG tablet Take 1 tablet by mouth at bedtime as needed for mild constipation.    [provider]  ?traZODone (DESYREL) 50 MG tablet Take 50 mg by mouth at bedtime. 12/25/18   [provider]  ? ? ?Physical Exam:  ? ?Vitals:  ? 03/27/21 1624 03/27/21 1630 03/27/21 1635 03/27/21 1640  ?BP: (!) 103/45 (!) 111/50 (!) 114/49 (!) 116/52  ?Pulse: 84 85 84 84  ?Resp: '20 20 17 20  '$ ?Temp: 98.4 ?F (36.9 ?C) 97.7 ?  F (36.5 ?C) 98.5 ?F (36.9 ?C) 97.8 ?F (36.6 ?C)  ?TempSrc: Oral     ?SpO2:  98% 98% 97%  ?Weight:      ?Height:      ? ? ? ?Physical Exam: ?Blood pressure (!) 116/52, pulse 84, temperature 97.8 ?F (36.6 ?C), resp. rate 20, height '5\' 5"'$  (1.651 m), weight 48.9 kg, SpO2 97 %. ?Gen: No acute distress. ?Head: Normocephalic, atraumatic. ?Eyes: Pupils equal, round and reactive to light. Extraocular movements intact.  Pale but anicteric. ?Mouth: Oropharynx reveals moist mucous membranes.  ?Neck: Supple, no thyromegaly, no lymphadenopathy, no jugular venous distention. ?Chest: Lungs are clear to auscultation with good air movement. No rales, rhonchi or wheezes.  ?CV: Heart sounds are regular with an S1, S2. No murmurs, rubs or gallops.  ?Abdomen: Soft, nontender, nondistended with normal active bowel sounds. No palpable masses. ?Extremities: Extremities are without clubbing, or cyanosis. No edema. Pedal pulses 2+.  ?Skin: Warm and dry. No rashes ?Neuro: Alert and oriented times 3; grossly nonfocal.  ?Psych: Insight is good and judgment is appropriate. Mood and affect normal. ? ? ?Data Review:  ? ? ?Labs: ?Basic Metabolic  Panel: ?Recent Labs  ?Lab 03/27/21 ?1518  ?NA 139  ?K 4.5  ?CL 104  ?CO2 29  ?GLUCOSE 130*  ?BUN 40*  ?CREATININE 1.63*  ?CALCIUM 9.2  ? ?Liver Function Tests: ?No results for input(s): AST, ALT, ALKPHOS, BIL

## 2021-03-27 NOTE — ED Triage Notes (Signed)
Pt from [facility] via [facility]. Per medic facility stated pt had labs drawn yesterday and platelets were elevated and HgB was 7, previous level was 13. Pt reports no new dizziness or fatigue.  ?

## 2021-03-27 NOTE — ED Provider Notes (Signed)
? ?[facility] ?Provider Note ? ? ? Event Date/Time  ? First MD Initiated Contact with Patient 03/27/21 1522   ?  (approximate) ? ? ?History  ? ?abnormal labs ? ? ?HPI ?Tina Ingram is a 87 y.o. female  who, per heme/onc note dated 03/12/2021 has findings consistent with essential thrombocytosis who presents to the emergency department today at the request of heme/onc office because of abnormal blood work. The patient had been seeing heme/onc because of elevated platelet counts. Blood work yesterday showed a hg of 7.0. she denies any shortness of breath or weakness. States that her stool has been black but has been black for a long time since she started on iron. She denies any abdominal pain. ? ? ?Physical Exam  ? ?Triage Vital Signs: ?ED Triage Vitals  ?Enc Vitals Group  ?   BP 03/27/21 1508 116/61  ?   Pulse Rate 03/27/21 1508 85  ?   Resp 03/27/21 1508 16  ?   Temp 03/27/21 1508 97.8 ?F (36.6 ?C)  ?   Temp Source 03/27/21 1508 Oral  ?   SpO2 03/27/21 1508 98 %  ?   Weight 03/27/21 1510 107 lb 11.2 oz (48.9 kg)  ?   Height 03/27/21 1510 '5\' 5"'$  (1.651 m)  ?   Head Circumference --   ?   Peak Flow --   ?   Pain Score 03/27/21 1509 0  ? ?Most recent vital signs: ?Vitals:  ? 03/27/21 1508  ?BP: 116/61  ?Pulse: 85  ?Resp: 16  ?Temp: 97.8 ?F (36.6 ?C)  ?SpO2: 98%  ? ? ?General: Awake, no distress.  ?CV:  Good peripheral perfusion.  ?Resp:  Normal effort.  ?Abd:  No distention.  ?Rectal:  Melanotic stool on glove. Positive GUIAC.  ? ? ?ED Results / Procedures / Treatments  ? ?Labs ?(all labs ordered are listed, but only abnormal results are displayed) ?Labs Reviewed  ?CBC WITH DIFFERENTIAL/PLATELET - Abnormal; Notable for the following components:  ?    Result Value  ? WBC 14.7 (*)   ? RBC 2.42 (*)   ? Hemoglobin 6.7 (*)   ? HCT 22.5 (*)   ? MCHC 29.8 (*)   ? RDW 24.5 (*)   ? Platelets 1,856 (*)   ? Neutro Abs 11.9 (*)   ? Abs Immature Granulocytes 0.17 (*)   ? All other components within normal  limits  ?BASIC METABOLIC PANEL - Abnormal; Notable for the following components:  ? Glucose, Bld 130 (*)   ? BUN 40 (*)   ? Creatinine, Ser 1.63 (*)   ? GFR, Estimated 31 (*)   ? All other components within normal limits  ?RESP PANEL BY RT-PCR (FLU A&B, COVID) [id number]  ?PROTIME-INR  ?TYPE AND SCREEN  ?PREPARE RBC (CROSSMATCH)  ? ? ? ?EKG ? ?None ? ? ?RADIOLOGY ?None ? ? ?PROCEDURES: ? ?Critical Care performed: No ? ?Procedures ? ?CRITICAL CARE ?Performed by: Nance Pear ? ? ?Total critical care time: 30 minutes ? ?Critical care time was exclusive of separately billable procedures and treating other patients. ? ?Critical care was necessary to treat or prevent imminent or life-threatening deterioration. ? ?Critical care was time spent personally by me on the following activities: development of treatment plan with patient and/or surrogate as well as nursing, discussions with consultants, evaluation of patient's response to treatment, examination of patient, obtaining history from patient or surrogate, ordering and performing treatments and interventions, ordering and review of laboratory studies,  ordering and review of radiographic studies, pulse oximetry and re-evaluation of patient's condition. ? ? ?MEDICATIONS ORDERED IN ED: ?Medications - No data to display ? ? ?IMPRESSION / MDM / ASSESSMENT AND PLAN / ED COURSE  ?I reviewed the triage vital signs and the nursing notes. ?             ?               ? ?Differential diagnosis includes, but is not limited to, gi bleed, hemolysis, bone marrow cause.  ? ?Patient presented to the emergency department today because of concern for abnormal blood work checked yesterday. Patient denies any symptoms of anemia. Blood work today shows worsening anemia of hgb 6.7. Patient was GUIAC positive with melenotic stool on glove. Discussed with Dr. Rogue Bussing with heme/onc. Will plan on starting iv protonix and discussed transfusion with patient. Discussed with Dr. Mal Misty with the  hospitalist team who will plan on admission.  ? ? ?FINAL CLINICAL IMPRESSION(S) / ED DIAGNOSES  ? ?Final diagnoses:  ?Gastrointestinal hemorrhage, unspecified gastrointestinal hemorrhage type  ?Anemia, unspecified type  ? ? ?Note:  This document was prepared using Dragon voice recognition software and may include unintentional dictation errors. ? ?  ?Nance Pear, MD ?03/27/21 1821 ? ?

## 2021-03-27 NOTE — Telephone Encounter (Signed)
Called [facility] and spoke to Ector. She states that every one in management is in a meeting . She took my message that I told her that her platelets is increased to 2010. Her hgb dropped to 7. According to Dr. Rogue Bussing states that when the platelets are getting higher and the hemoglobin getting lower at 7 from 13 that she can be bleeding from [facility]. Based on these labs he wants the patient to go to ER today. Hoyle Sauer will let management know. I called back to see if I need to call family and Hoyle Sauer said that she already spoke to her daughter and was told what was happening and they called EMS. ?

## 2021-03-27 NOTE — Consult Note (Signed)
? ? ?[facility] GI Inpatient Consult Note ? ? ?Kathline Magic, M.D. ? ?Reason for Consult: Reported melena, anemia ?  ?Attending Requesting Consult: Jennye Boroughs, M.D. ? ?Outpatient Primary Physician: Derinda Late, M.D. ? ?History of Present Illness: ?Tina Ingram is a 87 y.o. female presenting to the emergency after referral from the cancer center (Dr. Randa Evens) for drop in Hgb. Patient had been undergoing serial IV infusions of iron for persistent iron deficiency anemia of an unknown cause, initially thought to be from occult gastrointestinal bleeding.  Extensive work-up in June 2022 including 2 EGDs, one colonoscopy and 1 capsule endoscopy of the small intestine failed to localize a source for iron deficiency anemia.  ?Interestingly, the patient maintains she NEVER had black stools prior to starting iron therapy, though previous evaluations suggested melenic stool predated oral iron therapy, the latter of which can also turn the stool black. Patient denies abdominal pain, hemetemesis, hematochezia or change in caliber of the stool.  ?Patient denies more frequent bowel movements at home recently, and says she has only one "dark stool" daily. She has been tapering off her oral iron over the past month due to her last Hgb being "ok". ?Noted as well is a persistently elevated platelet level without concurrent increase in ferritin to argue against thrombocytosis being an acute phase reactant, but may be possibly longer standing, chronic process. ?Dr. Janese Banks (hematology) performed a blood smear on 03/08/2021 which was read by pathologist Dr. Quay Burow showing unremarkable WBC and RBC morphology but showing a JAK2 mutation. BCR Abl TESTING WAS PENDING.  Thrombocytosis was also present. There appears to be a workup in process to rule out myeloproliferative process by my review. ?  ? ?Past Medical History:  ?Past Medical History:  ?Diagnosis Date  ? Basal cell carcinoma 06/21/2020  ? left posterior  shoulder  ? Breast mass, left   ? Essential thrombocytosis ([place]) 02/2021  ? Family history of adverse reaction to anesthesia   ? sister had some trouble waking up  ? Hypertension   ? Smoker   ? 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 left vertex scalp - MOHS 08/21/2020 ([facility]), nasal tip  ?  ?Problem List: ?Patient Active Problem List  ? Diagnosis Date Noted  ? Severe anemia 03/27/2021  ? Essential thrombocytosis ([place]) 03/27/2021  ? Malnutrition of moderate degree 12/13/2020  ? Infectious colitis 12/11/2020  ? UTI (urinary tract infection) 09/18/2020  ? Fall at home, initial encounter 09/18/2020  ? Generalized weakness 09/18/2020  ? GERD without esophagitis 09/18/2020  ? Skin cancer 09/10/2020  ? Symptomatic anemia   ? Iron deficiency anemia due to chronic blood loss   ? Hypokalemia   ? AKI (acute kidney injury) ([place])   ? Essential hypertension   ? Tobacco dependence   ? GI bleed 07/05/2020  ?  ?Past Surgical History: ?Past Surgical History:  ?Procedure Laterality Date  ? APPENDECTOMY    ? BREAST BIOPSY Left 05/16/2014  ? complex sclerosing lesion   ? BREAST EXCISIONAL BIOPSY Left 2016  ? surgical exc to remove complex sclerosing lesion  ? BREAST LUMPECTOMY WITH RADIOACTIVE SEED LOCALIZATION Left 06/20/2014  ? Procedure: BREAST LUMPECTOMY WITH RADIOACTIVE SEED LOCALIZATION;  Surgeon: Erroll Luna, MD;  Location: [place];  Service: General;  Laterality: Left;  ? CAROTID ENDARTERECTOMY Right   ? CATARACT EXTRACTION W/PHACO Right 06/25/2015  ? Procedure: CATARACT EXTRACTION PHACO AND INTRAOCULAR LENS PLACEMENT (IOC);  Surgeon: Estill Cotta, MD;  Location: [facility]  ORS;  Service: Ophthalmology;  Laterality: Right;  [place] 2.01 ?AP% 24.5 ?CDE 52.16 ?Fluid pack lot # [id number]  ? COLONOSCOPY    ? COLONOSCOPY WITH PROPOFOL N/A 07/07/2020  ? Procedure: COLONOSCOPY WITH PROPOFOL;  Surgeon: Lesly Rubenstein, MD;  Location: [facility] ENDOSCOPY;  Service: Endoscopy;  Laterality: N/A;  ?  ESOPHAGOGASTRODUODENOSCOPY N/A 07/10/2020  ? Procedure: ESOPHAGOGASTRODUODENOSCOPY (EGD);  Surgeon: Jonathon Bellows, MD;  Location: [facility] ENDOSCOPY;  Service: Gastroenterology;  Laterality: N/A;  ? ESOPHAGOGASTRODUODENOSCOPY (EGD) WITH PROPOFOL N/A 07/06/2020  ? Procedure: ESOPHAGOGASTRODUODENOSCOPY (EGD) WITH PROPOFOL;  Surgeon: [name], MD;  Location: [facility] ENDOSCOPY;  Service: Endoscopy;  Laterality: N/A;  ? EYE SURGERY    ? KYPHOPLASTY N/A 01/27/2017  ? Procedure: [id number];  Surgeon: Hessie Knows, MD;  Location: [facility] ORS;  Service: Orthopedics;  Laterality: N/A;  T-9 ?  ?  ?Allergies: ?No Known Allergies  ?Home Medications: ?(Not in a hospital admission) ? ?Home medication reconciliation was completed with the patient.  ? ?Scheduled Inpatient Medications: ?  ? [START ON 03/31/2021] pantoprazole  40 mg Intravenous Q12H  ? ? ?Continuous Inpatient Infusions: ?  ? sodium chloride Stopped (03/27/21 1554)  ? [START ON 03/28/2021] dextrose 5% lactated ringers    ? pantoprazole 8 mg/hr (03/27/21 1633)  ? ? ?PRN Inpatient Medications:  ? ? ?Family History: ?family history is not on file.  ? ?GI Family History: Negative. ? ?Social History:  ? reports that she has been smoking cigarettes. She has been smoking an average of 1 pack per day. She has never used smokeless tobacco. She reports that she does not drink alcohol and does not use drugs. The patient denies ETOH, tobacco, or drug use.  ? ? ?Review of Systems: Review of Systems - History obtained from the patient and sisters. ?General ROS: positive for  - fatigue ?negative for - fever, night sweats, sleep disturbance, or weight loss ?Psychological ROS: negative ?Ophthalmic ROS: negative ?ENT ROS: negative ?Allergy and Immunology ROS: negative ?Hematological and Lymphatic ROS: negative ?Breast ROS: positive for - history of breast cancer s/p lumpectomy and radiation. ?Respiratory ROS: no cough, shortness of breath, or wheezing ?Cardiovascular ROS: no chest pain or  dyspnea on exertion ?Genito-Urinary ROS: no dysuria, trouble voiding, or hematuria ?Musculoskeletal ROS: positive for - gait disturbance ?negative for - muscle pain ?Neurological ROS: no TIA or stroke symptoms ?Dermatological ROS: negative ? ?Physical Examination: ?BP (!) 116/52   Pulse 84   Temp 97.8 ?F (36.6 ?C)   Resp 20   Ht '5\' 5"'$  (1.651 m)   Wt 48.9 kg   SpO2 97%   BMI 17.92 kg/m?  ?Physical Exam ?Constitutional:   ?   General: She is not in acute distress. ?   Appearance: Normal appearance. She is normal weight. She is not toxic-appearing or diaphoretic.  ?HENT:  ?   Head: Normocephalic and atraumatic.  ?   Nose: Nose normal.  ?Eyes:  ?   Extraocular Movements: Extraocular movements intact.  ?   Conjunctiva/sclera: Conjunctivae normal.  ?   Pupils: Pupils are equal, round, and reactive to light.  ?Cardiovascular:  ?   Rate and Rhythm: Normal rate.  ?   Pulses: Normal pulses.  ?   Heart sounds: Normal heart sounds. No murmur heard. ?  No gallop.  ?Pulmonary:  ?   Effort: Pulmonary effort is normal.  ?   Breath sounds: Normal breath sounds.  ?Abdominal:  ?   General: There is no distension.  ?   Palpations: Abdomen is soft.  There is no mass.  ?   Tenderness: There is no abdominal tenderness. There is no guarding or rebound.  ?   Hernia: No hernia is present.  ?Musculoskeletal:     ?   General: Normal range of motion.  ?Skin: ?   General: Skin is warm and dry.  ?Neurological:  ?   General: No focal deficit present.  ?   Mental Status: She is alert.  ?Psychiatric:     ?   Mood and Affect: Mood normal.     ?   Thought Content: Thought content normal.     ?   Judgment: Judgment normal.  ? ? ?Data: ?Lab Results  ?Component Value Date  ? WBC 14.7 (H) 03/27/2021  ? HGB 6.7 (L) 03/27/2021  ? HCT 22.5 (L) 03/27/2021  ? MCV 93.0 03/27/2021  ? PLT 1,856 ([place]) 03/27/2021  ? ?Recent Labs  ?Lab 03/26/21 ?1333 03/27/21 ?1518  ?HGB 7.0* 6.7*  ? ?Lab Results  ?Component Value Date  ? NA 139 03/27/2021  ? K 4.5 03/27/2021   ? CL 104 03/27/2021  ? CO2 29 03/27/2021  ? BUN 40 (H) 03/27/2021  ? CREATININE 1.63 (H) 03/27/2021  ? ?Lab Results  ?Component Value Date  ? ALT 13 03/08/2021  ? AST 23 03/08/2021  ? ALKPHOS 79 03/08/2021  ? BILITOT 0.8 02/24/

## 2021-03-28 ENCOUNTER — Encounter: Payer: Self-pay | Admitting: Oncology

## 2021-03-28 ENCOUNTER — Inpatient Hospital Stay: Payer: PPO | Admitting: Anesthesiology

## 2021-03-28 ENCOUNTER — Encounter
Admission: EM | Disposition: A | Discharge status: Skilled Nursing Facility | Payer: Self-pay | Source: Skilled Nursing Facility | Attending: Hospitalist

## 2021-03-28 DIAGNOSIS — D649 Anemia, unspecified: Secondary | ICD-10-CM

## 2021-03-28 HISTORY — PX: ESOPHAGOGASTRODUODENOSCOPY (EGD) WITH PROPOFOL: SHX5813

## 2021-03-28 LAB — TYPE AND SCREEN
ABO/RH(D): O POS
Antibody Screen: NEGATIVE
Unit division: 0
Unit division: 0

## 2021-03-28 LAB — CBC
HCT: 26.5 % — ABNORMAL LOW (ref 36.0–46.0)
Hemoglobin: 8.8 g/dL — ABNORMAL LOW (ref 12.0–15.0)
MCH: 28.4 pg (ref 26.0–34.0)
MCHC: 33.2 g/dL (ref 30.0–36.0)
MCV: 85.5 fL (ref 80.0–100.0)
Platelets: 1479 10*3/uL (ref 150–400)
RBC: 3.1 MIL/uL — ABNORMAL LOW (ref 3.87–5.11)
RDW: 22.8 % — ABNORMAL HIGH (ref 11.5–15.5)
WBC: 14.4 10*3/uL — ABNORMAL HIGH (ref 4.0–10.5)
nRBC: 0 % (ref 0.0–0.2)

## 2021-03-28 LAB — BASIC METABOLIC PANEL
Anion gap: 7 (ref 5–15)
BUN: 44 mg/dL — ABNORMAL HIGH (ref 8–23)
CO2: 27 mmol/L (ref 22–32)
Calcium: 8.8 mg/dL — ABNORMAL LOW (ref 8.9–10.3)
Chloride: 106 mmol/L (ref 98–111)
Creatinine, Ser: 1.3 mg/dL — ABNORMAL HIGH (ref 0.44–1.00)
GFR, Estimated: 41 mL/min — ABNORMAL LOW (ref >60)
Glucose, Bld: 98 mg/dL (ref 70–99)
Potassium: 4.3 mmol/L (ref 3.5–5.1)
Sodium: 140 mmol/L (ref 135–145)

## 2021-03-28 LAB — BPAM RBC
Blood Product Expiration Date: 202303162359
Blood Product Expiration Date: 202304112359
ISSUE DATE / TIME: 202303151617
ISSUE DATE / TIME: 202303152019
Unit Type and Rh: 5100
Unit Type and Rh: 9500

## 2021-03-28 LAB — HEMOGLOBIN AND HEMATOCRIT, BLOOD
HCT: 26 % — ABNORMAL LOW (ref 36.0–46.0)
Hemoglobin: 8.3 g/dL — ABNORMAL LOW (ref 12.0–15.0)

## 2021-03-28 SURGERY — ESOPHAGOGASTRODUODENOSCOPY (EGD) WITH PROPOFOL
Anesthesia: General

## 2021-03-28 MED ORDER — TRAZODONE HCL 50 MG PO TABS
50.0000 mg | ORAL_TABLET | Freq: Every day | ORAL | Status: DC
Start: 2021-03-28 — End: 2021-03-30
  Administered 2021-03-28 – 2021-03-29 (×2): 50 mg via ORAL
  Filled 2021-03-28 (×2): qty 1

## 2021-03-28 MED ORDER — PROPOFOL 10 MG/ML IV BOLUS
INTRAVENOUS | Status: DC | PRN
  Administered 2021-03-28: 50 mg via INTRAVENOUS

## 2021-03-28 MED ORDER — LIDOCAINE HCL (CARDIAC) PF 100 MG/5ML IV SOSY
PREFILLED_SYRINGE | INTRAVENOUS | Status: DC | PRN
  Administered 2021-03-28: 50 mg via INTRAVENOUS

## 2021-03-28 MED ORDER — HYDROXYUREA 500 MG PO CAPS
1000.0000 mg | ORAL_CAPSULE | Freq: Two times a day (BID) | ORAL | Status: AC
  Administered 2021-03-28 – 2021-03-29 (×3): 1000 mg via ORAL
  Filled 2021-03-28 (×4): qty 2

## 2021-03-28 MED ORDER — BUSPIRONE HCL 15 MG PO TABS
7.5000 mg | ORAL_TABLET | Freq: Two times a day (BID) | ORAL | Status: DC
Start: 2021-03-28 — End: 2021-03-30
  Administered 2021-03-28 – 2021-03-30 (×4): 7.5 mg via ORAL
  Filled 2021-03-28 (×5): qty 1

## 2021-03-28 MED ORDER — PROPOFOL 500 MG/50ML IV EMUL
INTRAVENOUS | Status: DC | PRN
Start: 2021-03-28 — End: 2021-03-28
  Administered 2021-03-28: 150 ug/kg/min via INTRAVENOUS

## 2021-03-28 MED ORDER — SODIUM CHLORIDE 0.9 % IV SOLN: INTRAVENOUS | Status: DC

## 2021-03-28 NOTE — Anesthesia Procedure Notes (Signed)
Date/Time: 03/28/2021 2:10 PM ?Performed by: Johnna Acosta, CRNA ?Pre-anesthesia Checklist: Patient identified, Emergency Drugs available, Patient being monitored, Suction available and Timeout performed ?Patient Re-evaluated:Patient Re-evaluated prior to induction ?Oxygen Delivery Method: Nasal cannula ?Preoxygenation: Pre-oxygenation with 100% oxygen ?Induction Type: IV induction ? ? ? ? ?

## 2021-03-28 NOTE — Plan of Care (Signed)
Patient sleeping between care. Aox3 but forgetful at times. Denies pain. No bowel movement overnight. Plan of care reviewed with patient. NPO since midnight for EGD today. Bed alarm on. ? ? ?Problem: Education: ?Goal: Knowledge of General Education information will improve ?Description: Including pain rating scale, medication(s)/side effects and non-pharmacologic comfort measures ?Outcome: Progressing ?  ?Problem: Health Behavior/Discharge Planning: ?Goal: Ability to manage health-related needs will improve ?Outcome: Progressing ?  ?Problem: Clinical Measurements: ?Goal: Ability to maintain clinical measurements within normal limits will improve ?Outcome: Progressing ?Goal: Will remain free from infection ?Outcome: Progressing ?Goal: Diagnostic test results will improve ?Outcome: Progressing ?Goal: Respiratory complications will improve ?Outcome: Progressing ?Goal: Cardiovascular complication will be avoided ?Outcome: Progressing ?  ?Problem: Activity: ?Goal: Risk for activity intolerance will decrease ?Outcome: Progressing ?  ?Problem: Nutrition: ?Goal: Adequate nutrition will be maintained ?Outcome: Progressing ?  ?Problem: Coping: ?Goal: Level of anxiety will decrease ?Outcome: Progressing ?  ?Problem: Elimination: ?Goal: Will not experience complications related to bowel motility ?Outcome: Progressing ?Goal: Will not experience complications related to urinary retention ?Outcome: Progressing ?  ?Problem: Pain Managment: ?Goal: General experience of comfort will improve ?Outcome: Progressing ?  ?Problem: Safety: ?Goal: Ability to remain free from injury will improve ?Outcome: Progressing ?  ?Problem: Skin Integrity: ?Goal: Risk for impaired skin integrity will decrease ?Outcome: Progressing ?  ?

## 2021-03-28 NOTE — Consult Note (Signed)
[place] ?CONSULT NOTE ? ?Patient Care Team: ?Derinda Late, MD as PCP - General (Family Medicine) ? ?CHIEF COMPLAINTS/PURPOSE OF CONSULTATION: Severe thrombocytosis ? ?HISTORY OF PRESENTING ILLNESS:  ?Tina Ingram 87 y.o.  female with recently diagnosed essential thrombocytosis JAK-2 positive is currently admitted to hospital for extreme thrombocytosis/severe anemia. ? ?Patient was recently diagnosed in end of February with JAK2 positive essential thrombocytosis-on peripheral blood work-up.  Patient was started on Hydrea 500 mg once a day.  And also started on low-dose aspirin 80 mg a day. ? ?However incidentally approximate 2 weeks later-patient noted to have platelet count of about 2 million; also hemoglobin dropping to 7.  On evaluation in the emergency room-patient noted to have black-colored stool. ? Oral Iron.  ? ?Patient has been started on IV Protonix drip.  Currently awaiting evaluation with GI.  Patient's status post blood transfusion hemoglobin improved from 6.7-8.8.  Otherwise patient is clinically stable ? ?Review of Systems  ?Constitutional:  Positive for malaise/fatigue. Negative for chills, diaphoresis, fever and weight loss.  ?HENT:  Negative for nosebleeds and sore throat.   ?Eyes:  Negative for double vision.  ?Respiratory:  Negative for cough, hemoptysis, sputum production, shortness of breath and wheezing.   ?Cardiovascular:  Negative for chest pain, palpitations, orthopnea and leg swelling.  ?Gastrointestinal:  Negative for abdominal pain, blood in stool, constipation, diarrhea, heartburn, melena, nausea and vomiting.  ?Genitourinary:  Negative for dysuria, frequency and urgency.  ?Musculoskeletal:  Negative for back pain and joint pain.  ?Skin: Negative.  Negative for itching and rash.  ?Neurological:  Negative for dizziness, tingling, focal weakness, weakness and headaches.  ?Endo/Heme/Allergies:  Does not bruise/bleed easily.  ?Psychiatric/Behavioral:  Negative for  depression. The patient is not nervous/anxious and does not have insomnia.    ? ?MEDICAL HISTORY:  ?Past Medical History:  ?Diagnosis Date  ? Basal cell carcinoma 06/21/2020  ? left posterior shoulder  ? Breast mass, left   ? Essential thrombocytosis ([place]) 02/2021  ? Family history of adverse reaction to anesthesia   ? sister had some trouble waking up  ? Hypertension   ? Smoker   ? 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 left vertex scalp - MOHS 08/21/2020 ([facility]), nasal tip  ? ? ?SURGICAL HISTORY: ?Past Surgical History:  ?Procedure Laterality Date  ? APPENDECTOMY    ? BREAST BIOPSY Left 05/16/2014  ? complex sclerosing lesion   ? BREAST EXCISIONAL BIOPSY Left 2016  ? surgical exc to remove complex sclerosing lesion  ? BREAST LUMPECTOMY WITH RADIOACTIVE SEED LOCALIZATION Left 06/20/2014  ? Procedure: BREAST LUMPECTOMY WITH RADIOACTIVE SEED LOCALIZATION;  Surgeon: Erroll Luna, MD;  Location: [place];  Service: General;  Laterality: Left;  ? CAROTID ENDARTERECTOMY Right   ? CATARACT EXTRACTION W/PHACO Right 06/25/2015  ? Procedure: CATARACT EXTRACTION PHACO AND INTRAOCULAR LENS PLACEMENT (IOC);  Surgeon: Estill Cotta, MD;  Location: [facility] ORS;  Service: Ophthalmology;  Laterality: Right;  [place] 2.01 ?AP% 24.5 ?CDE 52.16 ?Fluid pack lot # [id number]  ? COLONOSCOPY    ? COLONOSCOPY WITH PROPOFOL N/A 07/07/2020  ? Procedure: COLONOSCOPY WITH PROPOFOL;  Surgeon: Lesly Rubenstein, MD;  Location: [facility] ENDOSCOPY;  Service: Endoscopy;  Laterality: N/A;  ? ESOPHAGOGASTRODUODENOSCOPY N/A 07/10/2020  ? Procedure: ESOPHAGOGASTRODUODENOSCOPY (EGD);  Surgeon: Jonathon Bellows, MD;  Location: [facility] ENDOSCOPY;  Service: Gastroenterology;  Laterality: N/A;  ? ESOPHAGOGASTRODUODENOSCOPY (EGD) WITH PROPOFOL N/A 07/06/2020  ? Procedure: ESOPHAGOGASTRODUODENOSCOPY (EGD) WITH PROPOFOL;  Surgeon: [name], MD;  Location: [facility] ENDOSCOPY;  Service: Endoscopy;  Laterality: N/A;  ? EYE SURGERY    ?  KYPHOPLASTY N/A 01/27/2017  ? Procedure: [id number];  Surgeon: Hessie Knows, MD;  Location: [facility] ORS;  Service: Orthopedics;  Laterality: N/A;  T-9 ?  ? ? ?SOCIAL HISTORY: ?Social History  ? ?Socioeconomic History  ? Marital status: Divorced  ?  Spouse name: Not on file  ? Number of children: Not on file  ? Years of education: Not on file  ? Highest education level: Not on file  ?Occupational History  ? Not on file  ?Tobacco Use  ? Smoking status: Every Day  ?  Packs/day: 1.00  ?  Types: Cigarettes  ? Smokeless tobacco: Never  ?Vaping Use  ? Vaping Use: Never used  ?Substance and Sexual Activity  ? Alcohol use: No  ? Drug use: Never  ? Sexual activity: Never  ?Other Topics Concern  ? Not on file  ?Social History Narrative  ? Not on file  ? ?Social Determinants of Health  ? ?Financial Resource Strain: Not on file  ?Food Insecurity: Not on file  ?Transportation Needs: Not on file  ?Physical Activity: Not on file  ?Stress: Not on file  ?Social Connections: Not on file  ?Intimate Partner Violence: Not on file  ? ? ?FAMILY HISTORY: ?Family History  ?Problem Relation Age of Onset  ? Breast cancer Neg Hx   ? ? ?ALLERGIES:  has No Known Allergies. ? ?MEDICATIONS:  ?Current Facility-Administered Medications  ?Medication Dose Route Frequency Provider Last Rate Last Admin  ? [MAR Hold] 0.9 %  sodium chloride infusion  10 mL/hr Intravenous Once Jennye Boroughs, MD   Held at 03/27/21 1554  ? 0.9 %  sodium chloride infusion   Intravenous Continuous Efrain Sella, MD 20 mL/hr at 03/28/21 1406 Continued from Pre-op at 03/28/21 1406  ? [MAR Hold] acetaminophen (TYLENOL) tablet 650 mg  650 mg Oral Q6H PRN Jennye Boroughs, MD      ? Or  ? [MAR Hold] acetaminophen (TYLENOL) suppository 650 mg  650 mg Rectal Q6H PRN Jennye Boroughs, MD      ? dextrose 5 % in lactated ringers infusion   Intravenous Continuous Jennye Boroughs, MD 50 mL/hr at 03/28/21 0128 New Bag at 03/28/21 0128  ? [MAR Hold] hydroxyurea (HYDREA) capsule 1,000 mg   1,000 mg Oral BID Charlaine Dalton R, MD   1,000 mg at 03/28/21 1000  ? [MAR Hold] ondansetron (ZOFRAN) tablet 4 mg  4 mg Oral Q6H PRN Jennye Boroughs, MD      ? Or  ? [MAR Hold] ondansetron (ZOFRAN) injection 4 mg  4 mg Intravenous Q6H PRN Jennye Boroughs, MD      ? Doug Sou Hold] pantoprazole (PROTONIX) injection 40 mg  40 mg Intravenous Q12H Jennye Boroughs, MD      ? pantoprozole (PROTONIX) 80 mg /NS 100 mL infusion  8 mg/hr Intravenous Continuous Jennye Boroughs, MD 10 mL/hr at 03/28/21 0202 8 mg/hr at 03/28/21 0202  ? [MAR Hold] senna-docusate (Senokot-S) tablet 1 tablet  1 tablet Oral QHS PRN Jennye Boroughs, MD      ? ?Facility-Administered Medications Ordered in Other Encounters  ?Medication Dose Route Frequency Provider Last Rate Last Admin  ? lidocaine (cardiac) 100 mg/70m (XYLOCAINE) injection 2%   Intravenous Anesthesia Intra-op MJohnna Acosta CRNA   50 mg at 03/28/21 1411  ? propofol (DIPRIVAN) 10 mg/mL bolus/IV push   Intravenous Anesthesia Intra-op MJohnna Acosta CRNA   50 mg at 03/28/21 1411  ?  propofol (DIPRIVAN) 500 MG/50ML infusion   Intravenous Continuous PRN Johnna Acosta, CRNA 31.746 mL/hr at 03/28/21 1422 110 mcg/kg/min at 03/28/21 1422  ? ? ?  ?. ? ?PHYSICAL EXAMINATION: ? ?Vitals:  ? 03/28/21 1131 03/28/21 1340  ?BP: 120/64 128/69  ?Pulse: 80 80  ?Resp: 17 16  ?Temp: 99.2 ?F (37.3 ?C) 98.5 ?F (36.9 ?C)  ?SpO2:    ? ?Filed Weights  ? 03/27/21 1510 03/28/21 1340  ?Weight: 107 lb 11.2 oz (48.9 kg) 106 lb (48.1 kg)  ? ? ?Physical Exam ?Vitals and nursing note reviewed.  ?HENT:  ?   Head: Normocephalic and atraumatic.  ?   Mouth/Throat:  ?   Pharynx: Oropharynx is clear.  ?Eyes:  ?   Extraocular Movements: Extraocular movements intact.  ?   Pupils: Pupils are equal, round, and reactive to light.  ?Cardiovascular:  ?   Rate and Rhythm: Normal rate and regular rhythm.  ?Pulmonary:  ?   Comments: Decreased breath sounds bilaterally.  ?Abdominal:  ?   Palpations: Abdomen is soft.   ?Musculoskeletal:     ?   General: Normal range of motion.  ?   Cervical back: Normal range of motion.  ?Skin: ?   General: Skin is warm.  ?Neurological:  ?   General: No focal deficit present.  ?   Mental Status: She is

## 2021-03-28 NOTE — Transfer of Care (Signed)
Immediate Anesthesia Transfer of Care Note ? ?Patient: Tina Ingram ? ?Procedure(s) Performed: ESOPHAGOGASTRODUODENOSCOPY (EGD) WITH PROPOFOL ? ?Patient Location: PACU ? ?Anesthesia Type:General ? ?Level of Consciousness: sedated and drowsy ? ?Airway & Oxygen Therapy: Patient Spontanous Breathing and Patient connected to nasal cannula oxygen ? ?Post-op Assessment: Report given to RN and Post -op Vital signs reviewed and stable ? ?Post vital signs: Reviewed and stable ? ?Last Vitals:  ?Vitals Value Taken Time  ?BP 160/77 03/28/21 1436  ?Temp    ?Pulse 94 03/28/21 1436  ?Resp 17 03/28/21 1436  ?SpO2 96 % 03/28/21 1436  ? ? ?Last Pain:  ?Vitals:  ? 03/28/21 1340  ?TempSrc: Temporal  ?PainSc: 0-No pain  ?   ? ?  ? ?Complications: No notable events documented. ?

## 2021-03-28 NOTE — Anesthesia Preprocedure Evaluation (Addendum)
Anesthesia Evaluation  ?Patient identified by MRN, date of birth, ID band ?Patient awake ? ? ? ?Reviewed: ?Allergy & Precautions, NPO status , Patient's Chart, lab work & pertinent test results ? ?Airway ?Mallampati: III ? ?TM Distance: >3 FB ?Neck ROM: Full ? ? ? Dental ? ?(+) Missing ?  ?Pulmonary ?Current Smoker and Patient abstained from smoking.,  ?  ?Pulmonary exam normal ?breath sounds clear to auscultation ? ? ? ? ? ? Cardiovascular ?Exercise Tolerance: Poor ?hypertension, Pt. on medications ? ?Rhythm:Regular Rate:Normal ?+ Systolic murmurs ?ECG: NSR, rate 85 ?  ?Neuro/Psych ?PSYCHIATRIC DISORDERS Anxiety negative neurological ROS ?   ? GI/Hepatic ?negative GI ROS, Neg liver ROS,   ?Endo/Other  ?negative endocrine ROS ? Renal/GU ?Renal InsufficiencyRenal disease  ? ?  ?Musculoskeletal ?negative musculoskeletal ROS ?(+)  ? Abdominal ?Normal abdominal exam  (+)   ?Peds ? Hematology ? ?(+) Blood dyscrasia, anemia , Thrombocytosis being evaluated ? ?Iron deficiency anemia due to chronic blood loss   ?Anesthesia Other Findings ? ? Reproductive/Obstetrics ? ?  ? ? ? ? ? ? ? ? ? ? ? ? ? ?  ?  ? ? ? ? ? ? ? ?Anesthesia Physical ? ?Anesthesia Plan ? ?ASA: 3 ? ?Anesthesia Plan: General  ? ?Post-op Pain Management: Minimal or no pain anticipated  ? ?Induction:  ? ?PONV Risk Score and Plan: 2 and Treatment may vary due to age or medical condition, Propofol infusion and TIVA ? ?Airway Management Planned: Natural Airway and Simple Face Mask ? ?Additional Equipment:  ? ?Intra-op Plan:  ? ?Post-operative Plan:  ? ?Informed Consent: I have reviewed the patients History and Physical, chart, labs and discussed the procedure including the risks, benefits and alternatives for the proposed anesthesia with the patient or authorized representative who has indicated his/her understanding and acceptance.  ? ? ? ?Dental advisory given ? ?Plan Discussed with: CRNA and Anesthesiologist ? ?Anesthesia  Plan Comments:   ? ? ? ? ? ?Anesthesia Quick Evaluation ? ?

## 2021-03-28 NOTE — Assessment & Plan Note (Addendum)
#  87 year old female patient with recently diagnosed JAK-2 positive essential thrombocytosis on Hydrea 500 mg once a day is currently admitted to the hospital for severe anemia hemoglobin /severe thrombocytosis  ? ?#Severe acute anemia hemoglobin~7 [2 weeks prior hemoglobin 13]; sudden drop because of acute GI bleed-s/p EGD on 3/16.  S/p Protonix drip; s/p PRBC transfusion.  Hemoglobin stable at 8.3. ? ?#Extreme thrombocytosis-2 million [appx 2 weeks ago ~900]-patient currently on Hydrea 500 mg twice daily ; no evidence of secondary von Willebrand disease.  Continue to hold off aspirin or any blood thinners at this time.  Patient is high risk of bleeding rather than clotting.  Continue Hydrea 500 mg twice daily at discharge.  Patient will get labs in approximately 1 week.  We will follow-up with MD in 2 weeks labs.  Discussed with cancer center nurse, Judeen Hammans for Dr. Janese Banks. ? ?#If patient's hemoglobin is stable patient discharged on 3/18.  Discussed with Dr. Billie Ruddy. ?

## 2021-03-28 NOTE — Progress Notes (Signed)
?PROGRESS NOTE ? ? ? EDWYNA Ingram  IZT:[id number] DOB: August 10, 1934 DOA: 03/27/2021 ?PCP: Derinda Late, MD  ?157A/157A-AA ? ? ?Assessment & Plan: ?  ?Principal Problem: ?  Severe anemia ?Active Problems: ?  Essential thrombocytosis ([place]) ? ? ?Tina Ingram is a 87 y.o. 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  She presented to the emergency room because of abnormal labs.  She went to her hematologist for routine lab work on 03/26/2021.  She was advised to go to the hospital after blood work reviewed hemoglobin of 7 and platelet count of 2,010. ?She also complained of intermittent dizziness and fatigue.  She has been having black stools for a while but she attributes this to iron pills. ?  ?Acute blood loss anemia 2/2 ?Acute upper GI bleed ?--Hgb 7.0 on presentation, a drop from 13 on 03/08/21. ?--s/p 2 units of packed red blood cells.  ?Plan: ?--EGD today which found actively bleeding duodenal AVM (brisk bleed) that was treated with APC cautery successfully. ?--clear liquid diet for now ?--Monitor Hgb and transfuse to keep Hgb >7 ?--cont PPI gtt ?  ?Essential thrombocytosis  ?--per oncology, suspect reactive thrombocytosis given GI bleed ?--increasing the dose of Hydrea to 1000 mg once a day x2 days ? ?Hypertension:  ?--hold amlodipine and HCTZ for now ?  ?CKD stage IIIb ?--Cr around baseline ?--monitor Cr  ? ?anxiety, depression ?--resume home Buspar and trazodone  ? ? ?DVT prophylaxis: SCD/Compression stockings ?Code Status: DNR  ?Family Communication:  ?Level of care: Med-Surg ?Dispo:   ?The patient is from: home ?Anticipated d/c is to: home ?Anticipated d/c date is: 1-2 days ?Patient  currently is not medically ready to d/c due to: ensure Hgb stable and not dropping ? ? ?Subjective and Interval History:  ?Pt had EGD today which found actively bleeding duodenal AVM (brisk bleed) that was treated with APC cautery successfully. ? ?Pt denied abdominal pain.   ? ? ?Objective: ?Vitals:  ? 03/28/21 1444 03/28/21 1454 03/28/21 1523 03/28/21 2021  ?BP: (!) 154/53 (!) 163/77 (!) 146/73 132/65  ?Pulse: 93 85 88 88  ?Resp: (!) '21 15 16 16  '$ ?Temp:   97.6 ?F (36.4 ?C) 98.6 ?F (37 ?C)  ?TempSrc:   Oral   ?SpO2: 91% 93% 96% 95%  ?Weight:      ?Height:      ? ? ?Intake/Output Summary (Last 24 hours) at 03/28/2021 2042 ?Last data filed at 03/28/2021 1754 ?Gross per 24 hour  ?Intake 873.56 ml  ?Output 830 ml  ?Net 43.56 ml  ? ?Filed Weights  ? 03/27/21 1510 03/28/21 1340  ?Weight: 48.9 kg 48.1 kg  ? ? ?Examination:  ? ?Constitutional: NAD, AAOx3 ?HEENT: conjunctivae and lids normal, EOMI ?CV: No cyanosis.   ?RESP: normal respiratory effort, on RA ?Extremities: No effusions, edema in BLE ?SKIN: warm, dry ?Neuro: II - XII grossly intact.   ? ? ?Data Reviewed: I have personally reviewed following labs and imaging studies ? ?CBC: ?Recent Labs  ?Lab 03/26/21 ?1333 03/27/21 ?1518 03/28/21 ?0631  ?WBC 16.0* 14.7* 14.4*  ?NEUTROABS 13.4* 11.9*  --   ?HGB 7.0* 6.7* 8.8*  ?HCT 22.8* 22.5* 26.5*  ?MCV 90.5 93.0 85.5  ?PLT 2,010* 1,856* 1,479*  ? ?Basic Metabolic Panel: ?  Recent Labs  ?Lab 03/27/21 ?1518 03/28/21 ?0631  ?NA 139 140  ?K 4.5 4.3  ?CL 104 106  ?CO2 29 27  ?GLUCOSE 130* 98  ?BUN 40* 44*  ?CREATININE 1.63* 1.30*  ?CALCIUM 9.2 8.8*  ? ?GFR: ?Estimated Creatinine Clearance: 24.5 mL/min (A) (by C-G formula based on SCr of 1.3 mg/dL (H)). ?Liver Function Tests: ?No results for input(s): AST, ALT, ALKPHOS, BILITOT, PROT, ALBUMIN in the last 168 hours. ?No results for input(s): LIPASE, AMYLASE in the last 168 hours. ?No results for input(s): AMMONIA in the last 168 hours. ?Coagulation Profile: ?Recent Labs  ?Lab  03/27/21 ?1512  ?INR 1.1  ? ?Cardiac Enzymes: ?No results for input(s): CKTOTAL, CKMB, CKMBINDEX, TROPONINI in the last 168 hours. ?BNP (last 3 results) ?No results for input(s): PROBNP in the last 8760 hours. ?HbA1C: ?No results for input(s): HGBA1C in the last 72 hours. ?CBG: ?No results for input(s): GLUCAP in the last 168 hours. ?Lipid Profile: ?No results for input(s): CHOL, HDL, LDLCALC, TRIG, CHOLHDL, LDLDIRECT in the last 72 hours. ?Thyroid Function Tests: ?No results for input(s): TSH, T4TOTAL, FREET4, T3FREE, THYROIDAB in the last 72 hours. ?Anemia Panel: ?No results for input(s): VITAMINB12, FOLATE, FERRITIN, TIBC, IRON, RETICCTPCT in the last 72 hours. ?Sepsis Labs: ?No results for input(s): PROCALCITON, LATICACIDVEN in the last 168 hours. ? ?Recent Results (from the past 240 hour(s))  ?Resp Panel by RT-PCR (Flu A&B, Covid) Nasopharyngeal Swab     Status: None  ? Collection Time: 03/27/21  3:12 PM  ? Specimen: Nasopharyngeal Swab; Nasopharyngeal(NP) swabs in vial transport medium  ?Result Value Ref Range Status  ? SARS Coronavirus 2 by RT PCR NEGATIVE NEGATIVE Final  ?  Comment: (NOTE) ?SARS-CoV-2 target nucleic acids are NOT DETECTED. ? ?The SARS-CoV-2 RNA is generally detectable in upper respiratory ?specimens during the acute phase of infection. The lowest ?concentration of SARS-CoV-2 viral copies this assay can detect is ?138 copies/mL. A negative result does not preclude SARS-Cov-2 ?infection and should not be used as the sole basis for treatment or ?other patient management decisions. A negative result may occur with  ?improper specimen collection/handling, submission of specimen other ?than nasopharyngeal swab, presence of viral mutation(s) within the ?areas targeted by this assay, and inadequate number of viral ?copies(<138 copies/mL). A negative result must be combined with ?clinical observations, patient history, and epidemiological ?information. The expected result is Negative. ? ?Fact Sheet  for Patients:  ?EntrepreneurPulse.com.au ? ?Fact Sheet for Healthcare Providers:  ?IncredibleEmployment.be ? ?This test is no t yet approved or cleared by the [place] FDA and  ?has been authorized for detection and/or diagnosis of SARS-CoV-2 by ?FDA under an Emergency Use Authorization (EUA). This EUA will remain  ?in effect (meaning this test can be used) for the duration of the ?COVID-19 declaration under Section 564(b)(1) of the Act, 21 ?U.S.C.section 360bbb-3(b)(1), unless the authorization is terminated  ?or revoked sooner.  ? ? ?  ? Influenza A by PCR NEGATIVE NEGATIVE Final  ? Influenza B by PCR NEGATIVE NEGATIVE Final  ?  Comment: (NOTE) ?The Xpert Xpress SARS-CoV-2/FLU/RSV plus assay is intended as an aid ?in the diagnosis of influenza from Nasopharyngeal swab specimens and ?should not be used as a sole basis for treatment. Nasal washings and ?aspirates are unacceptable for Xpert Xpress SARS-CoV-2/FLU/RSV ?testing. ? ?Fact Sheet for Patients: ?EntrepreneurPulse.com.au ? ?Fact Sheet for Healthcare Providers: ?IncredibleEmployment.be ? ?This test is not yet approved or cleared by the [place] FDA and ?has been authorized for detection and/or diagnosis  of SARS-CoV-2 by ?FDA under an Emergency Use Authorization (EUA). This EUA will remain ?in effect (meaning this test can be used) for the duration of the ?COVID-19 declaration under Section 564(b)(1) of the Act, 21 U.S.C. ?section 360bbb-3(b)(1), unless the authorization is terminated or ?revoked. ? ?Performed at [facility], [place], ?[place] [address] ?  ?  ? ? ?Radiology Studies: ?No results found. ? ? ?Scheduled Meds: ? hydroxyurea  1,000 mg Oral BID  ? [START ON 03/31/2021] pantoprazole  40 mg Intravenous Q12H  ? ?Continuous Infusions: ? sodium chloride Stopped (03/27/21 1554)  ? pantoprazole 8 mg/hr (03/28/21 0202)  ? ? ? LOS: 1 day  ? ? ? ?Enzo Bi,  MD ?[facility] ?If 7PM-7AM, please contact night-coverage ?03/28/2021, 8:42 PM  ? ?

## 2021-03-28 NOTE — Op Note (Signed)
[facility] ?Gastroenterology ?Patient Name: Tina Ingram ?Procedure Date: 03/28/2021 2:06 PM ?MRN: [id number] ?Account #: [id number] ?Date of Birth: [birth year] ?Admit Type: Outpatient ?Age: 87 ?Room: [facility] ENDO ROOM 3 ?Gender: Female ?Note Status: Finalized ?Instrument Name: Upper Endoscope [id number] ?Procedure:             Upper GI endoscopy ?Indications:           Iron deficiency anemia secondary to chronic blood  ?                       loss, Melena ?Providers:             Benay Pike. Alice Reichert MD, MD ?Referring MD:          No Local Md, MD (Referring MD) ?Medicines:             Propofol per Anesthesia ?Complications:         No immediate complications. Estimated blood loss:  ?                       Minimal. ?Procedure:             Pre-Anesthesia Assessment: ?                       - The risks and benefits of the procedure and the  ?                       sedation options and risks were discussed with the  ?                       patient. All questions were answered and informed  ?                       consent was obtained. ?                       - Patient identification and proposed procedure were  ?                       verified prior to the procedure by the nurse. The  ?                       procedure was verified in the procedure room. ?                       - ASA Grade Assessment: III - A patient with severe  ?                       systemic disease. ?                       - After reviewing the risks and benefits, the patient  ?                       was deemed in satisfactory condition to undergo the  ?                       procedure. ?                       After obtaining informed  consent, the endoscope was  ?                       passed under direct vision. Throughout the procedure,  ?                       the patient's blood pressure, pulse, and oxygen  ?                       saturations were monitored continuously. The Endoscope  ?                       was introduced through the  mouth, and advanced to the  ?                       third part of duodenum. The upper GI endoscopy was  ?                       accomplished without difficulty. The patient tolerated  ?                       the procedure well. ?Findings: ?     The esophagus was normal. ?     A 2 cm hiatal hernia was present. ?     The second portion of the duodenum and third portion of the duodenum  ?     were normal. ?     A single 7 mm angioectasia with bleeding was found in the duodenal bulb.  ?     Coagulation for hemostasis using argon plasma at 0.8 liters/minute and  ?     20 watts was successful. Estimated blood loss was minimal. ?     The exam was otherwise without abnormality. ?Impression:            - Normal esophagus. ?                       - 2 cm hiatal hernia. ?                       - Normal second portion of the duodenum and third  ?                       portion of the duodenum. ?                       - A single bleeding angioectasia in the duodenum.  ?                       Treated with argon plasma coagulation (APC). ?                       - The examination was otherwise normal. ?                       - No specimens collected. ?Recommendation:        - Return patient to hospital ward for ongoing care. ?                       - Clear liquid diet. ?                       -  Advance diet as tolerated. ?                       - Continue present medications. ?Procedure Code(s):     --- Professional --- ?                       63785, Esophagogastroduodenoscopy, flexible,  ?                       transoral; with control of bleeding, any method ?Diagnosis Code(s):     --- Professional --- ?                       K92.1, Melena (includes Hematochezia) ?                       D50.0, Iron deficiency anemia secondary to blood loss  ?                       (chronic) ?                       K31.811, Angiodysplasia of stomach and duodenum with  ?                       bleeding ?                       K44.9, Diaphragmatic  hernia without obstruction or  ?                       gangrene ?CPT copyright 2019 American Medical Association. All rights reserved. ?The codes documented in this report are preliminary and upon coder review may  ?be revised to meet current compliance requirements. ?Efrain Sella MD, MD ?03/28/2021 2:34:22 PM ?This report has been signed electronically. ?Number of Addenda: 0 ?Note Initiated On: 03/28/2021 2:06 PM ?Estimated Blood Loss:  Estimated blood loss was minimal. ?     [facility] ?

## 2021-03-28 NOTE — Plan of Care (Signed)

## 2021-03-28 NOTE — Anesthesia Postprocedure Evaluation (Signed)
Anesthesia Post Note ? ?Patient: Tina Ingram ? ?Procedure(s) Performed: ESOPHAGOGASTRODUODENOSCOPY (EGD) WITH PROPOFOL ? ?Patient location during evaluation: PACU ?Anesthesia Type: General ?Level of consciousness: awake and alert ?Pain management: pain level controlled ?Vital Signs Assessment: post-procedure vital signs reviewed and stable ?Respiratory status: spontaneous breathing, nonlabored ventilation and respiratory function stable ?Cardiovascular status: blood pressure returned to baseline and stable ?Postop Assessment: no apparent nausea or vomiting ?Anesthetic complications: no ? ? ?No notable events documented. ? ? ?Last Vitals:  ?Vitals:  ? 03/28/21 1444 03/28/21 1454  ?BP: (!) 154/53 (!) 163/77  ?Pulse: 93 85  ?Resp: (!) 21 15  ?Temp:    ?SpO2: 91% 93%  ?  ?Last Pain:  ?Vitals:  ? 03/28/21 1454  ?TempSrc:   ?PainSc: 0-No pain  ? ? ?  ?  ?  ?  ?  ?  ? ?[place] Ouch ? ? ? ? ?

## 2021-03-29 ENCOUNTER — Encounter: Payer: Self-pay | Admitting: Internal Medicine

## 2021-03-29 ENCOUNTER — Telehealth: Payer: Self-pay | Admitting: Internal Medicine

## 2021-03-29 ENCOUNTER — Telehealth: Payer: Self-pay | Admitting: *Deleted

## 2021-03-29 LAB — CBC
HCT: 26.1 % — ABNORMAL LOW (ref 36.0–46.0)
Hemoglobin: 8.3 g/dL — ABNORMAL LOW (ref 12.0–15.0)
MCH: 27.8 pg (ref 26.0–34.0)
MCHC: 31.8 g/dL (ref 30.0–36.0)
MCV: 87.3 fL (ref 80.0–100.0)
Platelets: 1442 10*3/uL (ref 150–400)
RBC: 2.99 MIL/uL — ABNORMAL LOW (ref 3.87–5.11)
RDW: 22.5 % — ABNORMAL HIGH (ref 11.5–15.5)
WBC: 13.6 10*3/uL — ABNORMAL HIGH (ref 4.0–10.5)
nRBC: 0 % (ref 0.0–0.2)

## 2021-03-29 LAB — BASIC METABOLIC PANEL
Anion gap: 8 (ref 5–15)
BUN: 34 mg/dL — ABNORMAL HIGH (ref 8–23)
CO2: 26 mmol/L (ref 22–32)
Calcium: 9.2 mg/dL (ref 8.9–10.3)
Chloride: 108 mmol/L (ref 98–111)
Creatinine, Ser: 1.21 mg/dL — ABNORMAL HIGH (ref 0.44–1.00)
GFR, Estimated: 44 mL/min — ABNORMAL LOW (ref >60)
Glucose, Bld: 85 mg/dL (ref 70–99)
Potassium: 3.9 mmol/L (ref 3.5–5.1)
Sodium: 142 mmol/L (ref 135–145)

## 2021-03-29 LAB — MAGNESIUM: Magnesium: 2.2 mg/dL (ref 1.7–2.4)

## 2021-03-29 MED ORDER — HYDROXYUREA 500 MG PO CAPS
500.0000 mg | ORAL_CAPSULE | Freq: Every day | ORAL | Status: DC
  Administered 2021-03-30: 500 mg via ORAL
  Filled 2021-03-29: qty 1

## 2021-03-29 MED ORDER — PANTOPRAZOLE SODIUM 40 MG PO TBEC
40.0000 mg | DELAYED_RELEASE_TABLET | Freq: Two times a day (BID) | ORAL | 2 refills | Status: AC
Start: 2021-03-29 — End: 2022-02-19

## 2021-03-29 NOTE — TOC Progression Note (Signed)
Transition of Care (TOC) - Progression Note  ? ? ?Patient Details  ?Name: Tina Ingram ?MRN: [id number] ?Date of Birth: November 30, 1934 ? ?Transition of Care (TOC) CM/SW Contact  ?Conception Oms, RN ?Phone Number: ?03/29/2021, 10:56 AM ? ?Clinical Narrative:    ?Transition of Care (TOC) Screening Note ? ? ?Patient Details  ?Name: Tina Ingram ?Date of Birth: Sep 06, 1934 ? ? ?Transition of Care (TOC) CM/SW Contact:    ?Conception Oms, RN ?Phone Number: ?03/29/2021, 10:56 AM ? ? ? ?Transition of Care Department [facility]) has reviewed patient and no TOC needs have been identified at this time. We will continue to monitor patient advancement through interdisciplinary progression rounds. If new patient transition needs arise, please place a TOC consult. ?  ? ? ? ?  ?  ? ?Expected Discharge Plan and Services ?  ?  ?  ?  ?  ?Expected Discharge Date: 03/29/21               ?  ?  ?  ?  ?  ?  ?  ?  ?  ?  ? ? ?Social Determinants of Health (SDOH) Interventions ?  ? ?Readmission Risk Interventions ?No flowsheet data found. ? ?

## 2021-03-29 NOTE — Care Management Important Message (Signed)
Important Message ? ?Patient Details  ?Name: Tina Ingram ?MRN: [id number] ?Date of Birth: [birth year] ? ? ?Medicare Important Message Given:  Yes ? ? ? ? ?Juliann Pulse A Khaden Gater ?03/29/2021, 3:16 PM ?

## 2021-03-29 NOTE — Telephone Encounter (Signed)
I called and spoke to Tina Ingram at  [facility]. I let her know that she is suppose to be d/c on sat. If things keep going well. Dr. Rogue Bussing is covering for this pt from a hematology standpoint. He needs pt to come in 1 day next week to get labs to make sure she is still doing ok and does not need any blood or anything else. We agreed on3/21 at 2:15. I did tell Tina Ingram that if her hgb is low and she might need blood transfusion we will need to find a spot on another day. She is ok with this and also she says that If the pt comes back to the [facility] they will need a new fl2. I called [facility] and spoke to [place] and she will try to get the social worker to work on that.  ?The pt needs an appt 2 weeks also and I need to work on it because of schedule is full. She understands and we will call back for the 2nd appt. ?

## 2021-03-29 NOTE — Progress Notes (Signed)
?PROGRESS NOTE ? ? ? Tina Ingram  QIO:[id number] DOB: 1934/12/27 DOA: 03/27/2021 ?PCP: Derinda Late, MD  ?157A/157A-AA ? ? ?Assessment & Plan: ?  ?Principal Problem: ?  Severe anemia ?Active Problems: ?  Essential thrombocytosis ([place]) ? ? ?Tina Ingram is a 87 y.o. 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  She presented to the emergency room because of abnormal labs.  She went to her hematologist for routine lab work on 03/26/2021.  She was advised to go to the hospital after blood work reviewed hemoglobin of 7 and platelet count of 2,010. ?She also complained of intermittent dizziness and fatigue.  She has been having black stools for a while but she attributes this to iron pills. ?  ?Acute blood loss anemia 2/2 ?Acute upper GI bleed ?--Hgb 7.0 on presentation, a drop from 13 on 03/08/21. ?--s/p 2 units of packed red blood cells.  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 ?Plan: ?--advance diet to soft ?--Monitor Hgb and transfuse to keep Hgb >7 ?--cont PPI gtt ?  ?Essential thrombocytosis  ?--per oncology, suspect reactive thrombocytosis given GI bleed ?--increasing the dose of Hydrea to 1000 mg once a day x2 days ?--resume home dose Hydrea 500 mg daily tomorrow ? ?Hypertension:  ?--hold amlodipine and HCTZ for now ?  ?CKD stage IIIb ?--Cr around baseline ?--monitor Cr  ? ?anxiety, depression ?--cont home Buspar and trazodone ? ? ?DVT prophylaxis: SCD/Compression stockings ?Code Status: DNR  ?Family Communication:  ?Level of care: Med-Surg ?Dispo:   ?The patient is from: ALF ?Anticipated d/c is to: ALF ?Anticipated d/c  date is: likely tomorrow ?Patient currently is not medically ready to d/c due to: need to monitor for plt for 1 more day, per oncology rec. ? ? ?Subjective and Interval History:  ?Pt reported feeling better today.  No abdominal pain.  Tolerating oral intake.  Wanted to go home. ? ? ?Objective: ?Vitals:  ? 03/28/21 2021 03/29/21 0625 03/29/21 0800 03/29/21 1603  ?BP: 132/65 118/64 114/73 126/63  ?Pulse: 88 86 97 92  ?Resp: '16 16 20 17  '$ ?Temp: 98.6 ?F (37 ?C) 98.1 ?F (36.7 ?C) 97.9 ?F (36.6 ?C) 98.1 ?F (36.7 ?C)  ?TempSrc:   Oral   ?SpO2: 95% 97% 98% 97%  ?Weight:      ?Height:      ? ? ?Intake/Output Summary (Last 24 hours) at 03/29/2021 1739 ?Last data filed at 03/29/2021 1439 ?Gross per 24 hour  ?Intake 640 ml  ?Output 100 ml  ?Net 540 ml  ? ?Filed Weights  ? 03/27/21 1510 03/28/21 1340  ?Weight: 48.9 kg 48.1 kg  ? ? ?Examination:  ? ?Constitutional: NAD, AAOx3 ?HEENT: conjunctivae and lids normal, EOMI ?CV: No cyanosis.   ?RESP: normal respiratory effort, on RA ?SKIN: warm, dry ?Neuro: II - XII grossly intact.   ?Psych: Normal mood and affect.  Appropriate judgement and reason ? ? ?Data Reviewed: I have personally reviewed following labs and imaging studies ? ?CBC: ?Recent Labs  ?Lab 03/26/21 ?1333 03/27/21 ?1518 03/28/21 ?0631 03/28/21 ?2122 03/29/21 ?3244  ?WBC 16.0* 14.7* 14.4*  --  13.6*  ?NEUTROABS 13.4* 11.9*  --   --   --   ?HGB 7.0*  6.7* 8.8* 8.3* 8.3*  ?HCT 22.8* 22.5* 26.5* 26.0* 26.1*  ?MCV 90.5 93.0 85.5  --  87.3  ?PLT 2,010* 1,856* 1,479*  --  1,442*  ? ?Basic Metabolic Panel: ?Recent Labs  ?Lab 03/27/21 ?1518 03/28/21 ?0631 03/29/21 ?4193  ?NA 139 140 142  ?K 4.5 4.3 3.9  ?CL 104 106 108  ?CO2 '29 27 26  '$ ?GLUCOSE 130* 98 85  ?BUN 40* 44* 34*  ?CREATININE 1.63* 1.30* 1.21*  ?CALCIUM 9.2 8.8* 9.2  ?MG  --   --  2.2  ? ?GFR: ?Estimated Creatinine Clearance: 26.3 mL/min (A) (by C-G formula based on SCr of 1.21 mg/dL (H)). ?Liver Function Tests: ?No results for input(s): AST, ALT, ALKPHOS, BILITOT, PROT,  ALBUMIN in the last 168 hours. ?No results for input(s): LIPASE, AMYLASE in the last 168 hours. ?No results for input(s): AMMONIA in the last 168 hours. ?Coagulation Profile: ?Recent Labs  ?Lab 03/27/21 ?1512  ?INR 1.1  ? ?Cardiac Enzymes: ?No results for input(s): CKTOTAL, CKMB, CKMBINDEX, TROPONINI in the last 168 hours. ?BNP (last 3 results) ?No results for input(s): PROBNP in the last 8760 hours. ?HbA1C: ?No results for input(s): HGBA1C in the last 72 hours. ?CBG: ?No results for input(s): GLUCAP in the last 168 hours. ?Lipid Profile: ?No results for input(s): CHOL, HDL, LDLCALC, TRIG, CHOLHDL, LDLDIRECT in the last 72 hours. ?Thyroid Function Tests: ?No results for input(s): TSH, T4TOTAL, FREET4, T3FREE, THYROIDAB in the last 72 hours. ?Anemia Panel: ?No results for input(s): VITAMINB12, FOLATE, FERRITIN, TIBC, IRON, RETICCTPCT in the last 72 hours. ?Sepsis Labs: ?No results for input(s): PROCALCITON, LATICACIDVEN in the last 168 hours. ? ?Recent Results (from the past 240 hour(s))  ?Resp Panel by RT-PCR (Flu A&B, Covid) Nasopharyngeal Swab     Status: None  ? Collection Time: 03/27/21  3:12 PM  ? Specimen: Nasopharyngeal Swab; Nasopharyngeal(NP) swabs in vial transport medium  ?Result Value Ref Range Status  ? SARS Coronavirus 2 by RT PCR NEGATIVE NEGATIVE Final  ?  Comment: (NOTE) ?SARS-CoV-2 target nucleic acids are NOT DETECTED. ? ?The SARS-CoV-2 RNA is generally detectable in upper respiratory ?specimens during the acute phase of infection. The lowest ?concentration of SARS-CoV-2 viral copies this assay can detect is ?138 copies/mL. A negative result does not preclude SARS-Cov-2 ?infection and should not be used as the sole basis for treatment or ?other patient management decisions. A negative result may occur with  ?improper specimen collection/handling, submission of specimen other ?than nasopharyngeal swab, presence of viral mutation(s) within the ?areas targeted by this assay, and inadequate number of  viral ?copies(<138 copies/mL). A negative result must be combined with ?clinical observations, patient history, and epidemiological ?information. The expected result is Negative. ? ?Fact Sheet for Patients:  ?EntrepreneurPulse.com.au ? ?Fact Sheet for Healthcare Providers:  ?IncredibleEmployment.be ? ?This test is no t yet approved or cleared by the [place] FDA and  ?has been authorized for detection and/or diagnosis of SARS-CoV-2 by ?FDA under an Emergency Use Authorization (EUA). This EUA will remain  ?in effect (meaning this test can be used) for the duration of the ?COVID-19 declaration under Section 564(b)(1) of the Act, 21 ?U.S.C.section 360bbb-3(b)(1), unless the authorization is terminated  ?or revoked sooner.  ? ? ?  ? Influenza A by PCR NEGATIVE NEGATIVE Final  ? Influenza B by PCR NEGATIVE NEGATIVE Final  ?  Comment: (NOTE) ?The Xpert Xpress SARS-CoV-2/FLU/RSV plus assay is intended as an aid ?in the diagnosis of influenza from Nasopharyngeal swab specimens and ?should not be  used as a sole basis for treatment. Nasal washings and ?aspirates are unacceptable for Xpert Xpress SARS-CoV-2/FLU/RSV ?testing. ? ?Fact Sheet for Patients: ?EntrepreneurPulse.com.au ? ?Fact Sheet for Healthcare Providers: ?IncredibleEmployment.be ? ?This test is not yet approved or cleared by the [place] FDA and ?has been authorized for detection and/or diagnosis of SARS-CoV-2 by ?FDA under an Emergency Use Authorization (EUA). This EUA will remain ?in effect (meaning this test can be used) for the duration of the ?COVID-19 declaration under Section 564(b)(1) of the Act, 21 U.S.C. ?section 360bbb-3(b)(1), unless the authorization is terminated or ?revoked. ? ?Performed at [facility], [place], ?[place] [address] ?  ?  ? ? ?Radiology Studies: ?No results found. ? ? ?Scheduled Meds: ? busPIRone  7.5 mg Oral BID  ? [START ON  03/31/2021] pantoprazole  40 mg Intravenous Q12H  ? traZODone  50 mg Oral QHS  ? ?Continuous Infusions: ? sodium chloride Stopped (03/27/21 1554)  ? pantoprazole 8 mg/hr (03/28/21 0202)  ? ? ? LOS: 2 days  ? ?

## 2021-03-29 NOTE — Telephone Encounter (Signed)
Discussed with Sherry-patient likely discharge on 3/18. ? ?Recommend lab-CBC BMP hold tube in 1 week ? ?#In 2 weeks-CBC BMP hold tube; MD/NP- ? ?Thanks ?GB ?

## 2021-03-29 NOTE — [place] FL2 (Signed)
?[place] MEDICAID FL2 LEVEL OF CARE SCREENING TOOL  ?  ? ?IDENTIFICATION  ?Patient Name: ?TERRYL NIZIOLEK Birthdate: 12/01/1934 Sex: female Admission Date (Current Location): ?03/27/2021  ?[place] and [place] Number: ? [place] ?  Facility and Address:  ?[facility], [address], [place], [place] [address] ?     Provider Number: ?[id number]  ?Attending Physician Name and Address:  ?Enzo Bi, MD ? Relative Name and Phone Number:  ?Chancy Hurter [phone] ?   ?Current Level of Care: ?Hospital Recommended Level of Care: ?Assisted Living Facility Prior Approval Number: ?  ? ?Date Approved/Denied: ?  PASRR Number: ?  ? ?Discharge Plan: ?Other (Comment) (ALF) ?  ? ?Current Diagnoses: ?Patient Active Problem List  ? Diagnosis Date Noted  ? Severe anemia 03/27/2021  ? Essential thrombocytosis ([place]) 03/27/2021  ? Malnutrition of moderate degree 12/13/2020  ? Infectious colitis 12/11/2020  ? UTI (urinary tract infection) 09/18/2020  ? Fall at home, initial encounter 09/18/2020  ? Generalized weakness 09/18/2020  ? GERD without esophagitis 09/18/2020  ? Skin cancer 09/10/2020  ? Symptomatic anemia   ? Iron deficiency anemia due to chronic blood loss   ? Hypokalemia   ? AKI (acute kidney injury) ([place])   ? Essential hypertension   ? Tobacco dependence   ? GI bleed 07/05/2020  ? ? ?Orientation RESPIRATION BLADDER Height & Weight   ?  ?Self, Time, Situation, Place ? Normal Continent Weight: 48.1 kg ?Height:  '5\' 5"'$  (165.1 cm)  ?BEHAVIORAL SYMPTOMS/MOOD NEUROLOGICAL BOWEL NUTRITION STATUS  ?    Continent Diet (See dc summary)  ?AMBULATORY STATUS COMMUNICATION OF NEEDS Skin   ?Independent Verbally Normal ?  ?  ?  ?    ?     ?     ? ? ?Personal Care Assistance Level of Assistance  ?Bathing, Feeding, Dressing Bathing Assistance: Limited assistance ?Feeding assistance: Independent ?Dressing Assistance: Independent ?   ? ?Functional Limitations Info  ?    ?  ?   ? ? ?SPECIAL CARE FACTORS FREQUENCY  ?     ?  ?  ?  ?  ?  ?  ?   ? ? ?Contractures Contractures Info: Not present  ? ? ?Additional Factors Info  ?Code Status, Allergies Code Status Info: DNR ?Allergies Info: NKDA ?  ?  ?  ?   ? ?Current Medications (03/29/2021):  This is the current hospital active medication list ?Current Facility-Administered Medications  ?Medication Dose Route Frequency Provider Last Rate Last Admin  ? 0.9 %  sodium chloride infusion  10 mL/hr Intravenous Once Jennye Boroughs, MD   Held at 03/27/21 1554  ? acetaminophen (TYLENOL) tablet 650 mg  650 mg Oral Q6H PRN Jennye Boroughs, MD      ? Or  ? acetaminophen (TYLENOL) suppository 650 mg  650 mg Rectal Q6H PRN Jennye Boroughs, MD      ? busPIRone (BUSPAR) tablet 7.5 mg  7.5 mg Oral BID Enzo Bi, MD   7.5 mg at 03/29/21 5621  ? ondansetron (ZOFRAN) tablet 4 mg  4 mg Oral Q6H PRN Jennye Boroughs, MD      ? Or  ? ondansetron (ZOFRAN) injection 4 mg  4 mg Intravenous Q6H PRN Jennye Boroughs, MD      ? Derrill Memo ON 03/31/2021] pantoprazole (PROTONIX) injection 40 mg  40 mg Intravenous Q12H Jennye Boroughs, MD      ? pantoprozole (PROTONIX) 80 mg /NS 100 mL infusion  8 mg/hr Intravenous Continuous Jennye Boroughs, MD  10 mL/hr at 03/28/21 0202 8 mg/hr at 03/28/21 0202  ? senna-docusate (Senokot-S) tablet 1 tablet  1 tablet Oral QHS PRN Jennye Boroughs, MD      ? traZODone (DESYREL) tablet 50 mg  50 mg Oral QHS Enzo Bi, MD   50 mg at 03/28/21 2139  ? ? ? ?Discharge Medications: ?Please see discharge summary for a list of discharge medications. ? ?Relevant Imaging Results: ? ?Relevant Lab Results: ? ? ?Additional Information ?SS# [id number] ? ?Conception Oms, RN ? ? ? ? ?

## 2021-03-29 NOTE — Progress Notes (Signed)
Tina Ingram   DOB:03-04-1934   VE#:[id number]   ? ?Subjective: Patient denies any abdominal pain.  Any nausea vomiting.  Wants to get up and move around.  Feeling frustrated in the bed.  S/p EGD- on 3/16-noted to have upper GI bleed. ? ?Objective:  ?Vitals:  ? 03/29/21 1603 03/29/21 1946  ?BP: 126/63 (!) 142/68  ?Pulse: 92 92  ?Resp: 17 16  ?Temp: 98.1 ?F (36.7 ?C) 98.6 ?F (37 ?C)  ?SpO2: 97% 97%  ?  ? ?Intake/Output Summary (Last 24 hours) at 03/29/2021 2349 ?Last data filed at 03/29/2021 1913 ?Gross per 24 hour  ?Intake 880 ml  ?Output --  ?Net 880 ml  ? ? ?Physical Exam ?Vitals and nursing note reviewed.  ?HENT:  ?   Head: Normocephalic and atraumatic.  ?   Mouth/Throat:  ?   Pharynx: Oropharynx is clear.  ?Eyes:  ?   Extraocular Movements: Extraocular movements intact.  ?   Pupils: Pupils are equal, round, and reactive to light.  ?Cardiovascular:  ?   Rate and Rhythm: Normal rate and regular rhythm.  ?Pulmonary:  ?   Comments: Decreased breath sounds bilaterally.  ?Abdominal:  ?   Palpations: Abdomen is soft.  ?Musculoskeletal:     ?   General: Normal range of motion.  ?   Cervical back: Normal range of motion.  ?Skin: ?   General: Skin is warm.  ?Neurological:  ?   General: No focal deficit present.  ?   Mental Status: She is alert and oriented to person, place, and time.  ?Psychiatric:     ?   Behavior: Behavior normal.     ?   Judgment: Judgment normal.  ? ?  ?Labs:  ?Lab Results  ?Component Value Date  ? WBC 13.6 (H) 03/29/2021  ? HGB 8.3 (L) 03/29/2021  ? HCT 26.1 (L) 03/29/2021  ? MCV 87.3 03/29/2021  ? PLT 1,442 (HH) 03/29/2021  ? NEUTROABS 11.9 (H) 03/27/2021  ? ? ?Lab Results  ?Component Value Date  ? NA 142 03/29/2021  ? K 3.9 03/29/2021  ? CL 108 03/29/2021  ? CO2 26 03/29/2021  ? ? ?Studies:  ?No results found. ? ?Essential thrombocytosis ([place]) ?#87 year old female patient with recently diagnosed JAK-2 positive essential thrombocytosis on Hydrea 500 mg once a day is currently admitted to the hospital  for severe anemia hemoglobin /severe thrombocytosis  ? ?#Severe acute anemia hemoglobin~7 [2 weeks prior hemoglobin 13]; sudden drop because of acute GI bleed-s/p EGD on 3/16.  S/p Protonix drip; s/p PRBC transfusion.  Hemoglobin stable at 8.3. ? ?#Extreme thrombocytosis-2 million [appx 2 weeks ago ~900]-patient currently on Hydrea 500 mg twice daily ; no evidence of secondary von Willebrand disease.  Continue to hold off aspirin or any blood thinners at this time.  Patient is high risk of bleeding rather than clotting.  Continue Hydrea 500 mg twice daily at discharge.  Patient will get labs in approximately 1 week.  We will follow-up with MD in 2 weeks labs.  Discussed with cancer center nurse, Judeen Hammans for Dr. Janese Banks. ? ?#If patient's hemoglobin is stable patient discharged on 3/18.  Discussed with Dr. Billie Ruddy. ? ?Cammie Sickle, MD ?03/29/2021  11:49 PM ? ? ?

## 2021-03-29 NOTE — TOC Progression Note (Signed)
Transition of Care (TOC) - Progression Note  ? ? ?Patient Details  ?Name: EMARI HREHA ?MRN: [id number] ?Date of Birth: Oct 08, 1934 ? ?Transition of Care (TOC) CM/SW Contact  ?Conception Oms, RN ?Phone Number: ?03/29/2021, 4:37 PM ? ?Clinical Narrative:   Met with the patient, She lives at [facility] and plans to return there, FL2 updated,  ? ? ? ?  ?  ? ?Expected Discharge Plan and Services ?  ?  ?  ?  ?  ?Expected Discharge Date: 03/29/21               ?  ?  ?  ?  ?  ?  ?  ?  ?  ?  ? ? ?Social Determinants of Health (SDOH) Interventions ?  ? ?Readmission Risk Interventions ?Readmission Risk Prevention Plan 03/29/2021  ?Transportation Screening Complete  ?PCP or Specialist Appt within 3-5 Days Complete  ?[place] or Home Care Consult Complete  ?Palliative Care Screening Not Applicable  ?Medication Review Press photographer) Referral to Pharmacy  ?Some recent data might be hidden  ? ? ?

## 2021-03-30 LAB — BASIC METABOLIC PANEL
Anion gap: 7 (ref 5–15)
BUN: 34 mg/dL — ABNORMAL HIGH (ref 8–23)
CO2: 25 mmol/L (ref 22–32)
Calcium: 8.8 mg/dL — ABNORMAL LOW (ref 8.9–10.3)
Chloride: 108 mmol/L (ref 98–111)
Creatinine, Ser: 1.39 mg/dL — ABNORMAL HIGH (ref 0.44–1.00)
GFR, Estimated: 37 mL/min — ABNORMAL LOW (ref >60)
Glucose, Bld: 92 mg/dL (ref 70–99)
Potassium: 3.8 mmol/L (ref 3.5–5.1)
Sodium: 140 mmol/L (ref 135–145)

## 2021-03-30 LAB — MAGNESIUM: Magnesium: 2.3 mg/dL (ref 1.7–2.4)

## 2021-03-30 LAB — CBC
HCT: 26.7 % — ABNORMAL LOW (ref 36.0–46.0)
Hemoglobin: 8.2 g/dL — ABNORMAL LOW (ref 12.0–15.0)
MCH: 27.3 pg (ref 26.0–34.0)
MCHC: 30.7 g/dL (ref 30.0–36.0)
MCV: 89 fL (ref 80.0–100.0)
Platelets: 1132 10*3/uL (ref 150–400)
RBC: 3 MIL/uL — ABNORMAL LOW (ref 3.87–5.11)
RDW: 22.4 % — ABNORMAL HIGH (ref 11.5–15.5)
WBC: 13.8 10*3/uL — ABNORMAL HIGH (ref 4.0–10.5)
nRBC: 0 % (ref 0.0–0.2)

## 2021-03-30 NOTE — Progress Notes (Signed)
Attempted to contact [facility] twice to report. Attempted calls not successful.  ?

## 2021-03-30 NOTE — TOC Transition Note (Signed)
Transition of Care (TOC) - CM/SW Discharge Note ? ? ?Patient Details  ?Name: Tina Ingram ?MRN: [id number] ?Date of Birth: 06/19/1934 ? ?Transition of Care (TOC) CM/SW Contact:  ?Alberteen Sam, LCSW ?Phone Number: ?03/30/2021, 9:05 AM ? ? ?Clinical Narrative:    ? ?Patient to dc back to [facility], CSW called [facility] at [phone], they report patient can return today, they do not provide transportation.  ? ?CSW spoke with patient's sister Vaughan Basta who will be [facility].  ? ?FL2 and dc summary will be in dc packet for facility.  ? ?No other dc needs identified at this time. RN made aware of above.  ? ?Final next level of care: Assisted Living ?Barriers to Discharge: No Barriers Identified ? ? ?Patient Goals and CMS Choice ?Patient states their goals for this hospitalization and ongoing recovery are:: to go home ?CMS Medicare.gov Compare Post Acute Care list provided to:: Patient ?Choice offered to / list presented to : Patient ? ?Discharge Placement ?  ?           ?  ?  ?  ?Patient and family notified of of transfer: 03/30/21 ? ?Discharge Plan and Services ?  ?  ?           ?  ?  ?  ?  ?  ?  ?  ?  ?  ?  ? ?Social Determinants of Health (SDOH) Interventions ?  ? ? ?Readmission Risk Interventions ?Readmission Risk Prevention Plan 03/29/2021  ?Transportation Screening Complete  ?PCP or Specialist Appt within 3-5 Days Complete  ?[place] or Home Care Consult Complete  ?Palliative Care Screening Not Applicable  ?Medication Review Press photographer) Referral to Pharmacy  ?Some recent data might be hidden  ? ? ? ? ? ?

## 2021-03-30 NOTE — Progress Notes (Signed)
Pt discharged with all personal belongings and the discharge instructions. Staff wheeled pt out. Pt transported to [facility] by sister's vehicle.  ?

## 2021-03-30 NOTE — Discharge Summary (Signed)
? ?Physician Discharge Summary ? ? ?Tina Ingram  female DOB: Aug 13, 1934  ?VHQ:[id number] ? ?PCP: Derinda Late, MD ? ?Admit date: 03/27/2021 ?Discharge date: 03/30/2021 ? ?Admitted From: ALF ?Disposition:  ALF ?CODE STATUS: DNR ? ? ?Hospital Course:  ?For full details, please see H&P, progress notes, consult notes and ancillary notes.  ?Briefly,  ?Tina Ingram is a 87 y.o. 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  She presented to the emergency room because of abnormal labs of hemoglobin of 7 and platelet count of 2,010. ?She also complained of intermittent dizziness and fatigue.  She has been having black stools for a while but she attributed this to iron pills. ?  ?Acute blood loss anemia 2/2 ?Acute upper GI bleed ?--Hgb 7.0 on presentation, a drop from 13 on 03/08/21. ?--s/p 2 units of packed red blood cells.  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 ?--Pt received PPI gtt while inpatient and discharged on PPI BID for 3 months. ?--Hgb stable ~8's prior to discharge. ? ?Essential thrombocytosis  ?--plt 2010 which prompted presentation to ED.  Per oncology, suspect reactive thrombocytosis given GI bleed ?--Pt received increased dose of Hydrea to 1000 mg once a day x2 days then resumed home dose Hydrea 500 mg daily.   ?--plt trended down to 1132 prior to discharge. ?--will continue outpatient f/u and lab checks with hematology. ? ?Hypertension:  ?--amlodipine and HCTZ held during hospitalization given GI bleed and soft BP.  Both resumed after discharge. ?  ?CKD stage IIIb ?--Cr around baseline ?  ?anxiety, depression ?--cont  home Buspar and trazodone ? ? ?Discharge Diagnoses:  ?Principal Problem: ?  Severe anemia ?Active Problems: ?  Essential thrombocytosis ([place]) ? ? ?30 Day Unplanned Readmission Risk Score   ? ?Flowsheet Row ED to Hosp-Admission (Current) from 03/27/2021 in [facility] (1A)  ?30 Day Unplanned Readmission Risk Score (%) 24.39 Filed at 03/30/2021 0400  ? ?  ? ? This score is the patient's risk of an unplanned readmission within 30 days of being discharged (0 -100%). The score is based on dignosis, age, lab data, medications, orders, and past utilization.   ?Low:  0-14.9   Medium: 15-21.9   High: 22-29.9   Extreme: 30 and above ? ?  ? ?  ? ? ?Discharge Instructions: ? ?Allergies as of 03/30/2021   ?No Known Allergies ?  ? ?  ?Medication List  ?  ? ?TAKE these medications   ? ?acetaminophen 325 MG tablet ?Commonly known as: TYLENOL ?Take 2 tablets (650 mg total) by mouth every 6 (six) hours as needed for mild pain (or Fever >/= 101). ?What changed: reasons to take this ?  ?amLODipine 2.5 MG tablet ?Commonly known as: NORVASC ?Take 2.5 mg by mouth daily. ?  ?aspirin EC 81 MG tablet ?Take 81 mg by mouth daily. Swallow whole. ?  ?busPIRone 7.5 MG tablet ?Commonly known as: BUSPAR ?Take 7.5 mg by mouth 2 (two) times daily. ?  ?cetirizine 10 MG tablet ?Commonly known as: ZYRTEC ?Take 10 mg by mouth daily. ?  ?diclofenac Sodium 1 % Gel ?Commonly known as: VOLTAREN ?Apply 1 application topically 4 (four) times daily as needed (pain). ?  ?  hydrochlorothiazide 12.5 MG capsule ?Commonly known as: MICROZIDE ?Take 12.5 mg by mouth daily. ?  ?hydroxyurea 500 MG capsule ?Commonly known as: HYDREA ?Take 1 capsule (500 mg total) by mouth daily. May take with food to minimize GI side effects. ?  ?melatonin 3 MG Tabs tablet ?Take 3 mg by mouth at bedtime. ?  ?multivitamin tablet ?Take 1 tablet by mouth daily. ?  ?ondansetron 4 MG disintegrating tablet ?Commonly known as: ZOFRAN-ODT ?Take 1 tablet (4 mg total)  by mouth every 8 (eight) hours as needed for nausea or vomiting. ?  ?pantoprazole 40 MG tablet ?Commonly known as: PROTONIX ?Take 1 tablet (40 mg total) by mouth 2 (two) times daily. ?What changed: when to take this ?  ?senna-docusate 8.6-50 MG tablet ?Commonly known as: Senokot-S ?Take 1 tablet by mouth at bedtime as needed for mild constipation. ?  ?traZODone 50 MG tablet ?Commonly known as: DESYREL ?Take 50 mg by mouth at bedtime. ?  ?Vitamin D3 50 MCG (2000 UT) Tabs ?Take 1 capsule by mouth daily. ?  ? ?  ? ? ? Follow-up Information   ? ? Derinda Late, MD Follow up in 1 week(s).   ?Specialty: Family Medicine ?Contact information: ?[address] ?[facility] ?[place] [address] ?[phone] ? ? ?  ?  ? ?  ?  ? ?  ? ? ?No Known Allergies ? ? ?The results of significant diagnostics from this hospitalization (including imaging, microbiology, ancillary and laboratory) are listed below for reference.  ? ?Consultations: ? ? ?Procedures/Studies: ?US Abdomen Limited RUQ (LIVER/GB) ? ?Result Date: 03/08/2021 ?CLINICAL DATA:  Right upper quadrant pain EXAM: ULTRASOUND ABDOMEN LIMITED RIGHT UPPER QUADRANT COMPARISON:  CT 12/11/2020 FINDINGS: Gallbladder: Gallstones. Normal wall thickness. No sonographic [place] is indicated Common bile duct: Diameter: 4 mm Liver: No focal lesion identified. Within normal limits in parenchymal echogenicity. Portal vein is patent on color Doppler imaging with normal direction of blood flow towards the liver. Other: Right kidney appears slightly echogenic. Cyst at the lower pole of the right kidney. IMPRESSION: 1. Cholelithiasis without sonographic evidence for acute cholecystitis or biliary dilatation 2. Echogenic right kidney suggesting medical renal disease Electronically Signed   By: Donavan Foil M.D.   On: 03/08/2021 17:32   ? ? ? ?Labs: ?BNP (last 3 results) ?No results for input(s): BNP in the last 8760 hours. ?Basic Metabolic Panel: ?Recent  Labs  ?Lab 03/27/21 ?1518 03/28/21 ?0631 03/29/21 ?9326 03/30/21 ?0527  ?NA 139 140 142 140  ?K 4.5 4.3 3.9 3.8  ?CL 104 106 108 108  ?CO2 '29 27 26 25  '$ ?GLUCOSE 130* 98 85 92  ?BUN 40* 44* 34* 34*  ?CREATININE 1.63* 1.30* 1.21* 1.39*  ?CALCIUM 9.2 8.8* 9.2 8.8*  ?MG  --   --  2.2 2.3  ? ?Liver Function Tests: ?No results for input(s): AST, ALT, ALKPHOS, BILITOT, PROT, ALBUMIN in the last 168 hours. ?No results for input(s): LIPASE, AMYLASE in the last 168 hours. ?No results for input(s): AMMONIA in the last 168 hours. ?CBC: ?Recent Labs  ?Lab 03/26/21 ?1333 03/27/21 ?1518 03/28/21 ?0631 03/28/21 ?2122 03/29/21 ?7124 03/30/21 ?0527  ?WBC 16.0* 14.7* 14.4*  --  13.6* 13.8*  ?NEUTROABS 13.4* 11.9*  --   --   --   --   ?HGB 7.0* 6.7* 8.8* 8.3* 8.3* 8.2*  ?HCT 22.8* 22.5* 26.5* 26.0* 26.1* 26.7*  ?MCV 90.5 93.0 85.5  --  87.3 89.0  ?PLT 2,010* 1,856* 1,479*  --  1,442* 1,132*  ? ?Cardiac Enzymes: ?No results for input(s): CKTOTAL, CKMB, CKMBINDEX, TROPONINI in the last 168 hours. ?BNP: ?Invalid input(s): POCBNP ?CBG: ?No results for input(s): GLUCAP in the last 168 hours. ?D-Dimer ?No results for input(s): DDIMER in the last 72 hours. ?Hgb A1c ?No results for input(s): HGBA1C in the last 72 hours. ?Lipid Profile ?No results for input(s): CHOL, HDL, LDLCALC, TRIG, CHOLHDL, LDLDIRECT in the last 72 hours. ?Thyroid function studies ?No results for input(s): TSH, T4TOTAL, T3FREE, THYROIDAB in the last 72 hours. ? ?Invalid input(s): FREET3 ?Anemia work up ?No results for input(s): VITAMINB12, FOLATE, FERRITIN, TIBC, IRON, RETICCTPCT in the last 72 hours. ?Urinalysis ?   ?Component Value Date/Time  ? COLORURINE YELLOW (A) 03/08/2021 1730  ? APPEARANCEUR HAZY (A) 03/08/2021 1730  ? LABSPEC 1.009 03/08/2021 1730  ? PHURINE 8.0 03/08/2021 1730  ? [place] NEGATIVE 03/08/2021 1730  ? [place] NEGATIVE 03/08/2021 1730  ? [place] NEGATIVE 03/08/2021 1730  ? KETONESUR 5 (A) 03/08/2021 1730  ? [place] NEGATIVE 03/08/2021 1730  ?  NITRITE NEGATIVE 03/08/2021 1730  ? LEUKOCYTESUR LARGE (A) 03/08/2021 1730  ? ?Sepsis Labs ?Invalid input(s): PROCALCITONIN,  WBC,  LACTICIDVEN ?Microbiology ?Recent Results (from the past 240 hour(s))

## 2021-04-01 ENCOUNTER — Encounter: Payer: Self-pay | Admitting: Oncology

## 2021-04-02 ENCOUNTER — Other Ambulatory Visit: Payer: PPO

## 2021-04-03 ENCOUNTER — Other Ambulatory Visit: Payer: Self-pay | Admitting: *Deleted

## 2021-04-03 DIAGNOSIS — D473 Essential (hemorrhagic) thrombocythemia: Secondary | ICD-10-CM

## 2021-04-03 DIAGNOSIS — D649 Anemia, unspecified: Secondary | ICD-10-CM

## 2021-04-03 DIAGNOSIS — I10 Essential (primary) hypertension: Secondary | ICD-10-CM | POA: Diagnosis not present

## 2021-04-03 DIAGNOSIS — D509 Iron deficiency anemia, unspecified: Secondary | ICD-10-CM | POA: Diagnosis not present

## 2021-04-04 ENCOUNTER — Other Ambulatory Visit: Payer: Self-pay | Admitting: *Deleted

## 2021-04-04 ENCOUNTER — Inpatient Hospital Stay: Payer: PPO

## 2021-04-04 ENCOUNTER — Other Ambulatory Visit: Payer: Self-pay

## 2021-04-04 DIAGNOSIS — D649 Anemia, unspecified: Secondary | ICD-10-CM

## 2021-04-04 DIAGNOSIS — D473 Essential (hemorrhagic) thrombocythemia: Secondary | ICD-10-CM

## 2021-04-04 LAB — CBC WITH DIFFERENTIAL/PLATELET
Abs Immature Granulocytes: 0.07 10*3/uL (ref 0.00–0.07)
Basophils Absolute: 0 10*3/uL (ref 0.0–0.1)
Basophils Relative: 0 %
Eosinophils Absolute: 0.2 10*3/uL (ref 0.0–0.5)
Eosinophils Relative: 4 %
HCT: 27.9 % — ABNORMAL LOW (ref 36.0–46.0)
Hemoglobin: 8.5 g/dL — ABNORMAL LOW (ref 12.0–15.0)
Immature Granulocytes: 1 %
Lymphocytes Relative: 18 %
Lymphs Abs: 1 10*3/uL (ref 0.7–4.0)
MCH: 28.1 pg (ref 26.0–34.0)
MCHC: 30.5 g/dL (ref 30.0–36.0)
MCV: 92.4 fL (ref 80.0–100.0)
Monocytes Absolute: 0.2 10*3/uL (ref 0.1–1.0)
Monocytes Relative: 4 %
Neutro Abs: 4 10*3/uL (ref 1.7–7.7)
Neutrophils Relative %: 73 %
Platelets: 692 10*3/uL — ABNORMAL HIGH (ref 150–400)
RBC: 3.02 MIL/uL — ABNORMAL LOW (ref 3.87–5.11)
RDW: 22.6 % — ABNORMAL HIGH (ref 11.5–15.5)
WBC: 5.5 10*3/uL (ref 4.0–10.5)
nRBC: 0 % (ref 0.0–0.2)

## 2021-04-04 LAB — BASIC METABOLIC PANEL
Anion gap: 4 — ABNORMAL LOW (ref 5–15)
BUN: 33 mg/dL — ABNORMAL HIGH (ref 8–23)
CO2: 29 mmol/L (ref 22–32)
Calcium: 9.1 mg/dL (ref 8.9–10.3)
Chloride: 105 mmol/L (ref 98–111)
Creatinine, Ser: 1.5 mg/dL — ABNORMAL HIGH (ref 0.44–1.00)
GFR, Estimated: 34 mL/min — ABNORMAL LOW (ref >60)
Glucose, Bld: 119 mg/dL — ABNORMAL HIGH (ref 70–99)
Potassium: 4.6 mmol/L (ref 3.5–5.1)
Sodium: 138 mmol/L (ref 135–145)

## 2021-04-04 LAB — SAMPLE TO BLOOD BANK

## 2021-04-04 NOTE — Progress Notes (Signed)
Orders for labs

## 2021-04-08 ENCOUNTER — Other Ambulatory Visit: Payer: Self-pay | Admitting: *Deleted

## 2021-04-08 ENCOUNTER — Encounter: Payer: Self-pay | Admitting: Oncology

## 2021-04-09 DIAGNOSIS — I10 Essential (primary) hypertension: Secondary | ICD-10-CM | POA: Diagnosis not present

## 2021-04-09 DIAGNOSIS — M6281 Muscle weakness (generalized): Secondary | ICD-10-CM | POA: Diagnosis not present

## 2021-04-10 ENCOUNTER — Inpatient Hospital Stay: Payer: PPO

## 2021-04-10 ENCOUNTER — Inpatient Hospital Stay: Payer: PPO | Admitting: Oncology

## 2021-04-17 DIAGNOSIS — D509 Iron deficiency anemia, unspecified: Secondary | ICD-10-CM | POA: Diagnosis not present

## 2021-04-17 DIAGNOSIS — I10 Essential (primary) hypertension: Secondary | ICD-10-CM | POA: Diagnosis not present

## 2021-04-19 ENCOUNTER — Encounter: Payer: Self-pay | Admitting: Oncology

## 2021-04-19 ENCOUNTER — Other Ambulatory Visit: Payer: Self-pay | Admitting: *Deleted

## 2021-04-19 ENCOUNTER — Inpatient Hospital Stay: Payer: PPO | Attending: Nurse Practitioner

## 2021-04-19 ENCOUNTER — Telehealth: Payer: Self-pay | Admitting: *Deleted

## 2021-04-19 ENCOUNTER — Inpatient Hospital Stay (HOSPITAL_BASED_OUTPATIENT_CLINIC_OR_DEPARTMENT_OTHER): Payer: PPO | Admitting: Oncology

## 2021-04-19 VITALS — BP 111/55 | HR 80 | Temp 97.5°F | Resp 14 | Wt 110.5 lb

## 2021-04-19 DIAGNOSIS — D509 Iron deficiency anemia, unspecified: Secondary | ICD-10-CM | POA: Diagnosis not present

## 2021-04-19 DIAGNOSIS — D473 Essential (hemorrhagic) thrombocythemia: Secondary | ICD-10-CM | POA: Insufficient documentation

## 2021-04-19 DIAGNOSIS — Z79899 Other long term (current) drug therapy: Secondary | ICD-10-CM | POA: Diagnosis not present

## 2021-04-19 DIAGNOSIS — D500 Iron deficiency anemia secondary to blood loss (chronic): Secondary | ICD-10-CM

## 2021-04-19 LAB — CBC WITH DIFFERENTIAL/PLATELET
Abs Immature Granulocytes: 0.05 10*3/uL (ref 0.00–0.07)
Basophils Absolute: 0 10*3/uL (ref 0.0–0.1)
Basophils Relative: 0 %
Eosinophils Absolute: 0.2 10*3/uL (ref 0.0–0.5)
Eosinophils Relative: 4 %
HCT: 31.2 % — ABNORMAL LOW (ref 36.0–46.0)
Hemoglobin: 9.4 g/dL — ABNORMAL LOW (ref 12.0–15.0)
Immature Granulocytes: 1 %
Lymphocytes Relative: 17 %
Lymphs Abs: 0.9 10*3/uL (ref 0.7–4.0)
MCH: 29 pg (ref 26.0–34.0)
MCHC: 30.1 g/dL (ref 30.0–36.0)
MCV: 96.3 fL (ref 80.0–100.0)
Monocytes Absolute: 0.3 10*3/uL (ref 0.1–1.0)
Monocytes Relative: 6 %
Neutro Abs: 4 10*3/uL (ref 1.7–7.7)
Neutrophils Relative %: 72 %
Platelets: 921 10*3/uL (ref 150–400)
RBC: 3.24 MIL/uL — ABNORMAL LOW (ref 3.87–5.11)
RDW: 20.8 % — ABNORMAL HIGH (ref 11.5–15.5)
WBC: 5.4 10*3/uL (ref 4.0–10.5)
nRBC: 0 % (ref 0.0–0.2)

## 2021-04-19 LAB — IRON AND TIBC
Iron: 106 ug/dL (ref 28–170)
Saturation Ratios: 25 % (ref 10.4–31.8)
TIBC: 424 ug/dL (ref 250–450)
UIBC: 318 ug/dL

## 2021-04-19 LAB — FERRITIN: Ferritin: 10 ng/mL — ABNORMAL LOW (ref 11–307)

## 2021-04-19 MED ORDER — HYDROXYUREA 500 MG PO CAPS: 500.0000 mg | ORAL_CAPSULE | ORAL | 2 refills | Status: DC

## 2021-04-19 NOTE — Telephone Encounter (Signed)
Called and spoke to staff at [facility] and told them that we are adding more pills for hydrea. She will take 1 tablet daily  and add 1 tablet extra on mon and thursdays. I put the new rx in to the pharmacy that assisted living uses and sent a note that we changed the dose and wrote instructions ?

## 2021-04-19 NOTE — Progress Notes (Signed)
? ? ? ?Hematology/Oncology Consult note ?[facility]  ?[phone] [phone] ? ?Patient Care Team: ?Derinda Late, MD as PCP - General (Family Medicine)  ? ?Name of the patient: Tina Ingram  ?[id number]  ?[birth year]  ? ?Date of visit: 04/19/21 ? ?Diagnosis-JAK2 positive essential thrombocythemia ?Iron deficiency anemia ? ?Chief complaint/ Reason for visit-routine follow-up of thrombocytosis and anemia ? ?Heme/Onc history: Patient is a 87 year old female with a past medical history significant for hypertension, osteoporosis, history of falls and C. difficile infection referred for thrombocytosis.  She was in the hospital in December 2022 for infectious colitis and was found to have platelet counts between [phone].  Looking back at her prior CBCs patient had a normal platelet count up until June 2022 although she has had a transient platelet count of 1240 in December 2020.  Over the last 6 months her platelet counts have been waxing and waning between [phone].  Patient is a resident of [facility].  Thrombocytosis persisted despite correction of iron deficiency.  JAK2 mutation positive.  BCR ABL FISH testing Did not show any BCR abl gene rearrangement.  ESR normal. ?  ? ?Interval history-patient feels at her baseline and denies any new complaints at this time.  She is to reports taking oral iron and states that her stools have still been black.  She lives at [facility] ? ?ECOG PS- 3 ?Pain scale- 0 ? ? ?Review of systems- Review of Systems  ?Constitutional:  Positive for malaise/fatigue. Negative for chills, fever and weight loss.  ?HENT:  Negative for congestion, ear discharge and nosebleeds.   ?Eyes:  Negative for blurred vision.  ?Respiratory:  Negative for cough, hemoptysis, sputum production, shortness of breath and wheezing.   ?Cardiovascular:  Negative for chest pain, palpitations, orthopnea and claudication.  ?Gastrointestinal:   Negative for abdominal pain, blood in stool, constipation, diarrhea, heartburn, melena, nausea and vomiting.  ?Genitourinary:  Negative for dysuria, flank pain, frequency, hematuria and urgency.  ?Musculoskeletal:  Negative for back pain, joint pain and myalgias.  ?Skin:  Negative for rash.  ?Neurological:  Negative for dizziness, tingling, focal weakness, seizures, weakness and headaches.  ?Endo/Heme/Allergies:  Does not bruise/bleed easily.  ?Psychiatric/Behavioral:  Negative for depression and suicidal ideas. The patient does not have insomnia.    ? ?No Known Allergies ? ? ?Past Medical History:  ?Diagnosis Date  ? Basal cell carcinoma 06/21/2020  ? left posterior shoulder  ? Breast mass, left   ? Essential thrombocytosis ([facility]) 02/2021  ? Family history of adverse reaction to anesthesia   ? sister had some trouble waking up  ? Hypertension   ? Smoker   ? 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 left vertex scalp - MOHS 08/21/2020 ([facility]), nasal tip  ? ? ? ?Past Surgical History:  ?Procedure Laterality Date  ? APPENDECTOMY    ? BREAST BIOPSY Left 05/16/2014  ? complex sclerosing lesion   ? BREAST EXCISIONAL BIOPSY Left 2016  ? surgical exc to remove complex sclerosing lesion  ? BREAST LUMPECTOMY WITH RADIOACTIVE SEED LOCALIZATION Left 06/20/2014  ? Procedure: BREAST LUMPECTOMY WITH RADIOACTIVE SEED LOCALIZATION;  Surgeon: Erroll Luna, MD;  Location: [facility];  Service: General;  Laterality: Left;  ? CAROTID ENDARTERECTOMY Right   ? CATARACT EXTRACTION W/PHACO Right 06/25/2015  ? Procedure: CATARACT EXTRACTION PHACO AND INTRAOCULAR LENS PLACEMENT (IOC);  Surgeon: Estill Cotta, MD;  Location: [facility];  Service: Ophthalmology;  Laterality: Right;  [place] 2.01 ?  AP% 24.5 ?CDE 52.16 ?Fluid pack lot # [id number]  ? COLONOSCOPY    ? COLONOSCOPY WITH PROPOFOL N/A 07/07/2020  ? Procedure: COLONOSCOPY WITH PROPOFOL;  Surgeon: Lesly Rubenstein, MD;  Location: [facility] ENDOSCOPY;  Service:  Endoscopy;  Laterality: N/A;  ? ESOPHAGOGASTRODUODENOSCOPY N/A 07/10/2020  ? Procedure: ESOPHAGOGASTRODUODENOSCOPY (EGD);  Surgeon: Jonathon Bellows, MD;  Location: [facility] ENDOSCOPY;  Service: Gastroenterology;  Laterality: N/A;  ? ESOPHAGOGASTRODUODENOSCOPY (EGD) WITH PROPOFOL N/A 07/06/2020  ? Procedure: ESOPHAGOGASTRODUODENOSCOPY (EGD) WITH PROPOFOL;  Surgeon: [name], MD;  Location: [facility] ENDOSCOPY;  Service: Endoscopy;  Laterality: N/A;  ? ESOPHAGOGASTRODUODENOSCOPY (EGD) WITH PROPOFOL N/A 03/28/2021  ? Procedure: ESOPHAGOGASTRODUODENOSCOPY (EGD) WITH PROPOFOL;  Surgeon: Toledo, Benay Pike, MD;  Location: [facility] ENDOSCOPY;  Service: Gastroenterology;  Laterality: N/A;  ? EYE SURGERY    ? KYPHOPLASTY N/A 01/27/2017  ? Procedure: [id number];  Surgeon: Hessie Knows, MD;  Location: [facility];  Service: Orthopedics;  Laterality: N/A;  T-9 ?  ? ? ?Social History  ? ?Socioeconomic History  ? Marital status: Divorced  ?  Spouse name: Not on file  ? Number of children: Not on file  ? Years of education: Not on file  ? Highest education level: Not on file  ?Occupational History  ? Not on file  ?Tobacco Use  ? Smoking status: Every Day  ?  Packs/day: 1.00  ?  Types: Cigarettes  ? Smokeless tobacco: Never  ?Vaping Use  ? Vaping Use: Never used  ?Substance and Sexual Activity  ? Alcohol use: No  ? Drug use: Never  ? Sexual activity: Never  ?Other Topics Concern  ? Not on file  ?Social History Narrative  ? Not on file  ? ?Social Determinants of Health  ? ?Financial Resource Strain: Not on file  ?Food Insecurity: Not on file  ?Transportation Needs: Not on file  ?Physical Activity: Not on file  ?Stress: Not on file  ?Social Connections: Not on file  ?Intimate Partner Violence: Not on file  ? ? ?Family History  ?Problem Relation Age of Onset  ? Breast cancer Neg Hx   ? ? ? ?Current Outpatient Medications:  ?  acetaminophen (TYLENOL) 325 MG tablet, Take 2 tablets (650 mg total) by mouth every 6 (six) hours as needed for mild pain (or  Fever >/= 101)., Disp: , Rfl:  ?  amLODipine (NORVASC) 2.5 MG tablet, Take 2.5 mg by mouth daily., Disp: , Rfl:  ?  aspirin EC 81 MG tablet, Take 81 mg by mouth daily. Swallow whole., Disp: , Rfl:  ?  busPIRone (BUSPAR) 7.5 MG tablet, Take 7.5 mg by mouth 2 (two) times daily., Disp: , Rfl:  ?  cetirizine (ZYRTEC) 10 MG tablet, Take 10 mg by mouth daily., Disp: , Rfl:  ?  Cholecalciferol (VITAMIN D3) 50 MCG (2000 UT) TABS, Take 1 capsule by mouth daily., Disp: , Rfl:  ?  hydrochlorothiazide (MICROZIDE) 12.5 MG capsule, Take 12.5 mg by mouth daily., Disp: , Rfl:  ?  melatonin 3 MG TABS tablet, Take 3 mg by mouth at bedtime., Disp: , Rfl:  ?  Multiple Vitamin (MULTIVITAMIN) tablet, Take 1 tablet by mouth daily., Disp: , Rfl:  ?  ondansetron (ZOFRAN-ODT) 4 MG disintegrating tablet, Take 1 tablet (4 mg total) by mouth every 8 (eight) hours as needed for nausea or vomiting., Disp: 20 tablet, Rfl: 0 ?  pantoprazole (PROTONIX) 40 MG tablet, Take 1 tablet (40 mg total) by mouth 2 (two) times daily., Disp: 60 tablet, Rfl: 2 ?  senna-docusate (  SENOKOT-S) 8.6-50 MG tablet, Take 1 tablet by mouth at bedtime as needed for mild constipation., Disp: , Rfl:  ?  traZODone (DESYREL) 50 MG tablet, Take 50 mg by mouth at bedtime., Disp: , Rfl:  ?  diclofenac Sodium (VOLTAREN) 1 % GEL, Apply 1 application topically 4 (four) times daily as needed (pain). (Patient not taking: Reported on 04/19/2021), Disp: , Rfl:  ? ?Physical exam:  ?Vitals:  ? 04/19/21 1118  ?BP: (!) 111/55  ?Pulse: 80  ?Resp: 14  ?Temp: (!) 97.5 ?F (36.4 ?C)  ?SpO2: 100%  ?Weight: 110 lb 8 oz (50.1 kg)  ? ?Physical Exam ?Constitutional:   ?   General: She is not in acute distress. ?Cardiovascular:  ?   Rate and Rhythm: Normal rate and regular rhythm.  ?   Heart sounds: Normal heart sounds.  ?Pulmonary:  ?   Effort: Pulmonary effort is normal.  ?   Breath sounds: Normal breath sounds.  ?Abdominal:  ?   General: Bowel sounds are normal.  ?   Palpations: Abdomen is soft.   ?Skin: ?   General: Skin is warm and dry.  ?Neurological:  ?   Mental Status: She is alert and oriented to person, place, and time.  ?  ? ? ?  Latest Ref Rng & Units 04/04/2021  ?  3:30 PM  ?CMP  ?Glucose 70 - 99 mg

## 2021-04-19 NOTE — Progress Notes (Signed)
Pt will like her nose looked at because she is afraid of it spreading.  ?

## 2021-04-26 ENCOUNTER — Inpatient Hospital Stay: Payer: PPO

## 2021-04-26 VITALS — BP 129/55 | HR 78 | Temp 97.7°F | Resp 16

## 2021-04-26 DIAGNOSIS — D500 Iron deficiency anemia secondary to blood loss (chronic): Secondary | ICD-10-CM

## 2021-04-26 DIAGNOSIS — D473 Essential (hemorrhagic) thrombocythemia: Secondary | ICD-10-CM | POA: Diagnosis not present

## 2021-04-26 LAB — CBC WITH DIFFERENTIAL/PLATELET
Abs Immature Granulocytes: 0.09 10*3/uL — ABNORMAL HIGH (ref 0.00–0.07)
Basophils Absolute: 0 10*3/uL (ref 0.0–0.1)
Basophils Relative: 0 %
Eosinophils Absolute: 0.2 10*3/uL (ref 0.0–0.5)
Eosinophils Relative: 2 %
HCT: 32.7 % — ABNORMAL LOW (ref 36.0–46.0)
Hemoglobin: 10 g/dL — ABNORMAL LOW (ref 12.0–15.0)
Immature Granulocytes: 1 %
Lymphocytes Relative: 15 %
Lymphs Abs: 1.2 10*3/uL (ref 0.7–4.0)
MCH: 29.2 pg (ref 26.0–34.0)
MCHC: 30.6 g/dL (ref 30.0–36.0)
MCV: 95.3 fL (ref 80.0–100.0)
Monocytes Absolute: 0.4 10*3/uL (ref 0.1–1.0)
Monocytes Relative: 5 %
Neutro Abs: 6.5 10*3/uL (ref 1.7–7.7)
Neutrophils Relative %: 77 %
Platelets: 859 10*3/uL — ABNORMAL HIGH (ref 150–400)
RBC: 3.43 MIL/uL — ABNORMAL LOW (ref 3.87–5.11)
RDW: 18.9 % — ABNORMAL HIGH (ref 11.5–15.5)
WBC: 8.4 10*3/uL (ref 4.0–10.5)
nRBC: 0 % (ref 0.0–0.2)

## 2021-04-26 LAB — IRON AND TIBC
Iron: 52 ug/dL (ref 28–170)
Saturation Ratios: 12 % (ref 10.4–31.8)
TIBC: 452 ug/dL — ABNORMAL HIGH (ref 250–450)
UIBC: 400 ug/dL

## 2021-04-26 LAB — FERRITIN: Ferritin: 8 ng/mL — ABNORMAL LOW (ref 11–307)

## 2021-04-26 MED ORDER — SODIUM CHLORIDE 0.9 % IV SOLN
510.0000 mg | INTRAVENOUS | Status: DC
  Administered 2021-04-26: 510 mg via INTRAVENOUS
  Filled 2021-04-26: qty 510

## 2021-04-26 MED ORDER — SODIUM CHLORIDE 0.9 % IV SOLN
Freq: Once | INTRAVENOUS | Status: AC
  Filled 2021-04-26: qty 250

## 2021-04-26 NOTE — Patient Instructions (Addendum)
Ferumoxytol Injection ?What is this medication? ?FERUMOXYTOL (FER ue MOX i tol) treats low levels of iron in your body (iron deficiency anemia). Iron is a mineral that plays an important role in making red blood cells, which carry oxygen from your lungs to the rest of your body. ?This medicine may be used for other purposes; ask your health care provider or pharmacist if you have questions. ?COMMON BRAND NAME(S): Feraheme ?What should I tell my care team before I take this medication? ?They need to know if you have any of these conditions: ?Anemia not caused by low iron levels ?High levels of iron in the blood ?Magnetic resonance imaging (MRI) test scheduled ?An unusual or allergic reaction to iron, other medications, foods, dyes, or preservatives ?Pregnant or trying to get pregnant ?Breast-feeding ?How should I use this medication? ?This medication is for injection into a vein. It is given in a hospital or clinic setting. ?Talk to your care team the use of this medication in children. Special care may be needed. ?Overdosage: If you think you have taken too much of this medicine contact a poison control center or emergency room at once. ?NOTE: This medicine is only for you. Do not share this medicine with others. ?What if I miss a dose? ?It is important not to miss your dose. Call your care team if you are unable to keep an appointment. ?What may interact with this medication? ?Other iron products ?This list may not describe all possible interactions. Give your health care provider a list of all the medicines, herbs, non-prescription drugs, or dietary supplements you use. Also tell them if you smoke, drink alcohol, or use illegal drugs. Some items may interact with your medicine. ?What should I watch for while using this medication? ?Visit your care team regularly. Tell your care team if your symptoms do not start to get better or if they get worse. You may need blood work done while you are taking this  medication. ?You may need to follow a special diet. Talk to your care team. Foods that contain iron include: whole grains/cereals, dried fruits, beans, or peas, leafy green vegetables, and organ meats (liver, kidney). ?What side effects may I notice from receiving this medication? ?Side effects that you should report to your care team as soon as possible: ?Allergic reactions--skin rash, itching, hives, swelling of the face, lips, tongue, or throat ?Low blood pressure--dizziness, feeling faint or lightheaded, blurry vision ?Shortness of breath ?Side effects that usually do not require medical attention (report to your care team if they continue or are bothersome): ?Flushing ?Headache ?Joint pain ?Muscle pain ?Nausea ?Pain, redness, or irritation at injection site ?This list may not describe all possible side effects. Call your doctor for medical advice about side effects. You may report side effects to FDA at 1-800-FDA-1088. ?Where should I keep my medication? ?This medication is given in a hospital or clinic and will not be stored at home. ?NOTE: This sheet is a summary. It may not cover all possible information. If you have questions about this medicine, talk to your doctor, pharmacist, or health care provider. ?? 2023 Elsevier/Gold Standard (2020-05-25 00:00:00) ? ? ? ?Iron Deficiency Anemia, Adult ?Iron deficiency anemia is when you do not have enough red blood cells or hemoglobin in your blood. This happens because you have too little iron in your body. Hemoglobin carries oxygen to parts of the body. Anemia can cause your body to not get enough oxygen. ?What are the causes? ?Not eating enough foods that  have iron in them. ?The body not being able to take in iron well. ?Needing more iron due to pregnancy or heavy menstrual periods, for females. ?Cancer. ?Bleeding in the bowels. ?Many blood draws. ?What increases the risk? ?Being pregnant. ?Being a teenage girl going through a growth spurt. ?What are the signs or  symptoms? ?Pale skin, lips, and nails. ?Weakness, dizziness, and getting tired easily. ?Headache. ?Feeling like you cannot breathe well when moving (shortness of breath). ?Cold hands and feet. ?Fast heartbeat or a heartbeat that is not regular. ?Feeling grouchy (irritable) or breathing fast. These are more common in very bad anemia. ?Mild anemia may not cause any symptoms. ?How is this treated? ?This condition is treated by finding out why you do not have enough iron and then getting more iron. It may include: ?Adding foods to your diet that have a lot of iron. ?Taking iron pills (supplements). If you are pregnant or breastfeeding, you may need to take extra iron. Your diet often does not provide the amount of iron that you need. ?Getting more vitamin C in your diet. Vitamin C helps your body take in iron. You may need to take iron pills with a glass of orange juice or vitamin C pills. ?Medicines to make heavy menstrual periods lighter. ?Surgery. ?You may need blood tests to see if treatment is working. If the treatment does not seem to be working, you may need more tests. ?Follow these instructions at home: ?Medicines ?Take over-the-counter and prescription medicines only as told by your doctor. This includes iron pills and vitamins. ?Take iron pills when your stomach is empty. If you cannot handle this, take them with food. ?Do not drink milk or take antacids at the same time as your iron pills. ?Iron pills may turn your poop (stool)black. ?If you cannot handle taking iron pills by mouth, ask your doctor about getting iron through: ?An IV tube. ?A shot (injection) into a muscle. ?Eating and drinking ? ?Talk with your doctor before changing the foods you eat. He or she may tell you to eat foods that have a lot of iron, such as: ?Liver. ?Low-fat (lean) beef. ?Breads and cereals that have iron added to them. ?Eggs. ?Dried fruit. ?Dark green, leafy vegetables. ?Eat fresh fruits and vegetables that are high in vitamin  C. They help your body use iron. Foods with a lot of vitamin C include: ?Oranges. ?Peppers. ?Tomatoes. ?Mangoes. ?Drink enough fluid to keep your pee (urine) pale yellow. ?Managing constipation ?If you are taking iron pills, they may cause trouble pooping (constipation). To prevent or treat trouble pooping, you may need to: ?Take over-the-counter or prescription medicines. ?Eat foods that are high in fiber. These include beans, whole grains, and fresh fruits and vegetables. ?Limit foods that are high in fat and sugar. These include fried or sweet foods. ?General instructions ?Return to your normal activities as told by your doctor. Ask your doctor what activities are safe for you. ?Keep yourself clean, and keep things clean around you. ?Keep all follow-up visits as told by your doctor. This is important. ?Contact a doctor if: ?You feel like you may vomit (nauseous), or you vomit. ?You feel weak. ?You are sweating for no reason. ?You have trouble pooping, such as: ?Pooping less than 3 times a week. ?Straining to poop. ?Having poop that is hard, dry, or larger than normal. ?Feeling full or bloated. ?Pain in the lower belly. ?Not feeling better after pooping. ?Get help right away if: ?You pass out (faint). ?You have  chest pain. ?You have trouble breathing that: ?Is very bad. ?Gets worse with physical activity. ?You have a fast heartbeat, or a heartbeat that does not feel regular. ?You get light-headed when getting up from sitting or lying down. ?These symptoms may be an emergency. Do not wait to see if the symptoms will go away. Get medical help right away. Call your local emergency services (911 in the [place] Do not drive yourself to the hospital. ?Summary ?Iron deficiency anemia is when you have too little iron in your body. ?This condition is treated by finding out why you do not have enough iron in your body and then getting more iron. ?Take over-the-counter and prescription medicines only as told by your  doctor. ?Eat fresh fruits and vegetables that are high in vitamin C. ?Get help right away if you cannot breathe well. ?This information is not intended to replace advice given to you by your health care provider. Make sur

## 2021-04-29 ENCOUNTER — Telehealth: Payer: Self-pay

## 2021-04-29 NOTE — Telephone Encounter (Signed)
Have reached out to pt to inform of increase of hydrea to '1000mg'$  4 days a week and '500mg'$  3 days a week per Dr. Janese Banks but pt did not answer and left a VM explaining the increase. Will reach back out to pt tomorrow to see if she recieved VM. ? ?

## 2021-04-30 ENCOUNTER — Telehealth: Payer: Self-pay | Admitting: *Deleted

## 2021-04-30 NOTE — Progress Notes (Signed)
Called the assisted [facility] and spoke to [place] and she wants tinesha to call me back and she did and I told her the changes to the hydrea. It will be 4 days a week 1000 mg daily and 3 days a week will 500 mg. Faxed order to them to start as soon as they have extra meds because she is already on hydrea. Fax sent and confirmation of the fax went through ?

## 2021-04-30 NOTE — Telephone Encounter (Signed)
Called and spoke to [place] at [facility] . I wanted to see what I need to do for pt to increase more hydrea . She told me that she will have tinesha to call me back. She called back in few minutes. I told her that pt was on hydrea but her plt. Number is not what Dr. Janese Banks wants and we will increase the med. She needs a paper with the new changes and fax to them. I faxed it and got transmission with through.  ? ?New Hydrea is 1000 mg daily 4 days and 500 mg Hydrea 3 days. It was listed for how much eachday of the week sent in ?

## 2021-05-02 DIAGNOSIS — K5901 Slow transit constipation: Secondary | ICD-10-CM | POA: Diagnosis not present

## 2021-05-02 DIAGNOSIS — I129 Hypertensive chronic kidney disease with stage 1 through stage 4 chronic kidney disease, or unspecified chronic kidney disease: Secondary | ICD-10-CM | POA: Diagnosis not present

## 2021-05-02 DIAGNOSIS — D508 Other iron deficiency anemias: Secondary | ICD-10-CM | POA: Diagnosis not present

## 2021-05-02 DIAGNOSIS — N1832 Chronic kidney disease, stage 3b: Secondary | ICD-10-CM | POA: Diagnosis not present

## 2021-05-03 ENCOUNTER — Inpatient Hospital Stay: Payer: PPO

## 2021-05-03 ENCOUNTER — Other Ambulatory Visit: Payer: PPO

## 2021-05-03 VITALS — BP 124/62 | HR 79 | Temp 98.0°F | Resp 18

## 2021-05-03 DIAGNOSIS — D473 Essential (hemorrhagic) thrombocythemia: Secondary | ICD-10-CM

## 2021-05-03 DIAGNOSIS — D500 Iron deficiency anemia secondary to blood loss (chronic): Secondary | ICD-10-CM

## 2021-05-03 LAB — IRON AND TIBC
Iron: 65 ug/dL (ref 28–170)
Saturation Ratios: 16 % (ref 10.4–31.8)
TIBC: 414 ug/dL (ref 250–450)
UIBC: 349 ug/dL

## 2021-05-03 LAB — CBC WITH DIFFERENTIAL/PLATELET
Abs Immature Granulocytes: 0.05 10*3/uL (ref 0.00–0.07)
Basophils Absolute: 0 10*3/uL (ref 0.0–0.1)
Basophils Relative: 1 %
Eosinophils Absolute: 0.2 10*3/uL (ref 0.0–0.5)
Eosinophils Relative: 2 %
HCT: 35.5 % — ABNORMAL LOW (ref 36.0–46.0)
Hemoglobin: 11 g/dL — ABNORMAL LOW (ref 12.0–15.0)
Immature Granulocytes: 1 %
Lymphocytes Relative: 16 %
Lymphs Abs: 1.2 10*3/uL (ref 0.7–4.0)
MCH: 30.2 pg (ref 26.0–34.0)
MCHC: 31 g/dL (ref 30.0–36.0)
MCV: 97.5 fL (ref 80.0–100.0)
Monocytes Absolute: 0.3 10*3/uL (ref 0.1–1.0)
Monocytes Relative: 5 %
Neutro Abs: 5.5 10*3/uL (ref 1.7–7.7)
Neutrophils Relative %: 75 %
Platelets: 466 10*3/uL — ABNORMAL HIGH (ref 150–400)
RBC: 3.64 MIL/uL — ABNORMAL LOW (ref 3.87–5.11)
RDW: 19.9 % — ABNORMAL HIGH (ref 11.5–15.5)
WBC: 7.3 10*3/uL (ref 4.0–10.5)
nRBC: 0 % (ref 0.0–0.2)

## 2021-05-03 LAB — FERRITIN: Ferritin: 142 ng/mL (ref 11–307)

## 2021-05-03 MED ORDER — SODIUM CHLORIDE 0.9 % IV SOLN
510.0000 mg | INTRAVENOUS | Status: DC
  Administered 2021-05-03: 510 mg via INTRAVENOUS
  Filled 2021-05-03: qty 17

## 2021-05-03 MED ORDER — SODIUM CHLORIDE 0.9 % IV SOLN
INTRAVENOUS | Status: DC
  Filled 2021-05-03: qty 250

## 2021-05-03 NOTE — Progress Notes (Signed)
Pt declined to stay for 30 minute post infusion observation time. Stable at discharge ?

## 2021-05-08 DIAGNOSIS — R600 Localized edema: Secondary | ICD-10-CM | POA: Diagnosis not present

## 2021-05-08 DIAGNOSIS — I10 Essential (primary) hypertension: Secondary | ICD-10-CM | POA: Diagnosis not present

## 2021-05-08 DIAGNOSIS — D508 Other iron deficiency anemias: Secondary | ICD-10-CM | POA: Diagnosis not present

## 2021-05-10 ENCOUNTER — Encounter: Payer: Self-pay | Admitting: Oncology

## 2021-05-10 ENCOUNTER — Inpatient Hospital Stay: Payer: PPO

## 2021-05-10 ENCOUNTER — Inpatient Hospital Stay (HOSPITAL_BASED_OUTPATIENT_CLINIC_OR_DEPARTMENT_OTHER): Payer: PPO | Admitting: Oncology

## 2021-05-10 ENCOUNTER — Inpatient Hospital Stay: Payer: PPO | Admitting: Oncology

## 2021-05-10 ENCOUNTER — Ambulatory Visit
Admission: RE | Admit: 2021-05-10 | Discharge: 2021-05-10 | Disposition: A | Discharge status: Home / Self Care | Payer: PPO | Source: Ambulatory Visit | Attending: Oncology | Admitting: Oncology

## 2021-05-10 VITALS — BP 108/56 | HR 81 | Temp 97.7°F | Resp 14 | Wt 107.7 lb

## 2021-05-10 DIAGNOSIS — D509 Iron deficiency anemia, unspecified: Secondary | ICD-10-CM | POA: Diagnosis not present

## 2021-05-10 DIAGNOSIS — D473 Essential (hemorrhagic) thrombocythemia: Secondary | ICD-10-CM | POA: Diagnosis not present

## 2021-05-10 DIAGNOSIS — M6281 Muscle weakness (generalized): Secondary | ICD-10-CM | POA: Diagnosis not present

## 2021-05-10 DIAGNOSIS — I10 Essential (primary) hypertension: Secondary | ICD-10-CM | POA: Diagnosis not present

## 2021-05-10 DIAGNOSIS — M7989 Other specified soft tissue disorders: Secondary | ICD-10-CM

## 2021-05-10 DIAGNOSIS — Z1589 Genetic susceptibility to other disease: Secondary | ICD-10-CM

## 2021-05-10 LAB — CBC WITH DIFFERENTIAL/PLATELET
Abs Immature Granulocytes: 0.03 10*3/uL (ref 0.00–0.07)
Basophils Absolute: 0 10*3/uL (ref 0.0–0.1)
Basophils Relative: 0 %
Eosinophils Absolute: 0.1 10*3/uL (ref 0.0–0.5)
Eosinophils Relative: 1 %
HCT: 35.5 % — ABNORMAL LOW (ref 36.0–46.0)
Hemoglobin: 11 g/dL — ABNORMAL LOW (ref 12.0–15.0)
Immature Granulocytes: 0 %
Lymphocytes Relative: 16 %
Lymphs Abs: 1.2 10*3/uL (ref 0.7–4.0)
MCH: 31.1 pg (ref 26.0–34.0)
MCHC: 31 g/dL (ref 30.0–36.0)
MCV: 100.3 fL — ABNORMAL HIGH (ref 80.0–100.0)
Monocytes Absolute: 0.3 10*3/uL (ref 0.1–1.0)
Monocytes Relative: 5 %
Neutro Abs: 5.6 10*3/uL (ref 1.7–7.7)
Neutrophils Relative %: 78 %
Platelets: 737 10*3/uL — ABNORMAL HIGH (ref 150–400)
RBC: 3.54 MIL/uL — ABNORMAL LOW (ref 3.87–5.11)
RDW: 21 % — ABNORMAL HIGH (ref 11.5–15.5)
WBC: 7.3 10*3/uL (ref 4.0–10.5)
nRBC: 0 % (ref 0.0–0.2)

## 2021-05-10 NOTE — Progress Notes (Signed)
? ? ? ?Hematology/Oncology Consult note ?[place]  ?Telephone:(336) [phone] [phone] ? ?Patient Care Team: ?Derinda Late, MD as PCP - General (Family Medicine)  ? ?Name of the patient: Tina Ingram  ?[id number]  ?Feb 08, 1934  ? ?Date of visit: 05/10/21 ? ?Diagnosis- JAK2 positive essential thrombocythemia ?Iron deficiency anemia ? ?Chief complaint/ Reason for visit-routine follow-up of essential thrombocytosis and iron deficiency anemia ? ?Heme/Onc history: Patient is a 87 year old female with a past medical history significant for hypertension, osteoporosis, history of falls and C. difficile infection referred for thrombocytosis.  She was in the hospital in December 2022 for infectious colitis and was found to have platelet counts between [phone].  Looking back at her prior CBCs patient had a normal platelet count up until June 2022 although she has had a transient platelet count of 1240 in December 2020.  Over the last 6 months her platelet counts have been waxing and waning between [phone].  Patient is a resident of [facility] nursing home.  Thrombocytosis persisted despite correction of iron deficiency.  JAK2 mutation positive.  BCR ABL FISH testing Did not show any BCR abl gene rearrangement.  ESR normal. ?  ? ?Interval history-she is doing well at [facility].  Denies any specific complaints at this time other than mild chronic fatigue. ? ?ECOG PS- 2-3 ?Pain scale- 0 ? ? ?Review of systems- Review of Systems  ?Constitutional:  Positive for malaise/fatigue. Negative for chills, fever and weight loss.  ?HENT:  Negative for congestion, ear discharge and nosebleeds.   ?Eyes:  Negative for blurred vision.  ?Respiratory:  Negative for cough, hemoptysis, sputum production, shortness of breath and wheezing.   ?Cardiovascular:  Negative for chest pain, palpitations, orthopnea and claudication.  ?Gastrointestinal:  Negative for abdominal pain, blood in stool, constipation,  diarrhea, heartburn, melena, nausea and vomiting.  ?Genitourinary:  Negative for dysuria, flank pain, frequency, hematuria and urgency.  ?Musculoskeletal:  Negative for back pain, joint pain and myalgias.  ?Skin:  Negative for rash.  ?Neurological:  Negative for dizziness, tingling, focal weakness, seizures, weakness and headaches.  ?Endo/Heme/Allergies:  Does not bruise/bleed easily.  ?Psychiatric/Behavioral:  Negative for depression and suicidal ideas. The patient does not have insomnia.    ? ? ?No Known Allergies ? ? ?Past Medical History:  ?Diagnosis Date  ? Basal cell carcinoma 06/21/2020  ? left posterior shoulder  ? Breast mass, left   ? Essential thrombocytosis ([place]) 02/2021  ? Family history of adverse reaction to anesthesia   ? sister had some trouble waking up  ? Hypertension   ? Smoker   ? 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 left vertex scalp - [facility] nasal tip  ? ? ? ?Past Surgical History:  ?Procedure Laterality Date  ? APPENDECTOMY    ? BREAST BIOPSY Left 05/16/2014  ? complex sclerosing lesion   ? BREAST EXCISIONAL BIOPSY Left 2016  ? surgical exc to remove complex sclerosing lesion  ? BREAST LUMPECTOMY WITH RADIOACTIVE SEED LOCALIZATION Left 06/20/2014  ? Procedure: BREAST LUMPECTOMY WITH RADIOACTIVE SEED LOCALIZATION;  Surgeon: Erroll Luna, MD;  Location: [place];  Service: General;  Laterality: Left;  ? CAROTID ENDARTERECTOMY Right   ? CATARACT EXTRACTION W/PHACO Right 06/25/2015  ? Procedure: CATARACT EXTRACTION PHACO AND INTRAOCULAR LENS PLACEMENT (IOC);  Surgeon: Estill Cotta, MD;  Location: [facility];  Service: Ophthalmology;  Laterality: Right;  [place] 2.01 ?AP% 24.5 ?CDE 52.16 ?Fluid pack lot # [id number]  ? COLONOSCOPY    ?  COLONOSCOPY WITH PROPOFOL N/A 07/07/2020  ? Procedure: COLONOSCOPY WITH PROPOFOL;  Surgeon: Lesly Rubenstein, MD;  Location: [facility] ENDOSCOPY;  Service: Endoscopy;  Laterality: N/A;  ? ESOPHAGOGASTRODUODENOSCOPY  N/A 07/10/2020  ? Procedure: ESOPHAGOGASTRODUODENOSCOPY (EGD);  Surgeon: Jonathon Bellows, MD;  Location: [facility]OSCOPY;  Service: Gastroenterology;  Laterality: N/A;  ? ESOPHAGOGASTRODUODENOSCOPY (EGD) WITH PROPOFOL N/A 07/06/2020  ? Procedure: ESOPHAGOGASTRODUODENOSCOPY (EGD) WITH PROPOFOL;  Surgeon: [name], MD;  Location[facility] END[facility]COPY;  Service: Endoscopy;  Laterality: N/A;  ? ESOPHAGOGASTRODUODENOSCOPY (EGD) WITH PROPOFOL N/A 03/28/2021  ? Procedure: ESOPHAGOGASTRODUODENOSCOPY (EGD) WITH PROPOFOL;  Surgeon: Toledo, Benay Pike, MD;  Location: [facility] ENDOSCOPY;  Service: Gastroenterology;  Laterality: N/A;  ? EYE SURGERY    ? KYPHOPLASTY N/A 01/27/2017  ? Procedure: [id number]-V4;  Surgeon: Hessie Knows, MD;  Location: [facility]S;  Service: Orthopedics;  Laterality: N/A;  T-9 ?  ? ? ?Social History  ? ?Socioeconomic History  ? Marital status: Divorced  ?  Spouse name: Not on file  ? Number of children: Not on file  ? Years of education: Not on file  ? Highest education level: Not on file  ?Occupational History  ? Not on file  ?Tobacco Use  ? Smoking status: Every Day  ?  Packs/day: 1.00  ?  Types: Cigarettes  ? Smokeless tobacco: Never  ?Vaping Use  ? Vaping Use: Never used  ?Substance and Sexual Activity  ? Alcohol use: No  ? Drug use: Never  ? Sexual activity: Never  ?Other Topics Concern  ? Not on file  ?Social History Narrative  ? Not on file  ? ?Social Determinants of Health  ? ?Financial Resource Strain: Not on file  ?Food Insecurity: Not on file  ?Transportation Needs: Not on file  ?Physical Activity: Not on file  ?Stress: Not on file  ?Social Connections: Not on file  ?Intimate Partner Violence: Not on file  ? ? ?Family History  ?Problem Relation Age of Onset  ? Breast cancer Neg Hx   ? ? ? ?Current Outpatient Medications:  ?  acetaminophen (TYLENOL) 325 MG tablet, Take 2 tablets (650 mg total) by mouth every 6 (six) hours as needed for mild pain (or Fever >/= 101)., Disp: , Rfl:  ?  amLODipine (NORVASC) 2.5  MG tablet, Take 2.5 mg by mouth daily., Disp: , Rfl:  ?  aspirin EC 81 MG tablet, Take 81 mg by mouth daily. Swallow whole., Disp: , Rfl:  ?  busPIRone (BUSPAR) 7.5 MG tablet, Take 7.5 mg by mouth 2 (two) times daily., Disp: , Rfl:  ?  cetirizine (ZYRTEC) 10 MG tablet, Take 10 mg by mouth daily., Disp: , Rfl:  ?  Cholecalciferol (VITAMIN D3) 50 MCG (2000 UT) TABS, Take 1 capsule by mouth daily., Disp: , Rfl:  ?  hydrochlorothiazide (MICROZIDE) 12.5 MG capsule, Take 12.5 mg by mouth daily., Disp: , Rfl:  ?  hydroxyurea (HYDREA) 500 MG capsule, Take 1 capsule (500 mg total) by mouth as directed. 1 tablet tues, wed, fri. Sat.& Sun. ,  On Mon. &Thurs. Please take 2 tablets May take with food to minimize GI side effects., Disp: 38 capsule, Rfl: 2 ?  melatonin 3 MG TABS tablet, Take 3 mg by mouth at bedtime., Disp: , Rfl:  ?  Multiple Vitamin (MULTIVITAMIN) tablet, Take 1 tablet by mouth daily., Disp: , Rfl:  ?  ondansetron (ZOFRAN-ODT) 4 MG disintegrating tablet, Take 1 tablet (4 mg total) by mouth every 8 (eight) hours as needed for nausea or vomiting., Disp: 20  tablet, Rfl: 0 ?  pantoprazole (PROTONIX) 40 MG tablet, Take 1 tablet (40 mg total) by mouth 2 (two) times daily., Disp: 60 tablet, Rfl: 2 ?  senna-docusate (SENOKOT-S) 8.6-50 MG tablet, Take 1 tablet by mouth at bedtime as needed for mild constipation., Disp: , Rfl:  ?  traZODone (DESYREL) 50 MG tablet, Take 50 mg by mouth at bedtime., Disp: , Rfl:  ?  diclofenac Sodium (VOLTAREN) 1 % GEL, Apply 1 application topically 4 (four) times daily as needed (pain). (Patient not taking: Reported on 04/19/2021), Disp: , Rfl:  ? ?Physical exam:  ?Vitals:  ? 05/10/21 1035  ?Resp: 14  ?Temp: 97.7 ?F (36.5 ?C)  ?Weight: 107 lb 11.2 oz (48.9 kg)  ? ?Physical Exam ?Constitutional:   ?   General: She is not in acute distress. ?Cardiovascular:  ?   Rate and Rhythm: Normal rate and regular rhythm.  ?   Heart sounds: Normal heart sounds.  ?Pulmonary:  ?   Effort: Pulmonary effort is  normal.  ?   Breath sounds: Normal breath sounds.  ?Abdominal:  ?   General: Bowel sounds are normal.  ?   Palpations: Abdomen is soft.  ?Musculoskeletal:  ?   Comments: Left lower extremity appears mildly m

## 2021-05-14 ENCOUNTER — Telehealth: Payer: Self-pay | Admitting: *Deleted

## 2021-05-14 NOTE — Telephone Encounter (Signed)
Christy Sartorius from [facility] called to request information about the patient's last visit be faxed to the facility at [phone]. ?

## 2021-05-14 NOTE — Telephone Encounter (Signed)
Faxed the notes from [place] and next appts to [facility] attn: Christy Sartorius at fax#  [phone]. ?

## 2021-05-14 NOTE — Telephone Encounter (Signed)
I made a note that I faxed the md notes as well as the appt dates for the future ?

## 2021-05-15 DIAGNOSIS — N1832 Chronic kidney disease, stage 3b: Secondary | ICD-10-CM | POA: Diagnosis not present

## 2021-05-15 DIAGNOSIS — D508 Other iron deficiency anemias: Secondary | ICD-10-CM | POA: Diagnosis not present

## 2021-05-15 DIAGNOSIS — I10 Essential (primary) hypertension: Secondary | ICD-10-CM | POA: Diagnosis not present

## 2021-05-15 DIAGNOSIS — R600 Localized edema: Secondary | ICD-10-CM | POA: Diagnosis not present

## 2021-05-17 ENCOUNTER — Inpatient Hospital Stay: Payer: PPO | Admitting: Oncology

## 2021-05-24 ENCOUNTER — Inpatient Hospital Stay: Payer: PPO | Admitting: Oncology

## 2021-06-04 ENCOUNTER — Ambulatory Visit: Payer: PPO | Admitting: Oncology

## 2021-06-04 ENCOUNTER — Other Ambulatory Visit: Payer: PPO

## 2021-06-09 DIAGNOSIS — I10 Essential (primary) hypertension: Secondary | ICD-10-CM | POA: Diagnosis not present

## 2021-06-09 DIAGNOSIS — M6281 Muscle weakness (generalized): Secondary | ICD-10-CM | POA: Diagnosis not present

## 2021-06-12 DIAGNOSIS — R600 Localized edema: Secondary | ICD-10-CM | POA: Diagnosis not present

## 2021-06-12 DIAGNOSIS — D508 Other iron deficiency anemias: Secondary | ICD-10-CM | POA: Diagnosis not present

## 2021-06-12 DIAGNOSIS — I10 Essential (primary) hypertension: Secondary | ICD-10-CM | POA: Diagnosis not present

## 2021-06-13 ENCOUNTER — Inpatient Hospital Stay: Payer: PPO | Attending: Nurse Practitioner

## 2021-06-13 DIAGNOSIS — D473 Essential (hemorrhagic) thrombocythemia: Secondary | ICD-10-CM | POA: Diagnosis not present

## 2021-06-13 DIAGNOSIS — F17210 Nicotine dependence, cigarettes, uncomplicated: Secondary | ICD-10-CM | POA: Diagnosis not present

## 2021-06-13 DIAGNOSIS — D509 Iron deficiency anemia, unspecified: Secondary | ICD-10-CM | POA: Diagnosis not present

## 2021-06-13 LAB — COMPREHENSIVE METABOLIC PANEL
ALT: 15 U/L (ref 0–44)
AST: 19 U/L (ref 15–41)
Albumin: 4 g/dL (ref 3.5–5.0)
Alkaline Phosphatase: 53 U/L (ref 38–126)
Anion gap: 5 (ref 5–15)
BUN: 33 mg/dL — ABNORMAL HIGH (ref 8–23)
CO2: 31 mmol/L (ref 22–32)
Calcium: 9.8 mg/dL (ref 8.9–10.3)
Chloride: 102 mmol/L (ref 98–111)
Creatinine, Ser: 1.36 mg/dL — ABNORMAL HIGH (ref 0.44–1.00)
GFR, Estimated: 38 mL/min — ABNORMAL LOW (ref >60)
Glucose, Bld: 109 mg/dL — ABNORMAL HIGH (ref 70–99)
Potassium: 4.1 mmol/L (ref 3.5–5.1)
Sodium: 138 mmol/L (ref 135–145)
Total Bilirubin: 0.8 mg/dL (ref 0.3–1.2)
Total Protein: 6.8 g/dL (ref 6.5–8.1)

## 2021-06-13 LAB — CBC WITH DIFFERENTIAL/PLATELET
Abs Immature Granulocytes: 0.01 10*3/uL (ref 0.00–0.07)
Basophils Absolute: 0 10*3/uL (ref 0.0–0.1)
Basophils Relative: 1 %
Eosinophils Absolute: 0.1 10*3/uL (ref 0.0–0.5)
Eosinophils Relative: 2 %
HCT: 38.6 % (ref 36.0–46.0)
Hemoglobin: 12.4 g/dL (ref 12.0–15.0)
Immature Granulocytes: 0 %
Lymphocytes Relative: 25 %
Lymphs Abs: 0.9 10*3/uL (ref 0.7–4.0)
MCH: 33.5 pg (ref 26.0–34.0)
MCHC: 32.1 g/dL (ref 30.0–36.0)
MCV: 104.3 fL — ABNORMAL HIGH (ref 80.0–100.0)
Monocytes Absolute: 0.2 10*3/uL (ref 0.1–1.0)
Monocytes Relative: 5 %
Neutro Abs: 2.4 10*3/uL (ref 1.7–7.7)
Neutrophils Relative %: 67 %
Platelets: 501 10*3/uL — ABNORMAL HIGH (ref 150–400)
RBC: 3.7 MIL/uL — ABNORMAL LOW (ref 3.87–5.11)
RDW: 19.6 % — ABNORMAL HIGH (ref 11.5–15.5)
WBC: 3.6 10*3/uL — ABNORMAL LOW (ref 4.0–10.5)
nRBC: 0 % (ref 0.0–0.2)

## 2021-06-13 LAB — IRON AND TIBC
Iron: 82 ug/dL (ref 28–170)
Saturation Ratios: 27 % (ref 10.4–31.8)
TIBC: 304 ug/dL (ref 250–450)
UIBC: 222 ug/dL

## 2021-06-13 LAB — FERRITIN: Ferritin: 90 ng/mL (ref 11–307)

## 2021-07-09 ENCOUNTER — Other Ambulatory Visit: Payer: Self-pay

## 2021-07-09 DIAGNOSIS — D473 Essential (hemorrhagic) thrombocythemia: Secondary | ICD-10-CM

## 2021-07-09 DIAGNOSIS — D509 Iron deficiency anemia, unspecified: Secondary | ICD-10-CM

## 2021-07-10 ENCOUNTER — Inpatient Hospital Stay: Payer: PPO

## 2021-07-10 ENCOUNTER — Encounter: Payer: Self-pay | Admitting: Oncology

## 2021-07-10 ENCOUNTER — Telehealth: Payer: Self-pay

## 2021-07-10 ENCOUNTER — Inpatient Hospital Stay (HOSPITAL_BASED_OUTPATIENT_CLINIC_OR_DEPARTMENT_OTHER): Payer: PPO | Admitting: Oncology

## 2021-07-10 VITALS — BP 106/68 | HR 72 | Temp 97.3°F | Resp 14 | Wt 109.8 lb

## 2021-07-10 DIAGNOSIS — D509 Iron deficiency anemia, unspecified: Secondary | ICD-10-CM

## 2021-07-10 DIAGNOSIS — Z79899 Other long term (current) drug therapy: Secondary | ICD-10-CM | POA: Diagnosis not present

## 2021-07-10 DIAGNOSIS — D473 Essential (hemorrhagic) thrombocythemia: Secondary | ICD-10-CM

## 2021-07-10 DIAGNOSIS — Z1589 Genetic susceptibility to other disease: Secondary | ICD-10-CM

## 2021-07-10 DIAGNOSIS — D508 Other iron deficiency anemias: Secondary | ICD-10-CM | POA: Diagnosis not present

## 2021-07-10 DIAGNOSIS — I10 Essential (primary) hypertension: Secondary | ICD-10-CM | POA: Diagnosis not present

## 2021-07-10 DIAGNOSIS — D702 Other drug-induced agranulocytosis: Secondary | ICD-10-CM

## 2021-07-10 DIAGNOSIS — M6281 Muscle weakness (generalized): Secondary | ICD-10-CM | POA: Diagnosis not present

## 2021-07-10 LAB — CBC WITH DIFFERENTIAL/PLATELET
Abs Immature Granulocytes: 0.01 10*3/uL (ref 0.00–0.07)
Basophils Absolute: 0 10*3/uL (ref 0.0–0.1)
Basophils Relative: 0 %
Eosinophils Absolute: 0.1 10*3/uL (ref 0.0–0.5)
Eosinophils Relative: 2 %
HCT: 33.8 % — ABNORMAL LOW (ref 36.0–46.0)
Hemoglobin: 11.4 g/dL — ABNORMAL LOW (ref 12.0–15.0)
Immature Granulocytes: 0 %
Lymphocytes Relative: 33 %
Lymphs Abs: 0.9 10*3/uL (ref 0.7–4.0)
MCH: 35.5 pg — ABNORMAL HIGH (ref 26.0–34.0)
MCHC: 33.7 g/dL (ref 30.0–36.0)
MCV: 105.3 fL — ABNORMAL HIGH (ref 80.0–100.0)
Monocytes Absolute: 0.3 10*3/uL (ref 0.1–1.0)
Monocytes Relative: 9 %
Neutro Abs: 1.5 10*3/uL — ABNORMAL LOW (ref 1.7–7.7)
Neutrophils Relative %: 56 %
Platelets: 319 10*3/uL (ref 150–400)
RBC: 3.21 MIL/uL — ABNORMAL LOW (ref 3.87–5.11)
RDW: 19.7 % — ABNORMAL HIGH (ref 11.5–15.5)
WBC: 2.8 10*3/uL — ABNORMAL LOW (ref 4.0–10.5)
nRBC: 0 % (ref 0.0–0.2)

## 2021-07-10 LAB — COMPREHENSIVE METABOLIC PANEL
ALT: 15 U/L (ref 0–44)
AST: 19 U/L (ref 15–41)
Albumin: 3.9 g/dL (ref 3.5–5.0)
Alkaline Phosphatase: 55 U/L (ref 38–126)
Anion gap: 4 — ABNORMAL LOW (ref 5–15)
BUN: 31 mg/dL — ABNORMAL HIGH (ref 8–23)
CO2: 31 mmol/L (ref 22–32)
Calcium: 9.3 mg/dL (ref 8.9–10.3)
Chloride: 103 mmol/L (ref 98–111)
Creatinine, Ser: 1.27 mg/dL — ABNORMAL HIGH (ref 0.44–1.00)
GFR, Estimated: 42 mL/min — ABNORMAL LOW (ref >60)
Glucose, Bld: 94 mg/dL (ref 70–99)
Potassium: 4.2 mmol/L (ref 3.5–5.1)
Sodium: 138 mmol/L (ref 135–145)
Total Bilirubin: 1.1 mg/dL (ref 0.3–1.2)
Total Protein: 6.7 g/dL (ref 6.5–8.1)

## 2021-07-10 NOTE — Progress Notes (Signed)
Hematology/Oncology Consult note [facility]  Telephone:([phone] Fax:(336) [phone]  Patient Care Team: Derinda Late, MD as PCP - General (Family Medicine)   Name of the patient: Tina Ingram  [id number]  08/27/1934   Date of visit: 07/10/21  Diagnosis- JAK2 positive essential thrombocythemia Iron deficiency anemia  Chief complaint/ Reason for visit-routine follow-up of essential thrombocytosis and iron deficiency anemia  Heme/Onc history: Patient is a 87 year old female with a past medical history significant for hypertension, osteoporosis, history of falls and C. difficile infection referred for thrombocytosis.  She was in the hospital in December 2022 for infectious colitis and was found to have platelet counts between [phone].  Looking back at her prior CBCs patient had a normal platelet count up until June 2022 although she has had a transient platelet count of 1240 in December 2020.  Over the last 6 months her platelet counts have been waxing and waning between [phone].  Patient is a resident of [facility].  Thrombocytosis persisted despite correction of iron deficiency.  JAK2 mutation positive.  BCR ABL FISH testing Did not show any BCR abl gene rearrangement.  ESR normal.  Patient was started on Hydrea in April 2023.  Presently on 1000 mg 4 times a week and 500 mg 3 times a week    Interval history-she is doing well overall.  Tolerating Hydrea well without any significant side effects  ECOG PS- 2 Pain scale- 0   Review of systems- Review of Systems  Constitutional:  Positive for malaise/fatigue. Negative for chills, fever and weight loss.  HENT:  Negative for congestion, ear discharge and nosebleeds.   Eyes:  Negative for blurred vision.  Respiratory:  Negative for cough, hemoptysis, sputum production, shortness of breath and wheezing.   Cardiovascular:  Negative for chest pain, palpitations, orthopnea and claudication.   Gastrointestinal:  Negative for abdominal pain, blood in stool, constipation, diarrhea, heartburn, melena, nausea and vomiting.  Genitourinary:  Negative for dysuria, flank pain, frequency, hematuria and urgency.  Musculoskeletal:  Negative for back pain, joint pain and myalgias.  Skin:  Negative for rash.  Neurological:  Negative for dizziness, tingling, focal weakness, seizures, weakness and headaches.  Endo/Heme/Allergies:  Does not bruise/bleed easily.  Psychiatric/Behavioral:  Negative for depression and suicidal ideas. The patient does not have insomnia.       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   left vertex scalp - MOHS [facility]21/[facility] ([facility]), nasal tip     Past Surgical History:  Procedure Laterality Date   APPENDECTOMY     BREAST BIOPSY Left 05/16/2014   complex sclerosing lesion    BREAST EXCISIONAL BIOPSY Left 2016   surgical exc to remove complex sclerosing lesion   BREAST LUMPECTOMY WITH RADIOACTIVE SEED LOCALIZATION Left 06/20/2014   Procedure: BREAST LUMPECTOMY WITH RADIOACTIVE SEED LOCALIZATION;  Surgeon: Erroll Luna, MD;  Location: [place];  Service: General;  Laterality: Left;   CAROTID ENDARTERECTOMY Right    CATARACT EXTRACTION W/PHACO Right 06/25/2015   Procedure: CATARACT EXTRACTION PHACO AND INTRAOCULAR LENS PLACEMENT ([place]);  Surgeon: Estill Cotta, MD;  Location: [facility];  Service:  Ophthalmology;  Laterality: Right;  [place] 2.01 AP% 24.5 CDE 52.16 Fluid pack lot # [id number]   COLONOSCOPY     COLONOSCOPY WITH PROPOFOL N/A 07/07/2020   Procedure: COLONOSCOPY WITH PROPOFOL;  Surgeon: Lesly Rubenstein, MD;  Location: [facility]  ENDOSCOPY;  Service: Endoscopy;  Laterality: N/A;   ESOPHAGOGASTRODUODENOSCOPY N/A 07/10/2020   Procedure: ESOPHAGOGASTRODUODENOSCOPY (EGD);  Surgeon: Jonathon Bellows, MD;  Location: [facility] ENDOSCOPY;  Service: Gastroenterology;  Laterality: N/A;   ESOPHAGOGASTRODUODENOSCOPY (EGD) WITH PROPOFOL N/A 07/06/2020   Procedure: ESOPHAGOGASTRODUODENOSCOPY (EGD) WITH PROPOFOL;  Surgeon: [name], MD;  Location: [facility] ENDOSCOPY;  Service: Endoscopy;  Laterality: N/A;   ESOPHAGOGASTRODUODENOSCOPY (EGD) WITH PROPOFOL N/A 03/28/2021   Procedure: ESOPHAGOGASTRODUODENOSCOPY (EGD) WITH PROPOFOL;  Surgeon: Toledo, Benay Pike, MD;  Location: [facility] ENDOSCOPY;  Service: Gastroenterology;  Laterality: N/A;   EYE SURGERY     KYPHOPLASTY N/A 01/27/2017   Procedure: ZHYQMVHQION-G2;  Surgeon: Hessie Knows, MD;  Location: [facility] ORS;  Service: Orthopedics;  Laterality: N/A;  T-9     Social History   Socioeconomic History   Marital status: Divorced    Spouse name: Not on file   Number of children: Not on file   Years of education: Not on file   Highest education level: Not on file  Occupational History   Not on file  Tobacco Use   Smoking status: Every Day    Packs/day: 1.00    Types: Cigarettes   Smokeless tobacco: Never  Vaping Use   Vaping Use: Never used  Substance and Sexual Activity   Alcohol use: No   Drug use: Never   Sexual activity: Never  Other Topics Concern   Not on file  Social History Narrative   Not on file   Social Determinants of Health   Financial Resource Strain: Not on file  Food Insecurity: Not on file  Transportation Needs: Not on file  Physical Activity: Not on file  Stress: Not on file  Social Connections: Not on file  Intimate Partner Violence: Not on file    Family History  Problem Relation Age of Onset   Breast cancer Neg Hx      Current Outpatient Medications:    amLODipine (NORVASC) 2.5 MG tablet, Take 2.5 mg by mouth daily., Disp: , Rfl:    aspirin EC 81 MG  tablet, Take 81 mg by mouth daily. Swallow whole., Disp: , Rfl:    busPIRone (BUSPAR) 7.5 MG tablet, Take 7.5 mg by mouth 2 (two) times daily., Disp: , Rfl:    cetirizine (ZYRTEC) 10 MG tablet, Take 10 mg by mouth daily., Disp: , Rfl:    hydrochlorothiazide (MICROZIDE) 12.5 MG capsule, Take 12.5 mg by mouth daily., Disp: , Rfl:    hydroxyurea (HYDREA) 500 MG capsule, Take 1 capsule (500 mg total) by mouth as directed. 1 tablet tues, wed, fri. Sat.& Sun. ,  On Mon. &Thurs. Please take 2 tablets May take with food to minimize GI side effects., Disp: 38 capsule, Rfl: 2   iron polysaccharides (NIFEREX) 150 MG capsule, Take 150 mg by mouth daily., Disp: , Rfl:    melatonin 3 MG TABS tablet, Take 3 mg by mouth at bedtime., Disp: , Rfl:    Multiple Vitamin (MULTIVITAMIN) tablet, Take 1 tablet by mouth daily., Disp: , Rfl:    pantoprazole (PROTONIX) 40 MG tablet, Take 1 tablet (40 mg total) by mouth 2 (two) times daily., Disp: 60 tablet, Rfl: 2   senna-docusate (SENOKOT-S) 8.6-50 MG tablet, Take 1 tablet by mouth at bedtime  as needed for mild constipation., Disp: , Rfl:    traZODone (DESYREL) 50 MG tablet, Take 50 mg by mouth at bedtime., Disp: , Rfl:    acetaminophen (TYLENOL) 325 MG tablet, Take 2 tablets (650 mg total) by mouth every 6 (six) hours as needed for mild pain (or Fever >/= 101). (Patient not taking: Reported on 07/10/2021), Disp: , Rfl:    Cholecalciferol (VITAMIN D3) 50 MCG (2000 UT) TABS, Take 1 capsule by mouth daily., Disp: , Rfl:    diclofenac Sodium (VOLTAREN) 1 % GEL, Apply 1 application topically 4 (four) times daily as needed (pain). (Patient not taking: Reported on 04/19/2021), Disp: , Rfl:    ondansetron (ZOFRAN-ODT) 4 MG disintegrating tablet, Take 1 tablet (4 mg total) by mouth every 8 (eight) hours as needed for nausea or vomiting. (Patient not taking: Reported on 07/10/2021), Disp: 20 tablet, Rfl: 0  Physical exam:  Vitals:   07/10/21 1145  BP: 106/68  Pulse: 72  Resp: 14   Temp: (!) 97.3 F (36.3 C)  SpO2: 98%  Weight: 109 lb 12.8 oz (49.8 kg)   Physical Exam HENT:     Nose:     Comments: Pearly white lesion noted over the tip of the nose Cardiovascular:     Rate and Rhythm: Normal rate and regular rhythm.     Heart sounds: Normal heart sounds.  Pulmonary:     Effort: Pulmonary effort is normal.     Breath sounds: Normal breath sounds.  Abdominal:     General: Bowel sounds are normal.     Palpations: Abdomen is soft.  Skin:    General: Skin is warm and dry.  Neurological:     Mental Status: She is alert and oriented to person, place, and time.         Latest Ref Rng & Units 07/10/2021   11:26 AM  CMP  Glucose 70 - 99 mg/dL 94   BUN 8 - 23 mg/dL 31   Creatinine 0.44 - 1.00 mg/dL 1.27   Sodium 135 - 145 mmol/L 138   Potassium 3.5 - 5.1 mmol/L 4.2   Chloride 98 - 111 mmol/L 103   CO2 22 - 32 mmol/L 31   Calcium 8.9 - 10.3 mg/dL 9.3   Total Protein 6.5 - 8.1 g/dL 6.7   Total Bilirubin 0.3 - 1.2 mg/dL 1.1   Alkaline Phos 38 - 126 U/L 55   AST 15 - 41 U/L 19   ALT 0 - 44 U/L 15       Latest Ref Rng & Units 07/10/2021   11:26 AM  CBC  WBC 4.0 - 10.5 K/uL 2.8   Hemoglobin 12.0 - 15.0 g/dL 11.4   Hematocrit 36.0 - 46.0 % 33.8   Platelets 150 - 400 K/uL 319      Assessment and plan- Patient is a 87 y.o. female with high risk JAK2 positive essential thrombocytosis here for routine follow-up  Platelet are at goal less than 400 with present dose of 1000 mg 4 times a week and 500 mg 3 times a week.  However her white count is lower at 2.8 with an [place] of 1.6 likely secondary to [facility].  I have therefore asked her to reduce the dose of Hydrea to 1000 mg 2 times a week and 500 mg in the remaining 5 days a week.  We will see if we can keep her platelets at goal with that dosing and hold that her neutrophils get better  Iron deficiency  anemia: Iron studies were normal in June and I will repeated in 3 months   CBC with differential in 6 weeks  and 12 weeks and I will see her back in 12 weeks and check iron studies at that time as well  Lesion over the tip of the nose is concerning for squamous cell or basal cell.  She has an appointment coming up with dermatology soon   Visit Diagnosis 1. Essential thrombocytosis ([place])   2. JAK2 gene mutation   3. High risk medication use   4. Drug-induced neutropenia ([facility])   5. Other iron deficiency anemia      Dr. Randa Evens, MD, MPH [facility] at [facility] [phone] 07/10/2021 1:03 PM

## 2021-07-10 NOTE — Telephone Encounter (Signed)
Spoke with patient's grandson and patient was scheduled for 07/23/21 at 10:30 am. Patient's grandson will call patient and advise her of appt so she can arrange transportation. Lurlean Horns., RMA

## 2021-07-15 ENCOUNTER — Telehealth: Payer: Self-pay | Admitting: Oncology

## 2021-07-15 NOTE — Telephone Encounter (Signed)
pt transportation from [facility] facility called in to have appts adjusted

## 2021-07-23 ENCOUNTER — Ambulatory Visit: Payer: PPO | Admitting: Dermatology

## 2021-07-23 ENCOUNTER — Encounter: Payer: Self-pay | Admitting: Dermatology

## 2021-07-23 DIAGNOSIS — L905 Scar conditions and fibrosis of skin: Secondary | ICD-10-CM

## 2021-07-23 DIAGNOSIS — D0439 Carcinoma in situ of skin of other parts of face: Secondary | ICD-10-CM | POA: Diagnosis not present

## 2021-07-23 DIAGNOSIS — L578 Other skin changes due to chronic exposure to nonionizing radiation: Secondary | ICD-10-CM

## 2021-07-23 DIAGNOSIS — C4492 Squamous cell carcinoma of skin, unspecified: Secondary | ICD-10-CM

## 2021-07-23 DIAGNOSIS — C44321 Squamous cell carcinoma of skin of nose: Secondary | ICD-10-CM | POA: Diagnosis not present

## 2021-07-23 DIAGNOSIS — Z85828 Personal history of other malignant neoplasm of skin: Secondary | ICD-10-CM

## 2021-07-23 DIAGNOSIS — L570 Actinic keratosis: Secondary | ICD-10-CM | POA: Diagnosis not present

## 2021-07-23 NOTE — Patient Instructions (Addendum)
Electrodesiccation and Curettage ("Scrape and Burn") Wound Care Instructions  Leave the original bandage on for 24 hours if possible.  If the bandage becomes soaked or soiled before that time, it is OK to remove it and examine the wound.  A small amount of post-operative bleeding is normal.  If excessive bleeding occurs, remove the bandage, place gauze over the site and apply continuous pressure (no peeking) over the area for 30 minutes. If this does not work, please call our clinic as soon as possible or page your doctor if it is after hours.   Once a day, cleanse the wound with soap and water. It is fine to shower. If a thick crust develops you may use a Q-tip dipped into dilute hydrogen peroxide (mix 1:1 with water) to dissolve it.  Hydrogen peroxide can slow the healing process, so use it only as needed.    After washing, apply petroleum jelly (Vaseline) or an antibiotic ointment if your doctor prescribed one for you, followed by a bandage.    For best healing, the wound should be covered with a layer of ointment at all times. If you are not able to keep the area covered with a bandage to hold the ointment in place, this may mean re-applying the ointment several times a day.  Continue this wound care until the wound has healed and is no longer open. It may take several weeks for the wound to heal and close.  Itching and mild discomfort is normal during the healing process.  If you have any discomfort, you can take Tylenol (acetaminophen) or ibuprofen as directed on the bottle. (Please do not take these if you have an allergy to them or cannot take them for another reason).  Some redness, tenderness and white or yellow material in the wound is normal healing.  If the area becomes very sore and red, or develops a thick yellow-green material (pus), it may be infected; please notify us.    Wound healing continues for up to one year following surgery. It is not unusual to experience pain in the scar  from time to time during the interval.  If the pain becomes severe or the scar thickens, you should notify the office.    A slight amount of redness in a scar is expected for the first six months.  After six months, the redness will fade and the scar will soften and fade.  The color difference becomes less noticeable with time.  If there are any problems, return for a post-op surgery check at your earliest convenience.  To improve the appearance of the scar, you can use silicone scar gel, cream, or sheets (such as Mederma or Serica) every night for up to one year. These are available over the counter (without a prescription).  Please call our office at [phone] for any questions or concerns.  Strata GRT sample, apply thin film to nose twice daily.   Recommend daily broad spectrum sunscreen SPF 30+ to sun-exposed areas, reapply every 2 hours as needed. Call for new or changing lesions.  Staying in the shade or wearing long sleeves, sun glasses (UVA+UVB protection) and wide brim hats (4-inch brim around the entire circumference of the hat) are also recommended for sun protection.    Due to recent changes in healthcare laws, you may see results of your pathology and/or laboratory studies on MyChart before the doctors have had a chance to review them. We understand that in some cases there may be results that are  confusing or concerning to you. Please understand that not all results are received at the same time and often the doctors may need to interpret multiple results in order to provide you with the best plan of care or course of treatment. Therefore, we ask that you please give [place] 2 business days to thoroughly review all your results before contacting the office for clarification. Should we see a critical lab result, you will be contacted sooner.   If You Need Anything After Your Visit  If you have any questions or concerns for your doctor, please call our main line at [phone] and  press option 4 to reach your doctor's medical assistant. If no one answers, please leave a voicemail as directed and we will return your call as soon as possible. Messages left after 4 pm will be answered the following business day.   You may also send [place] a message via [place]. We typically respond to MyChart messages within 1-2 business days.  For prescription refills, please ask your pharmacy to contact our office. Our fax number is [phone].  If you have an urgent issue when the clinic is closed that cannot wait until the next business day, you can page your doctor at the number below.    Please note that while we do our best to be available for urgent issues outside of office hours, we are not available 24/7.   If you have an urgent issue and are unable to reach [place], you may choose to seek medical care at your doctor's office, retail clinic, urgent care center, or emergency room.  If you have a medical emergency, please immediately call 911 or go to the emergency department.  Pager Numbers  - Dr. Nehemiah Massed: [phone]  - Dr. Laurence Ferrari: [phone]  - Dr. Nicole Kindred: [phone]  In the event of inclement weather, please call our main line at [phone] for an update on the status of any delays or closures.  Dermatology Medication Tips: Please keep the boxes that topical medications come in in order to help keep track of the instructions about where and how to use these. Pharmacies typically print the medication instructions only on the boxes and not directly on the medication tubes.   If your medication is too expensive, please contact our office at [phone] option 4 or send [place] a message through [place].   We are unable to tell what your co-pay for medications will be in advance as this is different depending on your insurance coverage. However, we may be able to find a substitute medication at lower cost or fill out paperwork to get insurance to cover a needed medication.   If  a prior authorization is required to get your medication covered by your insurance company, please allow [place] 1-2 business days to complete this process.  Drug prices often vary depending on where the prescription is filled and some pharmacies may offer cheaper prices.  The website www.goodrx.com contains coupons for medications through different pharmacies. The prices here do not account for what the cost may be with help from insurance (it may be cheaper with your insurance), but the website can give you the price if you did not use any insurance.  - You can print the associated coupon and take it with your prescription to the pharmacy.  - You may also stop by our office during regular business hours and pick up a GoodRx coupon card.  - If you need your prescription sent electronically to a different pharmacy, notify our  office through [facility] or by phone at [phone] option 4.     Si Usted Necesita Algo Despus de Su Visita  Tambin puede enviarnos un mensaje a travs de Pharmacist, community. Por lo general respondemos a los mensajes de MyChart en el transcurso de 1 a 2 das hbiles.  Para renovar recetas, por favor pida a su farmacia que se ponga en contacto con nuestra oficina. Harland Dingwall de fax es [place] [phone].  Si tiene un asunto urgente cuando la clnica est cerrada y que no puede esperar hasta el siguiente da hbil, puede llamar/localizar a su doctor(a) al nmero que aparece a continuacin.   Por favor, tenga en cuenta que aunque hacemos todo lo posible para estar disponibles para asuntos urgentes fuera del horario de [place], no estamos disponibles las 24 horas del da, los 7 das de la [place].   Si tiene un problema urgente y no puede comunicarse con nosotros, puede optar por buscar atencin mdica  en el consultorio de su doctor(a), en una clnica privada, en un centro de atencin urgente o en una sala de emergencias.  Si tiene Engineering geologist, por favor llame  inmediatamente al 911 o vaya a la sala de emergencias.  Nmeros de bper  - Dr. Nehemiah Massed: [phone]  - Dra. Moye: [phone]  - Dra. Nicole Kindred: [phone]  En caso de inclemencias del [place], por favor llame a Johnsie Kindred principal al [phone] para una actualizacin sobre el [place] de cualquier retraso o cierre.  Consejos para la medicacin en dermatologa: Por favor, guarde las cajas en las que vienen los medicamentos de uso tpico para ayudarle a seguir las instrucciones sobre dnde y cmo usarlos. Las farmacias generalmente imprimen las instrucciones del medicamento slo en las cajas y no directamente en los tubos del [place].   Si su medicamento es muy caro, por favor, pngase en contacto con Zigmund Daniel llamando al [phone] y presione la opcin 4 o envenos un mensaje a travs de Pharmacist, community.   No podemos decirle cul ser su copago por los medicamentos por adelantado ya que esto es diferente dependiendo de la cobertura de su seguro. Sin embargo, es posible que podamos encontrar un medicamento sustituto a Electrical engineer un formulario para que el seguro cubra el medicamento que se considera necesario.   Si se requiere una autorizacin previa para que su compaa de seguros [place] su medicamento, por favor permtanos de 1 a 2 das hbiles para completar este proceso.  Los precios de los medicamentos varan con frecuencia dependiendo del Environmental consultant de dnde se surte la receta y alguna farmacias pueden ofrecer precios ms baratos.  El sitio web www.goodrx.com tiene cupones para medicamentos de Airline pilot. Los precios aqu no tienen en cuenta lo que podra costar con la ayuda del seguro (puede ser ms barato con su seguro), pero el sitio web puede darle el precio si no utiliz Research scientist (physical sciences).  - Puede imprimir el cupn correspondiente y llevarlo con su receta a la farmacia.  - Tambin puede pasar por nuestra oficina durante el horario de atencin regular y Charity fundraiser  una tarjeta de cupones de GoodRx.  - Si necesita que su receta se enve electrnicamente a una farmacia diferente, informe a nuestra oficina a travs de MyChart de [place] o por telfono llamando al [phone] y presione la opcin 4.

## 2021-07-23 NOTE — Progress Notes (Signed)
Follow-Up Visit   Subjective  Tina Ingram is a 87 y.o. female who presents for the following: Skin Cancer (Check SCC on nasal tip and BCC at left post shoulder. Bx 06/21/2020, have not been treated. Here for treatment. Patient has had other health issues that have prevented her from returning for treatment. Hx of SCC on left vertex scalp, Mohs 08/21/2020).  The patient has spots, moles and lesions to be evaluated, some may be new or changing and the patient has concerns that these could be cancer.  Sisters with patient.   The following portions of the chart were reviewed this encounter and updated as appropriate:  Tobacco  Allergies  Meds  Problems  Med Hx  Surg Hx  Fam Hx      Review of Systems: No other skin or systemic complaints except as noted in HPI or Assessment and Plan.   Objective  Well appearing patient in no apparent distress; mood and affect are within normal limits.  A focused examination was performed including face, scalp, left shoulder. Relevant physical exam findings are noted in the Assessment and Plan.  Left Posterior Shoulder Dyspigmented smooth macule or patch.   Left Medial Cheek Erythematous thin papules/macules with gritty scale.   Right Nasal Tip 1.3  cm keratotic pink plaque      Assessment & Plan   Actinic Damage - chronic, secondary to cumulative UV radiation exposure/sun exposure over time - diffuse scaly erythematous macules with underlying dyspigmentation - Recommend daily broad spectrum sunscreen SPF 30+ to sun-exposed areas, reapply every 2 hours as needed.  - Recommend staying in the shade or wearing long sleeves, sun glasses (UVA+UVB protection) and wide brim hats (4-inch brim around the entire circumference of the hat). - Call for new or changing lesions.   History of Squamous Cell Carcinoma of the Skin. Left vertex scalp. Mohs 08/21/2020. - No evidence of recurrence today - No lymphadenopathy - Recommend regular full body  skin exams - Recommend daily broad spectrum sunscreen SPF 30+ to sun-exposed areas, reapply every 2 hours as needed.  - Call if any new or changing lesions are noted between office visits   Scar Left Posterior Shoulder  S/P BCC  No residual BCC, no sign of recurrence.   Watch for recurrence.   AK (actinic keratosis) Left Medial Cheek  Hypertrophic AK  Actinic keratoses are precancerous spots that appear secondary to cumulative UV radiation exposure/sun exposure over time. They are chronic with expected duration over 1 year. A portion of actinic keratoses will progress to squamous cell carcinoma of the skin. It is not possible to reliably predict which spots will progress to skin cancer and so treatment is recommended to prevent development of skin cancer.  Recommend daily broad spectrum sunscreen SPF 30+ to sun-exposed areas, reapply every 2 hours as needed.  Recommend staying in the shade or wearing long sleeves, sun glasses (UVA+UVB protection) and wide brim hats (4-inch brim around the entire circumference of the hat). Call for new or changing lesions.  Destruction of lesion - Left Medial Cheek  Destruction method: cryotherapy   Informed consent: discussed and consent obtained   Lesion destroyed using liquid nitrogen: Yes   Outcome: patient tolerated procedure well with no complications   Post-procedure details: wound care instructions given   Additional details:  Prior to procedure, discussed risks of blister formation, small wound, skin dyspigmentation, or rare scar following cryotherapy. Recommend Vaseline ointment to treated areas while healing.   Squamous cell carcinoma of skin  Right Nasal Tip  Epidermal / dermal shaving  Lesion diameter (cm):  1.3 Informed consent: discussed and consent obtained   Timeout: patient name, date of birth, surgical site, and procedure verified   Procedure prep:  Patient was prepped and draped in usual sterile fashion Prep type:   Isopropyl alcohol Anesthesia: the lesion was anesthetized in a standard fashion   Anesthetic:  1% lidocaine w/ epinephrine 1-100,000 buffered w/ 8.4% NaHCO3 Instrument used: flexible razor blade   Hemostasis achieved with: pressure, aluminum chloride and electrodesiccation   Outcome: patient tolerated procedure well   Post-procedure details: sterile dressing applied and wound care instructions given   Dressing type: bandage and petrolatum    Destruction of lesion  Destruction method: electrodesiccation and curettage   Informed consent: discussed and consent obtained   Timeout:  patient name, date of birth, surgical site, and procedure verified Anesthesia: the lesion was anesthetized in a standard fashion   Anesthetic:  1% lidocaine w/ epinephrine 1-100,000 buffered w/ 8.4% NaHCO3 Curettage performed in three different directions: Yes   Electrodesiccation performed over the curetted area: Yes   Curettage cycles:  3 Lesion length (cm):  1.3 Lesion width (cm):  1.3 Margin per side (cm):  0.2 Final wound size (cm):  1.7 Hemostasis achieved with:  electrodesiccation Outcome: patient tolerated procedure well with no complications   Post-procedure details: sterile dressing applied and wound care instructions given   Dressing type: petrolatum    Specimen 1 - Surgical pathology Differential Diagnosis: R/O SCC, check differentiation   Check Margins: No  Previous Bx: [id number]  Treatment options discussed: Mohs, EDC in office.   EDC would leave a round depressed whitish scar about the same size as the original lesion.  It is treated here in office in a procedure we call "scrape and burn".  No further pathology would be performed.  Usually an 80%+ cure rate.  Mohs would be sutured and pathology would be done at time of procedure to ensure complete removal.  It has a high 90s% cure rate. We would refer patient to a specialist for Mohs surgery.   Patient prefers [place] in  office.  StrataGRT sample given to be applied twice daily.    Return in about 2 weeks (around 08/06/2021) for Wound check/SCC; TBSE in 3 months.  I, Emelia Salisbury, CMA, am acting as scribe for Forest Gleason, MD.  Documentation: I have reviewed the above documentation for accuracy and completeness, and I agree with the above.  Forest Gleason, MD

## 2021-07-24 ENCOUNTER — Telehealth: Payer: Self-pay | Admitting: *Deleted

## 2021-07-24 NOTE — Telephone Encounter (Signed)
Please call them. See my last note

## 2021-07-24 NOTE — Telephone Encounter (Signed)
Nurse from care home called for clarification regarding dosing for hydoxyurea. Please call her back. [phone]

## 2021-07-24 NOTE — Telephone Encounter (Signed)
[place] and spoke with the Nurse: clarified verbally and re-faxed orders: '1000mg'$ : (2 tablets) 2x a week (Tuesday and Thursdays) '500mg'$ :  ( 1 tablet) 5x a week (Monday,Wed,Fri,Sat,Sun)

## 2021-07-29 ENCOUNTER — Encounter: Payer: Self-pay | Admitting: Oncology

## 2021-07-29 DIAGNOSIS — I129 Hypertensive chronic kidney disease with stage 1 through stage 4 chronic kidney disease, or unspecified chronic kidney disease: Secondary | ICD-10-CM | POA: Diagnosis not present

## 2021-07-29 DIAGNOSIS — F411 Generalized anxiety disorder: Secondary | ICD-10-CM | POA: Diagnosis not present

## 2021-07-29 DIAGNOSIS — E559 Vitamin D deficiency, unspecified: Secondary | ICD-10-CM | POA: Diagnosis not present

## 2021-07-29 DIAGNOSIS — K219 Gastro-esophageal reflux disease without esophagitis: Secondary | ICD-10-CM | POA: Diagnosis not present

## 2021-07-29 DIAGNOSIS — D72829 Elevated white blood cell count, unspecified: Secondary | ICD-10-CM | POA: Diagnosis not present

## 2021-07-29 DIAGNOSIS — F331 Major depressive disorder, recurrent, moderate: Secondary | ICD-10-CM | POA: Diagnosis not present

## 2021-07-29 DIAGNOSIS — M810 Age-related osteoporosis without current pathological fracture: Secondary | ICD-10-CM | POA: Diagnosis not present

## 2021-07-29 DIAGNOSIS — F515 Nightmare disorder: Secondary | ICD-10-CM | POA: Diagnosis not present

## 2021-07-29 DIAGNOSIS — R600 Localized edema: Secondary | ICD-10-CM | POA: Diagnosis not present

## 2021-07-29 DIAGNOSIS — N1832 Chronic kidney disease, stage 3b: Secondary | ICD-10-CM | POA: Diagnosis not present

## 2021-07-29 DIAGNOSIS — D75839 Thrombocytosis, unspecified: Secondary | ICD-10-CM | POA: Diagnosis not present

## 2021-07-29 DIAGNOSIS — D508 Other iron deficiency anemias: Secondary | ICD-10-CM | POA: Diagnosis not present

## 2021-07-30 ENCOUNTER — Telehealth: Payer: Self-pay

## 2021-07-30 NOTE — Telephone Encounter (Addendum)
Tried calling patient's grandson concerning bx results. No answer. LMOM  ----- Message from Alfonso Patten, MD sent at 07/24/2021  5:01 PM EDT ----- SCCis --> treated with [facility] at her visit  MAs please call. Thank you!

## 2021-07-30 NOTE — Telephone Encounter (Signed)
-----   Message from Alfonso Patten, MD sent at 07/24/2021  5:01 PM EDT ----- SCCis --> treated with ED&C at her visit  MAs please call. Thank you!

## 2021-07-30 NOTE — Telephone Encounter (Signed)
Patient's grandson advised of BX results and to keep follow up appointment next week. aw

## 2021-07-31 ENCOUNTER — Encounter: Payer: Self-pay | Admitting: Dermatology

## 2021-08-06 ENCOUNTER — Ambulatory Visit: Payer: PPO | Admitting: Dermatology

## 2021-08-06 ENCOUNTER — Encounter: Payer: Self-pay | Admitting: Dermatology

## 2021-08-06 DIAGNOSIS — Z5189 Encounter for other specified aftercare: Secondary | ICD-10-CM

## 2021-08-06 NOTE — Progress Notes (Signed)
   Follow-Up Visit   Subjective  Tina Ingram is a 87 y.o. female who presents for the following: Wound Check (2 week recheck. S/P Bx and EDC of SCCis, 07/23/2021, at right nasal tip. Has been using StrataGRT until ran out recently. Has been using Vaseline. Patient reports area is doing very well but has been itching).  Sister with patient.   The following portions of the chart were reviewed this encounter and updated as appropriate:  Tobacco  Allergies  Meds  Problems  Med Hx  Surg Hx  Fam Hx      Review of Systems: No other skin or systemic complaints except as noted in HPI or Assessment and Plan.   Objective  Well appearing patient in no apparent distress; mood and affect are within normal limits.  A focused examination was performed including face/nose. Relevant physical exam findings are noted in the Assessment and Plan.  Right Tip of Nose Nearly healed erosion at nasal tip   Assessment & Plan  Visit for wound check Right Tip of Nose  Healing very well after ED&C for SCCis  Continue Vaseline Jelly until completely healed. Continue to wash gently with soap and water   Return in about 3 months (around 11/06/2021) for TBSE.  I, Emelia Salisbury, CMA, am acting as scribe for Forest Gleason, MD.  Documentation: I have reviewed the above documentation for accuracy and completeness, and I agree with the above.  Forest Gleason, MD

## 2021-08-06 NOTE — Patient Instructions (Signed)
Continue Vaseline Jelly until completely healed. Continue to wash gently with soap and water   Recommend daily broad spectrum sunscreen SPF 30+ to sun-exposed areas, reapply every 2 hours as needed. Call for new or changing lesions.  Staying in the shade or wearing long sleeves, sun glasses (UVA+UVB protection) and wide brim hats (4-inch brim around the entire circumference of the hat) are also recommended for sun protection.     Due to recent changes in healthcare laws, you may see results of your pathology and/or laboratory studies on MyChart before the doctors have had a chance to review them. We understand that in some cases there may be results that are confusing or concerning to you. Please understand that not all results are received at the same time and often the doctors may need to interpret multiple results in order to provide you with the best plan of care or course of treatment. Therefore, we ask that you please give [place] 2 business days to thoroughly review all your results before contacting the office for clarification. Should we see a critical lab result, you will be contacted sooner.   If You Need Anything After Your Visit  If you have any questions or concerns for your doctor, please call our main line at [phone] and press option 4 to reach your doctor's medical assistant. If no one answers, please leave a voicemail as directed and we will return your call as soon as possible. Messages left after 4 pm will be answered the following business day.   You may also send [place] a message via [place]. We typically respond to MyChart messages within 1-2 business days.  For prescription refills, please ask your pharmacy to contact our office. Our fax number is [phone].  If you have an urgent issue when the clinic is closed that cannot wait until the next business day, you can page your doctor at the number below.    Please note that while we do our best to be available for urgent  issues outside of office hours, we are not available 24/7.   If you have an urgent issue and are unable to reach [place], you may choose to seek medical care at your doctor's office, retail clinic, urgent care center, or emergency room.  If you have a medical emergency, please immediately call 911 or go to the emergency department.  Pager Numbers  - Dr. Nehemiah Massed: [phone]  - Dr. Laurence Ferrari: [phone]  - Dr. Nicole Kindred: [phone]  In the event of inclement weather, please call our main line at [phone] for an update on the status of any delays or closures.  Dermatology Medication Tips: Please keep the boxes that topical medications come in in order to help keep track of the instructions about where and how to use these. Pharmacies typically print the medication instructions only on the boxes and not directly on the medication tubes.   If your medication is too expensive, please contact our office at [phone] option 4 or send [place] a message through [place].   We are unable to tell what your co-pay for medications will be in advance as this is different depending on your insurance coverage. However, we may be able to find a substitute medication at lower cost or fill out paperwork to get insurance to cover a needed medication.   If a prior authorization is required to get your medication covered by your insurance company, please allow [place] 1-2 business days to complete this process.  Drug prices often vary depending  prescription is filled and some pharmacies may offer cheaper prices.  The website www.goodrx.com contains coupons for medications through different pharmacies. The prices here do not account for what the cost may be with help from insurance (it may be cheaper with your insurance), but the website can give you the price if you did not use any insurance.  - You can print the associated coupon and take it with your prescription to the pharmacy.  - You may also stop by our  office during regular business hours and pick up a GoodRx coupon card.  - If you need your prescription sent electronically to a different pharmacy, notify our office through [place] MyChart or by phone at [phone] option 4.     Si Usted Necesita Algo Despus de Su Visita  Tambin puede enviarnos un mensaje a travs de MyChart. Por lo general respondemos a los mensajes de MyChart en el transcurso de 1 a 2 das hbiles.  Para renovar recetas, por favor pida a su farmacia que se ponga en contacto con nuestra oficina. Nuestro nmero de fax es el [phone].  Si tiene un asunto urgente cuando la clnica est cerrada y que no puede esperar hasta el siguiente da hbil, puede llamar/localizar a su doctor(a) al nmero que aparece a continuacin.   Por favor, tenga en cuenta que aunque hacemos todo lo posible para estar disponibles para asuntos urgentes fuera del horario de oficina, no estamos disponibles las 24 horas del da, los 7 das de la semana.   Si tiene un problema urgente y no puede comunicarse con nosotros, puede optar por buscar atencin mdica  en el consultorio de su doctor(a), en una clnica privada, en un centro de atencin urgente o en una sala de emergencias.  Si tiene una emergencia mdica, por favor llame inmediatamente al 911 o vaya a la sala de emergencias.  Nmeros de bper  - Dr. Kowalski: [phone]  - Dra. Moye: [phone]  - Dra. Stewart: [phone]  En caso de inclemencias del tiempo, por favor llame a nuestra lnea principal al [phone] para una actualizacin sobre el estado de cualquier retraso o cierre.  Consejos para la medicacin en dermatologa: Por favor, guarde las cajas en las que vienen los medicamentos de uso tpico para ayudarle a seguir las instrucciones sobre dnde y cmo usarlos. Las farmacias generalmente imprimen las instrucciones del medicamento slo en las cajas y no directamente en los tubos del medicamento.   Si su  medicamento es muy caro, por favor, pngase en contacto con nuestra oficina llamando al [phone] y presione la opcin 4 o envenos un mensaje a travs de MyChart.   No podemos decirle cul ser su copago por los medicamentos por adelantado ya que esto es diferente dependiendo de la cobertura de su seguro. Sin embargo, es posible que podamos encontrar un medicamento sustituto a menor costo o llenar un formulario para que el seguro cubra el medicamento que se considera necesario.   Si se requiere una autorizacin previa para que su compaa de seguros cubra su medicamento, por favor permtanos de 1 a 2 das hbiles para completar este proceso.  Los precios de los medicamentos varan con frecuencia dependiendo del lugar de dnde se surte la receta y alguna farmacias pueden ofrecer precios ms baratos.  El sitio web www.goodrx.com tiene cupones para medicamentos de diferentes farmacias. Los precios aqu no tienen en cuenta lo que podra costar con la ayuda del seguro (puede ser ms barato con su seguro), pero el sitio web   el sitio web puede darle el precio si no Field seismologist.  - Puede imprimir el cupn correspondiente y llevarlo con su receta a la farmacia.  - Tambin puede pasar por nuestra oficina durante el horario de atencin regular y Charity fundraiser una tarjeta de cupones de GoodRx.  - Si necesita que su receta se enve electrnicamente a una farmacia diferente, informe a nuestra oficina a travs de MyChart de [facility] o por telfono llamando al [phone] y presione la opcin 4.

## 2021-08-07 DIAGNOSIS — I10 Essential (primary) hypertension: Secondary | ICD-10-CM | POA: Diagnosis not present

## 2021-08-07 DIAGNOSIS — K219 Gastro-esophageal reflux disease without esophagitis: Secondary | ICD-10-CM | POA: Diagnosis not present

## 2021-08-07 DIAGNOSIS — E559 Vitamin D deficiency, unspecified: Secondary | ICD-10-CM | POA: Diagnosis not present

## 2021-08-09 DIAGNOSIS — M6281 Muscle weakness (generalized): Secondary | ICD-10-CM | POA: Diagnosis not present

## 2021-08-09 DIAGNOSIS — I10 Essential (primary) hypertension: Secondary | ICD-10-CM | POA: Diagnosis not present

## 2021-08-21 ENCOUNTER — Other Ambulatory Visit: Payer: Self-pay

## 2021-08-21 ENCOUNTER — Inpatient Hospital Stay: Payer: PPO | Attending: Nurse Practitioner

## 2021-08-21 DIAGNOSIS — D473 Essential (hemorrhagic) thrombocythemia: Secondary | ICD-10-CM | POA: Diagnosis not present

## 2021-08-21 DIAGNOSIS — Z1589 Genetic susceptibility to other disease: Secondary | ICD-10-CM

## 2021-08-21 DIAGNOSIS — I10 Essential (primary) hypertension: Secondary | ICD-10-CM | POA: Diagnosis not present

## 2021-08-21 DIAGNOSIS — D509 Iron deficiency anemia, unspecified: Secondary | ICD-10-CM

## 2021-08-21 DIAGNOSIS — R269 Unspecified abnormalities of gait and mobility: Secondary | ICD-10-CM | POA: Diagnosis not present

## 2021-08-21 LAB — CBC WITH DIFFERENTIAL/PLATELET
Abs Immature Granulocytes: 0.03 10*3/uL (ref 0.00–0.07)
Basophils Absolute: 0 10*3/uL (ref 0.0–0.1)
Basophils Relative: 0 %
Eosinophils Absolute: 0.1 10*3/uL (ref 0.0–0.5)
Eosinophils Relative: 2 %
HCT: 26.8 % — ABNORMAL LOW (ref 36.0–46.0)
Hemoglobin: 8.9 g/dL — ABNORMAL LOW (ref 12.0–15.0)
Immature Granulocytes: 1 %
Lymphocytes Relative: 20 %
Lymphs Abs: 0.8 10*3/uL (ref 0.7–4.0)
MCH: 39.7 pg — ABNORMAL HIGH (ref 26.0–34.0)
MCHC: 33.2 g/dL (ref 30.0–36.0)
MCV: 119.6 fL — ABNORMAL HIGH (ref 80.0–100.0)
Monocytes Absolute: 0.3 10*3/uL (ref 0.1–1.0)
Monocytes Relative: 7 %
Neutro Abs: 2.8 10*3/uL (ref 1.7–7.7)
Neutrophils Relative %: 70 %
Platelets: 506 10*3/uL — ABNORMAL HIGH (ref 150–400)
RBC: 2.24 MIL/uL — ABNORMAL LOW (ref 3.87–5.11)
RDW: 17.1 % — ABNORMAL HIGH (ref 11.5–15.5)
WBC: 4 10*3/uL (ref 4.0–10.5)
nRBC: 0 % (ref 0.0–0.2)

## 2021-08-29 DIAGNOSIS — I6523 Occlusion and stenosis of bilateral carotid arteries: Secondary | ICD-10-CM | POA: Diagnosis not present

## 2021-08-29 DIAGNOSIS — I10 Essential (primary) hypertension: Secondary | ICD-10-CM | POA: Diagnosis not present

## 2021-08-29 DIAGNOSIS — R1310 Dysphagia, unspecified: Secondary | ICD-10-CM | POA: Diagnosis not present

## 2021-08-29 DIAGNOSIS — N189 Chronic kidney disease, unspecified: Secondary | ICD-10-CM | POA: Diagnosis not present

## 2021-08-29 DIAGNOSIS — M810 Age-related osteoporosis without current pathological fracture: Secondary | ICD-10-CM | POA: Diagnosis not present

## 2021-08-29 DIAGNOSIS — Z9049 Acquired absence of other specified parts of digestive tract: Secondary | ICD-10-CM | POA: Diagnosis not present

## 2021-08-29 DIAGNOSIS — D631 Anemia in chronic kidney disease: Secondary | ICD-10-CM | POA: Diagnosis not present

## 2021-08-29 DIAGNOSIS — I129 Hypertensive chronic kidney disease with stage 1 through stage 4 chronic kidney disease, or unspecified chronic kidney disease: Secondary | ICD-10-CM | POA: Diagnosis not present

## 2021-08-29 DIAGNOSIS — G8929 Other chronic pain: Secondary | ICD-10-CM | POA: Diagnosis not present

## 2021-08-29 DIAGNOSIS — F331 Major depressive disorder, recurrent, moderate: Secondary | ICD-10-CM | POA: Diagnosis not present

## 2021-08-29 DIAGNOSIS — Z9181 History of falling: Secondary | ICD-10-CM | POA: Diagnosis not present

## 2021-08-29 DIAGNOSIS — K219 Gastro-esophageal reflux disease without esophagitis: Secondary | ICD-10-CM | POA: Diagnosis not present

## 2021-08-29 DIAGNOSIS — E559 Vitamin D deficiency, unspecified: Secondary | ICD-10-CM | POA: Diagnosis not present

## 2021-08-29 DIAGNOSIS — Z87891 Personal history of nicotine dependence: Secondary | ICD-10-CM | POA: Diagnosis not present

## 2021-08-29 DIAGNOSIS — Z7982 Long term (current) use of aspirin: Secondary | ICD-10-CM | POA: Diagnosis not present

## 2021-09-04 DIAGNOSIS — I10 Essential (primary) hypertension: Secondary | ICD-10-CM | POA: Diagnosis not present

## 2021-09-04 DIAGNOSIS — K219 Gastro-esophageal reflux disease without esophagitis: Secondary | ICD-10-CM | POA: Diagnosis not present

## 2021-09-04 DIAGNOSIS — E559 Vitamin D deficiency, unspecified: Secondary | ICD-10-CM | POA: Diagnosis not present

## 2021-09-05 DIAGNOSIS — I129 Hypertensive chronic kidney disease with stage 1 through stage 4 chronic kidney disease, or unspecified chronic kidney disease: Secondary | ICD-10-CM | POA: Diagnosis not present

## 2021-09-05 DIAGNOSIS — D631 Anemia in chronic kidney disease: Secondary | ICD-10-CM | POA: Diagnosis not present

## 2021-09-05 DIAGNOSIS — M810 Age-related osteoporosis without current pathological fracture: Secondary | ICD-10-CM | POA: Diagnosis not present

## 2021-09-05 DIAGNOSIS — F331 Major depressive disorder, recurrent, moderate: Secondary | ICD-10-CM | POA: Diagnosis not present

## 2021-09-05 DIAGNOSIS — K219 Gastro-esophageal reflux disease without esophagitis: Secondary | ICD-10-CM | POA: Diagnosis not present

## 2021-09-05 DIAGNOSIS — Z9181 History of falling: Secondary | ICD-10-CM | POA: Diagnosis not present

## 2021-09-05 DIAGNOSIS — E559 Vitamin D deficiency, unspecified: Secondary | ICD-10-CM | POA: Diagnosis not present

## 2021-09-05 DIAGNOSIS — I6523 Occlusion and stenosis of bilateral carotid arteries: Secondary | ICD-10-CM | POA: Diagnosis not present

## 2021-09-05 DIAGNOSIS — Z87891 Personal history of nicotine dependence: Secondary | ICD-10-CM | POA: Diagnosis not present

## 2021-09-05 DIAGNOSIS — R1310 Dysphagia, unspecified: Secondary | ICD-10-CM | POA: Diagnosis not present

## 2021-09-05 DIAGNOSIS — Z7982 Long term (current) use of aspirin: Secondary | ICD-10-CM | POA: Diagnosis not present

## 2021-09-05 DIAGNOSIS — N189 Chronic kidney disease, unspecified: Secondary | ICD-10-CM | POA: Diagnosis not present

## 2021-09-05 DIAGNOSIS — Z9049 Acquired absence of other specified parts of digestive tract: Secondary | ICD-10-CM | POA: Diagnosis not present

## 2021-09-05 DIAGNOSIS — I10 Essential (primary) hypertension: Secondary | ICD-10-CM | POA: Diagnosis not present

## 2021-09-05 DIAGNOSIS — G8929 Other chronic pain: Secondary | ICD-10-CM | POA: Diagnosis not present

## 2021-09-09 DIAGNOSIS — I10 Essential (primary) hypertension: Secondary | ICD-10-CM | POA: Diagnosis not present

## 2021-09-09 DIAGNOSIS — M6281 Muscle weakness (generalized): Secondary | ICD-10-CM | POA: Diagnosis not present

## 2021-09-12 DIAGNOSIS — I6523 Occlusion and stenosis of bilateral carotid arteries: Secondary | ICD-10-CM | POA: Diagnosis not present

## 2021-09-12 DIAGNOSIS — N189 Chronic kidney disease, unspecified: Secondary | ICD-10-CM | POA: Diagnosis not present

## 2021-09-12 DIAGNOSIS — I129 Hypertensive chronic kidney disease with stage 1 through stage 4 chronic kidney disease, or unspecified chronic kidney disease: Secondary | ICD-10-CM | POA: Diagnosis not present

## 2021-09-13 DIAGNOSIS — K219 Gastro-esophageal reflux disease without esophagitis: Secondary | ICD-10-CM | POA: Diagnosis not present

## 2021-09-13 DIAGNOSIS — N189 Chronic kidney disease, unspecified: Secondary | ICD-10-CM | POA: Diagnosis not present

## 2021-09-13 DIAGNOSIS — Z87891 Personal history of nicotine dependence: Secondary | ICD-10-CM | POA: Diagnosis not present

## 2021-09-13 DIAGNOSIS — R1310 Dysphagia, unspecified: Secondary | ICD-10-CM | POA: Diagnosis not present

## 2021-09-13 DIAGNOSIS — I129 Hypertensive chronic kidney disease with stage 1 through stage 4 chronic kidney disease, or unspecified chronic kidney disease: Secondary | ICD-10-CM | POA: Diagnosis not present

## 2021-09-13 DIAGNOSIS — Z9181 History of falling: Secondary | ICD-10-CM | POA: Diagnosis not present

## 2021-09-13 DIAGNOSIS — G8929 Other chronic pain: Secondary | ICD-10-CM | POA: Diagnosis not present

## 2021-09-13 DIAGNOSIS — I6523 Occlusion and stenosis of bilateral carotid arteries: Secondary | ICD-10-CM | POA: Diagnosis not present

## 2021-09-13 DIAGNOSIS — F331 Major depressive disorder, recurrent, moderate: Secondary | ICD-10-CM | POA: Diagnosis not present

## 2021-09-13 DIAGNOSIS — Z9049 Acquired absence of other specified parts of digestive tract: Secondary | ICD-10-CM | POA: Diagnosis not present

## 2021-09-13 DIAGNOSIS — I10 Essential (primary) hypertension: Secondary | ICD-10-CM | POA: Diagnosis not present

## 2021-09-13 DIAGNOSIS — Z7982 Long term (current) use of aspirin: Secondary | ICD-10-CM | POA: Diagnosis not present

## 2021-09-13 DIAGNOSIS — M810 Age-related osteoporosis without current pathological fracture: Secondary | ICD-10-CM | POA: Diagnosis not present

## 2021-09-13 DIAGNOSIS — D631 Anemia in chronic kidney disease: Secondary | ICD-10-CM | POA: Diagnosis not present

## 2021-09-13 DIAGNOSIS — E559 Vitamin D deficiency, unspecified: Secondary | ICD-10-CM | POA: Diagnosis not present

## 2021-10-01 ENCOUNTER — Inpatient Hospital Stay: Payer: PPO | Attending: Nurse Practitioner

## 2021-10-01 ENCOUNTER — Ambulatory Visit: Payer: PPO | Admitting: Oncology

## 2021-10-01 ENCOUNTER — Encounter: Payer: Self-pay | Admitting: Oncology

## 2021-10-01 ENCOUNTER — Inpatient Hospital Stay (HOSPITAL_BASED_OUTPATIENT_CLINIC_OR_DEPARTMENT_OTHER): Payer: PPO | Admitting: Oncology

## 2021-10-01 ENCOUNTER — Other Ambulatory Visit: Payer: PPO

## 2021-10-01 VITALS — BP 98/57 | HR 89 | Temp 97.6°F | Resp 14 | Wt 110.0 lb

## 2021-10-01 DIAGNOSIS — Z7982 Long term (current) use of aspirin: Secondary | ICD-10-CM | POA: Insufficient documentation

## 2021-10-01 DIAGNOSIS — Z79899 Other long term (current) drug therapy: Secondary | ICD-10-CM | POA: Diagnosis not present

## 2021-10-01 DIAGNOSIS — D509 Iron deficiency anemia, unspecified: Secondary | ICD-10-CM | POA: Diagnosis not present

## 2021-10-01 DIAGNOSIS — I10 Essential (primary) hypertension: Secondary | ICD-10-CM | POA: Insufficient documentation

## 2021-10-01 DIAGNOSIS — Z1589 Genetic susceptibility to other disease: Secondary | ICD-10-CM

## 2021-10-01 DIAGNOSIS — F17210 Nicotine dependence, cigarettes, uncomplicated: Secondary | ICD-10-CM | POA: Insufficient documentation

## 2021-10-01 DIAGNOSIS — D473 Essential (hemorrhagic) thrombocythemia: Secondary | ICD-10-CM | POA: Insufficient documentation

## 2021-10-01 DIAGNOSIS — M810 Age-related osteoporosis without current pathological fracture: Secondary | ICD-10-CM | POA: Diagnosis not present

## 2021-10-01 LAB — CBC WITH DIFFERENTIAL/PLATELET
Abs Immature Granulocytes: 0.01 10*3/uL (ref 0.00–0.07)
Basophils Absolute: 0 10*3/uL (ref 0.0–0.1)
Basophils Relative: 0 %
Eosinophils Absolute: 0.1 10*3/uL (ref 0.0–0.5)
Eosinophils Relative: 2 %
HCT: 33.1 % — ABNORMAL LOW (ref 36.0–46.0)
Hemoglobin: 11.2 g/dL — ABNORMAL LOW (ref 12.0–15.0)
Immature Granulocytes: 0 %
Lymphocytes Relative: 23 %
Lymphs Abs: 1 10*3/uL (ref 0.7–4.0)
MCH: 40.6 pg — ABNORMAL HIGH (ref 26.0–34.0)
MCHC: 33.8 g/dL (ref 30.0–36.0)
MCV: 119.9 fL — ABNORMAL HIGH (ref 80.0–100.0)
Monocytes Absolute: 0.3 10*3/uL (ref 0.1–1.0)
Monocytes Relative: 6 %
Neutro Abs: 2.8 10*3/uL (ref 1.7–7.7)
Neutrophils Relative %: 69 %
Platelets: 370 10*3/uL (ref 150–400)
RBC: 2.76 MIL/uL — ABNORMAL LOW (ref 3.87–5.11)
RDW: 12.8 % (ref 11.5–15.5)
WBC: 4.1 10*3/uL (ref 4.0–10.5)
nRBC: 0 % (ref 0.0–0.2)

## 2021-10-01 LAB — COMPREHENSIVE METABOLIC PANEL
ALT: 14 U/L (ref 0–44)
AST: 18 U/L (ref 15–41)
Albumin: 4.1 g/dL (ref 3.5–5.0)
Alkaline Phosphatase: 62 U/L (ref 38–126)
Anion gap: 6 (ref 5–15)
BUN: 35 mg/dL — ABNORMAL HIGH (ref 8–23)
CO2: 30 mmol/L (ref 22–32)
Calcium: 10 mg/dL (ref 8.9–10.3)
Chloride: 104 mmol/L (ref 98–111)
Creatinine, Ser: 1.47 mg/dL — ABNORMAL HIGH (ref 0.44–1.00)
GFR, Estimated: 35 mL/min — ABNORMAL LOW (ref >60)
Glucose, Bld: 116 mg/dL — ABNORMAL HIGH (ref 70–99)
Potassium: 4.1 mmol/L (ref 3.5–5.1)
Sodium: 140 mmol/L (ref 135–145)
Total Bilirubin: 1.1 mg/dL (ref 0.3–1.2)
Total Protein: 7 g/dL (ref 6.5–8.1)

## 2021-10-01 LAB — IRON AND TIBC
Iron: 70 ug/dL (ref 28–170)
Saturation Ratios: 21 % (ref 10.4–31.8)
TIBC: 335 ug/dL (ref 250–450)
UIBC: 265 ug/dL

## 2021-10-01 LAB — FERRITIN: Ferritin: 55 ng/mL (ref 11–307)

## 2021-10-01 NOTE — Progress Notes (Signed)
Hematology/Oncology Consult note [facility]  Telephone:([phone] Fax:([phone]) [phone]  Patient Care Team: Kandyce Rud, MD as PCP - General (Family Medicine)   Name of the patient: Tina Ingram  [id number]  11-Jun-1934   Date of visit: 10/01/21  Diagnosis- JAK2 positive essential thrombocythemia Iron deficiency anemia  Chief complaint/ Reason for visit-routine follow-up of essential thrombocytosis and iron deficiency anemia  Heme/Onc history: Patient is a 87 year old female with a past medical history significant for hypertension, osteoporosis, history of falls and C. difficile infection referred for thrombocytosis.  She was in the hospital in December 2022 for infectious colitis and was found to have platelet counts between [phone].  Looking back at her prior CBCs patient had a normal platelet count up until June 2022 although she has had a transient platelet count of 1240 in December 2020.  Over the last 6 months her platelet counts have been waxing and waning between [phone].  Patient is a resident of [facility].  Thrombocytosis persisted despite correction of iron deficiency.  JAK2 mutation positive.  BCR ABL FISH testing Did not show any BCR abl gene rearrangement.  ESR normal.   Patient was started on Hydrea in April 2023.  Presently on 1000 mg 4 times a week and 500 mg 3 times a week  Interval history-patient is doing well for her age.  No recent falls.  Denies any blood loss in her stool or urine.  Has ongoing fatigue.  ECOG PS- 2 Pain scale- 0   Review of systems- Review of Systems  Constitutional:  Positive for malaise/fatigue. Negative for chills, fever and weight loss.  HENT:  Negative for congestion, ear discharge and nosebleeds.   Eyes:  Negative for blurred vision.  Respiratory:  Negative for cough, hemoptysis, sputum production, shortness of breath and wheezing.   Cardiovascular:  Negative for chest pain, palpitations,  orthopnea and claudication.  Gastrointestinal:  Negative for abdominal pain, blood in stool, constipation, diarrhea, heartburn, melena, nausea and vomiting.  Genitourinary:  Negative for dysuria, flank pain, frequency, hematuria and urgency.  Musculoskeletal:  Negative for back pain, joint pain and myalgias.  Skin:  Negative for rash.  Neurological:  Negative for dizziness, tingling, focal weakness, seizures, weakness and headaches.  Endo/Heme/Allergies:  Does not bruise/bleed easily.  Psychiatric/Behavioral:  Negative for depression and suicidal ideas. The patient does not have insomnia.       No Known Allergies   Past Medical History:  Diagnosis Date   Basal cell carcinoma 06/21/2020   left posterior shoulder. 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 [facility] 07/23/2021     Past Surgical History:  Procedure Laterality Date   APPENDECTOMY     BREAST BIOPSY Left 05/16/2014   complex sclerosing lesion    BREAST EXCISIONAL BIOPSY Left 2016   surgical exc to remove complex sclerosing lesion   BREAST LUMPECTOMY WITH RADIOACTIVE SEED LOCALIZATION Left 06/20/2014   Procedure: BREAST LUMPECTOMY WITH RADIOACTIVE SEED LOCALIZATION;  Surgeon: Harriette Bouillon, MD;  Location: [facility];  Service: General;  Laterality: Left;  CAROTID ENDARTERECTOMY Right    CATARACT EXTRACTION W/PHACO Right 06/25/2015   Procedure: CATARACT EXTRACTION PHACO AND INTRAOCULAR LENS PLACEMENT ([place]);  Surgeon: Estill Cotta, MD;  Location: [facility];  Service: Ophthalmology;  Laterality: Right;  [place] 2.01 AP% 24.5 CDE 52.16 Fluid pack  lot # [id number]   COLONOSCOPY     COLONOSCOPY WITH PROPOFOL N/A 07/07/2020   Procedure: COLONOSCOPY WITH PROPOFOL;  Surgeon: Lesly Rubenstein, MD;  Location: [facility];  Service: Endoscopy;  Laterality: N/A;   ESOPHAGOGASTRODUODENOSCOPY N/A 07/10/2020   Procedure: ESOPHAGOGASTRODUODENOSCOPY (EGD);  Surgeon: Jonathon Bellows, MD;  Location: [facility] ENDOSCOPY;  Service: Gastroenterology;  Laterality: N/A;   ESOPHAGOGASTRODUODENOSCOPY (EGD) WITH PROPOFOL N/A 07/06/2020   Procedure: ESOPHAGOGASTRODUODENOSCOPY (EGD) WITH PROPOFOL;  Surgeon: [name], MD;  Location: [facility] ENDOSCOPY;  Service: Endoscopy;  Laterality: N/A;   ESOPHAGOGASTRODUODENOSCOPY (EGD) WITH PROPOFOL N/A 03/28/2021   Procedure: ESOPHAGOGASTRODUODENOSCOPY (EGD) WITH PROPOFOL;  Surgeon: Toledo, Benay Pike, MD;  Location: [facility] ENDOSCOPY;  Service: Gastroenterology;  Laterality: N/A;   EYE SURGERY     KYPHOPLASTY N/A 01/27/2017   Procedure: [id number];  Surgeon: Hessie Knows, MD;  Location: [facility]S;  Service: Orthopedics;  Laterality: N/A;  T-9     Social History   Socioeconomic History   Marital status: Divorced    Spouse name: Not on file   Number of children: Not on file   Years of education: Not on file   Highest education level: Not on file  Occupational History   Not on file  Tobacco Use   Smoking status: Every Day    Packs/day: 1.00    Types: Cigarettes   Smokeless tobacco: Never  Vaping Use   Vaping Use: Never used  Substance and Sexual Activity   Alcohol use: No   Drug use: Never   Sexual activity: Never  Other Topics Concern   Not on file  Social History Narrative   Not on file   Social Determinants of Health   Financial Resource Strain: Not on file  Food Insecurity: Not on file  Transportation Needs: Not on file  Physical Activity: Not on file  Stress: Not on file  Social Connections: Not on file  Intimate Partner Violence: Not on file    Family History  Problem Relation Age of Onset    Breast cancer Neg Hx      Current Outpatient Medications:    amLODipine (NORVASC) 2.5 MG tablet, Take 2.5 mg by mouth daily., Disp: , Rfl:    aspirin EC 81 MG tablet, Take 81 mg by mouth daily. Swallow whole., Disp: , Rfl:    busPIRone (BUSPAR) 7.5 MG tablet, Take 7.5 mg by mouth 2 (two) times daily., Disp: , Rfl:    cetirizine (ZYRTEC) 10 MG tablet, Take 10 mg by mouth daily., Disp: , Rfl:    Cholecalciferol (VITAMIN D3) 50 MCG (2000 UT) TABS, Take 1 capsule by mouth daily., Disp: , Rfl:    hydrochlorothiazide (MICROZIDE) 12.5 MG capsule, Take 12.5 mg by mouth daily., Disp: , Rfl:    hydroxyurea (HYDREA) 500 MG capsule, Take 1 capsule (500 mg total) by mouth as directed. 1 tablet tues, wed, fri. Sat.& Sun. ,  On Mon. &Thurs. Please take 2 tablets May take with food to minimize GI side effects., Disp: 38 capsule, Rfl: 2   iron polysaccharides (NIFEREX) 150 MG capsule, Take 150 mg by mouth daily., Disp: , Rfl:    melatonin 3 MG TABS tablet, Take 3 mg by mouth at bedtime., Disp: , Rfl:  Multiple Vitamin (MULTIVITAMIN) tablet, Take 1 tablet by mouth daily., Disp: , Rfl:    pantoprazole (PROTONIX) 40 MG tablet, Take 1 tablet (40 mg total) by mouth 2 (two) times daily., Disp: 60 tablet, Rfl: 2   traZODone (DESYREL) 50 MG tablet, Take 50 mg by mouth at bedtime., Disp: , Rfl:    acetaminophen (TYLENOL) 325 MG tablet, Take 2 tablets (650 mg total) by mouth every 6 (six) hours as needed for mild pain (or Fever >/= 101). (Patient not taking: Reported on 07/10/2021), Disp: , Rfl:    diclofenac Sodium (VOLTAREN) 1 % GEL, Apply 1 application topically 4 (four) times daily as needed (pain). (Patient not taking: Reported on 04/19/2021), Disp: , Rfl:    ondansetron (ZOFRAN-ODT) 4 MG disintegrating tablet, Take 1 tablet (4 mg total) by mouth every 8 (eight) hours as needed for nausea or vomiting. (Patient not taking: Reported on 07/10/2021), Disp: 20 tablet, Rfl: 0   senna-docusate (SENOKOT-S) 8.6-50 MG tablet,  Take 1 tablet by mouth at bedtime as needed for mild constipation. (Patient not taking: Reported on 10/01/2021), Disp: , Rfl:   Physical exam:  Vitals:   10/01/21 1306  BP: (!) 98/57  Pulse: 89  Resp: 14  Temp: 97.6 F (36.4 C)  SpO2: 99%  Weight: 110 lb (49.9 kg)   Physical Exam Constitutional:      General: She is not in acute distress. Cardiovascular:     Rate and Rhythm: Normal rate and regular rhythm.     Heart sounds: Normal heart sounds.  Pulmonary:     Effort: Pulmonary effort is normal.     Breath sounds: Normal breath sounds.  Abdominal:     General: Bowel sounds are normal.     Palpations: Abdomen is soft.  Skin:    General: Skin is warm and dry.  Neurological:     Mental Status: She is alert and oriented to person, place, and time.         Latest Ref Rng & Units 10/01/2021   12:50 PM  CMP  Glucose 70 - 99 mg/dL 458   BUN 8 - 23 mg/dL 35   Creatinine 7.01 - 1.00 mg/dL 7.09   Sodium 655 - 764 mmol/L 140   Potassium 3.5 - 5.1 mmol/L 4.1   Chloride 98 - 111 mmol/L 104   CO2 22 - 32 mmol/L 30   Calcium 8.9 - 10.3 mg/dL 46.6   Total Protein 6.5 - 8.1 g/dL 7.0   Total Bilirubin 0.3 - 1.2 mg/dL 1.1   Alkaline Phos 38 - 126 U/L 62   AST 15 - 41 U/L 18   ALT 0 - 44 U/L 14       Latest Ref Rng & Units 10/01/2021   12:50 PM  CBC  WBC 4.0 - 10.5 K/uL 4.1   Hemoglobin 12.0 - 15.0 g/dL 75.7   Hematocrit 79.5 - 46.0 % 33.1   Platelets 150 - 400 K/uL 370     Assessment and plan- Patient is a 86 y.o. female who is here for follow-up of follow up of following issues:   High risk essential thrombocytosis: Currently on Hydrea.  Platelet counts are at goal less than 400.  Macrocytosis secondary to Hydrea.  She is tolerating Hydrea well without any significant side effects.  Continue to monitor.  Iron deficiency anemia: Patient received IV iron and presently hemoglobin is improved to 11.2.  Ferritin levels are normal at 55 with an iron saturation of 21%.  She does  not require any IV iron at this time.  CBC CMP ferritin and iron studies in 3 months and I will see her thereafter   Visit Diagnosis 1. Iron deficiency anemia, unspecified iron deficiency anemia type   2. Essential thrombocytosis (HCC)   3. High risk medication use      Dr. Randa Evens, MD, MPH [facility] [phone] 10/01/2021 4:30 PM

## 2021-10-03 DIAGNOSIS — E559 Vitamin D deficiency, unspecified: Secondary | ICD-10-CM | POA: Diagnosis not present

## 2021-10-03 DIAGNOSIS — I10 Essential (primary) hypertension: Secondary | ICD-10-CM | POA: Diagnosis not present

## 2021-10-03 DIAGNOSIS — K219 Gastro-esophageal reflux disease without esophagitis: Secondary | ICD-10-CM | POA: Diagnosis not present

## 2021-10-10 DIAGNOSIS — M6281 Muscle weakness (generalized): Secondary | ICD-10-CM | POA: Diagnosis not present

## 2021-10-10 DIAGNOSIS — I10 Essential (primary) hypertension: Secondary | ICD-10-CM | POA: Diagnosis not present

## 2021-10-14 DIAGNOSIS — D473 Essential (hemorrhagic) thrombocythemia: Secondary | ICD-10-CM | POA: Diagnosis not present

## 2021-10-14 DIAGNOSIS — I129 Hypertensive chronic kidney disease with stage 1 through stage 4 chronic kidney disease, or unspecified chronic kidney disease: Secondary | ICD-10-CM | POA: Diagnosis not present

## 2021-10-14 DIAGNOSIS — F17210 Nicotine dependence, cigarettes, uncomplicated: Secondary | ICD-10-CM | POA: Diagnosis not present

## 2021-10-14 DIAGNOSIS — N1832 Chronic kidney disease, stage 3b: Secondary | ICD-10-CM | POA: Diagnosis not present

## 2021-11-09 DIAGNOSIS — I10 Essential (primary) hypertension: Secondary | ICD-10-CM | POA: Diagnosis not present

## 2021-11-09 DIAGNOSIS — M6281 Muscle weakness (generalized): Secondary | ICD-10-CM | POA: Diagnosis not present

## 2021-11-12 DIAGNOSIS — Z0100 Encounter for examination of eyes and vision without abnormal findings: Secondary | ICD-10-CM | POA: Diagnosis not present

## 2021-11-12 DIAGNOSIS — H43822 Vitreomacular adhesion, left eye: Secondary | ICD-10-CM | POA: Diagnosis not present

## 2021-11-13 DIAGNOSIS — E559 Vitamin D deficiency, unspecified: Secondary | ICD-10-CM | POA: Diagnosis not present

## 2021-11-13 DIAGNOSIS — K5900 Constipation, unspecified: Secondary | ICD-10-CM | POA: Diagnosis not present

## 2021-11-13 DIAGNOSIS — I10 Essential (primary) hypertension: Secondary | ICD-10-CM | POA: Diagnosis not present

## 2021-11-13 DIAGNOSIS — K219 Gastro-esophageal reflux disease without esophagitis: Secondary | ICD-10-CM | POA: Diagnosis not present

## 2021-11-14 ENCOUNTER — Encounter: Payer: Self-pay | Admitting: Dermatology

## 2021-11-14 ENCOUNTER — Ambulatory Visit: Payer: PPO | Admitting: Dermatology

## 2021-11-14 DIAGNOSIS — Z85828 Personal history of other malignant neoplasm of skin: Secondary | ICD-10-CM

## 2021-11-14 DIAGNOSIS — L821 Other seborrheic keratosis: Secondary | ICD-10-CM

## 2021-11-14 DIAGNOSIS — L578 Other skin changes due to chronic exposure to nonionizing radiation: Secondary | ICD-10-CM

## 2021-11-14 DIAGNOSIS — L814 Other melanin hyperpigmentation: Secondary | ICD-10-CM

## 2021-11-14 DIAGNOSIS — L570 Actinic keratosis: Secondary | ICD-10-CM | POA: Diagnosis not present

## 2021-11-14 DIAGNOSIS — Z86018 Personal history of other benign neoplasm: Secondary | ICD-10-CM | POA: Diagnosis not present

## 2021-11-14 DIAGNOSIS — D229 Melanocytic nevi, unspecified: Secondary | ICD-10-CM

## 2021-11-14 DIAGNOSIS — Z1283 Encounter for screening for malignant neoplasm of skin: Secondary | ICD-10-CM

## 2021-11-14 NOTE — Patient Instructions (Addendum)
Actinic keratoses are precancerous spots that appear secondary to cumulative UV radiation exposure/sun exposure over time. They are chronic with expected duration over 1 year. A portion of actinic keratoses will progress to squamous cell carcinoma of the skin. It is not possible to reliably predict which spots will progress to skin cancer and so treatment is recommended to prevent development of skin cancer.  Recommend daily broad spectrum sunscreen SPF 30+ to sun-exposed areas, reapply every 2 hours as needed.  Recommend staying in the shade or wearing long sleeves, sun glasses (UVA+UVB protection) and wide brim hats (4-inch brim around the entire circumference of the hat). Call for new or changing lesions.   Cryotherapy Aftercare  Wash gently with soap and water everyday.   Apply Vaseline and Band-Aid daily until healed.      Melanoma ABCDEs  Melanoma is the most dangerous type of skin cancer, and is the leading cause of death from skin disease.  You are more likely to develop melanoma if you: Have light-colored skin, light-colored eyes, or red or blond hair Spend a lot of time in the sun Tan regularly, either outdoors or in a tanning bed Have had blistering sunburns, especially during childhood Have a close family member who has had a melanoma Have atypical moles or large birthmarks  Early detection of melanoma is key since treatment is typically straightforward and cure rates are extremely high if we catch it early.   The first sign of melanoma is often a change in a mole or a new dark spot.  The ABCDE system is a way of remembering the signs of melanoma.  A for asymmetry:  The two halves do not match. B for border:  The edges of the growth are irregular. C for color:  A mixture of colors are present instead of an even brown color. D for diameter:  Melanomas are usually (but not always) greater than 50m - the size of a pencil eraser. E for evolution:  The spot keeps changing in  size, shape, and color.  Please check your skin once per month between visits. You can use a small mirror in front and a large mirror behind you to keep an eye on the back side or your body.   If you see any new or changing lesions before your next follow-up, please call to schedule a visit.  Please continue daily skin protection including broad spectrum sunscreen SPF 30+ to sun-exposed areas, reapplying every 2 hours as needed when you're outdoors.   Staying in the shade or wearing long sleeves, sun glasses (UVA+UVB protection) and wide brim hats (4-inch brim around the entire circumference of the hat) are also recommended for sun protection.    Due to recent changes in healthcare laws, you may see results of your pathology and/or laboratory studies on MyChart before the doctors have had a chance to review them. We understand that in some cases there may be results that are confusing or concerning to you. Please understand that not all results are received at the same time and often the doctors may need to interpret multiple results in order to provide you with the best plan of care or course of treatment. Therefore, we ask that you please give u[place]2 business days to thoroughly review all your results before contacting the office for clarification. Should we see a critical lab result, you will be contacted sooner.   If You Need Anything After Your Visit  If you have any questions or concerns for  your doctor, please call our main line at [phone] and press option 4 to reach your doctor's medical assistant. If no one answers, please leave a voicemail as directed and we will return your call as soon as possible. Messages left after 4 pm will be answered the following business day.   You may also send [place] a message via [place]. We typically respond to MyChart messages within 1-2 business days.  For prescription refills, please ask your pharmacy to contact our office. Our fax number is  [phone].  If you have an urgent issue when the clinic is closed that cannot wait until the next business day, you can page your doctor at the number below.    Please note that while we do our best to be available for urgent issues outside of office hours, we are not available 24/7.   If you have an urgent issue and are unable to reach [place], you may choose to seek medical care at your doctor's office, retail clinic, urgent care center, or emergency room.  If you have a medical emergency, please immediately call 911 or go to the emergency department.  Pager Numbers  - Dr. Nehemiah Massed: [phone]  - Dr. Laurence Ferrari: [phone]  - Dr. Nicole Kindred: [phone]  In the event of inclement weather, please call our main line at [phone] for an update on the status of any delays or closures.  Dermatology Medication Tips: Please keep the boxes that topical medications come in in order to help keep track of the instructions about where and how to use these. Pharmacies typically print the medication instructions only on the boxes and not directly on the medication tubes.   If your medication is too expensive, please contact our office at [phone] option 4 or send [place] a message through [place].   We are unable to tell what your co-pay for medications will be in advance as this is different depending on your insurance coverage. However, we may be able to find a substitute medication at lower cost or fill out paperwork to get insurance to cover a needed medication.   If a prior authorization is required to get your medication covered by your insurance company, please allow [place] 1-2 business days to complete this process.  Drug prices often vary depending on where the prescription is filled and some pharmacies may offer cheaper prices.  The website www.goodrx.com contains coupons for medications through different pharmacies. The prices here do not account for what the cost may be with help from  insurance (it may be cheaper with your insurance), but the website can give you the price if you did not use any insurance.  - You can print the associated coupon and take it with your prescription to the pharmacy.  - You may also stop by our office during regular business hours and pick up a GoodRx coupon card.  - If you need your prescription sent electronically to a different pharmacy, notify our office through [facility] or by phone at [phone] option 4.     Si Usted Necesita Algo Despus de Su Visita  Tambin puede enviarnos un mensaje a travs de Pharmacist, community. Por lo general respondemos a los mensajes de MyChart en el transcurso de 1 a 2 das hbiles.  Para renovar recetas, por favor pida a su farmacia que se ponga en contacto con nuestra oficina. Harland Dingwall de fax es [place] [phone].  Si tiene un asunto urgente cuando la clnica est cerrada y que no puede esperar Museum/gallery curator  siguiente da hbil, puede llamar/localizar a su doctor(a) al nmero que aparece a continuacin.   Por favor, tenga en cuenta que aunque hacemos todo lo posible para estar disponibles para asuntos urgentes fuera del horario de [place], no estamos disponibles las 24 horas del da, los 7 das de la [place].   Si tiene un problema urgente y no puede comunicarse con nosotros, puede optar por buscar atencin mdica  en el consultorio de su doctor(a), en una clnica privada, en un centro de atencin urgente o en una sala de emergencias.  Si tiene Engineering geologist, por favor llame inmediatamente al 911 o vaya a la sala de emergencias.  Nmeros de bper  - Dr. Nehemiah Massed: [phone]  - Dra. Moye: [phone]  - Dra. Nicole Kindred: [phone]  En caso de inclemencias del [place], por favor llame a Johnsie Kindred principal al [phone] para una actualizacin sobre el [place] de cualquier retraso o cierre.  Consejos para la medicacin en dermatologa: Por favor, guarde las cajas en las que vienen los  medicamentos de uso tpico para ayudarle a seguir las instrucciones sobre dnde y cmo usarlos. Las farmacias generalmente imprimen las instrucciones del medicamento slo en las cajas y no directamente en los tubos del [place].   Si su medicamento es muy caro, por favor, pngase en contacto con Zigmund Daniel llamando al [phone] y presione la opcin 4 o envenos un mensaje a travs de Pharmacist, community.   No podemos decirle cul ser su copago por los medicamentos por adelantado ya que esto es diferente dependiendo de la cobertura de su seguro. Sin embargo, es posible que podamos encontrar un medicamento sustituto a Electrical engineer un formulario para que el seguro cubra el medicamento que se considera necesario.   Si se requiere una autorizacin previa para que su compaa de seguros [place] su medicamento, por favor permtanos de 1 a 2 das hbiles para completar este proceso.  Los precios de los medicamentos varan con frecuencia dependiendo del Environmental consultant de dnde se surte la receta y alguna farmacias pueden ofrecer precios ms baratos.  El sitio web www.goodrx.com tiene cupones para medicamentos de Airline pilot. Los precios aqu no tienen en cuenta lo que podra costar con la ayuda del seguro (puede ser ms barato con su seguro), pero el sitio web puede darle el precio si no utiliz Research scientist (physical sciences).  - Puede imprimir el cupn correspondiente y llevarlo con su receta a la farmacia.  - Tambin puede pasar por nuestra oficina durante el horario de atencin regular y Charity fundraiser una tarjeta de cupones de GoodRx.  - Si necesita que su receta se enve electrnicamente a una farmacia diferente, informe a nuestra oficina a travs de MyChart de [place] o por telfono llamando al [phone] y presione la opcin 4.

## 2021-11-14 NOTE — Progress Notes (Signed)
Follow-Up Visit   Subjective  Tina Ingram is a 87 y.o. female who presents for the following: Annual Exam (Tbse hx of bcc hx scc . Patient reports spot at her nose to recheck. ).  The patient presents for Total-Body Skin Exam (TBSE) for skin cancer screening and mole check.  The patient has spots, moles and lesions to be evaluated, some may be new or changing and the patient has concerns that these could be cancer.   The following portions of the chart were reviewed this encounter and updated as appropriate:  Tobacco  Allergies  Meds  Problems  Med Hx  Surg Hx  Fam Hx      Review of Systems: No other skin or systemic complaints except as noted in HPI or Assessment and Plan.   Objective  Well appearing patient in no apparent distress; mood and affect are within normal limits.  A full examination was performed including scalp, head, eyes, ears, nose, lips, neck, chest, axillae, abdomen, back, buttocks, bilateral upper extremities, bilateral lower extremities, hands, feet, fingers, toes, fingernails, and toenails. All findings within normal limits unless otherwise noted below.  right border of nare x 1, left medial cheek x 1, upper mid chest x 1, right forehead x 1 (4) Erythematous thin papules/macules with gritty scale.    Assessment & Plan   History of Basal Cell Carcinoma of the Skin. Left post shoulder. 07/23/2021 - No evidence of recurrence today - Recommend regular full body skin exams - Recommend daily broad spectrum sunscreen SPF 30+ to sun-exposed areas, reapply every 2 hours as needed.  - Call if any new or changing lesions are noted between office visits   History of Squamous Cell Carcinoma in Situ of the Skin. Left vertex scalp, Mohs 08/21/2020, right nasal tip EDC 07/23/2021 - No evidence of recurrence today - Recommend regular full body skin exams - Recommend daily broad spectrum sunscreen SPF 30+ to sun-exposed areas, reapply every 2 hours as needed.  - Call  if any new or changing lesions are noted between office visits   Lentigines - Scattered tan macules - Due to sun exposure - Benign-appearing, observe - Recommend daily broad spectrum sunscreen SPF 30+ to sun-exposed areas, reapply every 2 hours as needed. - Call for any changes  Seborrheic Keratoses - Stuck-on, waxy, tan-brown papules and/or plaques  - Benign-appearing - Discussed benign etiology and prognosis. - Observe - Call for any changes  Melanocytic Nevi - Tan-brown and/or pink-flesh-colored symmetric macules and papules - Benign appearing on exam today - Observation - Call clinic for new or changing moles - Recommend daily use of broad spectrum spf 30+ sunscreen to sun-exposed areas.   Hemangiomas - Red papules - Discussed benign nature - Observe - Call for any changes  Actinic Damage - Chronic condition, secondary to cumulative UV/sun exposure - diffuse scaly erythematous macules with underlying dyspigmentation - Recommend daily broad spectrum sunscreen SPF 30+ to sun-exposed areas, reapply every 2 hours as needed.  - Staying in the shade or wearing long sleeves, sun glasses (UVA+UVB protection) and wide brim hats (4-inch brim around the entire circumference of the hat) are also recommended for sun protection.  - Call for new or changing lesions.  Skin cancer screening performed today.  Actinic keratosis (4) right border of nare x 1, left medial cheek x 1, upper mid chest x 1, right forehead x 1  Will recheck right border of nare at next follow up, may consider bx if not resolved.  Actinic keratoses are precancerous spots that appear secondary to cumulative UV radiation exposure/sun exposure over time. They are chronic with expected duration over 1 year. A portion of actinic keratoses will progress to squamous cell carcinoma of the skin. It is not possible to reliably predict which spots will progress to skin cancer and so treatment is recommended to prevent  development of skin cancer.  Recommend daily broad spectrum sunscreen SPF 30+ to sun-exposed areas, reapply every 2 hours as needed.  Recommend staying in the shade or wearing long sleeves, sun glasses (UVA+UVB protection) and wide brim hats (4-inch brim around the entire circumference of the hat). Call for new or changing lesions.  Destruction of lesion - right border of nare x 1, left medial cheek x 1, upper mid chest x 1, right forehead x 1  Destruction method: cryotherapy   Informed consent: discussed and consent obtained   Lesion destroyed using liquid nitrogen: Yes   Cryotherapy cycles:  2 Outcome: patient tolerated procedure well with no complications   Post-procedure details: wound care instructions given   Additional details:  Prior to procedure, discussed risks of blister formation, small wound, skin dyspigmentation, or rare scar following cryotherapy. Recommend Vaseline ointment to treated areas while healing.    Return for 1 - 2  month recheck aks  and lower body exam.  I, Emelia Salisbury, CMA, am acting as scribe for Forest Gleason, MD.  Documentation: I have reviewed the above documentation for accuracy and completeness, and I agree with the above.  Forest Gleason, MD

## 2021-11-18 ENCOUNTER — Encounter: Payer: Self-pay | Admitting: Dermatology

## 2021-12-12 DIAGNOSIS — D473 Essential (hemorrhagic) thrombocythemia: Secondary | ICD-10-CM | POA: Diagnosis not present

## 2021-12-12 DIAGNOSIS — M810 Age-related osteoporosis without current pathological fracture: Secondary | ICD-10-CM | POA: Diagnosis not present

## 2021-12-25 DIAGNOSIS — D509 Iron deficiency anemia, unspecified: Secondary | ICD-10-CM | POA: Diagnosis not present

## 2021-12-25 DIAGNOSIS — E559 Vitamin D deficiency, unspecified: Secondary | ICD-10-CM | POA: Diagnosis not present

## 2021-12-25 DIAGNOSIS — I10 Essential (primary) hypertension: Secondary | ICD-10-CM | POA: Diagnosis not present

## 2021-12-25 DIAGNOSIS — K219 Gastro-esophageal reflux disease without esophagitis: Secondary | ICD-10-CM | POA: Diagnosis not present

## 2021-12-25 DIAGNOSIS — K5900 Constipation, unspecified: Secondary | ICD-10-CM | POA: Diagnosis not present

## 2021-12-31 ENCOUNTER — Encounter: Payer: Self-pay | Admitting: Oncology

## 2021-12-31 ENCOUNTER — Inpatient Hospital Stay: Payer: PPO | Attending: Nurse Practitioner | Admitting: Oncology

## 2021-12-31 ENCOUNTER — Inpatient Hospital Stay: Payer: PPO

## 2022-01-02 DIAGNOSIS — K5903 Drug induced constipation: Secondary | ICD-10-CM | POA: Diagnosis not present

## 2022-01-02 DIAGNOSIS — F331 Major depressive disorder, recurrent, moderate: Secondary | ICD-10-CM | POA: Diagnosis not present

## 2022-01-02 DIAGNOSIS — D72829 Elevated white blood cell count, unspecified: Secondary | ICD-10-CM | POA: Diagnosis not present

## 2022-01-02 DIAGNOSIS — N1832 Chronic kidney disease, stage 3b: Secondary | ICD-10-CM | POA: Diagnosis not present

## 2022-01-02 DIAGNOSIS — D508 Other iron deficiency anemias: Secondary | ICD-10-CM | POA: Diagnosis not present

## 2022-01-02 DIAGNOSIS — I129 Hypertensive chronic kidney disease with stage 1 through stage 4 chronic kidney disease, or unspecified chronic kidney disease: Secondary | ICD-10-CM | POA: Diagnosis not present

## 2022-01-02 DIAGNOSIS — D75838 Other thrombocytosis: Secondary | ICD-10-CM | POA: Diagnosis not present

## 2022-01-02 DIAGNOSIS — Z720 Tobacco use: Secondary | ICD-10-CM | POA: Diagnosis not present

## 2022-01-03 DIAGNOSIS — D473 Essential (hemorrhagic) thrombocythemia: Secondary | ICD-10-CM | POA: Diagnosis not present

## 2022-01-03 DIAGNOSIS — M810 Age-related osteoporosis without current pathological fracture: Secondary | ICD-10-CM | POA: Diagnosis not present

## 2022-01-15 DIAGNOSIS — K5900 Constipation, unspecified: Secondary | ICD-10-CM | POA: Diagnosis not present

## 2022-01-15 DIAGNOSIS — R3915 Urgency of urination: Secondary | ICD-10-CM | POA: Diagnosis not present

## 2022-01-15 DIAGNOSIS — I10 Essential (primary) hypertension: Secondary | ICD-10-CM | POA: Diagnosis not present

## 2022-01-15 DIAGNOSIS — K219 Gastro-esophageal reflux disease without esophagitis: Secondary | ICD-10-CM | POA: Diagnosis not present

## 2022-01-15 DIAGNOSIS — D508 Other iron deficiency anemias: Secondary | ICD-10-CM | POA: Diagnosis not present

## 2022-01-15 DIAGNOSIS — E559 Vitamin D deficiency, unspecified: Secondary | ICD-10-CM | POA: Diagnosis not present

## 2022-01-22 ENCOUNTER — Encounter: Payer: Self-pay | Admitting: Oncology

## 2022-01-23 ENCOUNTER — Ambulatory Visit (INDEPENDENT_AMBULATORY_CARE_PROVIDER_SITE_OTHER): Payer: PPO | Admitting: Dermatology

## 2022-01-23 DIAGNOSIS — L821 Other seborrheic keratosis: Secondary | ICD-10-CM | POA: Diagnosis not present

## 2022-01-23 DIAGNOSIS — L570 Actinic keratosis: Secondary | ICD-10-CM

## 2022-01-23 DIAGNOSIS — L814 Other melanin hyperpigmentation: Secondary | ICD-10-CM | POA: Diagnosis not present

## 2022-01-23 NOTE — Progress Notes (Signed)
   Follow-Up Visit   Subjective  Tina Ingram is a 88 y.o. female who presents for the following: Follow-up (Patient here today for AK follow up. Patient defers lower body exam. ).  Areas treated at right border of nare, left medial cheek, upper mid chest, right forehead.  The following portions of the chart were reviewed this encounter and updated as appropriate:   Tobacco  Allergies  Meds  Problems  Med Hx  Surg Hx  Fam Hx      Review of Systems:  No other skin or systemic complaints except as noted in HPI or Assessment and Plan.  Objective  Well appearing patient in no apparent distress; mood and affect are within normal limits.  A focused examination was performed including face, lower legs, chest. Relevant physical exam findings are noted in the Assessment and Plan.  upper mid chest x 2, left medial cheek x 1, right nasal dorsum x 1, right nasal supratip x 1 (5) Erythematous thin papules/macules with gritty scale.     Assessment & Plan  AK (actinic keratosis) (5) upper mid chest x 2, left medial cheek x 1, right nasal dorsum x 1, right nasal supratip x 1  Actinic keratoses are precancerous spots that appear secondary to cumulative UV radiation exposure/sun exposure over time. They are chronic with expected duration over 1 year. A portion of actinic keratoses will progress to squamous cell carcinoma of the skin. It is not possible to reliably predict which spots will progress to skin cancer and so treatment is recommended to prevent development of skin cancer.  Recommend daily broad spectrum sunscreen SPF 30+ to sun-exposed areas, reapply every 2 hours as needed.  Recommend staying in the shade or wearing long sleeves, sun glasses (UVA+UVB protection) and wide brim hats (4-inch brim around the entire circumference of the hat). Call for new or changing lesions.  Prior to procedure, discussed risks of blister formation, small wound, skin dyspigmentation, or rare scar  following cryotherapy. Recommend Vaseline ointment to treated areas while healing.   Destruction of lesion - upper mid chest x 2, left medial cheek x 1, right nasal dorsum x 1, right nasal supratip x 1  Destruction method: cryotherapy   Informed consent: discussed and consent obtained   Lesion destroyed using liquid nitrogen: Yes   Cryotherapy cycles:  2 Outcome: patient tolerated procedure well with no complications   Post-procedure details: wound care instructions given     Lentigines - Scattered tan macules - Due to sun exposure - Benign-appearing, observe - Recommend daily broad spectrum sunscreen SPF 30+ to sun-exposed areas, reapply every 2 hours as needed. - Call for any changes  Seborrheic Keratoses - Stuck-on, waxy, tan-brown papules and/or plaques  - Benign-appearing - Discussed benign etiology and prognosis. - Observe - Call for any changes  Return for AK follow up 3-4 months.  Graciella Belton, RMA, am acting as scribe for Forest Gleason, MD .  Documentation: I have reviewed the above documentation for accuracy and completeness, and I agree with the above.  Forest Gleason, MD

## 2022-01-23 NOTE — Patient Instructions (Signed)
Cryotherapy Aftercare  Wash gently with soap and water everyday.   Apply Vaseline and Band-Aid daily until healed.     Due to recent changes in healthcare laws, you may see results of your pathology and/or laboratory studies on MyChart before the doctors have had a chance to review them. We understand that in some cases there may be results that are confusing or concerning to you. Please understand that not all results are received at the same time and often the doctors may need to interpret multiple results in order to provide you with the best plan of care or course of treatment. Therefore, we ask that you please give us 2 business days to thoroughly review all your results before contacting the office for clarification. Should we see a critical lab result, you will be contacted sooner.   If You Need Anything After Your Visit  If you have any questions or concerns for your doctor, please call our main line at [phone] and press option 4 to reach your doctor's medical assistant. If no one answers, please leave a voicemail as directed and we will return your call as soon as possible. Messages left after 4 pm will be answered the following business day.   You may also send us a message via MyChart. We typically respond to MyChart messages within 1-2 business days.  For prescription refills, please ask your pharmacy to contact our office. Our fax number is [phone].  If you have an urgent issue when the clinic is closed that cannot wait until the next business day, you can page your doctor at the number below.    Please note that while we do our best to be available for urgent issues outside of office hours, we are not available 24/7.   If you have an urgent issue and are unable to reach us, you may choose to seek medical care at your doctor's office, retail clinic, urgent care center, or emergency room.  If you have a medical emergency, please immediately call 911 or go to the  emergency department.  Pager Numbers  - Dr. Kowalski: [phone]  - Dr. Moye: [phone]  - Dr. Stewart: [phone]  In the event of inclement weather, please call our main line at [phone] for an update on the status of any delays or closures.  Dermatology Medication Tips: Please keep the boxes that topical medications come in in order to help keep track of the instructions about where and how to use these. Pharmacies typically print the medication instructions only on the boxes and not directly on the medication tubes.   If your medication is too expensive, please contact our office at [phone] option 4 or send us a message through MyChart.   We are unable to tell what your co-pay for medications will be in advance as this is different depending on your insurance coverage. However, we may be able to find a substitute medication at lower cost or fill out paperwork to get insurance to cover a needed medication.   If a prior authorization is required to get your medication covered by your insurance company, please allow us 1-2 business days to complete this process.  Drug prices often vary depending on where the prescription is filled and some pharmacies may offer cheaper prices.  The website www.goodrx.com contains coupons for medications through different pharmacies. The prices here do not account for what the cost may be with help from insurance (it may be cheaper with your insurance), but the website can   give you the price if you did not use any insurance.  - You can print the associated coupon and take it with your prescription to the pharmacy.  - You may also stop by our office during regular business hours and pick up a GoodRx coupon card.  - If you need your prescription sent electronically to a different pharmacy, notify our office through [place] MyChart or by phone at [phone] option 4.     Si Usted Necesita Algo Despus de Su Visita  Tambin puede  enviarnos un mensaje a travs de MyChart. Por lo general respondemos a los mensajes de MyChart en el transcurso de 1 a 2 das hbiles.  Para renovar recetas, por favor pida a su farmacia que se ponga en contacto con nuestra oficina. Nuestro nmero de fax es el [phone].  Si tiene un asunto urgente cuando la clnica est cerrada y que no puede esperar hasta el siguiente da hbil, puede llamar/localizar a su doctor(a) al nmero que aparece a continuacin.   Por favor, tenga en cuenta que aunque hacemos todo lo posible para estar disponibles para asuntos urgentes fuera del horario de oficina, no estamos disponibles las 24 horas del da, los 7 das de la semana.   Si tiene un problema urgente y no puede comunicarse con nosotros, puede optar por buscar atencin mdica  en el consultorio de su doctor(a), en una clnica privada, en un centro de atencin urgente o en una sala de emergencias.  Si tiene una emergencia mdica, por favor llame inmediatamente al 911 o vaya a la sala de emergencias.  Nmeros de bper  - Dr. Kowalski: [phone]  - Dra. Moye: [phone]  - Dra. Stewart: [phone]  En caso de inclemencias del tiempo, por favor llame a nuestra lnea principal al [phone] para una actualizacin sobre el estado de cualquier retraso o cierre.  Consejos para la medicacin en dermatologa: Por favor, guarde las cajas en las que vienen los medicamentos de uso tpico para ayudarle a seguir las instrucciones sobre dnde y cmo usarlos. Las farmacias generalmente imprimen las instrucciones del medicamento slo en las cajas y no directamente en los tubos del medicamento.   Si su medicamento es muy caro, por favor, pngase en contacto con nuestra oficina llamando al [phone] y presione la opcin 4 o envenos un mensaje a travs de MyChart.   No podemos decirle cul ser su copago por los medicamentos por adelantado ya que esto es diferente dependiendo de la cobertura de su seguro.  Sin embargo, es posible que podamos encontrar un medicamento sustituto a menor costo o llenar un formulario para que el seguro cubra el medicamento que se considera necesario.   Si se requiere una autorizacin previa para que su compaa de seguros cubra su medicamento, por favor permtanos de 1 a 2 das hbiles para completar este proceso.  Los precios de los medicamentos varan con frecuencia dependiendo del lugar de dnde se surte la receta y alguna farmacias pueden ofrecer precios ms baratos.  El sitio web www.goodrx.com tiene cupones para medicamentos de diferentes farmacias. Los precios aqu no tienen en cuenta lo que podra costar con la ayuda del seguro (puede ser ms barato con su seguro), pero el sitio web puede darle el precio si no utiliz ningn seguro.  - Puede imprimir el cupn correspondiente y llevarlo con su receta a la farmacia.  - Tambin puede pasar por nuestra oficina durante el horario de atencin regular y recoger una tarjeta de cupones de GoodRx.  -   Si necesita que su receta se enve electrnicamente a una farmacia diferente, informe a nuestra oficina a travs de MyChart de [place] o por telfono llamando al [phone] y presione la opcin 4.  

## 2022-02-03 ENCOUNTER — Encounter: Payer: Self-pay | Admitting: Dermatology

## 2022-02-05 ENCOUNTER — Ambulatory Visit: Payer: PPO | Admitting: Podiatry

## 2022-02-12 DIAGNOSIS — D508 Other iron deficiency anemias: Secondary | ICD-10-CM | POA: Diagnosis not present

## 2022-02-12 DIAGNOSIS — D473 Essential (hemorrhagic) thrombocythemia: Secondary | ICD-10-CM | POA: Diagnosis not present

## 2022-02-12 DIAGNOSIS — K5900 Constipation, unspecified: Secondary | ICD-10-CM | POA: Diagnosis not present

## 2022-02-12 DIAGNOSIS — M810 Age-related osteoporosis without current pathological fracture: Secondary | ICD-10-CM | POA: Diagnosis not present

## 2022-02-12 DIAGNOSIS — E559 Vitamin D deficiency, unspecified: Secondary | ICD-10-CM | POA: Diagnosis not present

## 2022-02-12 DIAGNOSIS — R3915 Urgency of urination: Secondary | ICD-10-CM | POA: Diagnosis not present

## 2022-02-12 DIAGNOSIS — K219 Gastro-esophageal reflux disease without esophagitis: Secondary | ICD-10-CM | POA: Diagnosis not present

## 2022-02-12 DIAGNOSIS — I10 Essential (primary) hypertension: Secondary | ICD-10-CM | POA: Diagnosis not present

## 2022-02-19 ENCOUNTER — Inpatient Hospital Stay (HOSPITAL_BASED_OUTPATIENT_CLINIC_OR_DEPARTMENT_OTHER): Payer: PPO | Admitting: Oncology

## 2022-02-19 ENCOUNTER — Encounter: Payer: Self-pay | Admitting: Oncology

## 2022-02-19 ENCOUNTER — Inpatient Hospital Stay: Payer: PPO | Attending: Nurse Practitioner

## 2022-02-19 ENCOUNTER — Telehealth: Payer: Self-pay | Admitting: *Deleted

## 2022-02-19 VITALS — BP 127/74 | HR 80 | Temp 96.0°F | Resp 17 | Wt 109.0 lb

## 2022-02-19 DIAGNOSIS — D473 Essential (hemorrhagic) thrombocythemia: Secondary | ICD-10-CM | POA: Diagnosis not present

## 2022-02-19 DIAGNOSIS — M810 Age-related osteoporosis without current pathological fracture: Secondary | ICD-10-CM | POA: Diagnosis not present

## 2022-02-19 DIAGNOSIS — I10 Essential (primary) hypertension: Secondary | ICD-10-CM | POA: Insufficient documentation

## 2022-02-19 DIAGNOSIS — F17210 Nicotine dependence, cigarettes, uncomplicated: Secondary | ICD-10-CM | POA: Diagnosis not present

## 2022-02-19 DIAGNOSIS — Z79899 Other long term (current) drug therapy: Secondary | ICD-10-CM | POA: Diagnosis not present

## 2022-02-19 DIAGNOSIS — D509 Iron deficiency anemia, unspecified: Secondary | ICD-10-CM

## 2022-02-19 DIAGNOSIS — Z7982 Long term (current) use of aspirin: Secondary | ICD-10-CM | POA: Insufficient documentation

## 2022-02-19 DIAGNOSIS — R5383 Other fatigue: Secondary | ICD-10-CM | POA: Insufficient documentation

## 2022-02-19 LAB — CBC WITH DIFFERENTIAL/PLATELET
Abs Immature Granulocytes: 0.01 10*3/uL (ref 0.00–0.07)
Basophils Absolute: 0 10*3/uL (ref 0.0–0.1)
Basophils Relative: 0 %
Eosinophils Absolute: 0 10*3/uL (ref 0.0–0.5)
Eosinophils Relative: 1 %
HCT: 35.5 % — ABNORMAL LOW (ref 36.0–46.0)
Hemoglobin: 11.6 g/dL — ABNORMAL LOW (ref 12.0–15.0)
Immature Granulocytes: 0 %
Lymphocytes Relative: 27 %
Lymphs Abs: 1.1 10*3/uL (ref 0.7–4.0)
MCH: 38.9 pg — ABNORMAL HIGH (ref 26.0–34.0)
MCHC: 32.7 g/dL (ref 30.0–36.0)
MCV: 119.1 fL — ABNORMAL HIGH (ref 80.0–100.0)
Monocytes Absolute: 0.3 10*3/uL (ref 0.1–1.0)
Monocytes Relative: 8 %
Neutro Abs: 2.7 10*3/uL (ref 1.7–7.7)
Neutrophils Relative %: 64 %
Platelets: 341 10*3/uL (ref 150–400)
RBC: 2.98 MIL/uL — ABNORMAL LOW (ref 3.87–5.11)
RDW: 13.7 % (ref 11.5–15.5)
WBC: 4.1 10*3/uL (ref 4.0–10.5)
nRBC: 0 % (ref 0.0–0.2)

## 2022-02-19 LAB — IRON AND TIBC
Iron: 70 ug/dL (ref 28–170)
Saturation Ratios: 18 % (ref 10.4–31.8)
TIBC: 381 ug/dL (ref 250–450)
UIBC: 311 ug/dL

## 2022-02-19 LAB — COMPREHENSIVE METABOLIC PANEL
ALT: 10 U/L (ref 0–44)
AST: 16 U/L (ref 15–41)
Albumin: 4.3 g/dL (ref 3.5–5.0)
Alkaline Phosphatase: 72 U/L (ref 38–126)
Anion gap: 8 (ref 5–15)
BUN: 25 mg/dL — ABNORMAL HIGH (ref 8–23)
CO2: 28 mmol/L (ref 22–32)
Calcium: 10.2 mg/dL (ref 8.9–10.3)
Chloride: 105 mmol/L (ref 98–111)
Creatinine, Ser: 1.11 mg/dL — ABNORMAL HIGH (ref 0.44–1.00)
GFR, Estimated: 49 mL/min — ABNORMAL LOW (ref >60)
Glucose, Bld: 100 mg/dL — ABNORMAL HIGH (ref 70–99)
Potassium: 4.9 mmol/L (ref 3.5–5.1)
Sodium: 141 mmol/L (ref 135–145)
Total Bilirubin: 0.9 mg/dL (ref 0.3–1.2)
Total Protein: 7.3 g/dL (ref 6.5–8.1)

## 2022-02-19 LAB — FERRITIN: Ferritin: 19 ng/mL (ref 11–307)

## 2022-02-19 NOTE — Progress Notes (Signed)
Hematology/Oncology Consult note [facility]  [phone] [phone]  Patient Care Team: Derinda Late, MD as PCP - General (Family Medicine)   Name of the patient: Tina Ingram  [id number]  1934/03/05   Date of visit: 02/19/22  Diagnosis- JAK2 positive essential thrombocythemia Iron deficiency anemia  Chief complaint/ Reason for visit-routine follow-up of essential thrombocytosis and iron deficiency anemia  Heme/Onc history: Patient is a 88 year old female with a past medical history significant for hypertension, osteoporosis, history of falls and C. difficile infection referred for thrombocytosis.  She was in the hospital in December 2022 for infectious colitis and was found to have platelet counts between 615 114 1152.  Looking back at her prior CBCs patient had a normal platelet count up until June 2022 although she has had a transient platelet count of 1240 in December 2020.  Over the last 6 months her platelet counts have been waxing and waning between [phone].  Patient is a resident of [facility].  Thrombocytosis persisted despite correction of iron deficiency.  JAK2 mutation positive.  BCR ABL FISH testing Did not show any BCR abl gene rearrangement.  ESR normal.   Patient was started on Hydrea in April 2023.  Presently on 1000 mg 4 times a week and 500 mg 3 times a week  Patient also has a history of iron deficiency anemia requiring IV iron intermittently  Interval history-she is doing well at [facility].  Appetite has remained stable.  No recent hospitalizations  ECOG PS- 2 Pain scale- 0   Review of systems- Review of Systems  Constitutional:  Positive for malaise/fatigue. Negative for chills, fever and weight loss.  HENT:  Negative for congestion, ear discharge and nosebleeds.   Eyes:  Negative for blurred vision.  Respiratory:  Negative for cough, hemoptysis, sputum production, shortness of  breath and wheezing.   Cardiovascular:  Negative for chest pain, palpitations, orthopnea and claudication.  Gastrointestinal:  Negative for abdominal pain, blood in stool, constipation, diarrhea, heartburn, melena, nausea and vomiting.  Genitourinary:  Negative for dysuria, flank pain, frequency, hematuria and urgency.  Musculoskeletal:  Negative for back pain, joint pain and myalgias.  Skin:  Negative for rash.  Neurological:  Negative for dizziness, tingling, focal weakness, seizures, weakness and headaches.  Endo/Heme/Allergies:  Does not bruise/bleed easily.  Psychiatric/Behavioral:  Negative for depression and suicidal ideas. The patient does not have insomnia.       No Known Allergies   Past Medical History:  Diagnosis Date   Basal cell carcinoma 06/21/2020   left posterior shoulder. 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 [facility] 07/23/2021     Past Surgical History:  Procedure Laterality Date   APPENDECTOMY     BREAST BIOPSY Left 05/16/2014   complex sclerosing lesion    BREAST EXCISIONAL BIOPSY Left 2016   surgical exc to remove complex sclerosing lesion   BREAST LUMPECTOMY WITH RADIOACTIVE SEED LOCALIZATION Left 06/20/2014   Procedure: BREAST LUMPECTOMY WITH RADIOACTIVE SEED LOCALIZATION;  Surgeon: Erroll Luna, MD;  Location: [facility];  Service: General;  Laterality: Left;   CAROTID ENDARTERECTOMY Right    CATARACT EXTRACTION W/PHACO Right 06/25/2015   Procedure: CATARACT EXTRACTION PHACO AND INTRAOCULAR LENS PLACEMENT ([place]);  Surgeon: Estill Cotta, MD;  Location: [facility];   Service: Ophthalmology;  Laterality: Right;  [place] 2.01 AP% 24.5 CDE 52.16 Fluid pack lot # [id number]   COLONOSCOPY     COLONOSCOPY WITH PROPOFOL N/A 07/07/2020   Procedure: COLONOSCOPY WITH PROPOFOL;  Surgeon: Lesly Rubenstein, MD;  Location: [facility];  Service: Endoscopy;  Laterality: N/A;   ESOPHAGOGASTRODUODENOSCOPY N/A 07/10/2020   Procedure: ESOPHAGOGASTRODUODENOSCOPY (EGD);  Surgeon: Jonathon Bellows, MD;  Location: [facility] ENDOSCOPY;  Service: Gastroenterology;  Laterality: N/A;   ESOPHAGOGASTRODUODENOSCOPY (EGD) WITH PROPOFOL N/A 07/06/2020   Procedure: ESOPHAGOGASTRODUODENOSCOPY (EGD) WITH PROPOFOL;  Surgeon: [name], MD;  Location: [facility] ENDOSCOPY;  Service: Endoscopy;  Laterality: N/A;   ESOPHAGOGASTRODUODENOSCOPY (EGD) WITH PROPOFOL N/A 03/28/2021   Procedure: ESOPHAGOGASTRODUODENOSCOPY (EGD) WITH PROPOFOL;  Surgeon: Toledo, Benay Pike, MD;  Location: [facility] ENDOSCOPY;  Service: Gastroenterology;  Laterality: N/A;   EYE SURGERY     KYPHOPLASTY N/A 01/27/2017   Procedure: [id number];  Surgeon: Hessie Knows, MD;  Location: [facility];  Service: Orthopedics;  Laterality: N/A;  T-9     Social History   Socioeconomic History   Marital status: Divorced    Spouse name: Not on file   Number of children: Not on file   Years of education: Not on file   Highest education level: Not on file  Occupational History   Not on file  Tobacco Use   Smoking status: Every Day    Packs/day: 1.00    Types: Cigarettes   Smokeless tobacco: Never  Vaping Use   Vaping Use: Never used  Substance and Sexual Activity   Alcohol use: No   Drug use: Never   Sexual activity: Never  Other Topics Concern   Not on file  Social History Narrative   Not on file   Social Determinants of Health   Financial Resource Strain: Not on file  Food Insecurity: Not on file  Transportation Needs: Not on file  Physical Activity: Not on file  Stress: Not on file  Social Connections: Not on file  Intimate  Partner Violence: Not on file    Family History  Problem Relation Age of Onset   Breast cancer Neg Hx      Current Outpatient Medications:    acetaminophen (TYLENOL) 325 MG tablet, Take 2 tablets (650 mg total) by mouth every 6 (six) hours as needed for mild pain (or Fever >/= 101)., Disp: , Rfl:    amLODipine (NORVASC) 2.5 MG tablet, Take 2.5 mg by mouth daily., Disp: , Rfl:    aspirin EC 81 MG tablet, Take 81 mg by mouth daily. Swallow whole., Disp: , Rfl:    busPIRone (BUSPAR) 7.5 MG tablet, Take 7.5 mg by mouth 2 (two) times daily., Disp: , Rfl:    cetirizine (ZYRTEC) 10 MG tablet, Take 10 mg by mouth daily., Disp: , Rfl:    Cholecalciferol (VITAMIN D3) 50 MCG (2000 UT) TABS, Take 1 capsule by mouth daily., Disp: , Rfl:    hydroxyurea (HYDREA) 500 MG capsule, Take 1 capsule (500 mg total) by mouth as directed. 1 tablet tues, wed, fri. Sat.& Sun. ,  On Mon. &Thurs. Please take 2 tablets May take with food to minimize GI side effects., Disp: 38 capsule, Rfl: 2   iron polysaccharides (NIFEREX) 150 MG capsule, Take 150 mg by mouth daily., Disp: ,  Rfl:    melatonin 3 MG TABS tablet, Take 3 mg by mouth at bedtime., Disp: , Rfl:    Multiple Vitamin (MULTIVITAMIN) tablet, Take 1 tablet by mouth daily., Disp: , Rfl:    MYRBETRIQ 50 MG TB24 tablet, Take 50 mg by mouth daily., Disp: , Rfl:    ondansetron (ZOFRAN-ODT) 4 MG disintegrating tablet, Take 1 tablet (4 mg total) by mouth every 8 (eight) hours as needed for nausea or vomiting., Disp: 20 tablet, Rfl: 0   pantoprazole (PROTONIX) 40 MG tablet, Take 1 tablet (40 mg total) by mouth 2 (two) times daily., Disp: 60 tablet, Rfl: 2   senna-docusate (SENOKOT-S) 8.6-50 MG tablet, Take 1 tablet by mouth at bedtime as needed for mild constipation., Disp: , Rfl:    traZODone (DESYREL) 50 MG tablet, Take 50 mg by mouth at bedtime., Disp: , Rfl:    diclofenac Sodium (VOLTAREN) 1 % GEL, Apply 1 application topically 4 (four) times daily as needed (pain).  (Patient not taking: Reported on 04/19/2021), Disp: , Rfl:    hydrochlorothiazide (MICROZIDE) 12.5 MG capsule, Take 12.5 mg by mouth daily. (Patient not taking: Reported on 02/19/2022), Disp: , Rfl:   Physical exam:  Vitals:   02/19/22 1114  BP: 127/74  Pulse: 80  Resp: 17  Temp: (!) 96 F (35.6 C)  SpO2: 96%  Weight: 109 lb (49.4 kg)   Physical Exam Constitutional:      General: She is not in acute distress.    Comments: Ambulates with a walker  Cardiovascular:     Rate and Rhythm: Normal rate and regular rhythm.     Heart sounds: Normal heart sounds.  Pulmonary:     Effort: Pulmonary effort is normal.     Breath sounds: Normal breath sounds.  Abdominal:     General: Bowel sounds are normal.     Palpations: Abdomen is soft.  Skin:    General: Skin is warm and dry.  Neurological:     Mental Status: She is alert and oriented to person, place, and time.         Latest Ref Rng & Units 02/19/2022   10:53 AM  CMP  Glucose 70 - 99 mg/dL 100   BUN 8 - 23 mg/dL 25   Creatinine 0.44 - 1.00 mg/dL 1.11   Sodium 135 - 145 mmol/L 141   Potassium 3.5 - 5.1 mmol/L 4.9   Chloride 98 - 111 mmol/L 105   CO2 22 - 32 mmol/L 28   Calcium 8.9 - 10.3 mg/dL 10.2   Total Protein 6.5 - 8.1 g/dL 7.3   Total Bilirubin 0.3 - 1.2 mg/dL 0.9   Alkaline Phos 38 - 126 U/L 72   AST 15 - 41 U/L 16   ALT 0 - 44 U/L 10       Latest Ref Rng & Units 02/19/2022   10:53 AM  CBC  WBC 4.0 - 10.5 K/uL 4.1   Hemoglobin 12.0 - 15.0 g/dL 11.6   Hematocrit 36.0 - 46.0 % 35.5   Platelets 150 - 400 K/uL 341      Assessment and plan- Patient is a 88 y.o. female who is here for follow-up of following issues  High risk essential thrombocytosis: Presently on Hydrea and tolerating it well.  Macrocytosis secondary to Hydrea.  Platelets are at goal less than 400  Iron deficiency anemia: Hemoglobin is stable around 11.5 however ferritin levels haveDropped to 19 from a prior value of 55.  Iron studies are normal.  We  will reach out to her to see if she is interested in getting IV iron.  Otherwise I will repeat CBC ferritin and iron studies in 3 months in 6 months and see her back in 6 months   Visit Diagnosis 1. Iron deficiency anemia, unspecified iron deficiency anemia type   2. Essential thrombocytosis (HCC)   3. High risk medication use      Dr. Randa Evens, MD, MPH [facility] at [place] [phone] 02/19/2022 1:27 PM

## 2022-02-19 NOTE — Progress Notes (Signed)
Patient here for oncology follow-up appointment, expresses no new concerns at this time.

## 2022-02-19 NOTE — Telephone Encounter (Signed)
I called over to let the staff know at the assisted living that her ferritin and iron was low and Dr. Janese Banks recommends patient to get IV iron.  Checking on which 1 that she could have and wanted to make sure that the patient would like to have the IV iron.  I gave them my direct number and they are going to talk to the patient as well as how they can get her over here and let us know.

## 2022-02-28 ENCOUNTER — Inpatient Hospital Stay: Payer: PPO

## 2022-02-28 VITALS — BP 154/84 | HR 86 | Temp 96.9°F | Resp 18

## 2022-02-28 DIAGNOSIS — D500 Iron deficiency anemia secondary to blood loss (chronic): Secondary | ICD-10-CM

## 2022-02-28 DIAGNOSIS — D509 Iron deficiency anemia, unspecified: Secondary | ICD-10-CM | POA: Diagnosis not present

## 2022-02-28 MED ORDER — SODIUM CHLORIDE 0.9 % IV SOLN
510.0000 mg | INTRAVENOUS | Status: DC
  Administered 2022-02-28: 510 mg via INTRAVENOUS
  Filled 2022-02-28: qty 510

## 2022-02-28 MED ORDER — SODIUM CHLORIDE 0.9 % IV SOLN
Freq: Once | INTRAVENOUS | Status: AC
  Filled 2022-02-28: qty 250

## 2022-03-07 ENCOUNTER — Inpatient Hospital Stay: Payer: PPO

## 2022-03-07 VITALS — BP 104/51 | HR 80 | Temp 97.2°F | Resp 16

## 2022-03-07 DIAGNOSIS — D500 Iron deficiency anemia secondary to blood loss (chronic): Secondary | ICD-10-CM

## 2022-03-07 DIAGNOSIS — D509 Iron deficiency anemia, unspecified: Secondary | ICD-10-CM | POA: Diagnosis not present

## 2022-03-07 MED ORDER — SODIUM CHLORIDE 0.9 % IV SOLN
Freq: Once | INTRAVENOUS | Status: AC
  Filled 2022-03-07: qty 250

## 2022-03-07 MED ORDER — SODIUM CHLORIDE 0.9 % IV SOLN
510.0000 mg | INTRAVENOUS | Status: DC
  Administered 2022-03-07: 510 mg via INTRAVENOUS
  Filled 2022-03-07: qty 510

## 2022-03-12 DIAGNOSIS — D508 Other iron deficiency anemias: Secondary | ICD-10-CM | POA: Diagnosis not present

## 2022-03-12 DIAGNOSIS — R3915 Urgency of urination: Secondary | ICD-10-CM | POA: Diagnosis not present

## 2022-03-12 DIAGNOSIS — M810 Age-related osteoporosis without current pathological fracture: Secondary | ICD-10-CM | POA: Diagnosis not present

## 2022-03-12 DIAGNOSIS — E559 Vitamin D deficiency, unspecified: Secondary | ICD-10-CM | POA: Diagnosis not present

## 2022-03-12 DIAGNOSIS — K5900 Constipation, unspecified: Secondary | ICD-10-CM | POA: Diagnosis not present

## 2022-03-12 DIAGNOSIS — D473 Essential (hemorrhagic) thrombocythemia: Secondary | ICD-10-CM | POA: Diagnosis not present

## 2022-03-12 DIAGNOSIS — K219 Gastro-esophageal reflux disease without esophagitis: Secondary | ICD-10-CM | POA: Diagnosis not present

## 2022-03-12 DIAGNOSIS — I10 Essential (primary) hypertension: Secondary | ICD-10-CM | POA: Diagnosis not present

## 2022-03-17 DIAGNOSIS — D508 Other iron deficiency anemias: Secondary | ICD-10-CM | POA: Diagnosis not present

## 2022-03-17 DIAGNOSIS — M810 Age-related osteoporosis without current pathological fracture: Secondary | ICD-10-CM | POA: Diagnosis not present

## 2022-03-17 DIAGNOSIS — I129 Hypertensive chronic kidney disease with stage 1 through stage 4 chronic kidney disease, or unspecified chronic kidney disease: Secondary | ICD-10-CM | POA: Diagnosis not present

## 2022-03-17 DIAGNOSIS — E559 Vitamin D deficiency, unspecified: Secondary | ICD-10-CM | POA: Diagnosis not present

## 2022-03-17 DIAGNOSIS — I10 Essential (primary) hypertension: Secondary | ICD-10-CM | POA: Diagnosis not present

## 2022-03-17 DIAGNOSIS — K219 Gastro-esophageal reflux disease without esophagitis: Secondary | ICD-10-CM | POA: Diagnosis not present

## 2022-03-17 DIAGNOSIS — R300 Dysuria: Secondary | ICD-10-CM | POA: Diagnosis not present

## 2022-03-25 DIAGNOSIS — E559 Vitamin D deficiency, unspecified: Secondary | ICD-10-CM | POA: Diagnosis not present

## 2022-03-25 DIAGNOSIS — K219 Gastro-esophageal reflux disease without esophagitis: Secondary | ICD-10-CM | POA: Diagnosis not present

## 2022-03-25 DIAGNOSIS — D508 Other iron deficiency anemias: Secondary | ICD-10-CM | POA: Diagnosis not present

## 2022-03-25 DIAGNOSIS — I10 Essential (primary) hypertension: Secondary | ICD-10-CM | POA: Diagnosis not present

## 2022-03-25 DIAGNOSIS — R3915 Urgency of urination: Secondary | ICD-10-CM | POA: Diagnosis not present

## 2022-04-09 DIAGNOSIS — F17210 Nicotine dependence, cigarettes, uncomplicated: Secondary | ICD-10-CM | POA: Diagnosis not present

## 2022-04-09 DIAGNOSIS — E559 Vitamin D deficiency, unspecified: Secondary | ICD-10-CM | POA: Diagnosis not present

## 2022-04-09 DIAGNOSIS — I10 Essential (primary) hypertension: Secondary | ICD-10-CM | POA: Diagnosis not present

## 2022-04-09 DIAGNOSIS — K5900 Constipation, unspecified: Secondary | ICD-10-CM | POA: Diagnosis not present

## 2022-04-09 DIAGNOSIS — D508 Other iron deficiency anemias: Secondary | ICD-10-CM | POA: Diagnosis not present

## 2022-04-09 DIAGNOSIS — N3281 Overactive bladder: Secondary | ICD-10-CM | POA: Diagnosis not present

## 2022-04-09 DIAGNOSIS — K219 Gastro-esophageal reflux disease without esophagitis: Secondary | ICD-10-CM | POA: Diagnosis not present

## 2022-04-11 DIAGNOSIS — R41841 Cognitive communication deficit: Secondary | ICD-10-CM | POA: Diagnosis not present

## 2022-04-11 DIAGNOSIS — K5900 Constipation, unspecified: Secondary | ICD-10-CM | POA: Diagnosis not present

## 2022-04-11 DIAGNOSIS — R262 Difficulty in walking, not elsewhere classified: Secondary | ICD-10-CM | POA: Diagnosis not present

## 2022-04-11 DIAGNOSIS — S72001A Fracture of unspecified part of neck of right femur, initial encounter for closed fracture: Secondary | ICD-10-CM | POA: Diagnosis not present

## 2022-04-11 DIAGNOSIS — F17210 Nicotine dependence, cigarettes, uncomplicated: Secondary | ICD-10-CM | POA: Diagnosis not present

## 2022-04-11 DIAGNOSIS — D473 Essential (hemorrhagic) thrombocythemia: Secondary | ICD-10-CM | POA: Diagnosis not present

## 2022-04-11 DIAGNOSIS — R457 State of emotional shock and stress, unspecified: Secondary | ICD-10-CM | POA: Diagnosis not present

## 2022-04-11 DIAGNOSIS — N179 Acute kidney failure, unspecified: Secondary | ICD-10-CM | POA: Diagnosis not present

## 2022-04-11 DIAGNOSIS — F39 Unspecified mood [affective] disorder: Secondary | ICD-10-CM | POA: Diagnosis not present

## 2022-04-11 DIAGNOSIS — D509 Iron deficiency anemia, unspecified: Secondary | ICD-10-CM | POA: Diagnosis not present

## 2022-04-11 DIAGNOSIS — R0902 Hypoxemia: Secondary | ICD-10-CM | POA: Diagnosis not present

## 2022-04-11 DIAGNOSIS — I10 Essential (primary) hypertension: Secondary | ICD-10-CM | POA: Diagnosis not present

## 2022-04-11 DIAGNOSIS — D75838 Other thrombocytosis: Secondary | ICD-10-CM | POA: Diagnosis not present

## 2022-04-11 DIAGNOSIS — G8918 Other acute postprocedural pain: Secondary | ICD-10-CM | POA: Diagnosis not present

## 2022-04-11 DIAGNOSIS — N3289 Other specified disorders of bladder: Secondary | ICD-10-CM | POA: Diagnosis not present

## 2022-04-11 DIAGNOSIS — W19XXXA Unspecified fall, initial encounter: Secondary | ICD-10-CM | POA: Diagnosis not present

## 2022-04-11 DIAGNOSIS — M6281 Muscle weakness (generalized): Secondary | ICD-10-CM | POA: Diagnosis not present

## 2022-04-11 DIAGNOSIS — G9341 Metabolic encephalopathy: Secondary | ICD-10-CM | POA: Diagnosis not present

## 2022-04-11 DIAGNOSIS — M84459D Pathological fracture, hip, unspecified, subsequent encounter for fracture with routine healing: Secondary | ICD-10-CM | POA: Diagnosis not present

## 2022-04-11 DIAGNOSIS — S72031A Displaced midcervical fracture of right femur, initial encounter for closed fracture: Secondary | ICD-10-CM | POA: Diagnosis not present

## 2022-04-11 DIAGNOSIS — K219 Gastro-esophageal reflux disease without esophagitis: Secondary | ICD-10-CM | POA: Diagnosis not present

## 2022-04-11 DIAGNOSIS — R9431 Abnormal electrocardiogram [ECG] [EKG]: Secondary | ICD-10-CM | POA: Diagnosis not present

## 2022-04-11 DIAGNOSIS — R1311 Dysphagia, oral phase: Secondary | ICD-10-CM | POA: Diagnosis not present

## 2022-04-11 DIAGNOSIS — R269 Unspecified abnormalities of gait and mobility: Secondary | ICD-10-CM | POA: Diagnosis not present

## 2022-04-11 DIAGNOSIS — Z471 Aftercare following joint replacement surgery: Secondary | ICD-10-CM | POA: Diagnosis not present

## 2022-04-11 DIAGNOSIS — S79911A Unspecified injury of right hip, initial encounter: Secondary | ICD-10-CM | POA: Diagnosis not present

## 2022-04-11 DIAGNOSIS — R0689 Other abnormalities of breathing: Secondary | ICD-10-CM | POA: Diagnosis not present

## 2022-04-11 DIAGNOSIS — N3281 Overactive bladder: Secondary | ICD-10-CM | POA: Diagnosis not present

## 2022-04-11 DIAGNOSIS — G4700 Insomnia, unspecified: Secondary | ICD-10-CM | POA: Diagnosis not present

## 2022-04-11 DIAGNOSIS — Z7982 Long term (current) use of aspirin: Secondary | ICD-10-CM | POA: Diagnosis not present

## 2022-04-11 DIAGNOSIS — M25551 Pain in right hip: Secondary | ICD-10-CM | POA: Diagnosis not present

## 2022-04-11 DIAGNOSIS — D649 Anemia, unspecified: Secondary | ICD-10-CM | POA: Diagnosis not present

## 2022-04-11 DIAGNOSIS — F329 Major depressive disorder, single episode, unspecified: Secondary | ICD-10-CM | POA: Diagnosis not present

## 2022-04-11 DIAGNOSIS — R110 Nausea: Secondary | ICD-10-CM | POA: Diagnosis not present

## 2022-04-11 DIAGNOSIS — Z1152 Encounter for screening for COVID-19: Secondary | ICD-10-CM | POA: Diagnosis not present

## 2022-04-11 DIAGNOSIS — Z9181 History of falling: Secondary | ICD-10-CM | POA: Diagnosis not present

## 2022-04-11 DIAGNOSIS — M2550 Pain in unspecified joint: Secondary | ICD-10-CM | POA: Diagnosis not present

## 2022-04-11 DIAGNOSIS — Z96641 Presence of right artificial hip joint: Secondary | ICD-10-CM | POA: Diagnosis not present

## 2022-04-11 DIAGNOSIS — J309 Allergic rhinitis, unspecified: Secondary | ICD-10-CM | POA: Diagnosis not present

## 2022-04-11 DIAGNOSIS — Z20822 Contact with and (suspected) exposure to covid-19: Secondary | ICD-10-CM | POA: Diagnosis not present

## 2022-04-11 DIAGNOSIS — N182 Chronic kidney disease, stage 2 (mild): Secondary | ICD-10-CM | POA: Diagnosis not present

## 2022-04-11 DIAGNOSIS — Z66 Do not resuscitate: Secondary | ICD-10-CM | POA: Diagnosis not present

## 2022-04-11 DIAGNOSIS — I129 Hypertensive chronic kidney disease with stage 1 through stage 4 chronic kidney disease, or unspecified chronic kidney disease: Secondary | ICD-10-CM | POA: Diagnosis not present

## 2022-04-11 DIAGNOSIS — M810 Age-related osteoporosis without current pathological fracture: Secondary | ICD-10-CM | POA: Diagnosis not present

## 2022-04-18 DIAGNOSIS — Z471 Aftercare following joint replacement surgery: Secondary | ICD-10-CM | POA: Diagnosis not present

## 2022-04-18 DIAGNOSIS — R918 Other nonspecific abnormal finding of lung field: Secondary | ICD-10-CM | POA: Diagnosis not present

## 2022-04-18 DIAGNOSIS — R269 Unspecified abnormalities of gait and mobility: Secondary | ICD-10-CM | POA: Diagnosis not present

## 2022-04-18 DIAGNOSIS — M2550 Pain in unspecified joint: Secondary | ICD-10-CM | POA: Diagnosis not present

## 2022-04-18 DIAGNOSIS — S72001D Fracture of unspecified part of neck of right femur, subsequent encounter for closed fracture with routine healing: Secondary | ICD-10-CM | POA: Diagnosis not present

## 2022-04-18 DIAGNOSIS — R110 Nausea: Secondary | ICD-10-CM | POA: Diagnosis not present

## 2022-04-18 DIAGNOSIS — R0602 Shortness of breath: Secondary | ICD-10-CM | POA: Diagnosis not present

## 2022-04-18 DIAGNOSIS — I8290 Acute embolism and thrombosis of unspecified vein: Secondary | ICD-10-CM | POA: Diagnosis not present

## 2022-04-18 DIAGNOSIS — R1311 Dysphagia, oral phase: Secondary | ICD-10-CM | POA: Diagnosis not present

## 2022-04-18 DIAGNOSIS — D473 Essential (hemorrhagic) thrombocythemia: Secondary | ICD-10-CM | POA: Diagnosis not present

## 2022-04-18 DIAGNOSIS — N3281 Overactive bladder: Secondary | ICD-10-CM | POA: Diagnosis not present

## 2022-04-18 DIAGNOSIS — R0902 Hypoxemia: Secondary | ICD-10-CM | POA: Diagnosis not present

## 2022-04-18 DIAGNOSIS — D75838 Other thrombocytosis: Secondary | ICD-10-CM | POA: Diagnosis not present

## 2022-04-18 DIAGNOSIS — R41841 Cognitive communication deficit: Secondary | ICD-10-CM | POA: Diagnosis not present

## 2022-04-18 DIAGNOSIS — F329 Major depressive disorder, single episode, unspecified: Secondary | ICD-10-CM | POA: Diagnosis not present

## 2022-04-18 DIAGNOSIS — K219 Gastro-esophageal reflux disease without esophagitis: Secondary | ICD-10-CM | POA: Diagnosis not present

## 2022-04-18 DIAGNOSIS — K5900 Constipation, unspecified: Secondary | ICD-10-CM | POA: Diagnosis not present

## 2022-04-18 DIAGNOSIS — I10 Essential (primary) hypertension: Secondary | ICD-10-CM | POA: Diagnosis not present

## 2022-04-18 DIAGNOSIS — D649 Anemia, unspecified: Secondary | ICD-10-CM | POA: Diagnosis not present

## 2022-04-18 DIAGNOSIS — J309 Allergic rhinitis, unspecified: Secondary | ICD-10-CM | POA: Diagnosis not present

## 2022-04-18 DIAGNOSIS — G4700 Insomnia, unspecified: Secondary | ICD-10-CM | POA: Diagnosis not present

## 2022-04-18 DIAGNOSIS — M84459D Pathological fracture, hip, unspecified, subsequent encounter for fracture with routine healing: Secondary | ICD-10-CM | POA: Diagnosis not present

## 2022-04-18 DIAGNOSIS — R262 Difficulty in walking, not elsewhere classified: Secondary | ICD-10-CM | POA: Diagnosis not present

## 2022-04-18 DIAGNOSIS — Z9181 History of falling: Secondary | ICD-10-CM | POA: Diagnosis not present

## 2022-04-18 DIAGNOSIS — M6281 Muscle weakness (generalized): Secondary | ICD-10-CM | POA: Diagnosis not present

## 2022-04-18 DIAGNOSIS — Z96641 Presence of right artificial hip joint: Secondary | ICD-10-CM | POA: Diagnosis not present

## 2022-04-22 DIAGNOSIS — K5900 Constipation, unspecified: Secondary | ICD-10-CM | POA: Diagnosis not present

## 2022-04-22 DIAGNOSIS — I10 Essential (primary) hypertension: Secondary | ICD-10-CM | POA: Diagnosis not present

## 2022-04-22 DIAGNOSIS — I8290 Acute embolism and thrombosis of unspecified vein: Secondary | ICD-10-CM | POA: Diagnosis not present

## 2022-04-22 DIAGNOSIS — S72001D Fracture of unspecified part of neck of right femur, subsequent encounter for closed fracture with routine healing: Secondary | ICD-10-CM | POA: Diagnosis not present

## 2022-05-01 ENCOUNTER — Ambulatory Visit: Payer: PPO | Admitting: Dermatology

## 2022-05-02 DIAGNOSIS — R0902 Hypoxemia: Secondary | ICD-10-CM | POA: Diagnosis not present

## 2022-05-02 DIAGNOSIS — K219 Gastro-esophageal reflux disease without esophagitis: Secondary | ICD-10-CM | POA: Diagnosis not present

## 2022-05-02 DIAGNOSIS — S72001D Fracture of unspecified part of neck of right femur, subsequent encounter for closed fracture with routine healing: Secondary | ICD-10-CM | POA: Diagnosis not present

## 2022-05-02 DIAGNOSIS — I10 Essential (primary) hypertension: Secondary | ICD-10-CM | POA: Diagnosis not present

## 2022-05-06 DIAGNOSIS — R918 Other nonspecific abnormal finding of lung field: Secondary | ICD-10-CM | POA: Diagnosis not present

## 2022-05-06 DIAGNOSIS — S72001D Fracture of unspecified part of neck of right femur, subsequent encounter for closed fracture with routine healing: Secondary | ICD-10-CM | POA: Diagnosis not present

## 2022-05-14 DIAGNOSIS — R2681 Unsteadiness on feet: Secondary | ICD-10-CM | POA: Diagnosis not present

## 2022-05-14 DIAGNOSIS — S7291XD Unspecified fracture of right femur, subsequent encounter for closed fracture with routine healing: Secondary | ICD-10-CM | POA: Diagnosis not present

## 2022-05-14 DIAGNOSIS — R262 Difficulty in walking, not elsewhere classified: Secondary | ICD-10-CM | POA: Diagnosis not present

## 2022-05-14 DIAGNOSIS — R2689 Other abnormalities of gait and mobility: Secondary | ICD-10-CM | POA: Diagnosis not present

## 2022-05-14 DIAGNOSIS — Z96641 Presence of right artificial hip joint: Secondary | ICD-10-CM | POA: Diagnosis not present

## 2022-05-14 DIAGNOSIS — D508 Other iron deficiency anemias: Secondary | ICD-10-CM | POA: Diagnosis not present

## 2022-05-14 DIAGNOSIS — R269 Unspecified abnormalities of gait and mobility: Secondary | ICD-10-CM | POA: Diagnosis not present

## 2022-05-20 ENCOUNTER — Other Ambulatory Visit: Payer: PPO

## 2022-05-20 DIAGNOSIS — R2689 Other abnormalities of gait and mobility: Secondary | ICD-10-CM | POA: Diagnosis not present

## 2022-05-20 DIAGNOSIS — R262 Difficulty in walking, not elsewhere classified: Secondary | ICD-10-CM | POA: Diagnosis not present

## 2022-05-20 DIAGNOSIS — Z96641 Presence of right artificial hip joint: Secondary | ICD-10-CM | POA: Diagnosis not present

## 2022-05-20 DIAGNOSIS — R2681 Unsteadiness on feet: Secondary | ICD-10-CM | POA: Diagnosis not present

## 2022-05-21 ENCOUNTER — Other Ambulatory Visit: Payer: PPO

## 2022-05-21 DIAGNOSIS — R2689 Other abnormalities of gait and mobility: Secondary | ICD-10-CM | POA: Diagnosis not present

## 2022-05-21 DIAGNOSIS — R2681 Unsteadiness on feet: Secondary | ICD-10-CM | POA: Diagnosis not present

## 2022-05-21 DIAGNOSIS — R262 Difficulty in walking, not elsewhere classified: Secondary | ICD-10-CM | POA: Diagnosis not present

## 2022-05-21 DIAGNOSIS — Z96641 Presence of right artificial hip joint: Secondary | ICD-10-CM | POA: Diagnosis not present

## 2022-05-23 ENCOUNTER — Inpatient Hospital Stay: Payer: PPO | Attending: Nurse Practitioner

## 2022-05-23 DIAGNOSIS — Z1589 Genetic susceptibility to other disease: Secondary | ICD-10-CM

## 2022-05-23 DIAGNOSIS — R2689 Other abnormalities of gait and mobility: Secondary | ICD-10-CM | POA: Diagnosis not present

## 2022-05-23 DIAGNOSIS — D509 Iron deficiency anemia, unspecified: Secondary | ICD-10-CM | POA: Insufficient documentation

## 2022-05-23 DIAGNOSIS — R262 Difficulty in walking, not elsewhere classified: Secondary | ICD-10-CM | POA: Diagnosis not present

## 2022-05-23 DIAGNOSIS — Z96641 Presence of right artificial hip joint: Secondary | ICD-10-CM | POA: Diagnosis not present

## 2022-05-23 DIAGNOSIS — D500 Iron deficiency anemia secondary to blood loss (chronic): Secondary | ICD-10-CM

## 2022-05-23 DIAGNOSIS — R2681 Unsteadiness on feet: Secondary | ICD-10-CM | POA: Diagnosis not present

## 2022-05-23 LAB — CBC WITH DIFFERENTIAL/PLATELET
Abs Immature Granulocytes: 0.02 10*3/uL (ref 0.00–0.07)
Basophils Absolute: 0 10*3/uL (ref 0.0–0.1)
Basophils Relative: 0 %
Eosinophils Absolute: 0.1 10*3/uL (ref 0.0–0.5)
Eosinophils Relative: 2 %
HCT: 32.9 % — ABNORMAL LOW (ref 36.0–46.0)
Hemoglobin: 10.5 g/dL — ABNORMAL LOW (ref 12.0–15.0)
Immature Granulocytes: 0 %
Lymphocytes Relative: 15 %
Lymphs Abs: 0.8 10*3/uL (ref 0.7–4.0)
MCH: 37.1 pg — ABNORMAL HIGH (ref 26.0–34.0)
MCHC: 31.9 g/dL (ref 30.0–36.0)
MCV: 116.3 fL — ABNORMAL HIGH (ref 80.0–100.0)
Monocytes Absolute: 0.4 10*3/uL (ref 0.1–1.0)
Monocytes Relative: 7 %
Neutro Abs: 3.9 10*3/uL (ref 1.7–7.7)
Neutrophils Relative %: 76 %
Platelets: 537 10*3/uL — ABNORMAL HIGH (ref 150–400)
RBC: 2.83 MIL/uL — ABNORMAL LOW (ref 3.87–5.11)
RDW: 13.9 % (ref 11.5–15.5)
WBC: 5.1 10*3/uL (ref 4.0–10.5)
nRBC: 0 % (ref 0.0–0.2)

## 2022-05-23 LAB — IRON AND TIBC
Iron: 46 ug/dL (ref 28–170)
Saturation Ratios: 19 % (ref 10.4–31.8)
TIBC: 237 ug/dL — ABNORMAL LOW (ref 250–450)
UIBC: 191 ug/dL

## 2022-05-23 LAB — FERRITIN: Ferritin: 185 ng/mL (ref 11–307)

## 2022-05-26 DIAGNOSIS — R2681 Unsteadiness on feet: Secondary | ICD-10-CM | POA: Diagnosis not present

## 2022-05-26 DIAGNOSIS — R262 Difficulty in walking, not elsewhere classified: Secondary | ICD-10-CM | POA: Diagnosis not present

## 2022-05-26 DIAGNOSIS — H43822 Vitreomacular adhesion, left eye: Secondary | ICD-10-CM | POA: Diagnosis not present

## 2022-05-26 DIAGNOSIS — Z96641 Presence of right artificial hip joint: Secondary | ICD-10-CM | POA: Diagnosis not present

## 2022-05-26 DIAGNOSIS — R2689 Other abnormalities of gait and mobility: Secondary | ICD-10-CM | POA: Diagnosis not present

## 2022-05-28 ENCOUNTER — Other Ambulatory Visit: Payer: PPO

## 2022-05-28 DIAGNOSIS — R262 Difficulty in walking, not elsewhere classified: Secondary | ICD-10-CM | POA: Diagnosis not present

## 2022-05-28 DIAGNOSIS — R2689 Other abnormalities of gait and mobility: Secondary | ICD-10-CM | POA: Diagnosis not present

## 2022-05-28 DIAGNOSIS — Z96641 Presence of right artificial hip joint: Secondary | ICD-10-CM | POA: Diagnosis not present

## 2022-05-28 DIAGNOSIS — R2681 Unsteadiness on feet: Secondary | ICD-10-CM | POA: Diagnosis not present

## 2022-05-28 DIAGNOSIS — S72001D Fracture of unspecified part of neck of right femur, subsequent encounter for closed fracture with routine healing: Secondary | ICD-10-CM | POA: Diagnosis not present

## 2022-05-29 ENCOUNTER — Encounter: Payer: Self-pay | Admitting: Dermatology

## 2022-05-29 ENCOUNTER — Ambulatory Visit: Payer: PPO | Admitting: Dermatology

## 2022-05-29 VITALS — BP 102/78 | HR 64

## 2022-05-29 DIAGNOSIS — W908XXA Exposure to other nonionizing radiation, initial encounter: Secondary | ICD-10-CM | POA: Diagnosis not present

## 2022-05-29 DIAGNOSIS — Z5111 Encounter for antineoplastic chemotherapy: Secondary | ICD-10-CM | POA: Diagnosis not present

## 2022-05-29 DIAGNOSIS — X32XXXA Exposure to sunlight, initial encounter: Secondary | ICD-10-CM | POA: Diagnosis not present

## 2022-05-29 DIAGNOSIS — L578 Other skin changes due to chronic exposure to nonionizing radiation: Secondary | ICD-10-CM | POA: Diagnosis not present

## 2022-05-29 DIAGNOSIS — L570 Actinic keratosis: Secondary | ICD-10-CM | POA: Diagnosis not present

## 2022-05-29 MED ORDER — FLUOROURACIL 5 % EX CREA
TOPICAL_CREAM | Freq: Two times a day (BID) | CUTANEOUS | 0 refills | Status: DC
Start: 2022-05-29 — End: 2023-01-20

## 2022-05-29 NOTE — Patient Instructions (Addendum)
Start 5-fluorouracil cream Apply twice daily for 10 days on nose, 2-3 weeks on left cheek, 7-10 days on forehead.   Reviewed course of treatment and expected reaction.  Patient advised to expect inflammation and crusting and advised that erosions are possible.  Patient advised to be diligent with sun protection during and after treatment. Handout with details of how to apply medication and what to expect provided. Counseled to keep medication out of reach of children and pets.  Rx sent to [facility] in [place]. Patient advised to call the pharmacy to fill the prescription, and the pharmacy will mail it to them for free.     Your prescription was sent to [facility] in [place]. A representative from [employer] will contact you within 2 business hours to verify your address and insurance information to schedule a free delivery. If for any reason you do not receive a phone call from them, please reach out to them. Their phone number is [phone] and their hours are Monday-Friday 9:00 am-5:00 pm.     Recommend daily broad spectrum sunscreen SPF 30+ to sun-exposed areas, reapply every 2 hours as needed. Call for new or changing lesions.  Staying in the shade or wearing long sleeves, sun glasses (UVA+UVB protection) and wide brim hats (4-inch brim around the entire circumference of the hat) are also recommended for sun protection.    Due to recent changes in healthcare laws, you may see results of your pathology and/or laboratory studies on MyChart before the doctors have had a chance to review them. We understand that in some cases there may be results that are confusing or concerning to you. Please understand that not all results are received at the same time and often the doctors may need to interpret multiple results in order to provide you with the best plan of care or course of treatment. Therefore, we ask that you please give [place] 2 business days to thoroughly review all your results  before contacting the office for clarification. Should we see a critical lab result, you will be contacted sooner.   If You Need Anything After Your Visit  If you have any questions or concerns for your doctor, please call our main line at [phone] and press option 4 to reach your doctor's medical assistant. If no one answers, please leave a voicemail as directed and we will return your call as soon as possible. Messages left after 4 pm will be answered the following business day.   You may also send [place] a message via MyChart. We typically respond to MyChart messages within 1-2 business days.  For prescription refills, please ask your pharmacy to contact our office. Our fax number is [phone].  If you have an urgent issue when the clinic is closed that cannot wait until the next business day, you can page your doctor at the number below.    Please note that while we do our best to be available for urgent issues outside of office hours, we are not available 24/7.   If you have an urgent issue and are unable to reach [place], you may choose to seek medical care at your doctor's office, retail clinic, urgent care center, or emergency room.  If you have a medical emergency, please immediately call 911 or go to the emergency department.  Pager Numbers  - Dr. Gwen Pounds: [phone]  - Dr. Neale Burly: [phone]  - Dr. Roseanne Reno: [phone]  In the event of inclement weather, please call our main line at [phone]  for an update on the status of any delays or closures.  Dermatology Medication Tips: Please keep the boxes that topical medications come in in order to help keep track of the instructions about where and how to use these. Pharmacies typically print the medication instructions only on the boxes and not directly on the medication tubes.   If your medication is too expensive, please contact our office at [phone] option 4 or send [place] a message through MyChart.   We are unable to  tell what your co-pay for medications will be in advance as this is different depending on your insurance coverage. However, we may be able to find a substitute medication at lower cost or fill out paperwork to get insurance to cover a needed medication.   If a prior authorization is required to get your medication covered by your insurance company, please allow [place] 1-2 business days to complete this process.  Drug prices often vary depending on where the prescription is filled and some pharmacies may offer cheaper prices.  The website www.goodrx.com contains coupons for medications through different pharmacies. The prices here do not account for what the cost may be with help from insurance (it may be cheaper with your insurance), but the website can give you the price if you did not use any insurance.  - You can print the associated coupon and take it with your prescription to the pharmacy.  - You may also stop by our office during regular business hours and pick up a GoodRx coupon card.  - If you need your prescription sent electronically to a different pharmacy, notify our office through [facility] or by phone at [phone] option 4.     Si Usted Necesita Algo Despus de Su Visita  Tambin puede enviarnos un mensaje a travs de Clinical cytogeneticist. Por lo general respondemos a los mensajes de MyChart en el transcurso de 1 a 2 das hbiles.  Para renovar recetas, por favor pida a su farmacia que se ponga en contacto con nuestra oficina. Annie Sable de fax es [place] [phone].  Si tiene un asunto urgente cuando la clnica est cerrada y que no puede esperar hasta el siguiente da hbil, puede llamar/localizar a su doctor(a) al nmero que aparece a continuacin.   Por favor, tenga en cuenta que aunque hacemos todo lo posible para estar disponibles para asuntos urgentes fuera del horario de [place], no estamos disponibles las 24 horas del da, los 7 [address]  [address] de la [place].   Si tiene un problema  urgente y no puede comunicarse con nosotros, puede optar por buscar atencin mdica  en el consultorio de su doctor(a), en una clnica privada, en un centro de atencin urgente o en una sala de emergencias.  Si tiene Engineer, drilling, por favor llame inmediatamente al 911 o vaya a la sala de emergencias.  Nmeros de bper  - Dr. Gwen Pounds: [phone]  - Dra. Moye: [phone]  - Dra. Roseanne Reno: [phone]  En caso de inclemencias del [place], por favor llame a Lacy Duverney principal al [phone] para una actualizacin sobre el [place] de cualquier retraso o cierre.  Consejos para la medicacin en dermatologa: Por favor, guarde las cajas en las que vienen los medicamentos de uso tpico para ayudarle a seguir las instrucciones sobre dnde y cmo usarlos. Las farmacias generalmente imprimen las instrucciones del medicamento slo en las cajas y no directamente en los tubos del [place].   Si su medicamento es [employer], por favor, pngase en contacto con Ferne Coe  oficina llamando al [phone] y presione la opcin 4 o envenos un mensaje a travs de Clinical cytogeneticist.   No podemos decirle cul ser su copago por los medicamentos por adelantado ya que esto es diferente dependiendo de la cobertura de su seguro. Sin embargo, es posible que podamos encontrar un medicamento sustituto a Audiological scientist un formulario para que el seguro cubra el medicamento que se considera necesario.   Si se requiere una autorizacin previa para que su compaa de seguros [place] su medicamento, por favor permtanos de 1 a 2 das hbiles para completar [address]  Los precios de los medicamentos varan con frecuencia dependiendo del Environmental consultant de dnde se surte la receta y alguna farmacias pueden ofrecer precios ms baratos.  El sitio web www.goodrx.com tiene cupones para medicamentos de Health and safety inspector. Los precios aqu no tienen en cuenta lo que podra costar con la ayuda del seguro (puede ser ms barato con  su seguro), pero el sitio web puede darle el precio si no utiliz Tourist information centre manager.  - Puede imprimir el cupn correspondiente y llevarlo con su receta a la farmacia.  - Tambin puede pasar por nuestra oficina durante el horario de atencin regular y Education officer, museum una tarjeta de cupones de GoodRx.  - Si necesita que su receta se enve electrnicamente a una farmacia diferente, informe a nuestra oficina a travs de MyChart de [place] o por telfono llamando al [phone] y presione la opcin 4.

## 2022-05-29 NOTE — Progress Notes (Signed)
   Follow-Up Visit   Subjective  Tina Ingram is a 88 y.o. female who presents for the following: 4 month AK recheck. Tx with LN2 at last visit. Upper mid chest, left medial cheek, right nasal dorsum, right nasal supratip.   The patient has spots, moles and lesions to be evaluated, some may be new or changing and the patient has concerns that these could be cancer.  The following portions of the chart were reviewed this encounter and updated as appropriate: medications, allergies, medical history  Review of Systems:  No other skin or systemic complaints except as noted in HPI or Assessment and Plan.  Objective  Well appearing patient in no apparent distress; mood and affect are within normal limits.  A focused examination was performed of the following areas: Face, chest Relevant physical exam findings are noted in the Assessment and Plan.   Assessment & Plan    ACTINIC DAMAGE WITH PRECANCEROUS ACTINIC KERATOSES Counseling for Topical Chemotherapy Management: Patient exhibits: - Severe, confluent actinic changes with pre-cancerous actinic keratoses that is secondary to cumulative UV radiation exposure over time - Condition that is severe; chronic, not at goal. - diffuse scaly erythematous macules and papules with underlying dyspigmentation - Discussed Prescription "Field Treatment" topical Chemotherapy for Severe, Chronic Confluent Actinic Changes with Pre-Cancerous Actinic Keratoses Field treatment involves treatment of an entire area of skin that has confluent Actinic Changes (Sun/ Ultraviolet light damage) and PreCancerous Actinic Keratoses by method of PhotoDynamic Therapy (PDT) and/or prescription Topical Chemotherapy agents such as 5-fluorouracil, 5-fluorouracil/calcipotriene, and/or imiquimod.  The purpose is to decrease the number of clinically evident and subclinical PreCancerous lesions to prevent progression to development of skin cancer by chemically destroying early  precancer changes that may or may not be visible.  It has been shown to reduce the risk of developing skin cancer in the treated area. As a result of treatment, redness, scaling, crusting, and open sores may occur during treatment course. One or more than one of these methods may be used and may have to be used several times to control, suppress and eliminate the PreCancerous changes. Discussed treatment course, expected reaction, and possible side effects. - Recommend daily broad spectrum sunscreen SPF 30+ to sun-exposed areas, reapply every 2 hours as needed.  - Staying in the shade or wearing long sleeves, sun glasses (UVA+UVB protection) and wide brim hats (4-inch brim around the entire circumference of the hat) are also recommended. - Call for new or changing lesions.  - Start 5-fluorouracil cream Apply twice daily for 10 days on nose, 2-3 weeks on left cheek, 7-10 days on forehead. Reviewed course of treatment and expected reaction.  Patient advised to expect inflammation and crusting and advised that erosions are possible.  Patient advised to be diligent with sun protection during and after treatment. Handout with details of how to apply medication and what to expect provided. Counseled to keep medication out of reach of children and pets.  Rx sent to [facility] in [place]. Patient advised to call the pharmacy to fill the prescription, and the pharmacy will mail it to them for free.    Consider biopsy if lesion on left cheek has not resolved.   Return for AK Follow Up as scheduled.  I, Lawson Radar, CMA, am acting as scribe for Darden Dates, MD.   Documentation: I have reviewed the above documentation for accuracy and completeness, and I agree with the above.  Darden Dates, MD

## 2022-05-30 DIAGNOSIS — R2689 Other abnormalities of gait and mobility: Secondary | ICD-10-CM | POA: Diagnosis not present

## 2022-05-30 DIAGNOSIS — R2681 Unsteadiness on feet: Secondary | ICD-10-CM | POA: Diagnosis not present

## 2022-05-30 DIAGNOSIS — R262 Difficulty in walking, not elsewhere classified: Secondary | ICD-10-CM | POA: Diagnosis not present

## 2022-05-30 DIAGNOSIS — Z96641 Presence of right artificial hip joint: Secondary | ICD-10-CM | POA: Diagnosis not present

## 2022-06-02 DIAGNOSIS — R2681 Unsteadiness on feet: Secondary | ICD-10-CM | POA: Diagnosis not present

## 2022-06-02 DIAGNOSIS — R262 Difficulty in walking, not elsewhere classified: Secondary | ICD-10-CM | POA: Diagnosis not present

## 2022-06-02 DIAGNOSIS — R2689 Other abnormalities of gait and mobility: Secondary | ICD-10-CM | POA: Diagnosis not present

## 2022-06-02 DIAGNOSIS — Z96641 Presence of right artificial hip joint: Secondary | ICD-10-CM | POA: Diagnosis not present

## 2022-06-04 DIAGNOSIS — R3915 Urgency of urination: Secondary | ICD-10-CM | POA: Diagnosis not present

## 2022-06-04 DIAGNOSIS — Z96641 Presence of right artificial hip joint: Secondary | ICD-10-CM | POA: Diagnosis not present

## 2022-06-04 DIAGNOSIS — I10 Essential (primary) hypertension: Secondary | ICD-10-CM | POA: Diagnosis not present

## 2022-06-04 DIAGNOSIS — D508 Other iron deficiency anemias: Secondary | ICD-10-CM | POA: Diagnosis not present

## 2022-06-04 DIAGNOSIS — E559 Vitamin D deficiency, unspecified: Secondary | ICD-10-CM | POA: Diagnosis not present

## 2022-06-04 DIAGNOSIS — R2681 Unsteadiness on feet: Secondary | ICD-10-CM | POA: Diagnosis not present

## 2022-06-04 DIAGNOSIS — R2689 Other abnormalities of gait and mobility: Secondary | ICD-10-CM | POA: Diagnosis not present

## 2022-06-04 DIAGNOSIS — R262 Difficulty in walking, not elsewhere classified: Secondary | ICD-10-CM | POA: Diagnosis not present

## 2022-06-04 DIAGNOSIS — K219 Gastro-esophageal reflux disease without esophagitis: Secondary | ICD-10-CM | POA: Diagnosis not present

## 2022-06-04 DIAGNOSIS — K5900 Constipation, unspecified: Secondary | ICD-10-CM | POA: Diagnosis not present

## 2022-06-04 DIAGNOSIS — F17210 Nicotine dependence, cigarettes, uncomplicated: Secondary | ICD-10-CM | POA: Diagnosis not present

## 2022-06-06 DIAGNOSIS — R262 Difficulty in walking, not elsewhere classified: Secondary | ICD-10-CM | POA: Diagnosis not present

## 2022-06-06 DIAGNOSIS — R2689 Other abnormalities of gait and mobility: Secondary | ICD-10-CM | POA: Diagnosis not present

## 2022-06-06 DIAGNOSIS — Z96641 Presence of right artificial hip joint: Secondary | ICD-10-CM | POA: Diagnosis not present

## 2022-06-06 DIAGNOSIS — R2681 Unsteadiness on feet: Secondary | ICD-10-CM | POA: Diagnosis not present

## 2022-06-09 DIAGNOSIS — Z96641 Presence of right artificial hip joint: Secondary | ICD-10-CM | POA: Diagnosis not present

## 2022-06-09 DIAGNOSIS — R262 Difficulty in walking, not elsewhere classified: Secondary | ICD-10-CM | POA: Diagnosis not present

## 2022-06-09 DIAGNOSIS — R2689 Other abnormalities of gait and mobility: Secondary | ICD-10-CM | POA: Diagnosis not present

## 2022-06-09 DIAGNOSIS — R2681 Unsteadiness on feet: Secondary | ICD-10-CM | POA: Diagnosis not present

## 2022-06-11 DIAGNOSIS — S7291XD Unspecified fracture of right femur, subsequent encounter for closed fracture with routine healing: Secondary | ICD-10-CM | POA: Diagnosis not present

## 2022-06-11 DIAGNOSIS — Z96641 Presence of right artificial hip joint: Secondary | ICD-10-CM | POA: Diagnosis not present

## 2022-06-11 DIAGNOSIS — R2681 Unsteadiness on feet: Secondary | ICD-10-CM | POA: Diagnosis not present

## 2022-06-11 DIAGNOSIS — R2689 Other abnormalities of gait and mobility: Secondary | ICD-10-CM | POA: Diagnosis not present

## 2022-06-11 DIAGNOSIS — F17210 Nicotine dependence, cigarettes, uncomplicated: Secondary | ICD-10-CM | POA: Diagnosis not present

## 2022-06-11 DIAGNOSIS — M2170 Unequal limb length (acquired), unspecified site: Secondary | ICD-10-CM | POA: Diagnosis not present

## 2022-06-11 DIAGNOSIS — R262 Difficulty in walking, not elsewhere classified: Secondary | ICD-10-CM | POA: Diagnosis not present

## 2022-06-13 DIAGNOSIS — Z96641 Presence of right artificial hip joint: Secondary | ICD-10-CM | POA: Diagnosis not present

## 2022-06-13 DIAGNOSIS — R2681 Unsteadiness on feet: Secondary | ICD-10-CM | POA: Diagnosis not present

## 2022-06-13 DIAGNOSIS — R2689 Other abnormalities of gait and mobility: Secondary | ICD-10-CM | POA: Diagnosis not present

## 2022-06-13 DIAGNOSIS — R262 Difficulty in walking, not elsewhere classified: Secondary | ICD-10-CM | POA: Diagnosis not present

## 2022-06-16 DIAGNOSIS — Z96641 Presence of right artificial hip joint: Secondary | ICD-10-CM | POA: Diagnosis not present

## 2022-06-16 DIAGNOSIS — R2681 Unsteadiness on feet: Secondary | ICD-10-CM | POA: Diagnosis not present

## 2022-06-16 DIAGNOSIS — R2689 Other abnormalities of gait and mobility: Secondary | ICD-10-CM | POA: Diagnosis not present

## 2022-06-16 DIAGNOSIS — R262 Difficulty in walking, not elsewhere classified: Secondary | ICD-10-CM | POA: Diagnosis not present

## 2022-06-17 ENCOUNTER — Ambulatory Visit: Payer: PPO | Admitting: Dermatology

## 2022-06-17 VITALS — BP 156/93 | HR 80

## 2022-06-17 DIAGNOSIS — L578 Other skin changes due to chronic exposure to nonionizing radiation: Secondary | ICD-10-CM

## 2022-06-17 DIAGNOSIS — Z5111 Encounter for antineoplastic chemotherapy: Secondary | ICD-10-CM

## 2022-06-17 DIAGNOSIS — X32XXXA Exposure to sunlight, initial encounter: Secondary | ICD-10-CM | POA: Diagnosis not present

## 2022-06-17 DIAGNOSIS — L570 Actinic keratosis: Secondary | ICD-10-CM

## 2022-06-17 DIAGNOSIS — L720 Epidermal cyst: Secondary | ICD-10-CM

## 2022-06-17 DIAGNOSIS — W908XXA Exposure to other nonionizing radiation, initial encounter: Secondary | ICD-10-CM | POA: Diagnosis not present

## 2022-06-17 NOTE — Patient Instructions (Addendum)
Start Fluorouracil 5% cream to the left upper chest twice daily x 2 weeks. Start Fluorouracil 5% creamtwice daily x 1 week on the nose.  5-Fluorouracil Patient Education   Actinic keratoses are the dry, red scaly spots on the skin caused by sun damage. A portion of these spots can turn into skin cancer with time, and treating them can help prevent development of skin cancer.   Treatment of these spots requires removal of the defective skin cells. There are various ways to remove actinic keratoses, including freezing with liquid nitrogen, treatment with creams, or treatment with a blue light procedure in the office.   5-fluorouracil cream is a topical cream used to treat actinic keratoses. It works by interfering with the growth of abnormal fast-growing skin cells, such as actinic keratoses. These cells peel off and are replaced by healthy ones. THIS CREAM SHOULD BE KEPT OUT OF REACH OF CHILDREN AND PETS AND SHOULD NOT BE USED BY PREGNANT WOMEN.  INSTRUCTIONS FOR 5-FLUOROURACIL CREAM:   5-fluorouracil cream typically needs to be used for 7-14 days depending on the location of treatment. A thin layer should be applied twice a day to the treatment areas recommended by your physician.    Avoid contact with your eyes or nostrils. Avoid applying the cream to your eyelids or lips unless directed to apply there by your physician. Do not use 5-fluorouracil on infected or open wounds.   You will develop redness, irritation and some crusting at areas where you have pre-cancer damage/actinic keratoses. IF YOU DEVELOP PAIN, BLEEDING, OR SIGNIFICANT CRUSTING, STOP THE TREATMENT EARLY - you have already gotten a good response and the actinic keratoses should clear up well.  Wash your hands after applying 5-fluorouracil 5% cream on your skin.   A moisturizer or sunscreen with a minimum SPF 30 should be applied each morning.   Once you have finished the treatment, you can apply a thin layer of Vaseline twice a  day to irritated areas to soothe and calm the areas more quickly. If you experience significant discomfort, contact your physician.  For some patients it is necessary to repeat the treatment for best results.  SIDE EFFECTS: When using 5-fluorouracil cream, you may have mild irritation, such as redness, dryness, swelling, or a mild burning sensation. This usually resolves within 2 weeks. The more actinic keratoses you have, the more redness and inflammation you can expect during treatment. Eye irritation has been reported rarely. If this occurs, please let us know.   If you have any trouble using this cream, please send [place] a MyChart Message or call the office. If you have any other questions about this information, please do not hesitate to ask me before you leave the office or reach out on MyChart or by phone.   Due to recent changes in healthcare laws, you may see results of your pathology and/or laboratory studies on MyChart before the doctors have had a chance to review them. We understand that in some cases there may be results that are confusing or concerning to you. Please understand that not all results are received at the same time and often the doctors may need to interpret multiple results in order to provide you with the best plan of care or course of treatment. Therefore, we ask that you please give [place] 2 business days to thoroughly review all your results before contacting the office for clarification. Should we see a critical lab result, you will be contacted sooner.   If You Need  Anything After Your Visit  If you have any questions or concerns for your doctor, please call our main line at [phone] and press option 4 to reach your doctor's medical assistant. If no one answers, please leave a voicemail as directed and we will return your call as soon as possible. Messages left after 4 pm will be answered the following business day.   You may also send [place] a message via MyChart. We  typically respond to MyChart messages within 1-2 business days.  For prescription refills, please ask your pharmacy to contact our office. Our fax number is [phone].  If you have an urgent issue when the clinic is closed that cannot wait until the next business day, you can page your doctor at the number below.    Please note that while we do our best to be available for urgent issues outside of office hours, we are not available 24/7.   If you have an urgent issue and are unable to reach [place], you may choose to seek medical care at your doctor's office, retail clinic, urgent care center, or emergency room.  If you have a medical emergency, please immediately call 911 or go to the emergency department.  Pager Numbers  - Dr. Gwen Pounds: [phone]  - Dr. Neale Burly: [phone]  - Dr. Roseanne Reno: [phone]  In the event of inclement weather, please call our main line at [phone] for an update on the status of any delays or closures.  Dermatology Medication Tips: Please keep the boxes that topical medications come in in order to help keep track of the instructions about where and how to use these. Pharmacies typically print the medication instructions only on the boxes and not directly on the medication tubes.   If your medication is too expensive, please contact our office at [phone] option 4 or send [place] a message through MyChart.   We are unable to tell what your co-pay for medications will be in advance as this is different depending on your insurance coverage. However, we may be able to find a substitute medication at lower cost or fill out paperwork to get insurance to cover a needed medication.   If a prior authorization is required to get your medication covered by your insurance company, please allow [place] 1-2 business days to complete this process.  Drug prices often vary depending on where the prescription is filled and some pharmacies may offer cheaper prices.  The  website www.goodrx.com contains coupons for medications through different pharmacies. The prices here do not account for what the cost may be with help from insurance (it may be cheaper with your insurance), but the website can give you the price if you did not use any insurance.  - You can print the associated coupon and take it with your prescription to the pharmacy.  - You may also stop by our office during regular business hours and pick up a GoodRx coupon card.  - If you need your prescription sent electronically to a different pharmacy, notify our office through [facility] or by phone at [phone] option 4.     Si Usted Necesita Algo Despus de Su Visita  Tambin puede enviarnos un mensaje a travs de Clinical cytogeneticist. Por lo general respondemos a los mensajes de MyChart en el transcurso de 1 a 2 das hbiles.  Para renovar recetas, por favor pida a su farmacia que se ponga en contacto con nuestra oficina. Annie Sable de fax es [place] [phone].  Si tiene un asunto  urgente cuando la clnica est cerrada y que no puede esperar hasta el siguiente da hbil, puede llamar/localizar a su doctor(a) al nmero que aparece a continuacin.   Por favor, tenga en cuenta que aunque hacemos todo lo posible para estar disponibles para asuntos urgentes fuera del horario de [place], no estamos disponibles las 24 horas del da, los 7 [address]  [address] de la [place].   Si tiene un problema urgente y no puede comunicarse con nosotros, puede optar por buscar atencin mdica  en el consultorio de su doctor(a), en una clnica privada, en un centro de atencin urgente o en una sala de emergencias.  Si tiene Engineer, drilling, por favor llame inmediatamente al 911 o vaya a la sala de emergencias.  Nmeros de bper  - Dr. Gwen Pounds: [phone]  - Dra. Moye: [phone]  - Dra. Roseanne Reno: [phone]  En caso de inclemencias del [place], por favor llame a Lacy Duverney principal al [phone] para una  actualizacin sobre el [place] de cualquier retraso o cierre.  Consejos para la medicacin en dermatologa: Por favor, guarde las cajas en las que vienen los medicamentos de uso tpico para ayudarle a seguir las instrucciones sobre dnde y cmo usarlos. Las farmacias generalmente imprimen las instrucciones del medicamento slo en las cajas y no directamente en los tubos del [place].   Si su medicamento es muy caro, por favor, pngase en contacto con Rolm Gala llamando al [phone] y presione la opcin 4 o envenos un mensaje a travs de Clinical cytogeneticist.   No podemos decirle cul ser su copago por los medicamentos por adelantado ya que esto es diferente dependiendo de la cobertura de su seguro. Sin embargo, es posible que podamos encontrar un medicamento sustituto a Audiological scientist un formulario para que el seguro cubra el medicamento que se considera necesario.   Si se requiere una autorizacin previa para que su compaa de seguros [place] su medicamento, por favor permtanos de 1 a 2 das hbiles para completar [address]  Los precios de los medicamentos varan con frecuencia dependiendo del Environmental consultant de dnde se surte la receta y alguna farmacias pueden ofrecer precios ms baratos.  El sitio web www.goodrx.com tiene cupones para medicamentos de Health and safety inspector. Los precios aqu no tienen en cuenta lo que podra costar con la ayuda del seguro (puede ser ms barato con su seguro), pero el sitio web puede darle el precio si no utiliz Tourist information centre manager.  - Puede imprimir el cupn correspondiente y llevarlo con su receta a la farmacia.  - Tambin puede pasar por nuestra oficina durante el horario de atencin regular y Education officer, museum una tarjeta de cupones de GoodRx.  - Si necesita que su receta se enve electrnicamente a una farmacia diferente, informe a nuestra oficina a travs de MyChart de [place] o por telfono llamando al [phone] y presione la opcin 4.

## 2022-06-17 NOTE — Progress Notes (Unsigned)
error 

## 2022-06-17 NOTE — Progress Notes (Signed)
Follow-Up Visit   Subjective  Tina Ingram is a 88 y.o. female who presents for the following: Ak follow up, patient currently using 5FU BID on the L med cheek and has had some crusting and bleeding. Other previously treated areas include the upper mid chest x 2, R nasal dorsum x 1, and R nasal supratip x 1.  The following portions of the chart were reviewed this encounter and updated as appropriate: medications, allergies, medical history  Review of Systems:  No other skin or systemic complaints except as noted in HPI or Assessment and Plan.  Objective  Well appearing patient in no apparent distress; mood and affect are within normal limits.   A focused examination was performed of the following areas: the face and chest   Relevant exam findings are noted in the Assessment and Plan.  R lat brow x 1 Erythematous thin papules/macules with gritty scale.     Assessment & Plan    Actinic skin damage  Related Medications fluorouracil (EFUDEX) 5 % cream Apply topically 2 (two) times daily. Apply BID for 10 days on nose, 2-3 weeks on left cheek, 7-10 days to affected areas on forehead  AK (actinic keratosis) R lat brow x 1  Hypertrophic   Actinic keratoses are precancerous spots that appear secondary to cumulative UV radiation exposure/sun exposure over time. They are chronic with expected duration over 1 year. A portion of actinic keratoses will progress to squamous cell carcinoma of the skin. It is not possible to reliably predict which spots will progress to skin cancer and so treatment is recommended to prevent development of skin cancer.  Recommend daily broad spectrum sunscreen SPF 30+ to sun-exposed areas, reapply every 2 hours as needed.  Recommend staying in the shade or wearing long sleeves, sun glasses (UVA+UVB protection) and wide brim hats (4-inch brim around the entire circumference of the hat). Call for new or changing lesions.  Prior to procedure, discussed  risks of blister formation, small wound, skin dyspigmentation, or rare scar following cryotherapy. Recommend Vaseline ointment to treated areas while healing.   Destruction of lesion - R lat brow x 1 Complexity: simple   Destruction method: cryotherapy   Informed consent: discussed and consent obtained   Timeout:  patient name, date of birth, surgical site, and procedure verified Lesion destroyed using liquid nitrogen: Yes   Region frozen until ice ball extended beyond lesion: Yes   Outcome: patient tolerated procedure well with no complications   Post-procedure details: wound care instructions given    Encounter for chemotherapy management   ACTINIC DAMAGE WITH PRECANCEROUS ACTINIC KERATOSES Counseling for Topical Chemotherapy Management: Patient exhibits: - Severe, confluent actinic changes with pre-cancerous actinic keratoses that is secondary to cumulative UV radiation exposure over time - Condition that is severe; chronic, not at goal. - diffuse scaly erythematous macules and papules with underlying dyspigmentation - Discussed Prescription "Field Treatment" topical Chemotherapy for Severe, Chronic Confluent Actinic Changes with Pre-Cancerous Actinic Keratoses Field treatment involves treatment of an entire area of skin that has confluent Actinic Changes (Sun/ Ultraviolet light damage) and PreCancerous Actinic Keratoses by method of PhotoDynamic Therapy (PDT) and/or prescription Topical Chemotherapy agents such as 5-fluorouracil, 5-fluorouracil/calcipotriene, and/or imiquimod.  The purpose is to decrease the number of clinically evident and subclinical PreCancerous lesions to prevent progression to development of skin cancer by chemically destroying early precancer changes that may or may not be visible.  It has been shown to reduce the risk of developing skin cancer in the  treated area. As a result of treatment, redness, scaling, crusting, and open sores may occur during treatment course.  One or more than one of these methods may be used and may have to be used several times to control, suppress and eliminate the PreCancerous changes. Discussed treatment course, expected reaction, and possible side effects. - Recommend daily broad spectrum sunscreen SPF 30+ to sun-exposed areas, reapply every 2 hours as needed.  - Staying in the shade or wearing long sleeves, sun glasses (UVA+UVB protection) and wide brim hats (4-inch brim around the entire circumference of the hat) are also recommended. - Call for new or changing lesions. - Start 5FU to the L upper chest BID x 2 weeks. Start 5FU BID x 1 week on the nose. Reviewed course of treatment and expected reaction.  Patient advised to expect inflammation and crusting and advised that erosions are possible.  Patient advised to be diligent with sun protection during and after treatment. Handout with details of how to apply medication and what to expect provided. Counseled to keep medication out of reach of children and pets. Recheck nose, left chest, left cheek at follow-up  Milia - tiny firm white papules - type of cyst - benign-appearing - may be extracted if symptomatic - observe  No follow-ups on file.  Maylene Roes, CMA, am acting as scribe for Darden Dates, MD .  Documentation: I have reviewed the above documentation for accuracy and completeness, and I agree with the above.  Darden Dates, MD

## 2022-06-18 DIAGNOSIS — R262 Difficulty in walking, not elsewhere classified: Secondary | ICD-10-CM | POA: Diagnosis not present

## 2022-06-18 DIAGNOSIS — Z96641 Presence of right artificial hip joint: Secondary | ICD-10-CM | POA: Diagnosis not present

## 2022-06-18 DIAGNOSIS — R2681 Unsteadiness on feet: Secondary | ICD-10-CM | POA: Diagnosis not present

## 2022-06-18 DIAGNOSIS — R2689 Other abnormalities of gait and mobility: Secondary | ICD-10-CM | POA: Diagnosis not present

## 2022-06-20 DIAGNOSIS — R262 Difficulty in walking, not elsewhere classified: Secondary | ICD-10-CM | POA: Diagnosis not present

## 2022-06-20 DIAGNOSIS — Z96641 Presence of right artificial hip joint: Secondary | ICD-10-CM | POA: Diagnosis not present

## 2022-06-20 DIAGNOSIS — R2681 Unsteadiness on feet: Secondary | ICD-10-CM | POA: Diagnosis not present

## 2022-06-20 DIAGNOSIS — R2689 Other abnormalities of gait and mobility: Secondary | ICD-10-CM | POA: Diagnosis not present

## 2022-06-23 DIAGNOSIS — R262 Difficulty in walking, not elsewhere classified: Secondary | ICD-10-CM | POA: Diagnosis not present

## 2022-06-23 DIAGNOSIS — Z96641 Presence of right artificial hip joint: Secondary | ICD-10-CM | POA: Diagnosis not present

## 2022-06-23 DIAGNOSIS — R2689 Other abnormalities of gait and mobility: Secondary | ICD-10-CM | POA: Diagnosis not present

## 2022-06-23 DIAGNOSIS — R2681 Unsteadiness on feet: Secondary | ICD-10-CM | POA: Diagnosis not present

## 2022-06-25 DIAGNOSIS — R262 Difficulty in walking, not elsewhere classified: Secondary | ICD-10-CM | POA: Diagnosis not present

## 2022-06-25 DIAGNOSIS — R2681 Unsteadiness on feet: Secondary | ICD-10-CM | POA: Diagnosis not present

## 2022-06-25 DIAGNOSIS — R2689 Other abnormalities of gait and mobility: Secondary | ICD-10-CM | POA: Diagnosis not present

## 2022-06-25 DIAGNOSIS — Z96641 Presence of right artificial hip joint: Secondary | ICD-10-CM | POA: Diagnosis not present

## 2022-06-28 DIAGNOSIS — R2681 Unsteadiness on feet: Secondary | ICD-10-CM | POA: Diagnosis not present

## 2022-06-28 DIAGNOSIS — R262 Difficulty in walking, not elsewhere classified: Secondary | ICD-10-CM | POA: Diagnosis not present

## 2022-06-28 DIAGNOSIS — Z96641 Presence of right artificial hip joint: Secondary | ICD-10-CM | POA: Diagnosis not present

## 2022-06-28 DIAGNOSIS — R2689 Other abnormalities of gait and mobility: Secondary | ICD-10-CM | POA: Diagnosis not present

## 2022-07-01 DIAGNOSIS — R2681 Unsteadiness on feet: Secondary | ICD-10-CM | POA: Diagnosis not present

## 2022-07-01 DIAGNOSIS — R2689 Other abnormalities of gait and mobility: Secondary | ICD-10-CM | POA: Diagnosis not present

## 2022-07-01 DIAGNOSIS — Z96641 Presence of right artificial hip joint: Secondary | ICD-10-CM | POA: Diagnosis not present

## 2022-07-01 DIAGNOSIS — R262 Difficulty in walking, not elsewhere classified: Secondary | ICD-10-CM | POA: Diagnosis not present

## 2022-07-02 DIAGNOSIS — S72001D Fracture of unspecified part of neck of right femur, subsequent encounter for closed fracture with routine healing: Secondary | ICD-10-CM | POA: Diagnosis not present

## 2022-07-02 DIAGNOSIS — M79604 Pain in right leg: Secondary | ICD-10-CM | POA: Diagnosis not present

## 2022-07-02 DIAGNOSIS — Z96641 Presence of right artificial hip joint: Secondary | ICD-10-CM | POA: Diagnosis not present

## 2022-07-03 DIAGNOSIS — R262 Difficulty in walking, not elsewhere classified: Secondary | ICD-10-CM | POA: Diagnosis not present

## 2022-07-03 DIAGNOSIS — R2681 Unsteadiness on feet: Secondary | ICD-10-CM | POA: Diagnosis not present

## 2022-07-03 DIAGNOSIS — Z96641 Presence of right artificial hip joint: Secondary | ICD-10-CM | POA: Diagnosis not present

## 2022-07-03 DIAGNOSIS — R2689 Other abnormalities of gait and mobility: Secondary | ICD-10-CM | POA: Diagnosis not present

## 2022-07-05 DIAGNOSIS — Z96641 Presence of right artificial hip joint: Secondary | ICD-10-CM | POA: Diagnosis not present

## 2022-07-05 DIAGNOSIS — R2681 Unsteadiness on feet: Secondary | ICD-10-CM | POA: Diagnosis not present

## 2022-07-05 DIAGNOSIS — R262 Difficulty in walking, not elsewhere classified: Secondary | ICD-10-CM | POA: Diagnosis not present

## 2022-07-05 DIAGNOSIS — R2689 Other abnormalities of gait and mobility: Secondary | ICD-10-CM | POA: Diagnosis not present

## 2022-07-07 DIAGNOSIS — R2681 Unsteadiness on feet: Secondary | ICD-10-CM | POA: Diagnosis not present

## 2022-07-07 DIAGNOSIS — R262 Difficulty in walking, not elsewhere classified: Secondary | ICD-10-CM | POA: Diagnosis not present

## 2022-07-07 DIAGNOSIS — R2689 Other abnormalities of gait and mobility: Secondary | ICD-10-CM | POA: Diagnosis not present

## 2022-07-07 DIAGNOSIS — Z96641 Presence of right artificial hip joint: Secondary | ICD-10-CM | POA: Diagnosis not present

## 2022-07-09 DIAGNOSIS — R3915 Urgency of urination: Secondary | ICD-10-CM | POA: Diagnosis not present

## 2022-07-09 DIAGNOSIS — R269 Unspecified abnormalities of gait and mobility: Secondary | ICD-10-CM | POA: Diagnosis not present

## 2022-07-09 DIAGNOSIS — F17210 Nicotine dependence, cigarettes, uncomplicated: Secondary | ICD-10-CM | POA: Diagnosis not present

## 2022-07-09 DIAGNOSIS — K219 Gastro-esophageal reflux disease without esophagitis: Secondary | ICD-10-CM | POA: Diagnosis not present

## 2022-07-09 DIAGNOSIS — D508 Other iron deficiency anemias: Secondary | ICD-10-CM | POA: Diagnosis not present

## 2022-07-09 DIAGNOSIS — E559 Vitamin D deficiency, unspecified: Secondary | ICD-10-CM | POA: Diagnosis not present

## 2022-07-09 DIAGNOSIS — R2681 Unsteadiness on feet: Secondary | ICD-10-CM | POA: Diagnosis not present

## 2022-07-09 DIAGNOSIS — I10 Essential (primary) hypertension: Secondary | ICD-10-CM | POA: Diagnosis not present

## 2022-07-09 DIAGNOSIS — R2689 Other abnormalities of gait and mobility: Secondary | ICD-10-CM | POA: Diagnosis not present

## 2022-07-09 DIAGNOSIS — K5900 Constipation, unspecified: Secondary | ICD-10-CM | POA: Diagnosis not present

## 2022-07-09 DIAGNOSIS — R262 Difficulty in walking, not elsewhere classified: Secondary | ICD-10-CM | POA: Diagnosis not present

## 2022-07-09 DIAGNOSIS — M2170 Unequal limb length (acquired), unspecified site: Secondary | ICD-10-CM | POA: Diagnosis not present

## 2022-07-09 DIAGNOSIS — Z96641 Presence of right artificial hip joint: Secondary | ICD-10-CM | POA: Diagnosis not present

## 2022-07-15 DIAGNOSIS — R2689 Other abnormalities of gait and mobility: Secondary | ICD-10-CM | POA: Diagnosis not present

## 2022-07-15 DIAGNOSIS — R262 Difficulty in walking, not elsewhere classified: Secondary | ICD-10-CM | POA: Diagnosis not present

## 2022-07-15 DIAGNOSIS — R2681 Unsteadiness on feet: Secondary | ICD-10-CM | POA: Diagnosis not present

## 2022-07-15 DIAGNOSIS — Z96641 Presence of right artificial hip joint: Secondary | ICD-10-CM | POA: Diagnosis not present

## 2022-07-16 DIAGNOSIS — R269 Unspecified abnormalities of gait and mobility: Secondary | ICD-10-CM | POA: Diagnosis not present

## 2022-07-16 DIAGNOSIS — R3915 Urgency of urination: Secondary | ICD-10-CM | POA: Diagnosis not present

## 2022-07-16 DIAGNOSIS — R262 Difficulty in walking, not elsewhere classified: Secondary | ICD-10-CM | POA: Diagnosis not present

## 2022-07-16 DIAGNOSIS — R21 Rash and other nonspecific skin eruption: Secondary | ICD-10-CM | POA: Diagnosis not present

## 2022-07-16 DIAGNOSIS — Z96641 Presence of right artificial hip joint: Secondary | ICD-10-CM | POA: Diagnosis not present

## 2022-07-16 DIAGNOSIS — R2689 Other abnormalities of gait and mobility: Secondary | ICD-10-CM | POA: Diagnosis not present

## 2022-07-16 DIAGNOSIS — R2681 Unsteadiness on feet: Secondary | ICD-10-CM | POA: Diagnosis not present

## 2022-07-16 DIAGNOSIS — M2170 Unequal limb length (acquired), unspecified site: Secondary | ICD-10-CM | POA: Diagnosis not present

## 2022-07-16 DIAGNOSIS — S7291XD Unspecified fracture of right femur, subsequent encounter for closed fracture with routine healing: Secondary | ICD-10-CM | POA: Diagnosis not present

## 2022-07-18 DIAGNOSIS — R2681 Unsteadiness on feet: Secondary | ICD-10-CM | POA: Diagnosis not present

## 2022-07-18 DIAGNOSIS — R2689 Other abnormalities of gait and mobility: Secondary | ICD-10-CM | POA: Diagnosis not present

## 2022-07-18 DIAGNOSIS — Z96641 Presence of right artificial hip joint: Secondary | ICD-10-CM | POA: Diagnosis not present

## 2022-07-18 DIAGNOSIS — R262 Difficulty in walking, not elsewhere classified: Secondary | ICD-10-CM | POA: Diagnosis not present

## 2022-07-21 DIAGNOSIS — Z96641 Presence of right artificial hip joint: Secondary | ICD-10-CM | POA: Diagnosis not present

## 2022-07-21 DIAGNOSIS — R262 Difficulty in walking, not elsewhere classified: Secondary | ICD-10-CM | POA: Diagnosis not present

## 2022-07-21 DIAGNOSIS — R2681 Unsteadiness on feet: Secondary | ICD-10-CM | POA: Diagnosis not present

## 2022-07-21 DIAGNOSIS — R2689 Other abnormalities of gait and mobility: Secondary | ICD-10-CM | POA: Diagnosis not present

## 2022-07-24 DIAGNOSIS — R262 Difficulty in walking, not elsewhere classified: Secondary | ICD-10-CM | POA: Diagnosis not present

## 2022-07-24 DIAGNOSIS — R2681 Unsteadiness on feet: Secondary | ICD-10-CM | POA: Diagnosis not present

## 2022-07-24 DIAGNOSIS — R2689 Other abnormalities of gait and mobility: Secondary | ICD-10-CM | POA: Diagnosis not present

## 2022-07-24 DIAGNOSIS — Z96641 Presence of right artificial hip joint: Secondary | ICD-10-CM | POA: Diagnosis not present

## 2022-07-25 DIAGNOSIS — R2681 Unsteadiness on feet: Secondary | ICD-10-CM | POA: Diagnosis not present

## 2022-07-25 DIAGNOSIS — R2689 Other abnormalities of gait and mobility: Secondary | ICD-10-CM | POA: Diagnosis not present

## 2022-07-25 DIAGNOSIS — R262 Difficulty in walking, not elsewhere classified: Secondary | ICD-10-CM | POA: Diagnosis not present

## 2022-07-25 DIAGNOSIS — Z96641 Presence of right artificial hip joint: Secondary | ICD-10-CM | POA: Diagnosis not present

## 2022-07-28 DIAGNOSIS — Z96641 Presence of right artificial hip joint: Secondary | ICD-10-CM | POA: Diagnosis not present

## 2022-07-28 DIAGNOSIS — R262 Difficulty in walking, not elsewhere classified: Secondary | ICD-10-CM | POA: Diagnosis not present

## 2022-07-28 DIAGNOSIS — R2681 Unsteadiness on feet: Secondary | ICD-10-CM | POA: Diagnosis not present

## 2022-07-28 DIAGNOSIS — R2689 Other abnormalities of gait and mobility: Secondary | ICD-10-CM | POA: Diagnosis not present

## 2022-07-30 DIAGNOSIS — R262 Difficulty in walking, not elsewhere classified: Secondary | ICD-10-CM | POA: Diagnosis not present

## 2022-07-30 DIAGNOSIS — M545 Low back pain, unspecified: Secondary | ICD-10-CM | POA: Diagnosis not present

## 2022-07-30 DIAGNOSIS — R2689 Other abnormalities of gait and mobility: Secondary | ICD-10-CM | POA: Diagnosis not present

## 2022-07-30 DIAGNOSIS — R3915 Urgency of urination: Secondary | ICD-10-CM | POA: Diagnosis not present

## 2022-07-30 DIAGNOSIS — Z96641 Presence of right artificial hip joint: Secondary | ICD-10-CM | POA: Diagnosis not present

## 2022-07-30 DIAGNOSIS — M2170 Unequal limb length (acquired), unspecified site: Secondary | ICD-10-CM | POA: Diagnosis not present

## 2022-07-30 DIAGNOSIS — R21 Rash and other nonspecific skin eruption: Secondary | ICD-10-CM | POA: Diagnosis not present

## 2022-07-30 DIAGNOSIS — R269 Unspecified abnormalities of gait and mobility: Secondary | ICD-10-CM | POA: Diagnosis not present

## 2022-07-30 DIAGNOSIS — R2681 Unsteadiness on feet: Secondary | ICD-10-CM | POA: Diagnosis not present

## 2022-07-30 DIAGNOSIS — S7291XD Unspecified fracture of right femur, subsequent encounter for closed fracture with routine healing: Secondary | ICD-10-CM | POA: Diagnosis not present

## 2022-07-31 DIAGNOSIS — R262 Difficulty in walking, not elsewhere classified: Secondary | ICD-10-CM | POA: Diagnosis not present

## 2022-07-31 DIAGNOSIS — R2681 Unsteadiness on feet: Secondary | ICD-10-CM | POA: Diagnosis not present

## 2022-07-31 DIAGNOSIS — Z96641 Presence of right artificial hip joint: Secondary | ICD-10-CM | POA: Diagnosis not present

## 2022-07-31 DIAGNOSIS — R2689 Other abnormalities of gait and mobility: Secondary | ICD-10-CM | POA: Diagnosis not present

## 2022-08-04 DIAGNOSIS — R2681 Unsteadiness on feet: Secondary | ICD-10-CM | POA: Diagnosis not present

## 2022-08-04 DIAGNOSIS — R2689 Other abnormalities of gait and mobility: Secondary | ICD-10-CM | POA: Diagnosis not present

## 2022-08-04 DIAGNOSIS — Z96641 Presence of right artificial hip joint: Secondary | ICD-10-CM | POA: Diagnosis not present

## 2022-08-04 DIAGNOSIS — R262 Difficulty in walking, not elsewhere classified: Secondary | ICD-10-CM | POA: Diagnosis not present

## 2022-08-06 DIAGNOSIS — R2681 Unsteadiness on feet: Secondary | ICD-10-CM | POA: Diagnosis not present

## 2022-08-06 DIAGNOSIS — Z96641 Presence of right artificial hip joint: Secondary | ICD-10-CM | POA: Diagnosis not present

## 2022-08-06 DIAGNOSIS — R262 Difficulty in walking, not elsewhere classified: Secondary | ICD-10-CM | POA: Diagnosis not present

## 2022-08-06 DIAGNOSIS — R2689 Other abnormalities of gait and mobility: Secondary | ICD-10-CM | POA: Diagnosis not present

## 2022-08-08 DIAGNOSIS — Z96641 Presence of right artificial hip joint: Secondary | ICD-10-CM | POA: Diagnosis not present

## 2022-08-08 DIAGNOSIS — R2681 Unsteadiness on feet: Secondary | ICD-10-CM | POA: Diagnosis not present

## 2022-08-08 DIAGNOSIS — R262 Difficulty in walking, not elsewhere classified: Secondary | ICD-10-CM | POA: Diagnosis not present

## 2022-08-08 DIAGNOSIS — R2689 Other abnormalities of gait and mobility: Secondary | ICD-10-CM | POA: Diagnosis not present

## 2022-08-11 DIAGNOSIS — Z96641 Presence of right artificial hip joint: Secondary | ICD-10-CM | POA: Diagnosis not present

## 2022-08-11 DIAGNOSIS — R2689 Other abnormalities of gait and mobility: Secondary | ICD-10-CM | POA: Diagnosis not present

## 2022-08-11 DIAGNOSIS — F411 Generalized anxiety disorder: Secondary | ICD-10-CM | POA: Diagnosis not present

## 2022-08-11 DIAGNOSIS — R262 Difficulty in walking, not elsewhere classified: Secondary | ICD-10-CM | POA: Diagnosis not present

## 2022-08-11 DIAGNOSIS — R2681 Unsteadiness on feet: Secondary | ICD-10-CM | POA: Diagnosis not present

## 2022-08-11 DIAGNOSIS — F331 Major depressive disorder, recurrent, moderate: Secondary | ICD-10-CM | POA: Diagnosis not present

## 2022-08-13 DIAGNOSIS — Z96641 Presence of right artificial hip joint: Secondary | ICD-10-CM | POA: Diagnosis not present

## 2022-08-13 DIAGNOSIS — E559 Vitamin D deficiency, unspecified: Secondary | ICD-10-CM | POA: Diagnosis not present

## 2022-08-13 DIAGNOSIS — R2689 Other abnormalities of gait and mobility: Secondary | ICD-10-CM | POA: Diagnosis not present

## 2022-08-13 DIAGNOSIS — F17210 Nicotine dependence, cigarettes, uncomplicated: Secondary | ICD-10-CM | POA: Diagnosis not present

## 2022-08-13 DIAGNOSIS — I10 Essential (primary) hypertension: Secondary | ICD-10-CM | POA: Diagnosis not present

## 2022-08-13 DIAGNOSIS — K5900 Constipation, unspecified: Secondary | ICD-10-CM | POA: Diagnosis not present

## 2022-08-13 DIAGNOSIS — R262 Difficulty in walking, not elsewhere classified: Secondary | ICD-10-CM | POA: Diagnosis not present

## 2022-08-13 DIAGNOSIS — K219 Gastro-esophageal reflux disease without esophagitis: Secondary | ICD-10-CM | POA: Diagnosis not present

## 2022-08-13 DIAGNOSIS — R21 Rash and other nonspecific skin eruption: Secondary | ICD-10-CM | POA: Diagnosis not present

## 2022-08-13 DIAGNOSIS — R2681 Unsteadiness on feet: Secondary | ICD-10-CM | POA: Diagnosis not present

## 2022-08-13 DIAGNOSIS — D508 Other iron deficiency anemias: Secondary | ICD-10-CM | POA: Diagnosis not present

## 2022-08-13 DIAGNOSIS — N3281 Overactive bladder: Secondary | ICD-10-CM | POA: Diagnosis not present

## 2022-08-15 DIAGNOSIS — R262 Difficulty in walking, not elsewhere classified: Secondary | ICD-10-CM | POA: Diagnosis not present

## 2022-08-15 DIAGNOSIS — R2681 Unsteadiness on feet: Secondary | ICD-10-CM | POA: Diagnosis not present

## 2022-08-15 DIAGNOSIS — R2689 Other abnormalities of gait and mobility: Secondary | ICD-10-CM | POA: Diagnosis not present

## 2022-08-15 DIAGNOSIS — Z96641 Presence of right artificial hip joint: Secondary | ICD-10-CM | POA: Diagnosis not present

## 2022-08-17 DIAGNOSIS — R2689 Other abnormalities of gait and mobility: Secondary | ICD-10-CM | POA: Diagnosis not present

## 2022-08-17 DIAGNOSIS — R2681 Unsteadiness on feet: Secondary | ICD-10-CM | POA: Diagnosis not present

## 2022-08-17 DIAGNOSIS — R262 Difficulty in walking, not elsewhere classified: Secondary | ICD-10-CM | POA: Diagnosis not present

## 2022-08-17 DIAGNOSIS — Z96641 Presence of right artificial hip joint: Secondary | ICD-10-CM | POA: Diagnosis not present

## 2022-08-20 ENCOUNTER — Inpatient Hospital Stay: Payer: PPO | Attending: Nurse Practitioner

## 2022-08-20 ENCOUNTER — Encounter: Payer: Self-pay | Admitting: Oncology

## 2022-08-20 ENCOUNTER — Other Ambulatory Visit: Payer: PPO

## 2022-08-20 ENCOUNTER — Other Ambulatory Visit: Payer: Self-pay | Admitting: *Deleted

## 2022-08-20 ENCOUNTER — Inpatient Hospital Stay (HOSPITAL_BASED_OUTPATIENT_CLINIC_OR_DEPARTMENT_OTHER): Payer: PPO | Admitting: Oncology

## 2022-08-20 ENCOUNTER — Ambulatory Visit: Payer: PPO | Admitting: Oncology

## 2022-08-20 VITALS — BP 150/72 | HR 77 | Resp 18 | Ht 65.0 in | Wt 107.2 lb

## 2022-08-20 DIAGNOSIS — Z79899 Other long term (current) drug therapy: Secondary | ICD-10-CM

## 2022-08-20 DIAGNOSIS — D500 Iron deficiency anemia secondary to blood loss (chronic): Secondary | ICD-10-CM | POA: Diagnosis not present

## 2022-08-20 DIAGNOSIS — D509 Iron deficiency anemia, unspecified: Secondary | ICD-10-CM

## 2022-08-20 DIAGNOSIS — R2689 Other abnormalities of gait and mobility: Secondary | ICD-10-CM | POA: Diagnosis not present

## 2022-08-20 DIAGNOSIS — Z7982 Long term (current) use of aspirin: Secondary | ICD-10-CM | POA: Insufficient documentation

## 2022-08-20 DIAGNOSIS — F17210 Nicotine dependence, cigarettes, uncomplicated: Secondary | ICD-10-CM | POA: Insufficient documentation

## 2022-08-20 DIAGNOSIS — D473 Essential (hemorrhagic) thrombocythemia: Secondary | ICD-10-CM | POA: Insufficient documentation

## 2022-08-20 DIAGNOSIS — Z96641 Presence of right artificial hip joint: Secondary | ICD-10-CM | POA: Diagnosis not present

## 2022-08-20 DIAGNOSIS — R262 Difficulty in walking, not elsewhere classified: Secondary | ICD-10-CM | POA: Diagnosis not present

## 2022-08-20 DIAGNOSIS — R2681 Unsteadiness on feet: Secondary | ICD-10-CM | POA: Diagnosis not present

## 2022-08-20 LAB — CBC WITH DIFFERENTIAL/PLATELET
Abs Immature Granulocytes: 0.02 10*3/uL (ref 0.00–0.07)
Basophils Absolute: 0 10*3/uL (ref 0.0–0.1)
Basophils Relative: 0 %
Eosinophils Absolute: 0.1 10*3/uL (ref 0.0–0.5)
Eosinophils Relative: 2 %
HCT: 34.6 % — ABNORMAL LOW (ref 36.0–46.0)
Hemoglobin: 11.4 g/dL — ABNORMAL LOW (ref 12.0–15.0)
Immature Granulocytes: 1 %
Lymphocytes Relative: 20 %
Lymphs Abs: 0.8 10*3/uL (ref 0.7–4.0)
MCH: 37.6 pg — ABNORMAL HIGH (ref 26.0–34.0)
MCHC: 32.9 g/dL (ref 30.0–36.0)
MCV: 114.2 fL — ABNORMAL HIGH (ref 80.0–100.0)
Monocytes Absolute: 0.3 10*3/uL (ref 0.1–1.0)
Monocytes Relative: 8 %
Neutro Abs: 2.9 10*3/uL (ref 1.7–7.7)
Neutrophils Relative %: 69 %
Platelets: 410 10*3/uL — ABNORMAL HIGH (ref 150–400)
RBC: 3.03 MIL/uL — ABNORMAL LOW (ref 3.87–5.11)
RDW: 14.8 % (ref 11.5–15.5)
WBC: 4.1 10*3/uL (ref 4.0–10.5)
nRBC: 0 % (ref 0.0–0.2)

## 2022-08-20 LAB — IRON AND TIBC
Iron: 74 ug/dL (ref 28–170)
Saturation Ratios: 26 % (ref 10.4–31.8)
TIBC: 281 ug/dL (ref 250–450)
UIBC: 207 ug/dL

## 2022-08-20 LAB — FERRITIN: Ferritin: 156 ng/mL (ref 11–307)

## 2022-08-21 ENCOUNTER — Encounter: Payer: Self-pay | Admitting: Oncology

## 2022-08-21 DIAGNOSIS — R262 Difficulty in walking, not elsewhere classified: Secondary | ICD-10-CM | POA: Diagnosis not present

## 2022-08-21 DIAGNOSIS — R2689 Other abnormalities of gait and mobility: Secondary | ICD-10-CM | POA: Diagnosis not present

## 2022-08-21 DIAGNOSIS — R2681 Unsteadiness on feet: Secondary | ICD-10-CM | POA: Diagnosis not present

## 2022-08-21 DIAGNOSIS — Z96641 Presence of right artificial hip joint: Secondary | ICD-10-CM | POA: Diagnosis not present

## 2022-08-21 NOTE — Progress Notes (Signed)
Hematology/Oncology Consult note [facility]  Telephone:([phone] [phone]  Patient Care Team: Kandyce Rud, MD as PCP - General (Family Medicine) Creig Hines, MD as Consulting Physician (Oncology)   Name of the patient: Tina Ingram  [id number]  06-20-1934   Date of visit: 08/21/22  Diagnosis- JAK2 positive essential thrombocythemia Iron deficiency anemia  Chief complaint/ Reason for visit-up of essential thrombocytosis and iron deficiency anemia  Heme/Onc history: Patient is a 88 year old female with a past medical history significant for hypertension, osteoporosis, history of falls and C. difficile infection referred for thrombocytosis.  She was in the hospital in December 2022 for infectious colitis and was found to have platelet counts between [id number].  Looking back at her prior CBCs patient had a normal platelet count up until June 2022 although she has had a transient platelet count of 1240 in December 2020.  Over the last 6 months her platelet counts have been waxing and waning between [id number].  Patient is a resident of [facility] nursing home.  Thrombocytosis persisted despite correction of iron deficiency.  JAK2 mutation positive.  BCR ABL FISH testing Did not show any BCR abl gene rearrangement.  ESR normal.   Patient was started on Hydrea in April 2023.  Presently on 1000 mg 4 times a week and 500 mg 3 times a week   Patient also has a history of iron deficiency anemia requiring IV iron intermittently    Interval history-patient is doing well at [facility] nursing home.  Denies any acute issues at this time.  Tolerating Hydrea well without any significant side effects  ECOG PS- 2 Pain scale- 0   Review of systems- Review of Systems  Constitutional:  Positive for malaise/fatigue. Negative for chills, fever and weight loss.  HENT:  Negative for congestion, ear discharge and nosebleeds.   Eyes:  Negative for blurred  vision.  Respiratory:  Negative for cough, hemoptysis, sputum production, shortness of breath and wheezing.   Cardiovascular:  Negative for chest pain, palpitations, orthopnea and claudication.  Gastrointestinal:  Negative for abdominal pain, blood in stool, constipation, diarrhea, heartburn, melena, nausea and vomiting.  Genitourinary:  Negative for dysuria, flank pain, frequency, hematuria and urgency.  Musculoskeletal:  Negative for back pain, joint pain and myalgias.  Skin:  Negative for rash.  Neurological:  Negative for dizziness, tingling, focal weakness, seizures, weakness and headaches.  Endo/Heme/Allergies:  Does not bruise/bleed easily.  Psychiatric/Behavioral:  Negative for depression and suicidal ideas. The patient does not have insomnia.       No Known Allergies   Past Medical History:  Diagnosis Date   Basal cell carcinoma 06/21/2020   left posterior shoulder. 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 [facility] 07/23/2021     Past Surgical History:  Procedure Laterality Date   APPENDECTOMY     BREAST BIOPSY Left 05/16/2014   complex sclerosing lesion    BREAST EXCISIONAL BIOPSY Left 2016   surgical exc to remove complex sclerosing lesion   BREAST LUMPECTOMY WITH RADIOACTIVE SEED LOCALIZATION Left 06/20/2014  Procedure: BREAST LUMPECTOMY WITH RADIOACTIVE SEED LOCALIZATION;  Surgeon: Harriette Bouillon, MD;  Location: [facility];  Service: General;  Laterality: Left;   CAROTID ENDARTERECTOMY Right    CATARACT EXTRACTION W/PHACO Right 06/25/2015   Procedure: CATARACT EXTRACTION PHACO AND  INTRAOCULAR LENS PLACEMENT (IOC);  Surgeon: Sallee Lange, MD;  Location: [facility];  Service: Ophthalmology;  Laterality: Right;  [place] 2.01 AP% 24.5 CDE 52.16 Fluid pack lot # [id number]   COLONOSCOPY     COLONOSCOPY WITH PROPOFOL N/A 07/07/2020   Procedure: COLONOSCOPY WITH PROPOFOL;  Surgeon: Regis Bill, MD;  Location: [facility];  Service: Endoscopy;  Laterality: N/A;   ESOPHAGOGASTRODUODENOSCOPY N/A 07/10/2020   Procedure: ESOPHAGOGASTRODUODENOSCOPY (EGD);  Surgeon: Wyline Mood, MD;  Location: [facility] END[facility]COPY;  Service: Gastroenterology;  Laterality: N/A;   ESOPHAGOGASTRODUODENOSCOPY (EGD) WITH PROPOFOL N/A 07/06/2020   Procedure: ESOPHAGOGASTRODUODENOSCOPY (EGD) WITH PROPOFOL;  Surgeon: Midge Minium, MD;  Location: [facility] END[facility]COPY;  Service: Endoscopy;  Laterality: N/A;   ESOPHAGOGASTRODUODENOSCOPY (EGD) WITH PROPOFOL N/A 03/28/2021   Procedure: ESOPHAGOGASTRODUODENOSCOPY (EGD) WITH PROPOFOL;  Surgeon: Toledo, Boykin Nearing, MD;  Location: [facility] ENDOSCOPY;  Service: Gastroenterology;  Laterality: N/A;   EYE SURGERY     KYPHOPLASTY N/A 01/27/2017   Procedure: [id number];  Surgeon: Kennedy Bucker, MD;  Location: [facility];  Service: Orthopedics;  Laterality: N/A;  T-9     Social History   Socioeconomic History   Marital status: Divorced    Spouse name: Not on file   Number of children: Not on file   Years of education: Not on file   Highest education level: Not on file  Occupational History   Not on file  Tobacco Use   Smoking status: Every Day    Current packs/day: 1.00    Types: Cigarettes   Smokeless tobacco: Never  Vaping Use   Vaping status: Never Used  Substance and Sexual Activity   Alcohol use: No   Drug use: Never   Sexual activity: Never  Other Topics Concern   Not on file  Social History Narrative   Not on file   Social Determinants of Health   Financial Resource Strain: Low Risk  (04/14/2022)   Received from [facility], [facility]   Overall  Financial Resource Strain (CARDIA)    Difficulty of Paying Living Expenses: Not hard at all  Food Insecurity: No Food Insecurity (04/14/2022)   Received from [facility], [facility]   Hunger Vital Sign    Worried About Running Out of Food in the Last Year: Never true    Ran Out of Food in the Last Year: Never true  Transportation Needs: No Transportation Needs (04/14/2022)   Received from [facility], [facility]   [facility] - Transportation    Lack of Transportation (Medical): No    Lack of Transportation (Non-Medical): No  Physical Activity: Not on file  Stress: Not on file  Social Connections: Not on file  Intimate Partner Violence: Not on file    Family History  Problem Relation Age of Onset   Breast cancer Neg Hx      Current Outpatient Medications:    acetaminophen (TYLENOL) 325 MG tablet, Take 2 tablets (650 mg total) by mouth every 6 (six) hours as needed for mild pain (or Fever >/= 101)., Disp: , Rfl:    amLODipine (NORVASC) 2.5 MG tablet, Take 2.5 mg by mouth daily., Disp: , Rfl:    aspirin EC 81 MG tablet, Take 81 mg by mouth  daily. Swallow whole., Disp: , Rfl:    busPIRone (BUSPAR) 7.5 MG tablet, Take 7.5 mg by mouth 2 (two) times daily., Disp: , Rfl:    cetirizine (ZYRTEC) 10 MG tablet, Take 10 mg by mouth daily., Disp: , Rfl:    Cholecalciferol (VITAMIN D3) 50 MCG (2000 UT) TABS, Take 1 capsule by mouth daily., Disp: , Rfl:    cyanocobalamin (VITAMIN B12) 1000 MCG tablet, Take 1,000 mcg by mouth daily., Disp: , Rfl:    diclofenac Sodium (VOLTAREN) 1 % GEL, Apply 1 application  topically 4 (four) times daily as needed (pain)., Disp: , Rfl:    docusate sodium (COLACE) 100 MG capsule, Take 100 mg by mouth 2 (two) times daily., Disp: , Rfl:    DULoxetine (CYMBALTA) 20 MG capsule, Take 20 mg by mouth daily., Disp: , Rfl:    fluorouracil (EFUDEX) 5 % cream, Apply topically 2 (two) times daily. Apply BID for 10 days on nose, 2-3 weeks on left cheek, 7-10 days to  affected areas on forehead, Disp: 40 g, Rfl: 0   Glycerin, Laxative, (GLYCERIN, ADULT,) 2 g SUPP, Place 1 suppository rectally daily as needed for moderate constipation., Disp: , Rfl:    hydroxyurea (HYDREA) 500 MG capsule, Take 1 capsule (500 mg total) by mouth as directed. 1 tablet tues, wed, fri. Sat.& Sun. ,  On Mon. &Thurs. Please take 2 tablets May take with food to minimize GI side effects., Disp: 38 capsule, Rfl: 2   ibuprofen (ADVIL) 200 MG tablet, Take 200 mg by mouth every 6 (six) hours as needed., Disp: , Rfl:    iron polysaccharides (NIFEREX) 150 MG capsule, Take 150 mg by mouth daily., Disp: , Rfl:    melatonin 3 MG TABS tablet, Take 3 mg by mouth at bedtime., Disp: , Rfl:    Melatonin 3-10 MG TABS, Take by mouth., Disp: , Rfl:    Multiple Vitamin (MULTIVITAMIN) tablet, Take 1 tablet by mouth daily., Disp: , Rfl:    Multiple Vitamins-Minerals (MULTIVITAMIN WITH MINERALS) tablet, Take 1 tablet by mouth daily., Disp: , Rfl:    MYRBETRIQ 50 MG TB24 tablet, Take 50 mg by mouth daily., Disp: , Rfl:    ondansetron (ZOFRAN-ODT) 4 MG disintegrating tablet, Take 1 tablet (4 mg total) by mouth every 8 (eight) hours as needed for nausea or vomiting., Disp: 20 tablet, Rfl: 0   polyethylene glycol powder (GLYCOLAX/MIRALAX) 17 GM/SCOOP powder, Take 1 Container by mouth once., Disp: , Rfl:    senna-docusate (SENOKOT-S) 8.6-50 MG tablet, Take 1 tablet by mouth at bedtime as needed for mild constipation., Disp: , Rfl:    traMADol (ULTRAM) 50 MG tablet, Take by mouth every 6 (six) hours as needed., Disp: , Rfl:    traZODone (DESYREL) 50 MG tablet, Take 50 mg by mouth at bedtime., Disp: , Rfl:    hydrochlorothiazide (MICROZIDE) 12.5 MG capsule, Take 12.5 mg by mouth daily. (Patient not taking: Reported on 02/19/2022), Disp: , Rfl:    pantoprazole (PROTONIX) 40 MG tablet, Take 1 tablet (40 mg total) by mouth 2 (two) times daily. (Patient not taking: Reported on 08/20/2022), Disp: 60 tablet, Rfl: 2  Physical  exam:  Vitals:   08/20/22 1137  BP: (!) 150/72  Pulse: 77  Resp: 18  SpO2: 98%  Weight: 107 lb 3.2 oz (48.6 kg)  Height: 5\' 5"  (1.651 m)   Physical Exam Cardiovascular:     Rate and Rhythm: Normal rate and regular rhythm.     Heart sounds: Normal heart  sounds.  Pulmonary:     Effort: Pulmonary effort is normal.     Breath sounds: Normal breath sounds.  Abdominal:     General: Bowel sounds are normal.     Palpations: Abdomen is soft.  Skin:    General: Skin is warm and dry.  Neurological:     Mental Status: She is alert and oriented to person, place, and time.         Latest Ref Rng & Units 02/19/2022   10:53 AM  CMP  Glucose 70 - 99 mg/dL 578   BUN 8 - 23 mg/dL 25   Creatinine 4.69 - 1.00 mg/dL 6.29   Sodium 528 - 413 mmol/L 141   Potassium 3.5 - 5.1 mmol/L 4.9   Chloride 98 - 111 mmol/L 105   CO2 22 - 32 mmol/L 28   Calcium 8.9 - 10.3 mg/dL 24.4   Total Protein 6.5 - 8.1 g/dL 7.3   Total Bilirubin 0.3 - 1.2 mg/dL 0.9   Alkaline Phos 38 - 126 U/L 72   AST 15 - 41 U/L 16   ALT 0 - 44 U/L 10       Latest Ref Rng & Units 08/20/2022   11:25 AM  CBC  WBC 4.0 - 10.5 K/uL 4.1   Hemoglobin 12.0 - 15.0 g/dL 01.0   Hematocrit 27.2 - 46.0 % 34.6   Platelets 150 - 400 K/uL 410      Assessment and plan- Patient is a 88 y.o. female here for follow-up of following issues    essential thrombocytosis: Currently on Hydrea 500 mg alternating with 1000 mg.  Platelets are mildly elevated at 410 but for the most part has remained less than 400.  No need to change Hydrea dosing at this time.  Hemoglobin stable around 11.  Macrocytosis secondary to Hydrea.  History of iron deficiency anemia: Iron studies are currently stable.  CBC ferritin and iron studies in 3 and 6 months and I will see her back in 6 months Visit Diagnosis 1. Iron deficiency anemia due to chronic blood loss   2. Essential thrombocytosis (HCC)   3. High risk medication use      Dr. Owens Shark, MD,  [facility] [phone] 08/21/2022 11:38 AM

## 2022-08-25 DIAGNOSIS — Z96641 Presence of right artificial hip joint: Secondary | ICD-10-CM | POA: Diagnosis not present

## 2022-08-25 DIAGNOSIS — R2681 Unsteadiness on feet: Secondary | ICD-10-CM | POA: Diagnosis not present

## 2022-08-25 DIAGNOSIS — R262 Difficulty in walking, not elsewhere classified: Secondary | ICD-10-CM | POA: Diagnosis not present

## 2022-08-25 DIAGNOSIS — R2689 Other abnormalities of gait and mobility: Secondary | ICD-10-CM | POA: Diagnosis not present

## 2022-08-27 DIAGNOSIS — Z96641 Presence of right artificial hip joint: Secondary | ICD-10-CM | POA: Diagnosis not present

## 2022-08-27 DIAGNOSIS — R2689 Other abnormalities of gait and mobility: Secondary | ICD-10-CM | POA: Diagnosis not present

## 2022-08-27 DIAGNOSIS — R2681 Unsteadiness on feet: Secondary | ICD-10-CM | POA: Diagnosis not present

## 2022-08-27 DIAGNOSIS — R262 Difficulty in walking, not elsewhere classified: Secondary | ICD-10-CM | POA: Diagnosis not present

## 2022-08-29 DIAGNOSIS — R2681 Unsteadiness on feet: Secondary | ICD-10-CM | POA: Diagnosis not present

## 2022-08-29 DIAGNOSIS — R2689 Other abnormalities of gait and mobility: Secondary | ICD-10-CM | POA: Diagnosis not present

## 2022-08-29 DIAGNOSIS — Z96641 Presence of right artificial hip joint: Secondary | ICD-10-CM | POA: Diagnosis not present

## 2022-08-29 DIAGNOSIS — R262 Difficulty in walking, not elsewhere classified: Secondary | ICD-10-CM | POA: Diagnosis not present

## 2022-09-01 DIAGNOSIS — Z96641 Presence of right artificial hip joint: Secondary | ICD-10-CM | POA: Diagnosis not present

## 2022-09-01 DIAGNOSIS — R262 Difficulty in walking, not elsewhere classified: Secondary | ICD-10-CM | POA: Diagnosis not present

## 2022-09-01 DIAGNOSIS — F411 Generalized anxiety disorder: Secondary | ICD-10-CM | POA: Diagnosis not present

## 2022-09-01 DIAGNOSIS — R2681 Unsteadiness on feet: Secondary | ICD-10-CM | POA: Diagnosis not present

## 2022-09-01 DIAGNOSIS — R2689 Other abnormalities of gait and mobility: Secondary | ICD-10-CM | POA: Diagnosis not present

## 2022-09-01 DIAGNOSIS — F331 Major depressive disorder, recurrent, moderate: Secondary | ICD-10-CM | POA: Diagnosis not present

## 2022-09-05 DIAGNOSIS — R262 Difficulty in walking, not elsewhere classified: Secondary | ICD-10-CM | POA: Diagnosis not present

## 2022-09-05 DIAGNOSIS — R2689 Other abnormalities of gait and mobility: Secondary | ICD-10-CM | POA: Diagnosis not present

## 2022-09-05 DIAGNOSIS — Z96641 Presence of right artificial hip joint: Secondary | ICD-10-CM | POA: Diagnosis not present

## 2022-09-05 DIAGNOSIS — R2681 Unsteadiness on feet: Secondary | ICD-10-CM | POA: Diagnosis not present

## 2022-09-08 DIAGNOSIS — R2689 Other abnormalities of gait and mobility: Secondary | ICD-10-CM | POA: Diagnosis not present

## 2022-09-08 DIAGNOSIS — R2681 Unsteadiness on feet: Secondary | ICD-10-CM | POA: Diagnosis not present

## 2022-09-08 DIAGNOSIS — R262 Difficulty in walking, not elsewhere classified: Secondary | ICD-10-CM | POA: Diagnosis not present

## 2022-09-08 DIAGNOSIS — Z96641 Presence of right artificial hip joint: Secondary | ICD-10-CM | POA: Diagnosis not present

## 2022-09-09 DIAGNOSIS — Z96641 Presence of right artificial hip joint: Secondary | ICD-10-CM | POA: Diagnosis not present

## 2022-09-09 DIAGNOSIS — D508 Other iron deficiency anemias: Secondary | ICD-10-CM | POA: Diagnosis not present

## 2022-09-09 DIAGNOSIS — E559 Vitamin D deficiency, unspecified: Secondary | ICD-10-CM | POA: Diagnosis not present

## 2022-09-09 DIAGNOSIS — R21 Rash and other nonspecific skin eruption: Secondary | ICD-10-CM | POA: Diagnosis not present

## 2022-09-09 DIAGNOSIS — K5900 Constipation, unspecified: Secondary | ICD-10-CM | POA: Diagnosis not present

## 2022-09-09 DIAGNOSIS — R262 Difficulty in walking, not elsewhere classified: Secondary | ICD-10-CM | POA: Diagnosis not present

## 2022-09-09 DIAGNOSIS — R2689 Other abnormalities of gait and mobility: Secondary | ICD-10-CM | POA: Diagnosis not present

## 2022-09-09 DIAGNOSIS — R2681 Unsteadiness on feet: Secondary | ICD-10-CM | POA: Diagnosis not present

## 2022-09-09 DIAGNOSIS — N3281 Overactive bladder: Secondary | ICD-10-CM | POA: Diagnosis not present

## 2022-09-09 DIAGNOSIS — I10 Essential (primary) hypertension: Secondary | ICD-10-CM | POA: Diagnosis not present

## 2022-09-09 DIAGNOSIS — F17210 Nicotine dependence, cigarettes, uncomplicated: Secondary | ICD-10-CM | POA: Diagnosis not present

## 2022-09-10 DIAGNOSIS — R2681 Unsteadiness on feet: Secondary | ICD-10-CM | POA: Diagnosis not present

## 2022-09-10 DIAGNOSIS — R2689 Other abnormalities of gait and mobility: Secondary | ICD-10-CM | POA: Diagnosis not present

## 2022-09-10 DIAGNOSIS — R262 Difficulty in walking, not elsewhere classified: Secondary | ICD-10-CM | POA: Diagnosis not present

## 2022-09-10 DIAGNOSIS — Z96641 Presence of right artificial hip joint: Secondary | ICD-10-CM | POA: Diagnosis not present

## 2022-09-14 DIAGNOSIS — Z96641 Presence of right artificial hip joint: Secondary | ICD-10-CM | POA: Diagnosis not present

## 2022-09-14 DIAGNOSIS — R262 Difficulty in walking, not elsewhere classified: Secondary | ICD-10-CM | POA: Diagnosis not present

## 2022-09-14 DIAGNOSIS — R2681 Unsteadiness on feet: Secondary | ICD-10-CM | POA: Diagnosis not present

## 2022-09-14 DIAGNOSIS — R2689 Other abnormalities of gait and mobility: Secondary | ICD-10-CM | POA: Diagnosis not present

## 2022-09-22 ENCOUNTER — Encounter: Payer: Self-pay | Admitting: Dermatology

## 2022-09-22 ENCOUNTER — Ambulatory Visit: Payer: PPO | Admitting: Dermatology

## 2022-09-22 DIAGNOSIS — L570 Actinic keratosis: Secondary | ICD-10-CM

## 2022-09-22 DIAGNOSIS — W908XXA Exposure to other nonionizing radiation, initial encounter: Secondary | ICD-10-CM | POA: Diagnosis not present

## 2022-09-22 DIAGNOSIS — L720 Epidermal cyst: Secondary | ICD-10-CM

## 2022-09-22 DIAGNOSIS — L578 Other skin changes due to chronic exposure to nonionizing radiation: Secondary | ICD-10-CM | POA: Diagnosis not present

## 2022-09-22 DIAGNOSIS — L814 Other melanin hyperpigmentation: Secondary | ICD-10-CM

## 2022-09-22 DIAGNOSIS — Z86007 Personal history of in-situ neoplasm of skin: Secondary | ICD-10-CM | POA: Diagnosis not present

## 2022-09-22 DIAGNOSIS — Z85828 Personal history of other malignant neoplasm of skin: Secondary | ICD-10-CM | POA: Diagnosis not present

## 2022-09-22 DIAGNOSIS — R2689 Other abnormalities of gait and mobility: Secondary | ICD-10-CM | POA: Diagnosis not present

## 2022-09-22 DIAGNOSIS — Z96641 Presence of right artificial hip joint: Secondary | ICD-10-CM | POA: Diagnosis not present

## 2022-09-22 DIAGNOSIS — R2681 Unsteadiness on feet: Secondary | ICD-10-CM | POA: Diagnosis not present

## 2022-09-22 DIAGNOSIS — R262 Difficulty in walking, not elsewhere classified: Secondary | ICD-10-CM | POA: Diagnosis not present

## 2022-09-22 DIAGNOSIS — D229 Melanocytic nevi, unspecified: Secondary | ICD-10-CM

## 2022-09-22 DIAGNOSIS — L821 Other seborrheic keratosis: Secondary | ICD-10-CM

## 2022-09-22 NOTE — Patient Instructions (Signed)

## 2022-09-22 NOTE — Progress Notes (Signed)
Follow-Up Visit   Subjective  Tina Ingram is a 88 y.o. female who presents for the following: Patient here today for AK follow up, and states that she did use the 5FU/Calcipotriene cream on her chest, but is unsure for how long, and was unable to get refills.   Recheck hypertrophic AK of the R lat brow to see if resolved with LN2.  She had a rash which she described as a "heat rash" on the chest, and said that she was prescribed many different creams which her aides applied, but she is unsure what the names of them were.   The patient has spots, moles and lesions to be evaluated, some may be new or changing and the patient may have concern these could be cancer.   The following portions of the chart were reviewed this encounter and updated as appropriate: medications, allergies, medical history  Review of Systems:  No other skin or systemic complaints except as noted in HPI or Assessment and Plan.  Objective  Well appearing patient in no apparent distress; mood and affect are within normal limits.   A focused examination was performed of the following areas: the face, chest, upper back, arms, hands   Relevant exam findings are noted in the Assessment and Plan.  L chest x 1 Erythematous thin papules/macules with gritty scale.     Assessment & Plan   ACTINIC DAMAGE - chronic, secondary to cumulative UV radiation exposure/sun exposure over time - diffuse scaly erythematous macules with underlying dyspigmentation - Recommend daily broad spectrum sunscreen SPF 30+ to sun-exposed areas, reapply every 2 hours as needed.  - Recommend staying in the shade or wearing long sleeves, sun glasses (UVA+UVB protection) and wide brim hats (4-inch brim around the entire circumference of the hat). - Call for new or changing lesions.  SEBORRHEIC KERATOSIS - Stuck-on, waxy, tan-brown papules and/or plaques  - Benign-appearing - Discussed benign etiology and prognosis. - Observe - Call  for any changes  LENTIGINES Exam: scattered tan macules Due to sun exposure Treatment Plan: Benign-appearing, observe. Recommend daily broad spectrum sunscreen SPF 30+ to sun-exposed areas, reapply every 2 hours as needed.  Call for any changes  Milia - face - tiny firm white papules - type of cyst - benign - sometimes these will clear with nightly OTC adapalene/Differin 0.1% gel or retinol. - may be extracted if symptomatic - observe  AK (actinic keratosis) L chest x 1  Actinic keratoses are precancerous spots that appear secondary to cumulative UV radiation exposure/sun exposure over time. They are chronic with expected duration over 1 year. A portion of actinic keratoses will progress to squamous cell carcinoma of the skin. It is not possible to reliably predict which spots will progress to skin cancer and so treatment is recommended to prevent development of skin cancer.  Recommend daily broad spectrum sunscreen SPF 30+ to sun-exposed areas, reapply every 2 hours as needed.  Recommend staying in the shade or wearing long sleeves, sun glasses (UVA+UVB protection) and wide brim hats (4-inch brim around the entire circumference of the hat). Call for new or changing lesions.   Destruction of lesion - L chest x 1 Complexity: simple   Destruction method: cryotherapy   Informed consent: discussed and consent obtained   Timeout:  patient name, date of birth, surgical site, and procedure verified Lesion destroyed using liquid nitrogen: Yes   Region frozen until ice ball extended beyond lesion: Yes   Outcome: patient tolerated procedure well with no complications  Post-procedure details: wound care instructions given    HISTORY OF BASAL CELL CARCINOMA OF THE SKIN - L post shoulder, 07/23/2021 - No evidence of recurrence today - Recommend regular full body skin exams - Recommend daily broad spectrum sunscreen SPF 30+ to sun-exposed areas, reapply every 2 hours as needed.  - Call if  any new or changing lesions are noted between office visits  HISTORY OF SQUAMOUS CELL CARCINOMA IN SITU OF THE SKIN - L vertex scalp, nasal tip  - No evidence of recurrence today - Recommend regular full body skin exams - Recommend daily broad spectrum sunscreen SPF 30+ to sun-exposed areas, reapply every 2 hours as needed.  - Call if any new or changing lesions are noted between office visits   Return in about 6 months (around 03/22/2023) for TBSE, Hx BCC.  Maylene Roes, CMA, am acting as scribe for Elie Goody, MD .  Documentation: I have reviewed the above documentation for accuracy and completeness, and I agree with the above.  Elie Goody, MD

## 2022-09-23 DIAGNOSIS — N3281 Overactive bladder: Secondary | ICD-10-CM | POA: Diagnosis not present

## 2022-09-23 DIAGNOSIS — F17210 Nicotine dependence, cigarettes, uncomplicated: Secondary | ICD-10-CM | POA: Diagnosis not present

## 2022-09-23 DIAGNOSIS — E559 Vitamin D deficiency, unspecified: Secondary | ICD-10-CM | POA: Diagnosis not present

## 2022-09-23 DIAGNOSIS — Z96641 Presence of right artificial hip joint: Secondary | ICD-10-CM | POA: Diagnosis not present

## 2022-09-23 DIAGNOSIS — R2681 Unsteadiness on feet: Secondary | ICD-10-CM | POA: Diagnosis not present

## 2022-09-23 DIAGNOSIS — K5900 Constipation, unspecified: Secondary | ICD-10-CM | POA: Diagnosis not present

## 2022-09-23 DIAGNOSIS — D508 Other iron deficiency anemias: Secondary | ICD-10-CM | POA: Diagnosis not present

## 2022-09-23 DIAGNOSIS — R262 Difficulty in walking, not elsewhere classified: Secondary | ICD-10-CM | POA: Diagnosis not present

## 2022-09-23 DIAGNOSIS — R2689 Other abnormalities of gait and mobility: Secondary | ICD-10-CM | POA: Diagnosis not present

## 2022-09-23 DIAGNOSIS — I10 Essential (primary) hypertension: Secondary | ICD-10-CM | POA: Diagnosis not present

## 2022-10-07 DIAGNOSIS — F331 Major depressive disorder, recurrent, moderate: Secondary | ICD-10-CM | POA: Diagnosis not present

## 2022-10-07 DIAGNOSIS — F411 Generalized anxiety disorder: Secondary | ICD-10-CM | POA: Diagnosis not present

## 2022-10-16 DIAGNOSIS — Z96641 Presence of right artificial hip joint: Secondary | ICD-10-CM | POA: Diagnosis not present

## 2022-10-16 DIAGNOSIS — S72001D Fracture of unspecified part of neck of right femur, subsequent encounter for closed fracture with routine healing: Secondary | ICD-10-CM | POA: Diagnosis not present

## 2022-10-16 DIAGNOSIS — I10 Essential (primary) hypertension: Secondary | ICD-10-CM | POA: Diagnosis not present

## 2022-10-16 DIAGNOSIS — Z66 Do not resuscitate: Secondary | ICD-10-CM | POA: Diagnosis not present

## 2022-11-13 DIAGNOSIS — N3281 Overactive bladder: Secondary | ICD-10-CM | POA: Diagnosis not present

## 2022-11-13 DIAGNOSIS — F17210 Nicotine dependence, cigarettes, uncomplicated: Secondary | ICD-10-CM | POA: Diagnosis not present

## 2022-11-13 DIAGNOSIS — K5900 Constipation, unspecified: Secondary | ICD-10-CM | POA: Diagnosis not present

## 2022-11-13 DIAGNOSIS — D508 Other iron deficiency anemias: Secondary | ICD-10-CM | POA: Diagnosis not present

## 2022-11-13 DIAGNOSIS — I130 Hypertensive heart and chronic kidney disease with heart failure and stage 1 through stage 4 chronic kidney disease, or unspecified chronic kidney disease: Secondary | ICD-10-CM | POA: Diagnosis not present

## 2022-11-20 ENCOUNTER — Inpatient Hospital Stay: Payer: PPO | Attending: Nurse Practitioner

## 2022-11-20 DIAGNOSIS — D500 Iron deficiency anemia secondary to blood loss (chronic): Secondary | ICD-10-CM

## 2022-11-20 DIAGNOSIS — F17210 Nicotine dependence, cigarettes, uncomplicated: Secondary | ICD-10-CM | POA: Insufficient documentation

## 2022-11-20 DIAGNOSIS — D473 Essential (hemorrhagic) thrombocythemia: Secondary | ICD-10-CM | POA: Insufficient documentation

## 2022-11-20 DIAGNOSIS — Z79899 Other long term (current) drug therapy: Secondary | ICD-10-CM | POA: Insufficient documentation

## 2022-11-20 DIAGNOSIS — Z7982 Long term (current) use of aspirin: Secondary | ICD-10-CM | POA: Diagnosis not present

## 2022-11-20 DIAGNOSIS — D509 Iron deficiency anemia, unspecified: Secondary | ICD-10-CM | POA: Diagnosis not present

## 2022-11-20 LAB — CBC
HCT: 33.1 % — ABNORMAL LOW (ref 36.0–46.0)
Hemoglobin: 11 g/dL — ABNORMAL LOW (ref 12.0–15.0)
MCH: 39 pg — ABNORMAL HIGH (ref 26.0–34.0)
MCHC: 33.2 g/dL (ref 30.0–36.0)
MCV: 117.4 fL — ABNORMAL HIGH (ref 80.0–100.0)
Platelets: 410 10*3/uL — ABNORMAL HIGH (ref 150–400)
RBC: 2.82 MIL/uL — ABNORMAL LOW (ref 3.87–5.11)
RDW: 13.2 % (ref 11.5–15.5)
WBC: 5.5 10*3/uL (ref 4.0–10.5)
nRBC: 0 % (ref 0.0–0.2)

## 2022-11-20 LAB — IRON AND TIBC
Iron: 52 ug/dL (ref 28–170)
Saturation Ratios: 21 % (ref 10.4–31.8)
TIBC: 251 ug/dL (ref 250–450)
UIBC: 199 ug/dL

## 2022-11-20 LAB — FERRITIN: Ferritin: 271 ng/mL (ref 11–307)

## 2022-11-28 DIAGNOSIS — N3281 Overactive bladder: Secondary | ICD-10-CM | POA: Diagnosis not present

## 2022-11-28 DIAGNOSIS — I130 Hypertensive heart and chronic kidney disease with heart failure and stage 1 through stage 4 chronic kidney disease, or unspecified chronic kidney disease: Secondary | ICD-10-CM | POA: Diagnosis not present

## 2022-11-28 DIAGNOSIS — N1832 Chronic kidney disease, stage 3b: Secondary | ICD-10-CM | POA: Diagnosis not present

## 2022-11-28 DIAGNOSIS — D508 Other iron deficiency anemias: Secondary | ICD-10-CM | POA: Diagnosis not present

## 2022-11-28 DIAGNOSIS — K5900 Constipation, unspecified: Secondary | ICD-10-CM | POA: Diagnosis not present

## 2022-11-28 DIAGNOSIS — E559 Vitamin D deficiency, unspecified: Secondary | ICD-10-CM | POA: Diagnosis not present

## 2022-11-28 DIAGNOSIS — F17210 Nicotine dependence, cigarettes, uncomplicated: Secondary | ICD-10-CM | POA: Diagnosis not present

## 2022-12-03 DIAGNOSIS — Z961 Presence of intraocular lens: Secondary | ICD-10-CM | POA: Diagnosis not present

## 2022-12-08 DIAGNOSIS — I130 Hypertensive heart and chronic kidney disease with heart failure and stage 1 through stage 4 chronic kidney disease, or unspecified chronic kidney disease: Secondary | ICD-10-CM | POA: Diagnosis not present

## 2022-12-08 DIAGNOSIS — N1832 Chronic kidney disease, stage 3b: Secondary | ICD-10-CM | POA: Diagnosis not present

## 2022-12-10 DIAGNOSIS — D508 Other iron deficiency anemias: Secondary | ICD-10-CM | POA: Diagnosis not present

## 2022-12-10 DIAGNOSIS — R5382 Chronic fatigue, unspecified: Secondary | ICD-10-CM | POA: Diagnosis not present

## 2022-12-10 DIAGNOSIS — F32A Depression, unspecified: Secondary | ICD-10-CM | POA: Diagnosis not present

## 2022-12-10 DIAGNOSIS — F419 Anxiety disorder, unspecified: Secondary | ICD-10-CM | POA: Diagnosis not present

## 2022-12-10 DIAGNOSIS — N3281 Overactive bladder: Secondary | ICD-10-CM | POA: Diagnosis not present

## 2022-12-16 DIAGNOSIS — R195 Other fecal abnormalities: Secondary | ICD-10-CM | POA: Diagnosis not present

## 2022-12-16 DIAGNOSIS — R1110 Vomiting, unspecified: Secondary | ICD-10-CM | POA: Diagnosis not present

## 2022-12-16 DIAGNOSIS — M6281 Muscle weakness (generalized): Secondary | ICD-10-CM | POA: Diagnosis not present

## 2022-12-16 DIAGNOSIS — K5900 Constipation, unspecified: Secondary | ICD-10-CM | POA: Diagnosis not present

## 2022-12-16 DIAGNOSIS — R5382 Chronic fatigue, unspecified: Secondary | ICD-10-CM | POA: Diagnosis not present

## 2022-12-16 DIAGNOSIS — R112 Nausea with vomiting, unspecified: Secondary | ICD-10-CM | POA: Diagnosis not present

## 2022-12-16 DIAGNOSIS — R630 Anorexia: Secondary | ICD-10-CM | POA: Diagnosis not present

## 2022-12-16 DIAGNOSIS — R5383 Other fatigue: Secondary | ICD-10-CM | POA: Diagnosis not present

## 2022-12-16 DIAGNOSIS — I130 Hypertensive heart and chronic kidney disease with heart failure and stage 1 through stage 4 chronic kidney disease, or unspecified chronic kidney disease: Secondary | ICD-10-CM | POA: Diagnosis not present

## 2022-12-16 DIAGNOSIS — N1832 Chronic kidney disease, stage 3b: Secondary | ICD-10-CM | POA: Diagnosis not present

## 2022-12-16 DIAGNOSIS — D508 Other iron deficiency anemias: Secondary | ICD-10-CM | POA: Diagnosis not present

## 2022-12-16 DIAGNOSIS — E559 Vitamin D deficiency, unspecified: Secondary | ICD-10-CM | POA: Diagnosis not present

## 2022-12-16 DIAGNOSIS — R1011 Right upper quadrant pain: Secondary | ICD-10-CM | POA: Diagnosis not present

## 2022-12-16 DIAGNOSIS — R634 Abnormal weight loss: Secondary | ICD-10-CM | POA: Diagnosis not present

## 2022-12-18 ENCOUNTER — Emergency Department: Payer: PPO

## 2022-12-18 ENCOUNTER — Other Ambulatory Visit: Payer: Self-pay

## 2022-12-18 ENCOUNTER — Encounter: Payer: Self-pay | Admitting: Emergency Medicine

## 2022-12-18 DIAGNOSIS — G9389 Other specified disorders of brain: Secondary | ICD-10-CM | POA: Diagnosis not present

## 2022-12-18 DIAGNOSIS — R4781 Slurred speech: Secondary | ICD-10-CM | POA: Diagnosis not present

## 2022-12-18 DIAGNOSIS — K8020 Calculus of gallbladder without cholecystitis without obstruction: Secondary | ICD-10-CM | POA: Insufficient documentation

## 2022-12-18 DIAGNOSIS — R160 Hepatomegaly, not elsewhere classified: Secondary | ICD-10-CM | POA: Diagnosis not present

## 2022-12-18 DIAGNOSIS — I6523 Occlusion and stenosis of bilateral carotid arteries: Secondary | ICD-10-CM | POA: Diagnosis not present

## 2022-12-18 DIAGNOSIS — R634 Abnormal weight loss: Secondary | ICD-10-CM | POA: Diagnosis not present

## 2022-12-18 DIAGNOSIS — I7143 Infrarenal abdominal aortic aneurysm, without rupture: Secondary | ICD-10-CM | POA: Diagnosis not present

## 2022-12-18 DIAGNOSIS — K828 Other specified diseases of gallbladder: Secondary | ICD-10-CM | POA: Diagnosis not present

## 2022-12-18 DIAGNOSIS — G936 Cerebral edema: Secondary | ICD-10-CM | POA: Diagnosis not present

## 2022-12-18 DIAGNOSIS — K7689 Other specified diseases of liver: Secondary | ICD-10-CM | POA: Diagnosis not present

## 2022-12-18 DIAGNOSIS — R1011 Right upper quadrant pain: Secondary | ICD-10-CM | POA: Diagnosis not present

## 2022-12-18 DIAGNOSIS — R627 Adult failure to thrive: Secondary | ICD-10-CM | POA: Insufficient documentation

## 2022-12-18 DIAGNOSIS — M6281 Muscle weakness (generalized): Secondary | ICD-10-CM | POA: Diagnosis not present

## 2022-12-18 DIAGNOSIS — R4182 Altered mental status, unspecified: Secondary | ICD-10-CM | POA: Diagnosis not present

## 2022-12-18 DIAGNOSIS — R000 Tachycardia, unspecified: Secondary | ICD-10-CM | POA: Diagnosis not present

## 2022-12-18 DIAGNOSIS — R932 Abnormal findings on diagnostic imaging of liver and biliary tract: Secondary | ICD-10-CM | POA: Diagnosis not present

## 2022-12-18 DIAGNOSIS — D508 Other iron deficiency anemias: Secondary | ICD-10-CM | POA: Diagnosis not present

## 2022-12-18 DIAGNOSIS — C799 Secondary malignant neoplasm of unspecified site: Secondary | ICD-10-CM | POA: Diagnosis not present

## 2022-12-18 DIAGNOSIS — C801 Malignant (primary) neoplasm, unspecified: Secondary | ICD-10-CM | POA: Diagnosis not present

## 2022-12-18 DIAGNOSIS — R531 Weakness: Secondary | ICD-10-CM | POA: Diagnosis not present

## 2022-12-18 DIAGNOSIS — C787 Secondary malignant neoplasm of liver and intrahepatic bile duct: Secondary | ICD-10-CM | POA: Diagnosis not present

## 2022-12-18 DIAGNOSIS — N1832 Chronic kidney disease, stage 3b: Secondary | ICD-10-CM | POA: Diagnosis not present

## 2022-12-18 LAB — COMPREHENSIVE METABOLIC PANEL
ALT: 48 U/L — ABNORMAL HIGH (ref 0–44)
AST: 81 U/L — ABNORMAL HIGH (ref 15–41)
Albumin: 3.4 g/dL — ABNORMAL LOW (ref 3.5–5.0)
Alkaline Phosphatase: 189 U/L — ABNORMAL HIGH (ref 38–126)
Anion gap: 9 (ref 5–15)
BUN: 40 mg/dL — ABNORMAL HIGH (ref 8–23)
CO2: 26 mmol/L (ref 22–32)
Calcium: 9.5 mg/dL (ref 8.9–10.3)
Chloride: 101 mmol/L (ref 98–111)
Creatinine, Ser: 0.95 mg/dL (ref 0.44–1.00)
GFR, Estimated: 58 mL/min — ABNORMAL LOW (ref >60)
Glucose, Bld: 145 mg/dL — ABNORMAL HIGH (ref 70–99)
Potassium: 3.9 mmol/L (ref 3.5–5.1)
Sodium: 136 mmol/L (ref 135–145)
Total Bilirubin: 0.8 mg/dL (ref <1.2)
Total Protein: 6.4 g/dL — ABNORMAL LOW (ref 6.5–8.1)

## 2022-12-18 LAB — CBC
HCT: 33.3 % — ABNORMAL LOW (ref 36.0–46.0)
Hemoglobin: 10.9 g/dL — ABNORMAL LOW (ref 12.0–15.0)
MCH: 38.7 pg — ABNORMAL HIGH (ref 26.0–34.0)
MCHC: 32.7 g/dL (ref 30.0–36.0)
MCV: 118.1 fL — ABNORMAL HIGH (ref 80.0–100.0)
Platelets: 467 10*3/uL — ABNORMAL HIGH (ref 150–400)
RBC: 2.82 MIL/uL — ABNORMAL LOW (ref 3.87–5.11)
RDW: 14.1 % (ref 11.5–15.5)
WBC: 4.5 10*3/uL (ref 4.0–10.5)
nRBC: 0 % (ref 0.0–0.2)

## 2022-12-18 LAB — LIPASE, BLOOD: Lipase: 25 U/L (ref 11–51)

## 2022-12-18 NOTE — ED Triage Notes (Signed)
PT from [facility] via [facility] with reports of pt eating less x 1 week and "not acting like normal self".  143/61 89HR 96RA

## 2022-12-18 NOTE — ED Triage Notes (Signed)
Patient to ED via [facility] from [facility] due to patient "not acting like self." Facility reports that she has been eating less for the past week. Pt report that facility is concerned for dehydration. Patient alert to self and place but disoriented to time. Pt denies pain at this time but states that she does get right sided flank pain at times.

## 2022-12-19 ENCOUNTER — Emergency Department: Payer: PPO

## 2022-12-19 ENCOUNTER — Emergency Department
Admission: EM | Admit: 2022-12-19 | Discharge: 2022-12-19 | Disposition: A | Discharge status: Home / Self Care | Payer: PPO | Attending: Emergency Medicine | Admitting: Emergency Medicine

## 2022-12-19 DIAGNOSIS — I6523 Occlusion and stenosis of bilateral carotid arteries: Secondary | ICD-10-CM | POA: Diagnosis not present

## 2022-12-19 DIAGNOSIS — K8020 Calculus of gallbladder without cholecystitis without obstruction: Secondary | ICD-10-CM

## 2022-12-19 DIAGNOSIS — M6281 Muscle weakness (generalized): Secondary | ICD-10-CM | POA: Diagnosis not present

## 2022-12-19 DIAGNOSIS — G9389 Other specified disorders of brain: Secondary | ICD-10-CM | POA: Diagnosis not present

## 2022-12-19 DIAGNOSIS — Z743 Need for continuous supervision: Secondary | ICD-10-CM | POA: Diagnosis not present

## 2022-12-19 DIAGNOSIS — G936 Cerebral edema: Secondary | ICD-10-CM | POA: Diagnosis not present

## 2022-12-19 DIAGNOSIS — C799 Secondary malignant neoplasm of unspecified site: Secondary | ICD-10-CM

## 2022-12-19 DIAGNOSIS — I7143 Infrarenal abdominal aortic aneurysm, without rupture: Secondary | ICD-10-CM | POA: Diagnosis not present

## 2022-12-19 DIAGNOSIS — C787 Secondary malignant neoplasm of liver and intrahepatic bile duct: Secondary | ICD-10-CM | POA: Diagnosis not present

## 2022-12-19 DIAGNOSIS — R932 Abnormal findings on diagnostic imaging of liver and biliary tract: Secondary | ICD-10-CM | POA: Diagnosis not present

## 2022-12-19 DIAGNOSIS — R4182 Altered mental status, unspecified: Secondary | ICD-10-CM | POA: Diagnosis not present

## 2022-12-19 LAB — TROPONIN I (HIGH SENSITIVITY): Troponin I (High Sensitivity): 22 ng/L — ABNORMAL HIGH (ref <18)

## 2022-12-19 MED ORDER — IOHEXOL 300 MG/ML  SOLN
75.0000 mL | Freq: Once | INTRAMUSCULAR | Status: AC | PRN
  Administered 2022-12-19: 75 mL via INTRAVENOUS

## 2022-12-19 MED ORDER — LACTATED RINGERS IV BOLUS
1000.0000 mL | Freq: Once | INTRAVENOUS | Status: AC
  Administered 2022-12-19: 1000 mL via INTRAVENOUS

## 2022-12-19 NOTE — ED Notes (Signed)
Patient iv infiltrated,  approximately 200 cc of lr,  compression added to affected arm

## 2022-12-19 NOTE — ED Provider Notes (Signed)
[facility] Provider Note    Event Date/Time   First MD Initiated Contact with Patient 12/19/22 0222     (approximate)   History   Failure To Thrive   HPI  Tina Ingram is a 88 y.o. female who presents to the ED for evaluation of Failure To Thrive   Patient presents to the ED for evaluation of generalized weakness, poor intake, unintentional weight loss, slurred speech, dramatically increased sleep in a subacute timeframe.  All history is provided by patient's grandson, Tina Ingram, over the phone.  Patient is somewhat disoriented and reports feeling fine and has no complaints when I see her.   Physical Exam   Triage Vital Signs: ED Triage Vitals [12/18/22 1520]  Encounter Vitals Group     BP 121/63     Systolic BP Percentile      Diastolic BP Percentile      Pulse Rate 94     Resp 18     Temp 98.1 F (36.7 C)     Temp Source Oral     SpO2 96 %     Weight      Height      Head Circumference      Peak Flow      Pain Score 0     Pain Loc      Pain Education      Exclude from Growth Chart     Most recent vital signs: Vitals:   12/19/22 0500 12/19/22 0530  BP: (!) 146/65 (!) 167/70  Pulse: (!) 102 (!) 102  Resp:    Temp:    SpO2: 95% 95%    General: Awake, no distress.  Oriented to self and location, but not year or situation.  Pleasant and conversational CV:  Good peripheral perfusion.  Resp:  Normal effort.  Abd:  No distention.  RUQ tenderness without peritoneal features. MSK:  No deformity noted.  Neuro:  No focal deficits appreciated. Other:     ED Results / Procedures / Treatments   Labs (all labs ordered are listed, but only abnormal results are displayed) Labs Reviewed  COMPREHENSIVE METABOLIC PANEL - Abnormal; Notable for the following components:      Result Value   Glucose, Bld 145 (*)    BUN 40 (*)    Total Protein 6.4 (*)    Albumin 3.4 (*)    AST 81 (*)    ALT 48 (*)    Alkaline Phosphatase 189  (*)    GFR, Estimated 58 (*)    All other components within normal limits  CBC - Abnormal; Notable for the following components:   RBC 2.82 (*)    Hemoglobin 10.9 (*)    HCT 33.3 (*)    MCV 118.1 (*)    MCH 38.7 (*)    Platelets 467 (*)    All other components within normal limits  TROPONIN I (HIGH SENSITIVITY) - Abnormal; Notable for the following components:   Troponin I (High Sensitivity) 22 (*)    All other components within normal limits  LIPASE, BLOOD  URINALYSIS, ROUTINE W REFLEX MICROSCOPIC    EKG Sinus rhythm with a rate of 94 bpm.  Normal axis.  First-degree AV block without high-grade block.  No STEMI.  RADIOLOGY CT head interpreted by me with right occipital mass CT abdomen/pelvis interpreted by me with liver metastases and gallstones Left lung mass RUQ ultrasound interpreted by me with gallstones  Official radiology report(s): CT ABDOMEN PELVIS W  CONTRAST  Result Date: 12/19/2022 CLINICAL DATA:  Acute nonlocalized abdominal pain. EXAM: CT ABDOMEN AND PELVIS WITH CONTRAST TECHNIQUE: Multidetector CT imaging of the abdomen and pelvis was performed using the standard protocol following bolus administration of intravenous contrast. RADIATION DOSE REDUCTION: This exam was performed according to the departmental dose-optimization program which includes automated exposure control, adjustment of the mA and/or kV according to patient size and/or use of iterative reconstruction technique. CONTRAST:  75mL OMNIPAQUE IOHEXOL 300 MG/ML  SOLN COMPARISON:  Ultrasound abdomen 12/18/2022. CT abdomen and pelvis 12/11/2020 FINDINGS: Lower chest: Mass lesion in the left lower lung measuring 4 x 3.6 cm in diameter. This is new since the prior CT and suspicious for primary or metastatic malignancy. Atelectasis in the lung bases. Cardiac enlargement. Calcification of the aorta and coronary arteries. Hepatobiliary: Numerous hypodense lesions fills the liver. Representative lesion in the right lobe  of the liver on series 2, image 14 measures 4.6 x 3 cm in diameter. Appearances consistent with multiple hepatic metastasis. Gallbladder is decompressed. Mild pericholecystic edema is likely related to liver disease. No bile duct dilatation. Pancreas: Unremarkable. No pancreatic ductal dilatation or surrounding inflammatory changes. Spleen: Normal in size without focal abnormality. Adrenals/Urinary Tract: No adrenal gland nodules. Bilateral renal cysts. Largest in the right lower pole measures 6 cm diameter. No change since prior study. No imaging follow-up is indicated. No hydronephrosis or hydroureter. Bladder is normal. Stomach/Bowel: Stomach is decompressed. Small bowel are also mostly decompressed. Large amount of stool throughout the colon. No wall thickening or inflammatory changes are appreciated. Appendix appears surgically absent. Vascular/Lymphatic: Diffuse aortic calcification. Infrarenal abdominal aortic aneurysm measuring 2.7 by 3.3 cm diameter. No significant change since prior study. No significant lymphadenopathy is seen. Reproductive: Focal calcification in the uterus likely representing a uterine fibroid. No abnormal adnexal masses. Other: No free air or free fluid in the abdomen. Abdominal wall musculature appears intact. Musculoskeletal: Postoperative right hip arthroplasty. Degenerative changes throughout the spine. Lumbar scoliosis convex towards the right. Old right rib fractures. Compression deformities of L2, T11, and T9 with kyphoplasty at the thoracic levels. The L2 level has progressed since prior study. IMPRESSION: 1. Multiple metastasis fills the liver. 2. 4 cm mass in the left lung base may represent primary or metastatic disease. Consider PET-CT or tissue sampling. 3. Abdominal aortic aneurysm measuring 3.3 cm. Recommend surveillance ultrasound in 3 years. Reference: Journal of Vascular Surgery 67.1 (2018): 2-77. J Am Coll Radiol 2013;10:789-794. 4. Severe aortic atherosclerosis. 5.  Calcified uterine fibroid. Electronically Signed   By: Burman Nieves M.D.   On: 12/19/2022 01:38   CT Head Wo Contrast  Result Date: 12/19/2022 CLINICAL DATA:  Mental status change EXAM: CT HEAD WITHOUT CONTRAST TECHNIQUE: Contiguous axial images were obtained from the base of the skull through the vertex without intravenous contrast. RADIATION DOSE REDUCTION: This exam was performed according to the departmental dose-optimization program which includes automated exposure control, adjustment of the mA and/or kV according to patient size and/or use of iterative reconstruction technique. COMPARISON:  Head CT 12/11/2020 FINDINGS: Brain: Right frontal craniotomy changes are again seen. There is new encephalomalacia in the right frontal lobe under the craniotomy when compared to 2022. Encephalomalacia in the posterior right parietal lobe is similar to the prior study. Low-attenuation lesion with surrounding vasogenic edema is noted in the right cerebellar hemisphere. Lesion measures proximally 2.7 by 2.3 cm. There is local mass effect with 5 mm of midline shift to the left the level of the fourth ventricle. Ventricular  size and configuration appears similar to the prior study. There is no acute intracranial hemorrhage or acute extra-axial fluid collection. There is moderate periventricular white matter hypodensity similar to the prior study. Vascular: Atherosclerotic calcifications are present within the cavernous internal carotid arteries. Skull: No acute fractures are seen. Sinuses/Orbits: There is a small amount of fluid in the left sphenoid sinus. The orbits are within normal limits. Other: None. IMPRESSION: 1. Low-attenuation lesion with surrounding vasogenic edema in the right cerebellar hemisphere measuring up to 2.7 cm. Findings are worrisome for metastatic disease. MRI with and without contrast is recommended for further evaluation. 2. Local mass effect with 5 mm of midline shift to the left at the level  of the fourth ventricle. 3. Right frontal craniotomy changes with new encephalomalacia in the right frontal lobe under the craniotomy when compared to 2022. 4. Stable encephalomalacia in the posterior right parietal lobe. 5. Stable moderate chronic small vessel ischemic changes. 6. Small amount of fluid in the left sphenoid sinus. Correlate clinically for sinusitis. Electronically Signed   By: Darliss Cheney M.D.   On: 12/19/2022 01:38   US ABDOMEN LIMITED RUQ (LIVER/GB)  Result Date: 12/18/2022 CLINICAL DATA:  Abdominal pain EXAM: ULTRASOUND ABDOMEN LIMITED RIGHT UPPER QUADRANT COMPARISON:  Ultrasound 03/08/2021. FINDINGS: Gallbladder: Gallbladder is distended with borderline wall thickening. Mobile stones. No adjacent fluid. No reported sonographic Murphy's sign. Common bile duct: Diameter: 7 mm.  Within normal limits for patient's age. Liver: Heterogeneous nodular liver. Multiple mass lesions identified measuring up to 18 mm. Portal vein is patent on color Doppler imaging with normal direction of blood flow towards the liver. Other: None. IMPRESSION: Heterogeneous nodular liver with multiple hypoechoic masses identified. Please correlate for any history of known malignancy and recommend dedicated postcontrast examination is recommended of the abdomen pelvis such as CT. Stones in the distended gallbladder. Borderline wall thickening. No reported sonographic Murphy's sign. No ductal dilatation. Electronically Signed   By: Karen Kays M.D.   On: 12/18/2022 18:10    PROCEDURES and INTERVENTIONS:  .1-3 Lead EKG Interpretation  Performed by: Delton Prairie, MD Authorized by: Delton Prairie, MD     Interpretation: abnormal     ECG rate:  104   ECG rate assessment: tachycardic     Rhythm: sinus tachycardia     Ectopy: none     Conduction: normal     Medications  iohexol (OMNIPAQUE) 300 MG/ML solution 75 mL (75 mLs Intravenous Contrast Given 12/19/22 0107)  lactated ringers bolus 1,000 mL (0 mLs  Intravenous Stopped 12/19/22 0548)     IMPRESSION / MDM / ASSESSMENT AND PLAN / ED COURSE  I reviewed the triage vital signs and the nursing notes.  Differential diagnosis includes, but is not limited to, dehydration, AKI, sepsis, stroke, metabolic cephalopathy  {Patient presents with symptoms of an acute illness or injury that is potentially life-threatening.  Patient presents with progressive subacute symptoms likely due to metastatic cancer and suitable for outpatient management with hospice.  Him to [facility] stable and without complaints.  Some localized abdominal tenderness but fairly benign exam.  Blood work with transaminitis that is mild, no leukocytosis.  Normal lipase.  Marginal troponin elevation.  Imaging, as above, with multiple metastases.  As below, lengthy conversation with the grandson.  We discussed the poor prognosis and he is agreeable for her to go back to SNF and discussed with hospice care, which I think is most reasonable.  Clinical Course as of 12/19/22 0610  Fri Dec 19, 2022  6010 I call her grandson, Brinna Rosasco. No response. Leave VM to call back to the ER.  [DS]  0332 He calls back. Lost 10lbs in 30 days, slurred speech. Sleeping a lot more.  [DS]  0340 We discussed workup with imaging of various  metastases, uncertain primary etiology but an overall concerning clinical picture for widely spread metastatic cancer.  Also discussed gallstones.  Discussed blood work with minor LFT derangements but otherwise largely nonacute.  After discussing risks and benefits, options including medical admission and oncology evaluation, he agrees that going back to her SNF would be reasonable and he will reach back out to hospice later today and update them of what we have found.  He seems to understand the severity of situation and poor prognosis for the patient. [DS]    Clinical Course User Index [DS] Delton Prairie, MD     FINAL CLINICAL IMPRESSION(S) / ED DIAGNOSES   Final  diagnoses:  Metastatic malignant neoplasm, unspecified site [facility])  Gallstones     Rx / DC Orders   ED Discharge Orders     None        Note:  This document was prepared using Dragon voice recognition software and may include unintentional dictation errors.   Delton Prairie, MD 12/19/22 [phone]

## 2022-12-19 NOTE — Discharge Instructions (Signed)
Tina Ingram unfortunately has signs of metastatic cancer.  Unsure exactly what kind of primary cancer, but she has metastases in the brain, left-sided lung and her liver.  Also has gallstones  I discussed with her grandson, Tina Ingram, who would like her evaluated by hospice care and to remain DNR.

## 2022-12-19 NOTE — ED Notes (Signed)
Attempted to call [facility]

## 2022-12-19 NOTE — ED Notes (Signed)
Sister at bedside  Pt is cleaned  and dry brief applied

## 2022-12-21 DIAGNOSIS — R4182 Altered mental status, unspecified: Secondary | ICD-10-CM | POA: Diagnosis not present

## 2022-12-25 DIAGNOSIS — R634 Abnormal weight loss: Secondary | ICD-10-CM | POA: Diagnosis not present

## 2022-12-25 DIAGNOSIS — D508 Other iron deficiency anemias: Secondary | ICD-10-CM | POA: Diagnosis not present

## 2022-12-25 DIAGNOSIS — C7802 Secondary malignant neoplasm of left lung: Secondary | ICD-10-CM | POA: Diagnosis not present

## 2022-12-25 DIAGNOSIS — C7931 Secondary malignant neoplasm of brain: Secondary | ICD-10-CM | POA: Diagnosis not present

## 2022-12-25 DIAGNOSIS — N1832 Chronic kidney disease, stage 3b: Secondary | ICD-10-CM | POA: Diagnosis not present

## 2022-12-25 DIAGNOSIS — I130 Hypertensive heart and chronic kidney disease with heart failure and stage 1 through stage 4 chronic kidney disease, or unspecified chronic kidney disease: Secondary | ICD-10-CM | POA: Diagnosis not present

## 2022-12-25 DIAGNOSIS — C787 Secondary malignant neoplasm of liver and intrahepatic bile duct: Secondary | ICD-10-CM | POA: Diagnosis not present

## 2022-12-25 DIAGNOSIS — F331 Major depressive disorder, recurrent, moderate: Secondary | ICD-10-CM | POA: Diagnosis not present

## 2023-01-14 DEATH — deceased

## 2023-01-15 ENCOUNTER — Encounter: Payer: Self-pay | Admitting: Oncology

## 2023-01-15 NOTE — Telephone Encounter (Signed)
 Error

## 2023-01-19 ENCOUNTER — Telehealth: Payer: Self-pay | Admitting: Oncology

## 2023-01-19 NOTE — Telephone Encounter (Signed)
 nursing home called to cancel appts  pt passed away on 01/05/23.  Appts are canceled and noted

## 2023-02-23 ENCOUNTER — Ambulatory Visit: Payer: PPO | Admitting: Oncology

## 2023-02-23 ENCOUNTER — Other Ambulatory Visit: Payer: PPO

## 2023-03-23 ENCOUNTER — Ambulatory Visit: Payer: PPO | Admitting: Dermatology

## 2023-04-11 IMAGING — US US ABDOMEN LIMITED
1 series · 14 of 25 positions shown · non-contrast
Comparison: CT 12/11/2020

CLINICAL DATA: Right upper quadrant pain

EXAM:
ULTRASOUND ABDOMEN LIMITED RIGHT UPPER QUADRANT

[Series 1: us abdomen limited ruq (liver/gb) · 14 of 36 slices shown]
[im 1/36]
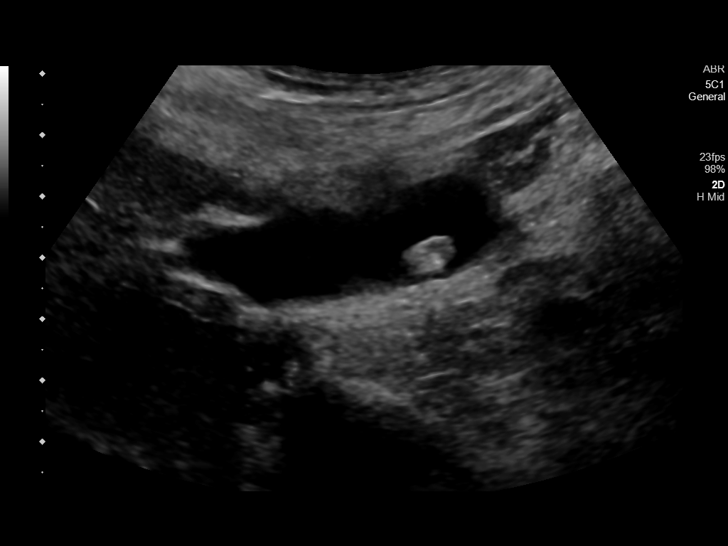
[im 3/36]
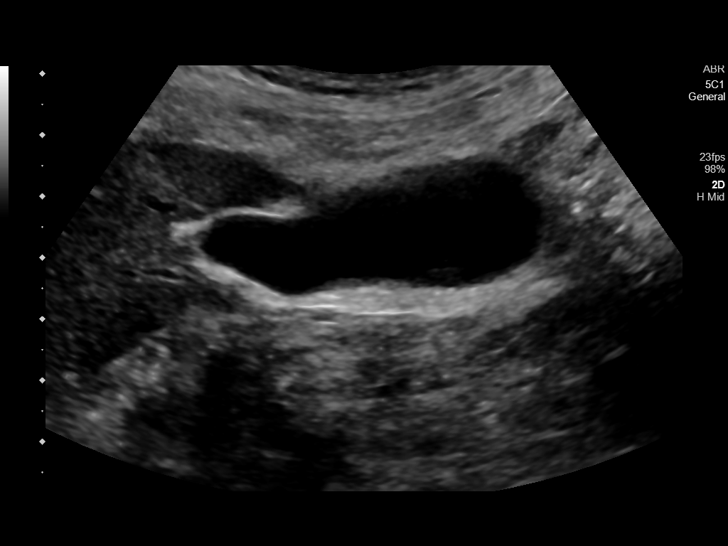
[im 6/36]
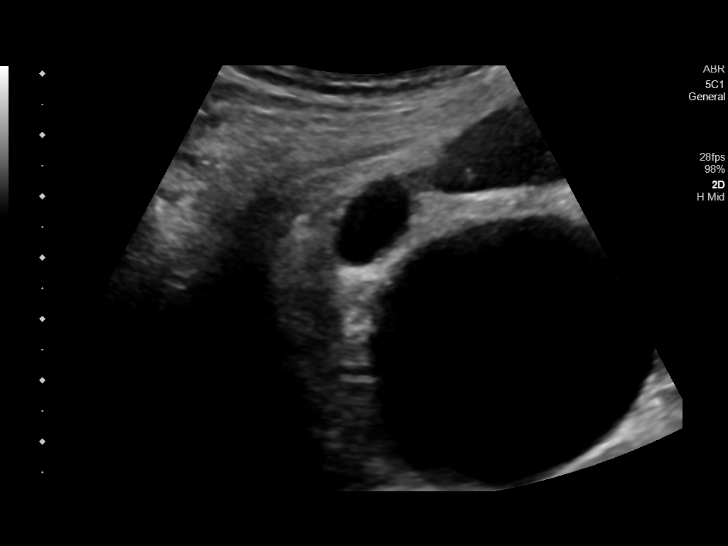
[im 9/36]
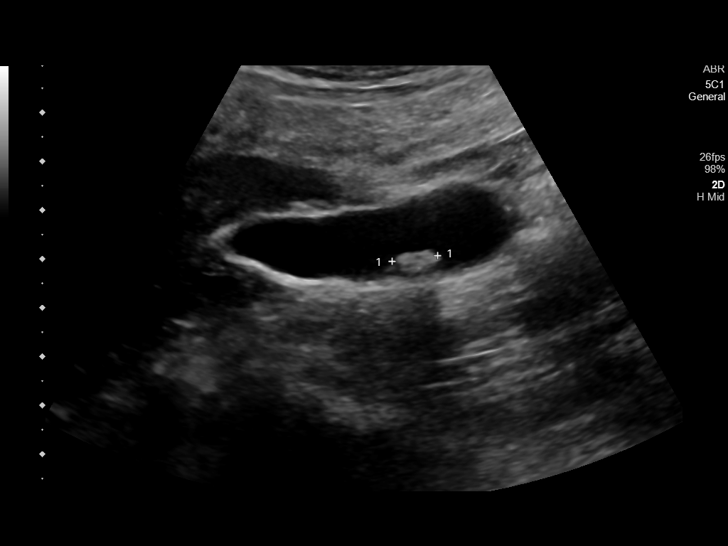
[im 12/36]
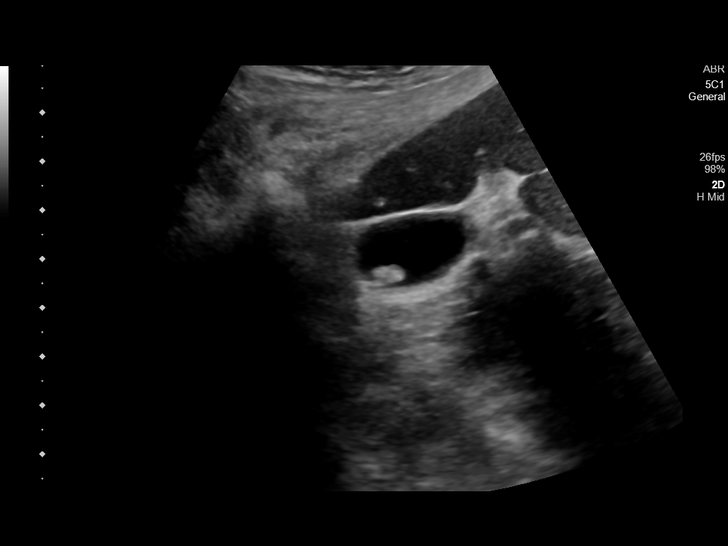
[im 14/36]
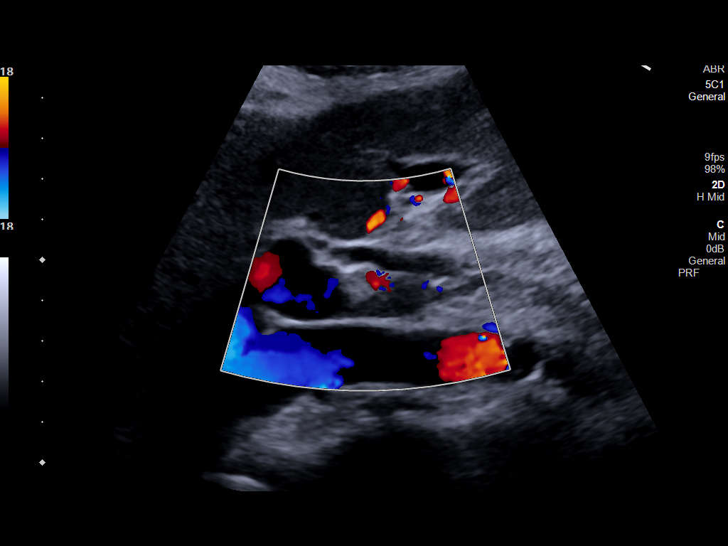
[im 17/36]
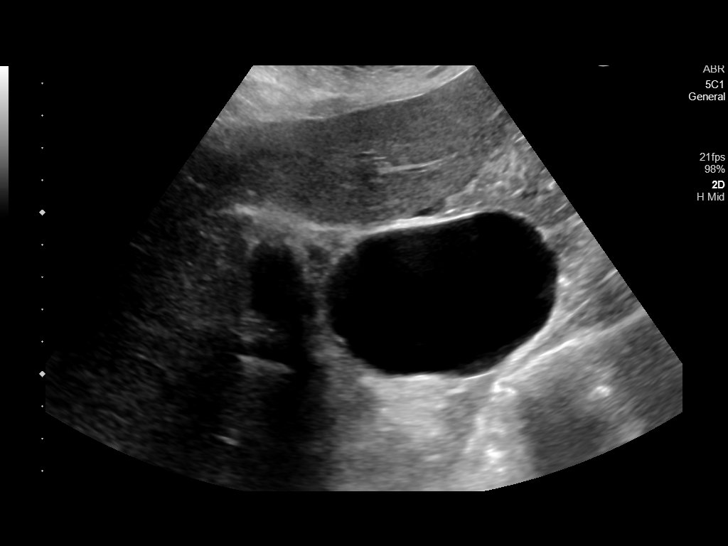
[im 19/36]
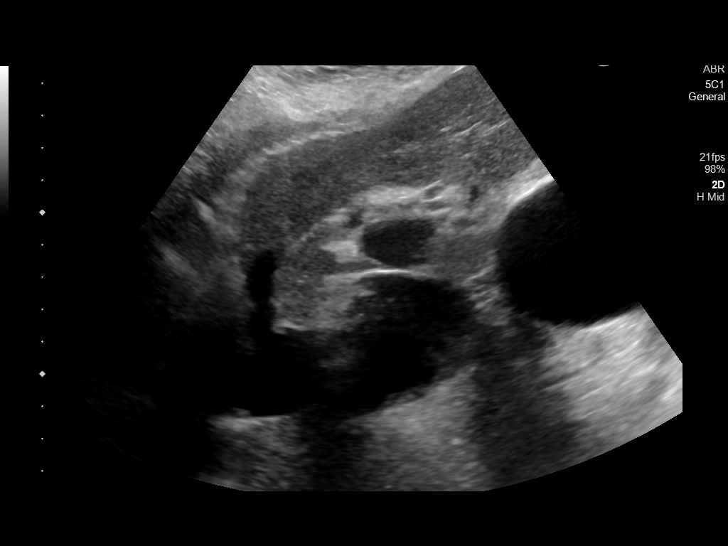
[im 22/36]
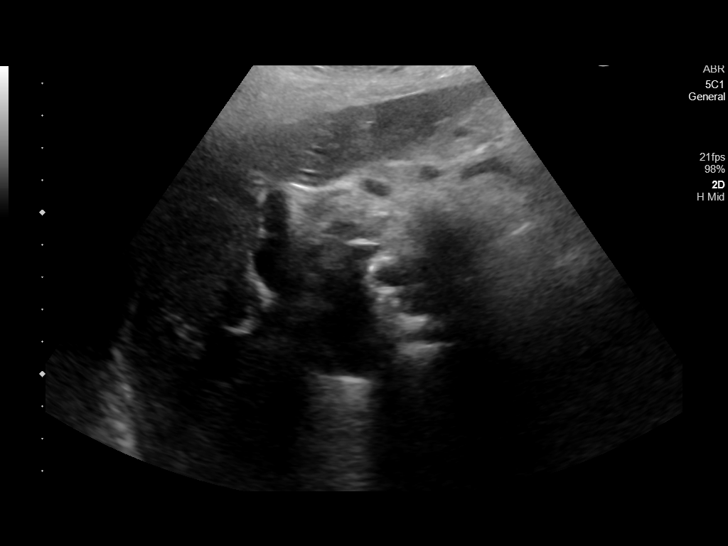
[im 24/36]
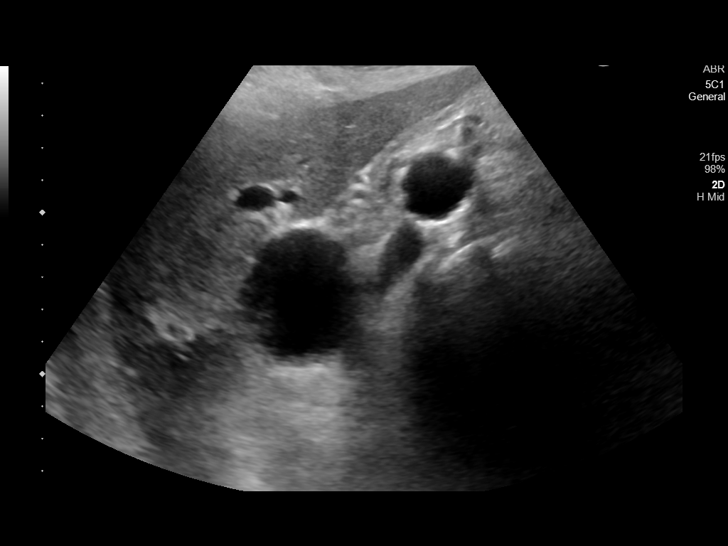
[im 27/36]
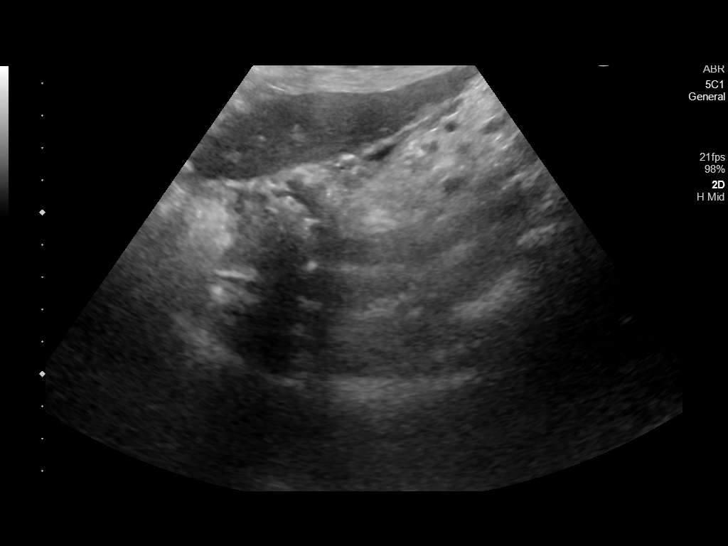
[im 30/36]
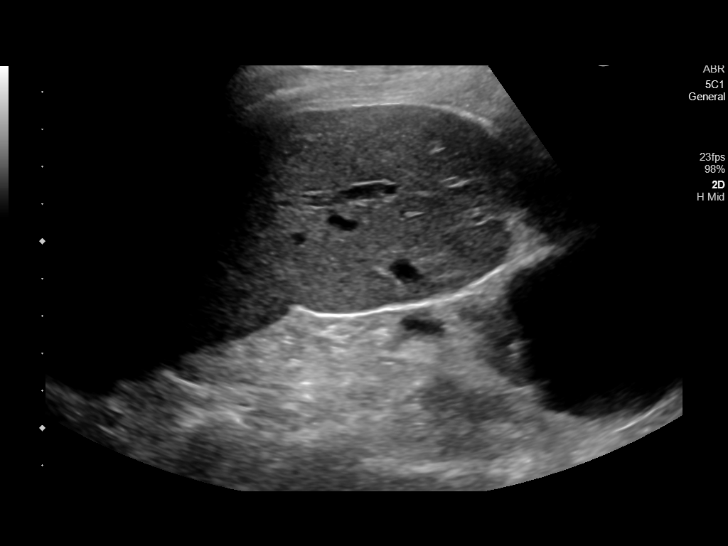
[im 33/36]
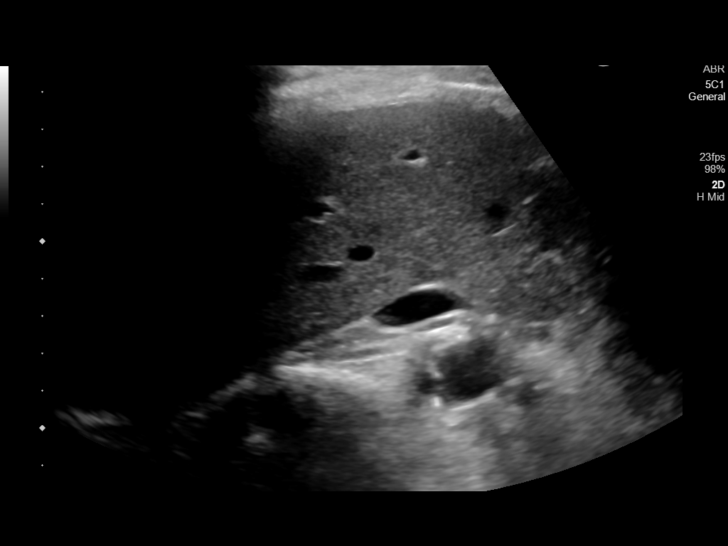
[im 36/36]
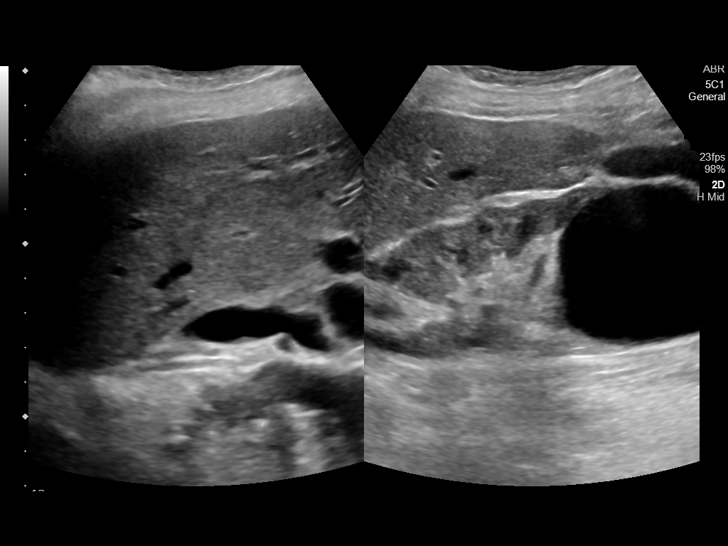

[14 of 25 positions shown; findings below may reference images not displayed]

FINDINGS: Gallbladder:

Gallstones. Normal wall thickness. No sonographic Murphy is
indicated

Common bile duct:

Diameter: 4 mm

Liver:

No focal lesion identified. Within normal limits in parenchymal
echogenicity. Portal vein is patent on color Doppler imaging with
normal direction of blood flow towards the liver.

Other: Right kidney appears slightly echogenic. Cyst at the lower
pole of the right kidney.
IMPRESSION: 1. Cholelithiasis without sonographic evidence for acute
cholecystitis or biliary dilatation
2. Echogenic right kidney suggesting medical renal disease
